# Patient Record
Sex: Female | Born: 1950
Health system: Southern US, Community
[De-identification: ages and names within clinical notes are randomized; demographics above are authoritative.]

## PROBLEM LIST (undated history)

## (undated) DIAGNOSIS — Z923 Personal history of irradiation: Secondary | ICD-10-CM

## (undated) DIAGNOSIS — F329 Major depressive disorder, single episode, unspecified: Secondary | ICD-10-CM

## (undated) DIAGNOSIS — E039 Hypothyroidism, unspecified: Secondary | ICD-10-CM

## (undated) DIAGNOSIS — R112 Nausea with vomiting, unspecified: Secondary | ICD-10-CM

## (undated) DIAGNOSIS — C801 Malignant (primary) neoplasm, unspecified: Secondary | ICD-10-CM

## (undated) DIAGNOSIS — F419 Anxiety disorder, unspecified: Secondary | ICD-10-CM

## (undated) DIAGNOSIS — Z8049 Family history of malignant neoplasm of other genital organs: Secondary | ICD-10-CM

## (undated) DIAGNOSIS — T8859XA Other complications of anesthesia, initial encounter: Secondary | ICD-10-CM

## (undated) DIAGNOSIS — K219 Gastro-esophageal reflux disease without esophagitis: Secondary | ICD-10-CM

## (undated) DIAGNOSIS — M1990 Unspecified osteoarthritis, unspecified site: Secondary | ICD-10-CM

## (undated) DIAGNOSIS — Z9889 Other specified postprocedural states: Secondary | ICD-10-CM

## (undated) DIAGNOSIS — F32A Depression, unspecified: Secondary | ICD-10-CM

## (undated) DIAGNOSIS — G5603 Carpal tunnel syndrome, bilateral upper limbs: Secondary | ICD-10-CM

## (undated) DIAGNOSIS — T4145XA Adverse effect of unspecified anesthetic, initial encounter: Secondary | ICD-10-CM

## (undated) HISTORY — DX: Family history of malignant neoplasm of other genital organs: Z80.49

## (undated) HISTORY — PX: DIAGNOSTIC LAPAROSCOPY: SUR761

## (undated) HISTORY — PX: WISDOM TOOTH EXTRACTION: SHX21

## (undated) HISTORY — PX: COLONOSCOPY: SHX174

## (undated) HISTORY — DX: Personal history of irradiation: Z92.3

## (undated) HISTORY — PX: TUBAL LIGATION: SHX77

## (undated) HISTORY — PX: DILATION AND CURETTAGE OF UTERUS: SHX78

## (undated) HISTORY — PX: KNEE SURGERY: SHX244

---

## 1997-10-06 ENCOUNTER — Encounter: Admission: RE | Admit: 1997-10-06 | Discharge: 1998-01-04 | Payer: Self-pay | Admitting: Family Medicine

## 1998-12-20 ENCOUNTER — Other Ambulatory Visit: Admission: RE | Admit: 1998-12-20 | Discharge: 1998-12-20 | Payer: Self-pay | Admitting: Family Medicine

## 2000-01-24 ENCOUNTER — Encounter: Payer: Self-pay | Admitting: Internal Medicine

## 2000-01-24 ENCOUNTER — Encounter: Admission: RE | Admit: 2000-01-24 | Discharge: 2000-01-24 | Payer: Self-pay | Admitting: Internal Medicine

## 2000-03-18 ENCOUNTER — Emergency Department (HOSPITAL_COMMUNITY): Admission: EM | Admit: 2000-03-18 | Discharge: 2000-03-18 | Payer: Self-pay | Admitting: Emergency Medicine

## 2000-04-04 ENCOUNTER — Ambulatory Visit (HOSPITAL_COMMUNITY): Admission: RE | Admit: 2000-04-04 | Discharge: 2000-04-04 | Payer: Self-pay | Admitting: Internal Medicine

## 2000-04-04 ENCOUNTER — Encounter: Payer: Self-pay | Admitting: Internal Medicine

## 2000-06-14 ENCOUNTER — Encounter (INDEPENDENT_AMBULATORY_CARE_PROVIDER_SITE_OTHER): Payer: Self-pay | Admitting: *Deleted

## 2000-06-14 ENCOUNTER — Other Ambulatory Visit: Admission: RE | Admit: 2000-06-14 | Discharge: 2000-06-14 | Payer: Self-pay | Admitting: Obstetrics and Gynecology

## 2000-06-19 ENCOUNTER — Ambulatory Visit (HOSPITAL_COMMUNITY): Admission: RE | Admit: 2000-06-19 | Discharge: 2000-06-19 | Payer: Self-pay | Admitting: Obstetrics and Gynecology

## 2000-06-19 ENCOUNTER — Encounter (INDEPENDENT_AMBULATORY_CARE_PROVIDER_SITE_OTHER): Payer: Self-pay

## 2000-07-11 ENCOUNTER — Encounter: Admission: RE | Admit: 2000-07-11 | Discharge: 2000-10-09 | Payer: Self-pay | Admitting: Obstetrics and Gynecology

## 2000-08-22 ENCOUNTER — Ambulatory Visit (HOSPITAL_COMMUNITY): Admission: RE | Admit: 2000-08-22 | Discharge: 2000-08-22 | Payer: Self-pay | Admitting: Neurosurgery

## 2000-08-22 ENCOUNTER — Encounter: Payer: Self-pay | Admitting: Neurosurgery

## 2001-10-30 ENCOUNTER — Other Ambulatory Visit: Admission: RE | Admit: 2001-10-30 | Discharge: 2001-10-30 | Payer: Self-pay | Admitting: Obstetrics and Gynecology

## 2001-11-04 ENCOUNTER — Encounter: Admission: RE | Admit: 2001-11-04 | Discharge: 2001-11-04 | Payer: Self-pay | Admitting: Obstetrics and Gynecology

## 2001-11-04 ENCOUNTER — Encounter: Payer: Self-pay | Admitting: Obstetrics and Gynecology

## 2002-06-04 ENCOUNTER — Encounter: Admission: RE | Admit: 2002-06-04 | Discharge: 2002-06-04 | Payer: Self-pay | Admitting: Internal Medicine

## 2002-06-04 ENCOUNTER — Encounter: Payer: Self-pay | Admitting: Internal Medicine

## 2003-06-09 ENCOUNTER — Emergency Department (HOSPITAL_COMMUNITY): Admission: EM | Admit: 2003-06-09 | Discharge: 2003-06-10 | Payer: Self-pay | Admitting: Emergency Medicine

## 2004-08-30 ENCOUNTER — Inpatient Hospital Stay (HOSPITAL_COMMUNITY): Admission: EM | Admit: 2004-08-30 | Discharge: 2004-08-31 | Payer: Self-pay | Admitting: Emergency Medicine

## 2005-04-15 ENCOUNTER — Inpatient Hospital Stay (HOSPITAL_COMMUNITY): Admission: AD | Admit: 2005-04-15 | Discharge: 2005-04-15 | Payer: Self-pay | Admitting: Obstetrics & Gynecology

## 2005-05-11 ENCOUNTER — Ambulatory Visit (HOSPITAL_COMMUNITY): Admission: RE | Admit: 2005-05-11 | Discharge: 2005-05-11 | Payer: Self-pay | Admitting: Obstetrics and Gynecology

## 2005-05-11 ENCOUNTER — Encounter (INDEPENDENT_AMBULATORY_CARE_PROVIDER_SITE_OTHER): Payer: Self-pay | Admitting: Specialist

## 2005-05-16 ENCOUNTER — Encounter: Admission: RE | Admit: 2005-05-16 | Discharge: 2005-05-16 | Payer: Self-pay | Admitting: Obstetrics and Gynecology

## 2005-06-27 ENCOUNTER — Other Ambulatory Visit: Admission: RE | Admit: 2005-06-27 | Discharge: 2005-06-27 | Payer: Self-pay | Admitting: Obstetrics and Gynecology

## 2005-09-15 ENCOUNTER — Inpatient Hospital Stay (HOSPITAL_COMMUNITY): Admission: EM | Admit: 2005-09-15 | Discharge: 2005-09-17 | Payer: Self-pay | Admitting: Emergency Medicine

## 2006-07-06 ENCOUNTER — Emergency Department (HOSPITAL_COMMUNITY): Admission: EM | Admit: 2006-07-06 | Discharge: 2006-07-06 | Payer: Self-pay | Admitting: Emergency Medicine

## 2006-08-12 ENCOUNTER — Encounter: Admission: RE | Admit: 2006-08-12 | Discharge: 2006-08-12 | Payer: Self-pay | Admitting: Internal Medicine

## 2007-11-12 ENCOUNTER — Encounter: Admission: RE | Admit: 2007-11-12 | Discharge: 2008-01-06 | Payer: Self-pay | Admitting: Sports Medicine

## 2007-12-09 ENCOUNTER — Ambulatory Visit (HOSPITAL_COMMUNITY): Admission: RE | Admit: 2007-12-09 | Discharge: 2007-12-09 | Payer: Self-pay | Admitting: Orthopaedic Surgery

## 2008-01-10 ENCOUNTER — Encounter: Admission: RE | Admit: 2008-01-10 | Discharge: 2008-01-10 | Payer: Self-pay | Admitting: Orthopaedic Surgery

## 2009-02-28 ENCOUNTER — Emergency Department (HOSPITAL_COMMUNITY): Admission: EM | Admit: 2009-02-28 | Discharge: 2009-02-28 | Payer: Self-pay | Admitting: Emergency Medicine

## 2009-04-20 ENCOUNTER — Other Ambulatory Visit: Admission: RE | Admit: 2009-04-20 | Discharge: 2009-04-20 | Payer: Self-pay | Admitting: Obstetrics and Gynecology

## 2010-04-16 ENCOUNTER — Encounter: Payer: Self-pay | Admitting: Orthopaedic Surgery

## 2010-08-11 NOTE — Consult Note (Signed)
NAME:  Ashley Villegas, Ashley Villegas NO.:  [id number]   MEDICAL RECORD NO.:  [id number]          PATIENT TYPE:  INP   LOCATION:  3701                         FACILITY:  [facility]   PHYSICIAN:  Meade Maw, M.D.    DATE OF BIRTH:  1950/06/07   DATE OF CONSULTATION:  08/30/2004  DATE OF DISCHARGE:                                   CONSULTATION   REFERRING PHYSICIAN:  Sharlet Salina, M.D.   REASON FOR CONSULTATION:  Chest pain.   HISTORY:  Ashley Villegas is a very pleasant 60 year old female who works at the  [employer].  While talking on the telephone she experienced a  left parasternal chest pain, described as a severe pain radiating to her  left shoulder and left neck.  The pain became persistent and severe, she had  no associated nausea or vomiting.  She called Dr. Jacqualine Code office who  recommended that she proceed to the emergency room.  Instead, she drove to  the Urgent [facility] at [facility] __________where she was given four aspirin  and one sublingual nitroglycerin.  The sublingual nitroglycerin resulted in  gradual improvement, and the total pain persisted for approximately 45  minutes.  She subsequently was transferred to the emergency room for further  evaluation.  Cardiac markers were negative, chest x-ray was negative.  It  was elected to admit the patient for further observation.  Since her  admission, she has had two further episodes of chest pain similar in nature,  but much milder in severity.  Of note, Ashley Villegas is noted to be active.  She  exercised on a treadmill for 30 minutes the day prior to her admission.  She  had no chest pain with this exertion.  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   PAST MEDICAL HISTORY:  1.  Hypothyroidism.  2.  Peripheral edema.  3.  Depression.   MEDICATIONS PRIOR TO ADMISSION:  1.  Synthroid 150 mcg daily.  2.  Effexor 150 mg daily.  3.  Hydrochlorothiazide 25 mg daily.   INPATIENT MEDICATIONS:  1.   Lopressor 25 mg daily.  2.  Lovenox 1 mg/kg.  3.  Effexor 150 mg daily.  4.  Sublingual nitroglycerin.  5.  Aspirin 325 mg daily.  6.  Hydrochlorothiazide 25 mg daily.  7.  Synthroid 200 mcg daily.   REVIEW OF SYSTEMS:  As noted above.  She does note that she does have shin  splints with excessive walking on her treadmill.   SOCIAL HISTORY:  She continues to smoke, social alcohol.  She is single.  Works at [employer] as noted above.   PHYSICAL EXAMINATION:  GENERAL:  A middle-aged female appearing older than  her stated age, currently in no pain.  VITAL SIGNS:  She is afebrile.  Blood pressure is 100/64, heart rate is 67,  weight is 160 pounds.  HEENT:  Unremarkable.  NECK:  No carotid bruits.  Good carotid upstrokes.  PULMONARY:  Breath sounds which are slightly diminished.  There is no  wheezing noted.  CARDIOVASCULAR:  Regular rate and rhythm, normal S1 and S2, no murmurs,  rubs, or gallops  noted.  PMI is not displaced.  ABDOMEN:  Soft, benign, nontender.  EXTREMITIES:  No peripheral edema.   LABORATORY DATA:  D-dimer's are less than 0.22.  Sodium is 136, potassium is  3.4, BUN is 9, creatinine is 0.8.  Normal liver enzymes.  Serial cardiac  enzymes were negative.  Her LDL is 188, triglycerides 696.  Her HDL is 42.  Her TSH is noted to be markedly elevated at 34.1.  Her serial ECG's were  reviewed.  She is noted to have a normal sinus rhythm.  There are  nonspecific ST changes noted.  There are no acute ST changes noted when the  patient is having active chest pain.  CT scan of the chest was performed.  There was no evidence of aortic dissection, there was no pulmonary embolus  noted.   IMPRESSION:  32.  A 60 year old female with significant risk factors for coronary artery      disease, including dyslipidemia, age, and chronic tobacco use.  Chest      pain is somewhat concerning for angina, in that it is possibly relieved      with nitroglycerin.  Further of note,  the patient has been involved in a      stressful exercise routine without exacerbation of her chest pain.  We      will proceed with a stress Cardiolite for further evaluation.  2.  Dyslipidemia.  In view of the patient's risk factors and age, would      shoot for a LDL of less than 130.  If she is found to have coronary      artery disease, of course, this goal will change.  Contributing to her      elevated LDL is probably her hypothyroidism.  3.  Hypothyroidism.  Agree with gradual increase in her Synthroid, but not      aggressively replacing until we have determined whether her chest pain      is related to angina.  4.  Tobacco abuse.  Smoking cessation was discussed at length with the      patient.       HP/MEDQ  D:  08/30/2004  T:  08/30/2004  Job:  [id number]

## 2010-08-11 NOTE — H&P (Signed)
NAME:  Ashley Villegas, Ashley Villegas      ACCOUNT NO.:  [id number]   MEDICAL RECORD NO.:  [id number]          PATIENT TYPE:  AMB   LOCATION:  SDC                           FACILITY:  [facility]   PHYSICIAN:  James A. Ashley Royalty, M.D.DATE OF BIRTH:  06-Apr-1950   DATE OF ADMISSION:  05/11/2005  DATE OF DISCHARGE:                                HISTORY & PHYSICAL   This is a 60 year old gravida 3, para 2, abortus 1, who was seen at my  office on April 16, 2005, for new patient examination.  She stated 5 days  ago she had a Pap done at work at [employer].  She began bleeding 1  day later and 2 days later she had bleeding considerably more than a normal  period.  She stated that she has had approximately 7 periods over the last  year.  She has not been sexually active in a number of months.  Examination  in my office on that day revealed the uterus to be approximately 10 x 7 x 6  cm, nodular, and no adnexal masses were palpable.  The patient subsequently  returned for ultrasound which reveals small uterine fibroids, the largest of  which was 1.9 cm in greatest diameter.  The adnexa were not well seen.  Sonohysterogram was performed as well and revealed a 1.2 cm echogenic focus.  It could not be determined whether the focus represented a polyp or possibly  a clot.  The examination was done with a regular pipette as well as with a  balloon pipette.  The patient was sent for diagnostic/operative hysteroscopy  and dilatation and curettage.   MEDICATIONS:  1.  Fluoxetine 20 mg daily.  2.  Synthroid 112 mcg daily.  3.  Lipitor 10 mg daily.  4.  Triamterene/hydrochlorothiazide 37.5 mg twice daily.  5.  Amitriptyline 50 mg daily.   PAST MEDICAL HISTORY:  Depression, hypothyroidism.   PAST SURGICAL HISTORY:  Cesarean section, laparoscopy x3, exploratory  laparotomy for ovarian cyst in the 1970's.   ALLERGIES:  No known drug allergies.   FAMILY HISTORY:  Noncontributory.   SOCIAL HISTORY:  The  patient denies the use of tobacco or significant  alcohol.   REVIEW OF SYSTEMS:  Noncontributory.   PHYSICAL EXAMINATION:  GENERAL:  A well-developed, well-nourished, pleasant,  white female in no acute distress.  VITAL SIGNS:  Afebrile, vital signs stable.  SKIN:  Warm and dry without lesions.  LYMPH:  There is no supraclavicular, cervical, or inguinal adenopathy.  HEENT:  Normocephalic.  NECK:  Supple without thyromegaly.  CHEST:  Lungs are clear.  HEART:  Regular rate and rhythm without murmurs, rubs, or gallops.  BREASTS:  Soft and nontender without masses.  ABDOMEN:  Soft and nontender without masses or organomegaly.  Bowel sounds  are active.  MUSCULOSKELETAL:  Full range of motion.  PELVIC:  External genitalia within normal limits.  Vagina and cervix without  gross lesions.  Bimanual examination revealed the uterus to be as mentioned  above.  Rectovaginal examination confirms.   IMPRESSION:  1.  __________ .  2.  History of depression.  3.  Hypothyroidism.  4.  Status post  cesarean section.  5.  Status post laparoscopy x3.  6.  Status post exploratory laparotomy for right ovarian cyst.  7.  Echogenic focus within the uterine cavity - differential includes polyp,      fibroid, or possibly clot.  8.  Abnormal uterine bleeding - possibly secondary to #6.  9.  Status post bilateral tubal ligation procedure.   PLAN:  Diagnostic/operative hysteroscopy, dilatation and curettage.  Risks,  benefits, complications, and alternatives were fully discussed with the  patient.  She states that she understands and accepts.  Questions invited  and answered.      James A. Ashley Royalty, M.D.  Electronically Signed     JAM/MEDQ  D:  05/11/2005  T:  05/11/2005  Job:  [id number]

## 2010-08-11 NOTE — Consult Note (Signed)
NAME:  Ashley Villegas, Ashley Villegas      ACCOUNT NO.:  [id number]   MEDICAL RECORD NO.:  [id number]            PATIENT TYPE:   LOCATION:                                 FACILITY:   PHYSICIAN:  Francisca December, M.D.       DATE OF BIRTH:   DATE OF CONSULTATION:  09/16/2005  DATE OF DISCHARGE:                                   CONSULTATION   CARDIAC CONSULTATION   REASON FOR CONSULTATION:  Chest pain.   HISTORY OF PRESENT ILLNESS:  Ms. Ashley Villegas is a 59 year old woman with  no known history of CAD who developed acute substernal chest pain followed  by diaphoresis and nausea which lasted approximately 30 minutes; this  occurred at work after a disturbing following call.  The patient was rushed  to [facility] and was pain free in the emergency room with aspirin; and  has had no recurrence.   She was hospitalized June of 2006 with similar chest discomfort. Cardiolite,  then, was negative, CT scan was negative for PE.  The pain was described as  a pressure and tightness that did not radiate.  It had the associated  symptoms as mentioned above.  Of note, she exercises regularly without chest  discomfort.   REVIEW OF SYSTEMS:  She is hypothyroid with recent adequate regulation.  She  is concerned about diffuse swelling and burning in her hands.  She does take  Lasix regularly for this.   SOCIAL HISTORY:  No tobacco or ethanol.  No illicit drug use.  She did  previously use tobacco, none currently.  She exercises regularly with her  daughter at the gym and works at [employer].   FAMILY HISTORY:  Mother died in her 12s of CHF.  Father is alive and well.   DRUG ALLERGIES:  None known.   CURRENT MEDICATIONS:  1.  Paxil 20 mg p.o. daily.  2.  Synthroid 0.112 mg.  3.  Furosemide 20 mg p.o. b.i.d.  4.  Lipitor 10 mg.  5.  Elavil 50 mg p.o. daily.  6.  Centrum and vitamin B daily.   PAST MEDICAL HISTORY:  1.  Hypothyroidism.  2.  Hyperlipidemia.  3.  Intermittent anxiety.  4.  Hypertension.  5.  Remote history of C-section.  6.  Former tobacco use.  7.  Depression.   PHYSICAL EXAMINATION:  VITAL SIGNS:  Blood pressure 124/70, pulse 71,  respiratory rate 18, temperature 98.6, 95% saturation on room air.  GENERAL:  This is a pleasant 60 year old woman, mildly obese.  No distress.  HEENT:  Unremarkable.  NECK:  Supple without thyromegaly or masses.  The carotid upstrokes are  normal.  There is no bruit.  There is no JVD. CHEST:  Her chest is clear  with adequate excursion bilaterally.  HEART:  The heart has regular rhythm.  Normal S1-S2.  The chest wall is  nontender.  ABDOMEN:  Soft and nontender without hepatosplenomegaly or midline pulsatile  mass.  Bowel sounds are present throughout.  EXTERNAL GENITALIA:  Without lesions.  RECTAL:  Not performed.  EXTREMITIES:  Show full range of motion.  No edema.  Intact distal  pulses.  NEUROLOGICAL:  Cranial nerves II-XII intact.  Motor and sensory grossly  intact.  Gait not tested.  SKIN:  Warm, dry and clear.   ACCESSORY CLINICAL DATA:  LDL cholesterol of 97.  Initial potassium was 2.8  now repleted to 3.1.  Admission hemogram is normal.  Renal function is  normal.  D-dimer is less than 0.22.  CK/MB and troponin have been negative  x3.   ELECTROCARDIOGRAM:  Normal sinus rhythm and normal.   CHEST X-RAY:  No active cardiopulmonary disease.   ASSESSMENT:  1.  A single episode of chest pain associated with emotional stress, none on      exertion.  2.  Hypothyroidism.  3.  Hyperlipidemia well-controlled.  4.  Hypertension.  5.  Anxiety, chronic.  6.  Long-term medication use.  7.  Arthralgias and diffuse edema, etiology uncertain.  8.  Hypokalemia, diuretic induced.   PLAN:  1.  Repeat potassium as you are doing.  2.  Aspirin daily.  3.  Schedule for diagnostic study, cardiac catheterization versus exercise      Myoview.  Since she had a normal study 1 year ago, consideration for      catheterization  may be reasonable.  Will leave this up to Dr. Fraser Din.      She will return tomorrow.  Will keep her n.p.o. and tentatively schedule      a Myoview at this point.      Francisca December, M.D.  Electronically Signed     JHE/MEDQ  D:  09/16/2005  T:  09/17/2005  Job:  [id number]   cc:   Sharlet Salina, M.D.  Fax: [phone]

## 2010-08-11 NOTE — Discharge Summary (Signed)
NAME:  Ashley Villegas, Ashley Villegas      ACCOUNT NO.:  [id number]   MEDICAL RECORD NO.:  [id number]          PATIENT TYPE:  INP   LOCATION:  3701                         FACILITY:  [facility]   PHYSICIAN:  Deirdre Peer. Polite, M.D. DATE OF BIRTH:  [birth year]   DATE OF ADMISSION:  08/29/2004  DATE OF DISCHARGE:  08/31/2004                                 DISCHARGE SUMMARY   DISCHARGE DIAGNOSIS:  1.  Chest pain, cardiac enzymes negative for ischemia, CT negative for      pulmonary embolus, did show mild peribronchial thickening, Cardiolite      stress test ejection fraction within normal limits, no ischemia.  2.  Hypothyroidism, please note TSH is elevated, patient states compliance      to medications, therefore, suggesting may need increase in her      medications, Synthroid increased to 200 mcg.  3.  History of depression.  4.  Tobacco use, recommend stop.  5.  Hypercholesterolemia.   DISCHARGE MEDICATIONS:  The patient is asked to continue her home  medications which includes Effexor, hydrochlorothiazide 25 mg, Synthroid  increased to 200 mcg.   DISPOSITION:  The patient is being discharged to home in stable condition,  asked to follow up with primary MD in approximately one week.   CONSULTATIONS:  [facility].   STUDIES:  CT of the chest negative for PE.  EKG revealed normal sinus  rhythm, low voltage, no evidence of ischemia.  CBC within normal limits.  Lipid panel with cholesterol 271, LDL 191, triglycerides 176.  Cardiolite  stress test was reported without ischemia.  Chest x-ray showed no active  disease.  TSH 34.   DISCHARGE MEDICATIONS:  Lipitor 10 mg p.o. daily.   HISTORY OF PRESENT ILLNESS:  60 year old female with above medical problem  experienced chest pain at work.  The patient was seen at [facility],  given nitroglycerin, reportedly did have some relief of her chest pain, the  patient was sent to the ED for further evaluation.  In the ED, the patient  was  evaluated, admission was deemed necessary for further evaluation and  treatment.  Please note the patient had EKG and point of care enzymes within  normal limits.  Please see dictated H&P for further details.   PAST MEDICAL HISTORY:  As stated above.   MEDICATIONS:  Stated on admission H&P.   SOCIAL HISTORY:  Per admission H&P.   PAST SURGICAL HISTORY:  None.   HOSPITAL COURSE:  The patient was admitted to a telemetry bed for evaluation  and treatment of chest pain.  The patient did admit to having some relief  after receiving sublingual nitroglycerin.  The patient was placed on  aspirin, beta blocker, as well as Lovenox.  A cardiology consultation was  obtained.  In the interim, the patient had CT scan of the chest which was  negative for PE.  Also, the patient's EKG was without acute abnormalities.  Serial enzymes were ordered and were negative for ischemia.  The patient  ultimately underwent Cardiolite stress test reported as normal without signs  of ischemia.  At this time, the patient is pain free without symptoms and is  stable for discharge.  As stated, further testing has been ordered.  The  patient has a TSH that is elevated, Synthroid has been increased to 200 mcg.  She is asked to follow up with her primary MD for further monitoring and  adjustment of medicines.  The patient also had lipids ordered that showed  elevated cholesterol, the patient will be started on Lipitor.  The patient  is asked to follow up with primary MD for further management of newly  diagnosed hypercholesterolemia.  The patient has been counseled on her need  to stop smoking and, again, recommends further outpatient follow up.  At  this time, the patient is medically stable for discharge.       RDP/MEDQ  D:  08/31/2004  T:  08/31/2004  Job:  [id number]

## 2010-08-11 NOTE — H&P (Signed)
NAME:  Ashley Villegas, Ashley Villegas      ACCOUNT NO.:  [id number]   MEDICAL RECORD NO.:  [id number]           PATIENT TYPE:   LOCATION:                               FACILITY:  [facility]   PHYSICIAN:  Michelene Gardener, MD         DATE OF BIRTH:   DATE OF ADMISSION:  09/15/2005  DATE OF DISCHARGE:                                HISTORY & PHYSICAL   PRIMARY CARE PHYSICIAN:  The patient is normally followed at the [facility]  group.   CHIEF COMPLAINT:  Chest pain.   HISTORY OF PRESENT ILLNESS:  This is a 60 year old female with past medical  history of multiple problems who presented with the above mentioned  complaint.  The patient stated that she was at work today when she started  developing left-sided chest pain described as pressure-like, was 10/10,  radiating to her left shoulder and associated with nausea and sweating, mild  shortness of breath.  This lasted around 25-30 minutes and then resolved by  itself when she was already in the ER.  In the ER she is chest pain free.  The patient stated that she had too many episodes of chest pain which last  between 10-15 minutes and they resolve by themselves every time.  She had  stress test done around one year ago and came to be normal.  Currently she  is chest pain free.   PAST MEDICAL HISTORY:  1. Significant for hypertension.  2. Hypothyroidism.  3. Hypercholesterolemia.  4. Anxiety.  5. Anemia.   PAST SURGICAL HISTORY:  History of cesarean section.   MEDICATIONS:  1. Fluoxetine 20 mg p.o. once daily.  2. Synthroid 112 mcg p.o. once daily.  3. Triamterene/hydrochlorothiazide 7.5/25 mg once daily.  4. Lipitor 10 mg p.o. once daily.  5. Amitriptyline 50 mg p.o. once daily.  6. Vitamin B12.  7. Lasix 20 mg p.o. twice daily.   ALLERGIES:  No known drug allergies.   SOCIAL HISTORY:  The patient denies smoking, denies alcohol drinking, and  denies history of recreational drugs.   FAMILY HISTORY:  Her mother died at the age of 46 with  congestive heart  failure and her father is still alive and he had only history of phlebitis.   REVIEW OF SYSTEMS:  Positive for shortness of breath, chest pain, nausea,  and sweating.  The rest of the systems were reviewed and they were negative.   PHYSICAL EXAMINATION:  VITAL SIGNS:  Temperature is 97.8, blood pressure is  115/76, pulse 74, respiratory rate 20.  GENERAL APPEARANCE:  This is a middle-aged, Caucasian female, laying down in  bed, no signs of acute distress at the present time.  HEENT:  Conjunctivae are normal.  There is no bilateral erythema.  Pupils  are equal, round, and reactive to light and accommodation.  There is no  ptosis.  Hearing is intact.  There is no ear discharge or infection.  There  is no nose discharge, infection, or  bleeding.  Oral mucosa is moist and  there is no pharyngeal erythema.  NECK:  Supple.  No JVD, no carotid bruit.  No lymphadenopathy seen.  No  thyroid enlargement or thyroid tenderness.  CARDIOVASCULAR:  S1/S2 are heard.  No additional heart sounds.  No murmurs,  no gallops, and no thrills.  RESPIRATORY:  The patient is breathing between 16-18.  No use of accessory  muscles found.  No intercostal retractions.  No numbness.  No rales or  rhonchi.  No wheezes.  ABDOMEN:  Soft, nondistended.  No tenderness.  No hepatosplenomegaly.  Bowel  sounds are normal.  Umbilicus is central.  EXTREMITIES:  Lower extremities:  No rash, and no varicose veins.  There is positive edema which is +1.  SKIN:  No rash, no erythema.  NEUROLOGIC:  Cranial nerves are intact for II-XII.  No motor or sensory  deficits.   LABORATORY DATA:  WBC 7.3, hemoglobin 15.3, hematocrit 38.4, MCV is 91.8,  platelet count is 325.  Sodium 139, potassium 2.8, chloride 101, BUN 11,  glucose 103.   EKG showed no signs of acute ischemia.   ASSESSMENT:  1. Stable angina.  This patient has recurrent chest pain that increases      with exertion and normally resolves by rest.   Will admit her to      telemetry.  To get three sets of troponin and cardiac enzymes.  Will      also follow serial EKGs.  Will start on aspirin, beta blockers, ACE      inhibitors, nitroglycerin, and Lovenox.  Will also get echocardiogram      because of because of chronic lower extremity edema for which she is      taking Lasix but never diagnosed with congestive heart failure.  We      will get cardiology consultation for further evaluation.  2. Hypokalemia.  Will give [facility] and order baseline basic metabolic      panel.  3. Hypertension.  Will continue her current medications.  4. Hypercholesterolemia.  Will continue her current medications and will      get lipid profile.  5. Anxiety.  I will continue her current medications.  6. Hypothyroidism.  Will continue her Synthroid and will get TSH level.      Michelene Gardener, MD  Electronically Signed     NAE/MEDQ  D:  09/15/2005  T:  09/16/2005  Job:  [id number]

## 2010-08-11 NOTE — H&P (Signed)
NAME:  Ashley Villegas      ACCOUNT NO.:  [id number]   MEDICAL RECORD NO.:  [id number]          PATIENT TYPE:  INP   LOCATION:                               FACILITY:  [facility]   PHYSICIAN:  Sherin Quarry, MD      DATE OF BIRTH:  1950-07-31   DATE OF ADMISSION:  08/30/2004  DATE OF DISCHARGE:                                HISTORY & PHYSICAL   HISTORY OF THE PRESENT ILLNESS:  Ashley Villegas is a 60 year old  lady who states that she was at work at the [employer] at  about 3 P.M. this afternoon when while talking on the phone she began to  experience a left parasternal chest pain described as a severe ache, which  seemed to radiate to her shoulder.  She stood up, but the pain did not seem  to improve.  She waited a while and did not noticed any breathing  difficulty, sweating, nausea or vomiting. Eventually, when the pain did not  improve she got into her car and drove to the new [facility] where she was given four aspirins and one sublingual nitroglycerin.  After she got the sublingual nitroglycerin she noticed a gradual improvement  in the chest pain.  She estimates that the pain lasted about 45 minutes.  She was urged to come to the emergency room for further evaluation.   On arrival to the emergency room an electrocardiogram was obtained, which  was within normal limits.  Chest x-ray was normal and point of care cardiac  enzymes were negative.  After evaluating her the decision was made to admit  her at this time for further investigation of the prolonged episode of chest  pain.   MEDICATIONS:  1.  Synthroid 150 mcg daily.  2.  Effexor 150 mg daily.  3.  Hydrochlorothiazide 25 mg daily.  4.  The patient also states she takes a medicine at night for her      circulation; she is not sure what this is.   ALLERGIES:  The patient states there are no known drug allergies.   PAST MEDICAL HISTORY:  Medical illnesses include:  1.  In  the 1980s she was diagnosed with hypothyroidism and has taken      Synthroid since that time.  Dr. Marny Lowenstein periodically monitors her TSH.      She has had no recent problems with heat or cold intolerance, skin or      hair changes.  2.  Mild peripheral edema.  The patient states she takes hydrochlorothiazide      25 mg daily, she thinks this is not because of hypertension.  3.  Depression.  The patient is on Effexor chronically for these symptoms.      She apparently has some difficulty with insomnia.   PAST SURGICAL HISTORY:  Operations are none, except for routine childbirth.   FAMILY HISTORY:  The patient's mother died of congestive heart failure at  age 106.  Her father has a history of hypertension and phlebitis.  One of her  brothers apparently has celiac SPRUE.   SOCIAL HISTORY:  The patient smoked about  1/2 pack of cigarettes per day  until a few months ago.  She had not drank alcohol or used drugs.  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   REVIEW OF SYSTEMS:  HEENT:  Head; she has had a throbbing head today.  Ears,  nose and throat; denies earaches, sinus pain or sore throat.  RESPIRATORY:  Denies coughing, wheezing or chest congestion.  CARDIOVASCULAR: Denies  orthopnea, PND and ankle edema.  GASTROINTESTINAL:  Denies nausea, vomiting,  indigestion or dysphagia.  GENITOURINARY:  Denies dysuria and urinary  frequency.  NEUROLOGIC:  No history of seizure or stroke. ENDOCRINE:  Denies  excessive thirst, urinary frequency and nocturia.   PHYSICAL EXAMINATION:  GENERAL APPEARANCE:  On physical exam she is an alert  and cooperative lady who is currently in no acute distress.  VITAL SIGNS:  The patient's blood pressure is 112/65, pulse 75 and  respirations 20.  O2 saturation 98%.  HEENT:  The head, eyes, ears, nose and throat exam is within normal limits.  CHEST:  The chest is clear.  BACK:  Examination of the back reveals no CVA or  point tenderness.  HEART:  Cardiovascular exam reveals normal S1 and S2 without rubs, murmurs  or gallops.  ABDOMEN:  The abdomen is benign.  On deep palpation in the epigastric and  right upper quadrant areas produces no tenderness.  NEUROLOGIC EXAMINATION:  On neurologic testing examination of the  extremities is normal.   IMPRESSION:  1.  Prolonged episode of chest pain.  Risk factors include smoking history. I am not sure about the status of her  cholesterol level.  There is some family history of heart disease, although  at an advanced age.  It seems prudent to me to admit this patient for  further evaluation.   We will obtain serial enzymes and cardiology evaluation.  I am also going to  obtain a computerized tomographic scan of the chest.   1.  Twenty pack/year smoking history.  2.  Hypothyroidism.  3.  Anxiety.       ___________________________________________  Sherin Quarry, MD    SY/MEDQ  D:  08/29/2004  T:  08/30/2004  Job:  [id number]   cc:   Sharlet Salina, M.D.  [address]  [place]  [place] [address]  Fax: [phone]

## 2010-08-11 NOTE — Op Note (Signed)
NAME:  Ashley Villegas, Ashley Villegas      ACCOUNT NO.:  [id number]   MEDICAL RECORD NO.:  [id number]          PATIENT TYPE:  AMB   LOCATION:  SDC                           FACILITY:  [facility]   PHYSICIAN:  James A. Ashley Royalty, M.D.DATE OF BIRTH:  [birth year]   DATE OF PROCEDURE:  05/11/2005  DATE OF DISCHARGE:                                 OPERATIVE REPORT   PREOPERATIVE DIAGNOSES:  1.  Possible endometrial polyp versus fibroid.  2.  Abnormal uterine bleeding.   POSTOPERATIVE DIAGNOSES:  1.  Possible endometrial polyp versus fibroid.  2.  Abnormal uterine bleeding.  3.  Pathology pending.   PROCEDURES:  1.  Diagnosis/operative hysteroscopy.  2.  Dilatation and curettage.   SURGEON:  Rudy Jew. Ashley Royalty, M.D..   ANESTHESIA:  General.   ESTIMATED BLOOD LOSS:  Less than 25 mL.   COMPLICATIONS:  None.   PACKS AND DRAINS:  None.   PROCEDURE:  The patient was taken to the operating room and placed in the  dorsal supine position.  After general anesthesia was administered, she was  placed in the lithotomy position and  prepped and draped in the usual manner  for vaginal surgery.  A posterior weighted retractor was placed per vagina.  The anterior lip of the cervix was grasped with single-tooth tenaculum.  The  uterus was sounded to 9 cm and noted to be anteverted.  The cervix was then  serially dilated to a size 29-French using Shawnie Pons dilators.  The resectoscope  was then placed into the uterine cavity using sorbitol as a distension  medium.  The endometrial cavity was thoroughly inspected.  The left and  right tubal ostia were identified and appropriately photographed.   The anterior and posterior surfaces of the uterine cavity were thoroughly  inspected.  There was a small polyp arising from the sidewall, which was  removed with the resectoscope using 50 watts, cutting waveform.  It was  submitted separately to pathology for histologic studies.  It could not be  ascertained with certainty  whether it was a strip of endometrium or whether  was a bona fide polyp.  I could appreciate no other submucosal fibroids or  separate polyps.   Attention was then to turned the uterine curettage.  The resectoscope was  removed and a medium-size curette introduced.  First a four-quadrant  curettage was performed.  Then a therapeutic curettage was performed.  All  curettings were submitted pathology for histologic studies.  The vaginal  instruments were removed and hemostasis was noted.  The procedure was  terminated.   The patient was then taken to the recovery room in excellent condition.      James A. Ashley Royalty, M.D.  Electronically Signed     JAM/MEDQ  D:  05/11/2005  T:  05/12/2005  Job:  [id number]

## 2010-08-11 NOTE — Discharge Summary (Signed)
NAME:  Ashley, Villegas NO.:  [id number]   MEDICAL RECORD NO.:  [id number]          PATIENT TYPE:  INP   LOCATION:  4731                         FACILITY:  [facility]   PHYSICIAN:  Sherin Quarry, MD      DATE OF BIRTH:  06-03-1950   DATE OF ADMISSION:  09/15/2005  DATE OF DISCHARGE:  09/17/2005                                 DISCHARGE SUMMARY   Ashley Villegas is a 60 year old lady who was initially seen in [facility] emergency room on June 23 by Dr. Arthor Captain.  At that time the  patient reported that while at work she began to experience left-sided chest  pain described as a pressure sensation, which seemed to radiate to the left  shoulder and was associated with nausea, diaphoresis and mild dyspnea.  The  pain lasted 25-30 minutes and then gradually resolved.  While being  evaluated in the emergency room, she experienced resolution of pain.  The  patient has a past history of recurrent episodes of chest pain and has had a  stress test the past that was done 1 year ago.   PAST MEDICAL HISTORY:  1.  History of hypertension.  2.  Hypothyroidism.  3.  Hyperlipidemia.   PHYSICAL EXAMINATION:  Physical exam at time of admission as described by  Dr. Arthor Captain:   VITAL SIGNS:  The blood pressure was 115/76, pulse 74, respirations 20.  HEENT:  Within normal limits.  CHEST:  Clear.  There was no wheezing or rales.  No rhonchi.  CARDIOVASCULAR:  Normal S1 and S2 without rubs, murmurs or gallops.  ABDOMEN:  Benign.  There are normal bowel sounds.  There were no masses or  tenderness.  No guarding or rebound.  NEUROLOGIC:  Neurologic testing was within normal limits.  EXTREMITIES:  1+ pretibial edema.   Relevant laboratory studies obtained included a D-dimer which was negative.  CBC showed a white count of 7300, hemoglobin 13.2.  The sodium was 139,  potassium was 2.8 consistent with her history of diuretic use.  Glucose was  103, BUN was 11.  Serial cardiac  markers were negative.  Lipid profile  showed a cholesterol of 171 with an LDL of 97, HDL of 37.  TSH was normal.   On admission a consultation was obtained from Dr. Amil Amen of [facility].  Dr. Amil Amen recommended proceeding with an exercise Cardiolite  study.  This was done on June 25 and the Cardiolite study was completely  normal, showing an ejection fraction estimated to be about 72% with no  ischemic abnormalities.  On admission the patient was started on potassium  replacement and she was given K-Dur at a dose of 20 mEq t.i.d.  The  potassium level was 3.1 as of June 25.  On June 25 the patient was  discharged.   DISCHARGE DIAGNOSES:  1.  Noncardiac chest pain.  2.  Hypokalemia.  3.  Hypertension.  4.  Hyperlipidemia.  5.  Hypothyroidism.   DISCHARGE MEDICATIONS:  The patient will continue her usual home medicines,  which consist of:   1.  Synthroid 112 mcg daily.  2.  Lasix 20 mg  daily.  3.  Dyazide 37.5 mg daily.  4.  Lipitor 10 mg daily.  5.  Amitriptyline 50 mg daily.  6.  Centrum and vitamin B one tablet daily.  7.  In addition, the patient will take a potassium supplement in the form of      K-Dur 20 mEq b.i.d.   The patient was advised to contact her primary doctor for follow-up  electrolyte determinations in 7-10 days.  She indicated that she would not  be seeing Dr. Marny Lowenstein in the future because Dr. Marny Lowenstein is leaving the  practice, and therefore she plans to see Dr. Olena Leatherwood at [facility] at [facility].  I told her thought that this would be a good plan  and encouraged her to see Dr. Olena Leatherwood in that time interval for follow-up  potassium determination.           ______________________________  Sherin Quarry, MD     SY/MEDQ  D:  09/17/2005  T:  09/18/2005  Job:  [id number]   cc:   Meade Maw, M.D.  Fax: [phone]   Sharlet Salina, M.D.  Fax: [phone]   Ladell Pier, M.D.  Fax: [phone]

## 2010-08-11 NOTE — H&P (Signed)
[facility]  Patient:    Ashley Villegas, Ashley Villegas)      MRN: [id number] Attending:  Rudy Jew. Ashley Royalty, M.D.                         History and Physical                                Patient is a 60 year old gravida 3, para 2, AB1 referred through the courtesy of Dr. Raechel Chute at [facility] for evaluation of abnormal uterine bleeding.  She has been having abnormal uterine bleeding and intramenstrual bleeding since the fall of 2001. Sonohysterogram and endometrial biopsy were performed at my office on June 14, 2000.  The sonohysterogram revealed a fibroid uterus and a 3.8 cm intrauterine mass, presumably a polyp versus fibroid.  Endometrial biopsy revealed benign secretory endometrium.  Patient is hence for diagnostic/operative hysteroscopy and dilatation and curettage.  MEDICATIONS:                  1. Synthroid 0.125 mg q.d.                               2. Efferon (antidepressant).  PAST MEDICAL HISTORY:         1. Depression treated by Elna Breslow, M.D.                               2. Hypothyroidism.  PAST SURGICAL HISTORY:        Cesarean section, exploratory laparotomy for right ovarian cyst (1970s), laparoscopy x 3 without documented evidence of endometriosis.  ALLERGIES:                    None.  FAMILY HISTORY:               Noncontributory.  SOCIAL HISTORY:               Patient denies use of tobacco or significant alcohol.  REVIEW OF SYSTEMS:            Noncontributory.  PHYSICAL EXAMINATION  GENERAL:                      Well-developed, well-nourished, pleasant white female in no acute distress.  VITAL SIGNS:                  Afebrile.  Vital signs stable.  SKIN:                         Warm and dry without lesions.  LYMPH:                        There is no supraclavicular, cervical, or inguinal adenopathy.  HEENT:                        Normocephalic.  NECK:                          Supple without thyromegaly.  CHEST:                        Lungs are  clear.  CARDIAC:                      Regular rate and rhythm without murmurs, gallops, or rubs.  BREASTS:                      Deferred.  ABDOMEN:                      Soft, nontender without mass or organomegaly. Bowel sounds are active.  MUSCULOSKELETAL:              No CVA tenderness.  PELVIC:                       Deferred until examination under anesthesia.  IMPRESSION:                   1. Intrauterine polyp versus fibroid on                                  sonohysterogram.                               2. Abnormal uterine bleeding probably secondary                                  to #1.                               3. Status post BTSP.                               4. Hypothyroidism.                               5. History of depression.  PLAN:                         1. Diagnostic/operative hysteroscopy.                               2. Dilatation and curettage.  Risks, benefits,                                  complications, and alternatives fully                                  discussed with the patient.  She states she                                  understands and accepts.  Questions invited                                  and answered.  Will schedule. DD:  06/19/00 TD:  06/19/00 Job: [id number]

## 2010-08-11 NOTE — Op Note (Signed)
[facility]  Patient:    Ashley Villegas, Ashley Villegas             MRN: [id number] Proc. Date: 06/19/00 Adm. Date:  [id number] Attending:  Wandalee Ferdinand                           Operative Report  PREOPERATIVE DIAGNOSIS:       1. Intrauterine polyp versus fibroid at                                  sonohistogram.                               2. Abnormal uterine bleeding.  POSTOPERATIVE DIAGNOSIS:      1. No gross polyp or fibroid noticed at                                  hysteroscopy.                               2. Pathology pending.  OPERATION:                    1. Diagnostic/operative hysteroscopy.                               2. Dilatation and curettage.  SURGEON:                      Rudy Jew. Ashley Royalty, M.D.  ANESTHESIA:                   General.  ESTIMATED BLOOD LOSS:         50 cc.  COMPLICATIONS:                None.  PACKS AND DRAINS:             None.  DESCRIPTION OF PROCEDURE:     The patient was taken to the operating room and placed in the dorsal supine position.  After adequate general anesthesia was administered, she was placed in the lithotomy position and prepped and draped in the usual manner for vaginal surgery.  Posterior weighted retractor was placed per vagina, and the lip of the cervix grasped with a single-tooth tenaculum.  The uterus was gently sounded to approximately 9 cm and noted to be anteverted.  The cervix was then serially dilated to a size 29 [place] using [employer] dilators.  The resectoscope was placed into the uterine cavity using sorbitol as a distention medium.  The endometrial cavity was carefully surveyed.  The endometrial lining appeared to be rather thick, and both tubal ostia were seen and photographed appropriately.  Other than the endometrial lining being rather lush, I could appreciate no significant size mass in the uterine cavity which was in contrast to the findings at sonohistogram.   There was a small apparent polyp versus strip of pedunculated endometrium that was removed separately and submitted to pathology for histologic studies. Appropriate photos were obtained.  At this point, attention was turned to the uterine curettage.  A four-quadrant technique was used.  At  the end, a therapeutic technique was used.  A large amount of tissue was submitted to pathology for histologic studies.  The hysteroscope was once again placed into the uterine cavity.  There were no other abnormalities noted.  Hemostasis was noted to be good.  At this point, the vaginal instruments were removed.  Hemostasis was noted and the procedure terminated.  It should be mentioned that, while the patient was under anesthesia and approximately two-thirds of the way through the case, I was informed that the type and screen reported on the patient was not valid and, if we wanted another, it would need to be redrawn.  Since the case was essentially two-thirds completed, I decided against ordering another type and screen, and at the conclusion of the procedure, hemostasis was excellent.  The patient was taken to the recovery room in excellent condition. DD:  06/19/00 TD:  06/20/00 Job: [id number]

## 2010-12-18 ENCOUNTER — Other Ambulatory Visit: Payer: Self-pay | Admitting: Internal Medicine

## 2010-12-18 DIAGNOSIS — Z1231 Encounter for screening mammogram for malignant neoplasm of breast: Secondary | ICD-10-CM

## 2010-12-22 ENCOUNTER — Ambulatory Visit
Admission: RE | Admit: 2010-12-22 | Discharge: 2010-12-22 | Disposition: A | Discharge status: Home / Self Care | Payer: Managed Care, Other (non HMO) | Source: Ambulatory Visit | Attending: Internal Medicine | Admitting: Internal Medicine

## 2010-12-22 ENCOUNTER — Other Ambulatory Visit: Payer: Self-pay | Admitting: Internal Medicine

## 2010-12-22 DIAGNOSIS — M79629 Pain in unspecified upper arm: Secondary | ICD-10-CM

## 2010-12-22 DIAGNOSIS — Z1231 Encounter for screening mammogram for malignant neoplasm of breast: Secondary | ICD-10-CM

## 2011-01-08 ENCOUNTER — Other Ambulatory Visit: Payer: Self-pay | Admitting: Internal Medicine

## 2011-01-08 ENCOUNTER — Ambulatory Visit
Admission: RE | Admit: 2011-01-08 | Discharge: 2011-01-08 | Disposition: A | Discharge status: Home / Self Care | Payer: Managed Care, Other (non HMO) | Source: Ambulatory Visit | Attending: Internal Medicine | Admitting: Internal Medicine

## 2011-01-08 DIAGNOSIS — M79629 Pain in unspecified upper arm: Secondary | ICD-10-CM

## 2013-01-12 ENCOUNTER — Other Ambulatory Visit (HOSPITAL_COMMUNITY)
Admission: RE | Admit: 2013-01-12 | Discharge: 2013-01-12 | Disposition: A | Discharge status: Home / Self Care | Payer: Managed Care, Other (non HMO) | Source: Ambulatory Visit | Attending: Internal Medicine | Admitting: Internal Medicine

## 2013-01-12 ENCOUNTER — Other Ambulatory Visit: Payer: Self-pay | Admitting: Internal Medicine

## 2013-01-12 DIAGNOSIS — Z1151 Encounter for screening for human papillomavirus (HPV): Secondary | ICD-10-CM | POA: Insufficient documentation

## 2013-01-12 DIAGNOSIS — Z01419 Encounter for gynecological examination (general) (routine) without abnormal findings: Secondary | ICD-10-CM | POA: Insufficient documentation

## 2013-10-19 ENCOUNTER — Other Ambulatory Visit: Payer: Self-pay

## 2013-10-19 DIAGNOSIS — Z1231 Encounter for screening mammogram for malignant neoplasm of breast: Secondary | ICD-10-CM

## 2013-10-23 ENCOUNTER — Ambulatory Visit
Admission: RE | Admit: 2013-10-23 | Discharge: 2013-10-23 | Disposition: A | Discharge status: Home / Self Care | Payer: Managed Care, Other (non HMO) | Source: Ambulatory Visit

## 2013-10-23 ENCOUNTER — Encounter (INDEPENDENT_AMBULATORY_CARE_PROVIDER_SITE_OTHER): Payer: Self-pay

## 2013-10-23 DIAGNOSIS — Z1231 Encounter for screening mammogram for malignant neoplasm of breast: Secondary | ICD-10-CM

## 2014-09-09 ENCOUNTER — Other Ambulatory Visit: Payer: Self-pay

## 2014-09-09 ENCOUNTER — Encounter (HOSPITAL_COMMUNITY): Payer: Self-pay

## 2014-09-09 ENCOUNTER — Encounter (HOSPITAL_COMMUNITY)
Admission: RE | Admit: 2014-09-09 | Discharge: 2014-09-09 | Disposition: A | Discharge status: Home / Self Care | Payer: Managed Care, Other (non HMO) | Source: Ambulatory Visit | Attending: Obstetrics & Gynecology | Admitting: Obstetrics & Gynecology

## 2014-09-09 DIAGNOSIS — N950 Postmenopausal bleeding: Secondary | ICD-10-CM | POA: Insufficient documentation

## 2014-09-09 DIAGNOSIS — Z01818 Encounter for other preprocedural examination: Secondary | ICD-10-CM | POA: Diagnosis present

## 2014-09-09 HISTORY — DX: Hypothyroidism, unspecified: E03.9

## 2014-09-09 HISTORY — DX: Depression, unspecified: F32.A

## 2014-09-09 HISTORY — DX: Major depressive disorder, single episode, unspecified: F32.9

## 2014-09-09 HISTORY — DX: Unspecified osteoarthritis, unspecified site: M19.90

## 2014-09-09 HISTORY — DX: Gastro-esophageal reflux disease without esophagitis: K21.9

## 2014-09-09 HISTORY — DX: Anxiety disorder, unspecified: F41.9

## 2014-09-09 LAB — CBC
HCT: 40.1 % (ref 36.0–46.0)
Hemoglobin: 14 g/dL (ref 12.0–15.0)
MCH: 30.9 pg (ref 26.0–34.0)
MCHC: 34.9 g/dL (ref 30.0–36.0)
MCV: 88.5 fL (ref 78.0–100.0)
Platelets: 221 10*3/uL (ref 150–400)
RBC: 4.53 MIL/uL (ref 3.87–5.11)
RDW: 13.9 % (ref 11.5–15.5)
WBC: 4.6 10*3/uL (ref 4.0–10.5)

## 2014-09-09 LAB — BASIC METABOLIC PANEL
ANION GAP: 6 (ref 5–15)
BUN: 17 mg/dL (ref 6–20)
CHLORIDE: 108 mmol/L (ref 101–111)
CO2: 27 mmol/L (ref 22–32)
Calcium: 9 mg/dL (ref 8.9–10.3)
Creatinine, Ser: 0.87 mg/dL (ref 0.44–1.00)
GFR calc Af Amer: >60 mL/min (ref >60)
GFR calc non Af Amer: >60 mL/min (ref >60)
GLUCOSE: 101 mg/dL — AB (ref 65–99)
POTASSIUM: 3.7 mmol/L (ref 3.5–5.1)
SODIUM: 141 mmol/L (ref 135–145)

## 2014-09-09 LAB — TYPE AND SCREEN
ABO/RH(D): A POS
ANTIBODY SCREEN: NEGATIVE

## 2014-09-09 NOTE — Pre-Procedure Instructions (Signed)
Patient had an abnormal EKG at PAT appt today. Dr. Glennon Mac reveiwed EKG and no new orders were received.

## 2014-09-09 NOTE — Patient Instructions (Addendum)
Your procedure is scheduled on: September 15, 2014  Enter through the Main Entrance of [facility] at:  1240 pm   Pick up the phone at the desk and dial [phone].  Call this number if you have problems the morning of surgery: [phone].  Remember: Do NOT eat food: after midnight on Tuesday  Do NOT drink clear liquids after:  1010 am  Take these medicines the morning of surgery with a SIP OF WATER:  Prozac, synthroid   Do NOT wear jewelry (body piercing), metal hair clips/bobby pins, make-up, or nail polish. Do NOT wear lotions, powders, or perfumes.  You may wear deoderant. Do NOT shave for 48 hours prior to surgery. Do NOT bring valuables to the hospital. Contacts, dentures, or bridgework may not be worn into surgery. Have a responsible adult drive you home and stay with you for 24 hours after your procedure

## 2014-09-12 NOTE — H&P (Signed)
Ashley Villegas is an 64 y.o. female.postmenopausal female who presents for hysteroscopy, D&C due to postmenopausal bleeding. In review, she reports that a fews months ago, she noted bright red vaginal bleeding that she thought may have been due to rectal bleeding. However, this past month the bleeding returned. It is bright red bleeding requiring about 1 pad per day- happened for two days per week for the past 2 weeks. She reports no acute changes since that time.  An TVUS was performed that revealed the following: Reviewed US findings: Anteverted uterus measuring 8.5x6.4x5cm, endometrium thickened- 2.02cm, no blood flow noted. Uterus has several (5) small fibroids all ~ 2cm in size. Bilateral ovaries within normal limits.  An in-office EMB was attempted, but due to cervical stenosis, it was not able to be completed.  Gyn History:  Sexual activity currently sexually active.  Periods : postmenopausal.  LMP NA.  Birth control BTL.  Last pap smear date 01/12/2013 Negative.  Last mammogram date 09/2013.  Denies H/O Abnormal pap smear.  Denies H/O STD.       OB History:  Pregnancy # 1 live birth, vaginal delivery.  Pregnancy # 2 miscarriage.  Pregnancy # 3 live birth, C-section.        Family History: Father: deceased 39 yrs, DementiaMother: deceased 63 yrs, Heart failure, diagnosed with CHFBrother 1: alive, celiac diseaseBrother2: alive, diabetes, diagnosed with DMBrother 3: aliveSister 1: alive, depressionSister 2: deceased, depression, suicide, diagnosed with CVASister 3: alive3 brother(s) , 3 sister(s) . 1 son(s) , 1 daughter(s) .  son in prison, denies any GYN family cancer hx.       Social History:  General:  Tobacco use  cigarettes: Former smoker Quit in year 2009 Pack-year Hx: 7 Tobacco history last updated 08/13/2014 Additional Findings: Tobacco Non-User Ex-moderate cigarette smoker (10-19/day) no Smoking.  Alcohol: yes, occasionally.  no Recreational drug use.   Exercise: walk 1 hour a day.  Occupation: Tax inspector.  Marital Status: Single.  Children: 2.      Past Medical History  Diagnosis Date  . Hypothyroidism     on synthroid   . Depression   . Anxiety   . GERD (gastroesophageal reflux disease)   . Arthritis     Past Surgical History  Procedure Laterality Date  . Diagnostic laparoscopy      fibroid tumors on her ovaries   . Tubal ligation    . Cesarean section    . Dilation and curettage of uterus    . Wisdom tooth extraction    . Knee surgery     Allergies:  Allergies  Allergen Reactions  . Hydrocortisone Rash    Medications:  Lasix(Furosemide) 40 mg Tablet 1 tablet as needed Once a day,  Cetirizine HCl 10 MG Tablet 1 tablet Once a day,  Fluoxetine HCl 40.0 Milligram Capsule 1 capsule Once a day,  Synthroid(L-Thyroxine Sodium) 88 MCG Tablet 1 tablet Once a day, Synthroid 100 MCG(L-Thyroxine Sodium) 100 MCG Tablet 1 tablet Once a day 5x a week   ROS CONSTITUTIONAL:  no Chills. no Fever. no Night sweats.  SKIN:  no Rash. no Hives.  HEENT:  Blurrred vision no. no Double vision.  CARDIOLOGY:  no Chest pain.  RESPIRATORY:  no Shortness of breath. no Cough.  GASTROENTEROLOGY:  no Abdominal pain. no Appetite change. no Change in bowel movements.  UROLOGY:  no Urinary frequency. no Urinary incontinence. no Urinary urgency.  FEMALE REPRODUCTIVE:  no Breast lumps or discharge. no Breast pain.  NEUROLOGY:  no Confusion. no Dizziness. no Fainting. no Headache. no Loss of consciousness.  PSYCHOLOGY:  no Depression. no Confusion.  HEMATOLOGY/LYMPH:  no Anemia. no Fatigue. Using Blood Thinners no.    Physical Exam (Examination performed in office) GENERAL APPEARANCE alert, oriented, NAD, pleasant.  SKIN: normal, no rash.  NECK: supple, normal appearance.  LUNGS: clear to auscultation bilaterally, no wheezes, rhonchi, rales.  HEART: no murmurs, regular rate and rhythm.  ABDOMEN: no masses  palpated, soft and not tender, no rebound, no guarding.  FEMALE GENITOURINARY: No external lesions, Vagina - pink pale mucosa with slight loss of rugae, no lesions or abnormal discharge, cervix - closed, no discharge or lesions, no evidence of visible bleeding, cervical stenosis seen, on bimanual exam: slight right-sided tenderness on examination. ~0.5cm palpable raised nodule noted on bimanual- with repeat visual inspection- no abnormality apprecaited. No CMT. No adnexal masses bilaterally. Uterus: nontender and normal size on palpation, normal urethra.  EXTREMITIES: no edema present today, no calf tenderness bilaterally, normal range of motion.   Labs:  CBC Latest Ref Rng 09/09/2014  WBC 4.0 - 10.5 K/uL 4.6  Hemoglobin 12.0 - 15.0 g/dL 14.0  Hematocrit 36.0 - 46.0 % 40.1  Platelets 150 - 400 K/uL 221    Assessment/Plan: 64yo postmenopausal female who presents for hysteroscopy, D&C due to postmenopausal bleeding, thickened endometrium and cervical stenosis. -NPO -LR @ 125cc/hr -SCDs to OR -Reviewed risk, benefit and indications- including but not limited to risk of bleeding, infection and injury to organs (ie uterine perforation). All questions and concerns were addressed and she wishes to proceed.    Janyth Pupa, M 09/12/2014, 7:55 AM

## 2014-09-15 ENCOUNTER — Encounter (HOSPITAL_COMMUNITY): Payer: Self-pay | Admitting: Certified Registered Nurse Anesthetist

## 2014-09-15 ENCOUNTER — Ambulatory Visit (HOSPITAL_COMMUNITY)
Admission: RE | Admit: 2014-09-15 | Discharge: 2014-09-15 | Disposition: A | Discharge status: Home / Self Care | Payer: Managed Care, Other (non HMO) | Source: Ambulatory Visit | Attending: Obstetrics & Gynecology | Admitting: Obstetrics & Gynecology

## 2014-09-15 ENCOUNTER — Ambulatory Visit (HOSPITAL_COMMUNITY): Payer: Managed Care, Other (non HMO) | Admitting: Anesthesiology

## 2014-09-15 ENCOUNTER — Encounter (HOSPITAL_COMMUNITY)
Admission: RE | Disposition: A | Discharge status: Home / Self Care | Payer: Self-pay | Source: Ambulatory Visit | Attending: Obstetrics & Gynecology

## 2014-09-15 DIAGNOSIS — E039 Hypothyroidism, unspecified: Secondary | ICD-10-CM | POA: Insufficient documentation

## 2014-09-15 DIAGNOSIS — K219 Gastro-esophageal reflux disease without esophagitis: Secondary | ICD-10-CM | POA: Insufficient documentation

## 2014-09-15 DIAGNOSIS — Z9851 Tubal ligation status: Secondary | ICD-10-CM | POA: Insufficient documentation

## 2014-09-15 DIAGNOSIS — Z79899 Other long term (current) drug therapy: Secondary | ICD-10-CM | POA: Insufficient documentation

## 2014-09-15 DIAGNOSIS — Z87891 Personal history of nicotine dependence: Secondary | ICD-10-CM | POA: Diagnosis not present

## 2014-09-15 DIAGNOSIS — N882 Stricture and stenosis of cervix uteri: Secondary | ICD-10-CM | POA: Insufficient documentation

## 2014-09-15 DIAGNOSIS — F419 Anxiety disorder, unspecified: Secondary | ICD-10-CM | POA: Diagnosis not present

## 2014-09-15 DIAGNOSIS — M1990 Unspecified osteoarthritis, unspecified site: Secondary | ICD-10-CM | POA: Insufficient documentation

## 2014-09-15 DIAGNOSIS — F329 Major depressive disorder, single episode, unspecified: Secondary | ICD-10-CM | POA: Insufficient documentation

## 2014-09-15 DIAGNOSIS — N950 Postmenopausal bleeding: Secondary | ICD-10-CM | POA: Diagnosis present

## 2014-09-15 DIAGNOSIS — C541 Malignant neoplasm of endometrium: Secondary | ICD-10-CM | POA: Diagnosis not present

## 2014-09-15 HISTORY — PX: HYSTEROSCOPY WITH D & C: SHX1775

## 2014-09-15 LAB — TYPE AND SCREEN
ABO/RH(D): A POS
Antibody Screen: NEGATIVE

## 2014-09-15 SURGERY — DILATATION AND CURETTAGE /HYSTEROSCOPY
Anesthesia: General | Site: Vagina

## 2014-09-15 MED ORDER — FENTANYL CITRATE (PF) 100 MCG/2ML IJ SOLN
INTRAMUSCULAR | Status: AC
  Filled 2014-09-15: qty 2

## 2014-09-15 MED ORDER — ONDANSETRON HCL 4 MG/2ML IJ SOLN
INTRAMUSCULAR | Status: DC | PRN
  Administered 2014-09-15: 4 mg via INTRAVENOUS

## 2014-09-15 MED ORDER — EPHEDRINE SULFATE 50 MG/ML IJ SOLN
INTRAMUSCULAR | Status: DC | PRN
  Administered 2014-09-15: 10 mg via INTRAVENOUS

## 2014-09-15 MED ORDER — MIDAZOLAM HCL 2 MG/2ML IJ SOLN
INTRAMUSCULAR | Status: DC | PRN
  Administered 2014-09-15: 2 mg via INTRAVENOUS

## 2014-09-15 MED ORDER — SCOPOLAMINE 1 MG/3DAYS TD PT72
MEDICATED_PATCH | TRANSDERMAL | Status: AC
  Administered 2014-09-15: 1.5 mg via TRANSDERMAL
  Filled 2014-09-15: qty 1

## 2014-09-15 MED ORDER — SCOPOLAMINE 1 MG/3DAYS TD PT72
1.0000 | MEDICATED_PATCH | TRANSDERMAL | Status: DC
  Administered 2014-09-15: 1.5 mg via TRANSDERMAL

## 2014-09-15 MED ORDER — ONDANSETRON HCL 4 MG/2ML IJ SOLN: 4.0000 mg | Freq: Once | INTRAMUSCULAR | Status: DC | PRN

## 2014-09-15 MED ORDER — ATROPINE SULFATE 0.4 MG/ML IJ SOLN
INTRAMUSCULAR | Status: AC
  Filled 2014-09-15: qty 1

## 2014-09-15 MED ORDER — LACTATED RINGERS IV SOLN: INTRAVENOUS | Status: DC

## 2014-09-15 MED ORDER — FENTANYL CITRATE (PF) 100 MCG/2ML IJ SOLN: 25.0000 ug | INTRAMUSCULAR | Status: DC | PRN

## 2014-09-15 MED ORDER — DEXAMETHASONE SODIUM PHOSPHATE 10 MG/ML IJ SOLN
INTRAMUSCULAR | Status: DC | PRN
  Administered 2014-09-15: 4 mg via INTRAVENOUS

## 2014-09-15 MED ORDER — EPHEDRINE 5 MG/ML INJ
INTRAVENOUS | Status: AC
  Filled 2014-09-15: qty 10

## 2014-09-15 MED ORDER — ATROPINE SULFATE 0.4 MG/ML IJ SOLN
INTRAMUSCULAR | Status: DC | PRN
  Administered 2014-09-15: 0.4 mg via INTRAVENOUS

## 2014-09-15 MED ORDER — DEXAMETHASONE SODIUM PHOSPHATE 4 MG/ML IJ SOLN
INTRAMUSCULAR | Status: AC
  Filled 2014-09-15: qty 1

## 2014-09-15 MED ORDER — KETOROLAC TROMETHAMINE 30 MG/ML IJ SOLN
INTRAMUSCULAR | Status: AC
Start: 2014-09-15 — End: 2014-09-15
  Filled 2014-09-15: qty 1

## 2014-09-15 MED ORDER — MIDAZOLAM HCL 2 MG/2ML IJ SOLN
INTRAMUSCULAR | Status: AC
  Filled 2014-09-15: qty 2

## 2014-09-15 MED ORDER — FENTANYL CITRATE (PF) 100 MCG/2ML IJ SOLN
INTRAMUSCULAR | Status: DC | PRN
  Administered 2014-09-15 (×2): 50 ug via INTRAVENOUS

## 2014-09-15 MED ORDER — ONDANSETRON HCL 4 MG/2ML IJ SOLN
INTRAMUSCULAR | Status: AC
  Filled 2014-09-15: qty 2

## 2014-09-15 MED ORDER — PROPOFOL 10 MG/ML IV BOLUS
INTRAVENOUS | Status: AC
  Filled 2014-09-15: qty 20

## 2014-09-15 MED ORDER — SODIUM CHLORIDE 0.9 % IR SOLN
Status: DC | PRN
  Administered 2014-09-15: 3000 mL

## 2014-09-15 MED ORDER — PROPOFOL 10 MG/ML IV BOLUS
INTRAVENOUS | Status: DC | PRN
  Administered 2014-09-15: 50 mg via INTRAVENOUS
  Administered 2014-09-15: 150 mg via INTRAVENOUS

## 2014-09-15 MED ORDER — LACTATED RINGERS IV SOLN
INTRAVENOUS | Status: DC
  Administered 2014-09-15 (×2): via INTRAVENOUS

## 2014-09-15 MED ORDER — LIDOCAINE HCL (CARDIAC) 20 MG/ML IV SOLN
INTRAVENOUS | Status: DC | PRN
  Administered 2014-09-15: 30 mg via INTRAVENOUS

## 2014-09-15 MED ORDER — KETOROLAC TROMETHAMINE 30 MG/ML IJ SOLN
INTRAMUSCULAR | Status: DC | PRN
  Administered 2014-09-15: 30 mg via INTRAVENOUS

## 2014-09-15 SURGICAL SUPPLY — 17 items
CANISTER SUCT 3000ML (MISCELLANEOUS) ×3 IMPLANT
CATH ROBINSON RED A/P 16FR (CATHETERS) ×3 IMPLANT
CLOTH BEACON ORANGE TIMEOUT ST (SAFETY) ×3 IMPLANT
CONTAINER PREFILL 10% NBF 60ML (FORM) ×6 IMPLANT
DEVICE MYOSURE CLASSIC (MISCELLANEOUS) ×2 IMPLANT
DILATOR CANAL MILEX (MISCELLANEOUS) ×2 IMPLANT
GLOVE BIOGEL PI IND STRL 6.5 (GLOVE) ×2 IMPLANT
GLOVE BIOGEL PI INDICATOR 6.5 (GLOVE) ×4
GLOVE ECLIPSE 6.5 STRL STRAW (GLOVE) ×3 IMPLANT
GOWN STRL REUS W/TWL LRG LVL3 (GOWN DISPOSABLE) ×6 IMPLANT
PACK VAGINAL MINOR WOMEN LF (CUSTOM PROCEDURE TRAY) ×3 IMPLANT
PAD OB MATERNITY 4.3X12.25 (PERSONAL CARE ITEMS) ×3 IMPLANT
SEAL ROD LENS SCOPE MYOSURE (ABLATOR) ×2 IMPLANT
TOWEL OR 17X24 6PK STRL BLUE (TOWEL DISPOSABLE) ×6 IMPLANT
TUBING AQUILEX INFLOW (TUBING) ×3 IMPLANT
TUBING AQUILEX OUTFLOW (TUBING) ×3 IMPLANT
WATER STERILE IRR 1000ML POUR (IV SOLUTION) ×3 IMPLANT

## 2014-09-15 NOTE — Transfer of Care (Signed)
Immediate Anesthesia Transfer of Care Note  Patient: Ashley Villegas  Procedure(s) Performed: Procedure(s): DILATATION AND CURETTAGE /HYSTEROSCOPY/MYOSURE RESECTION OF POLYP (N/A)  Patient Location: PACU  Anesthesia Type:General  Level of Consciousness: awake, alert  and oriented  Airway & Oxygen Therapy: Patient Spontanous Breathing and Patient connected to nasal cannula oxygen  Post-op Assessment: Report given to RN and Post -op Vital signs reviewed and stable  Post vital signs: Reviewed and stable  Last Vitals:  Filed Vitals:   09/15/14 1238  BP: 114/73  Pulse: 67  Temp: 36.9 C  Resp: 18    Complications: No apparent anesthesia complications

## 2014-09-15 NOTE — Anesthesia Preprocedure Evaluation (Addendum)
Anesthesia Evaluation  Patient identified by MRN, date of birth, ID band Patient awake    Reviewed: Allergy & Precautions, NPO status , Patient's Chart, lab work & pertinent test results  History of Anesthesia Complications Negative for: history of anesthetic complications  Airway Mallampati: II  TM Distance: >3 FB Neck ROM: Full    Dental  (+) Upper Dentures   Pulmonary former smoker,  breath sounds clear to auscultation  Pulmonary exam normal       Cardiovascular negative cardio ROS Normal cardiovascular examRhythm:Regular Rate:Normal     Neuro/Psych PSYCHIATRIC DISORDERS Anxiety Depression negative neurological ROS     GI/Hepatic Neg liver ROS, GERD-  Medicated and Controlled,  Endo/Other  Hypothyroidism   Renal/GU negative Renal ROS  negative genitourinary   Musculoskeletal  (+) Arthritis -, Osteoarthritis,    Abdominal   Peds negative pediatric ROS (+)  Hematology negative hematology ROS (+)   Anesthesia Other Findings   Reproductive/Obstetrics negative OB ROS PMB Thickened endometrium Cervical stenosis                           Anesthesia Physical Anesthesia Plan  ASA: II  Anesthesia Plan: General   Post-op Pain Management:    Induction: Intravenous  Airway Management Planned: LMA  Additional Equipment:   Intra-op Plan:   Post-operative Plan: Extubation in OR  Informed Consent: I have reviewed the patients History and Physical, chart, labs and discussed the procedure including the risks, benefits and alternatives for the proposed anesthesia with the patient or authorized representative who has indicated his/her understanding and acceptance.   Dental advisory given  Plan Discussed with: CRNA, Anesthesiologist and Surgeon  Anesthesia Plan Comments:       Anesthesia Quick Evaluation

## 2014-09-15 NOTE — Anesthesia Postprocedure Evaluation (Signed)
  Anesthesia Post-op Note  Patient: Ashley Villegas  Procedure(s) Performed: Procedure(s): DILATATION AND CURETTAGE /HYSTEROSCOPY/MYOSURE RESECTION OF POLYP (N/A)  Patient Location: PACU  Anesthesia Type:General  Level of Consciousness: awake, alert  and oriented  Airway and Oxygen Therapy: Patient Spontanous Breathing  Post-op Pain: none  Post-op Assessment: Post-op Vital signs reviewed, Patient's Cardiovascular Status Stable, Respiratory Function Stable, Patent Airway, No signs of Nausea or vomiting and Pain level controlled              Post-op Vital Signs: Reviewed and stable  Last Vitals:  Filed Vitals:   09/15/14 1600  BP: 140/82  Pulse: 77  Temp: 36.4 C  Resp: 14    Complications: No apparent anesthesia complications

## 2014-09-15 NOTE — Discharge Instructions (Signed)
HOME INSTRUCTIONS  Please note any unusual or excessive bleeding, pain, swelling. Mild dizziness or drowsiness are normal for about 24 hours after surgery.   Shower when comfortable  Restrictions: No driving for 24 hours or while taking pain medications.  Activity:  No heavy lifting (> 25 lbs), nothing in vagina (no tampons, douching, or intercourse) x 2 weeks; no tub baths for 2 weeks Vaginal spotting is expected but if your bleeding is heavy, period like,  please call the office   Diet:  You may eat return to your regular diet.  Do not eat large meals.  Eat small frequent meals throughout the day.  Continue to drink a good amount of water at least 6-8 glasses of water per day, hydration is very important for the healing process.  Pain Management: Take Motrin and/or Tylenol as prescribed/needed for pain.  Always take prescription pain medication with food, it may cause constipation, increase fluids and fiber and you may want to take an over-the-counter stool softener like Colace as needed up to 2x a day.    Alcohol -- Avoid for 24 hours and while taking pain medications.  Nausea: Take sips of ginger ale or soda  Fever -- Call physician if temperature over 101 degrees  Follow up:  If you do not already have a follow up appointment scheduled, please call the office at [phone].  If you experience fever (a temperature greater than 100.4), pain unrelieved by pain medication, shortness of breath, swelling of a single leg, or any other symptoms which are concerning to you please the office immediately.

## 2014-09-15 NOTE — Anesthesia Procedure Notes (Signed)
Procedure Name: LMA Insertion Date/Time: 09/15/2014 1:43 PM Performed by: Bufford Spikes Pre-anesthesia Checklist: Patient identified, Timeout performed, Emergency Drugs available, Suction available and Patient being monitored Patient Re-evaluated:Patient Re-evaluated prior to inductionOxygen Delivery Method: Circle system utilized, Simple face mask, Nasal cannula, Ambu bag and Non-rebreather mask Preoxygenation: Pre-oxygenation with 100% oxygen Intubation Type: IV induction Ventilation: Mask ventilation without difficulty LMA: LMA inserted LMA Size: 4.0 Number of attempts: 1 Placement Confirmation: positive ETCO2 and breath sounds checked- equal and bilateral Tube secured with: Tape Dental Injury: Teeth and Oropharynx as per pre-operative assessment

## 2014-09-15 NOTE — Op Note (Signed)
Operative Report  PreOp: postmenopausal bleeding, cervical stenosis PostOp: same and uterine polyp Procedure:  Hysteroscopy, Dilation and Curettage, Myosure polypectomy Surgeon: Dr. Janyth Pupa Anesthesia: General Complications:none EBL: 13mL UOP: 187mL IVF:1090mL  Findings:8cm uterus with thickened polypoid-like endometrium, large 2cm irregular appearing polyp  Specimens: 1) endometrial curettings  Procedure: The patient was taken to the operating room where she underwent general anesthesia without difficulty. The patient was placed in a low lithotomy position using Allen stirrups. She was then prepped and draped in the normal sterile fashion. The bladder was drained using a red rubber urethral catheter. A sterile speculum was inserted into the vagina. A single tooth tenaculum was placed on the anterior lip of the cervix. Due to cervical stenosis, serial dilation was performed to allow passage of the 83mm hysteroscope. The diagnostic hysteroscope was then inserted without difficulty and noted to have the findings as listed above. The hysteroscope was removed and replaced with the Myosure.  Polypectomy was performed with removal of the polyp as well as sampling of the endometrial tissue.  The hystersocope was removed and sharp curettage was performed. The tissue was sent to pathology. Upon completion, the hysteroscope was reinserted, no uterine perforation was visualized. All instrument were then removed. Hemostasis was observed at the cervical site. The patient was repositioned to the supine position. The patient tolerated the procedure without any complications and taken to recovery in stable condition.   Janyth Pupa, DO [phone] (pager) [phone] (office)

## 2014-09-15 NOTE — Interval H&P Note (Signed)
History and Physical Interval Note:  09/15/2014 1:12 PM  Ashley Villegas  has presented today for surgery, with the diagnosis of N95.0 Postmenopausal Bleeding  The various methods of treatment have been discussed with the patient and family. After consideration of risks, benefits and other options for treatment, the patient has consented to  Procedure(s): DILATATION AND CURETTAGE /HYSTEROSCOPY (N/A) as a surgical intervention .  The patient's history has been reviewed, patient examined, no change in status, stable for surgery.  I have reviewed the patient's chart and labs.  Questions were answered to the patient's satisfaction.     Janyth Pupa, M

## 2014-09-18 ENCOUNTER — Encounter (HOSPITAL_COMMUNITY): Payer: Self-pay | Admitting: Obstetrics & Gynecology

## 2014-09-30 ENCOUNTER — Encounter: Payer: Self-pay | Admitting: Gynecologic Oncology

## 2014-09-30 ENCOUNTER — Ambulatory Visit: Payer: Managed Care, Other (non HMO) | Attending: Gynecologic Oncology | Admitting: Gynecologic Oncology

## 2014-09-30 VITALS — BP 135/63 | HR 66 | Temp 98.0°F | Resp 20 | Ht 60.5 in | Wt 168.6 lb

## 2014-09-30 DIAGNOSIS — C541 Malignant neoplasm of endometrium: Secondary | ICD-10-CM | POA: Diagnosis present

## 2014-09-30 NOTE — Progress Notes (Signed)
Consult Note: Gyn-Onc  Consult was requested by Dr. Nelda Marseille for the evaluation of jeraline marcinek 64 y.o. female  CC:  Chief Complaint  Patient presents with  . endometrial adenocarcinoma    Assessment/Plan:  Ms. rye dorado  is a 64 y.o.  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   A detailed discussion was held with the patient and her family with regard to to her endometrial cancer diagnosis. We discussed the standard management options for uterine cancer which includes surgery followed possibly by adjuvant therapy depending on the results of surgery. The options for surgical management include a hysterectomy and removal of the tubes and ovaries possibly with removal of pelvic and para-aortic lymph nodes. A minimally invasive approach including a robotic hysterectomy or laparoscopic hysterectomy have benefits including shorter hospital stay, recovery time and better wound healing. The alternative approach is an open hysterectomy. The patient has been counseled about these surgical options and the risks of surgery in general including infection, bleeding, damage to surrounding structures (including bowel, bladder, ureters, nerves or vessels), and the postoperative risks of PE/ DVT, and lymphedema. I extensively reviewed the additional risks of robotic hysterectomy including possible need for conversion to open laparotomy.  I discussed positioning during surgery of trendelenberg and risks of minor facial swelling and care we take in preoperative positioning.  After counseling and consideration of her options, she desires to proceed with robotic hysterectomy, BSO, possible lymphadenectomy.   She will be seen by anesthesia for preoperative clearance and discussion of postoperative pain management.  She was given the opportunity to ask questions, which were answered to her satisfaction, and she is agreement with the above mentioned plan of care.  HPI: Tiera Mensinger is a 64 year old gravida 2 para 2 who is seen in consultation at the request of Dr. Nelda Marseille for grade 1 endometrioid endometrial adenocarcinoma. The patient reports having postmenopausal bleeding for a proximally 7 months. It is irregular, and mostly spotting. She was evaluated by Dr. Ted Mcalpine for this who performed a D&C procedure on 09/15/2014 after a prior ultrasound scan on 08/17/2014 revealed a normal size uterus measuring 5 x 8.5 x 6.4 cm but with a 2 cm thickened endometrial stripe. This prompted the sampling which revealed a well-differentiated adenocarcinoma FIGO grade 1  She is otherwise healthy woman who only takes Synthroid for hypothyroidism. She has had recent rectal bleeding and is scheduling a colonoscopy for a diagnostic workup of this. She has no family history for colon, Uterine, ovary, or breast cancer. She is no other major risk factors for endometrial cancer at the sides a BMI of 32 kg/m.  Her only prior surgeries on the abdomen is a cesarean section, tubal ligation, and several laparoscopic surgery for ovarian cysts.  Current Meds:  Outpatient Encounter Prescriptions as of 09/30/2014  Medication Sig  . cetirizine (ZYRTEC) 10 MG tablet Take 10 mg by mouth daily.  Marland Kitchen FLUoxetine (PROZAC) 40 MG capsule Take 40 mg by mouth daily.  Marland Kitchen levothyroxine (SYNTHROID, LEVOTHROID) 100 MCG tablet Take 100 mcg by mouth daily before breakfast. Patient takes mon - fri  . levothyroxine (SYNTHROID, LEVOTHROID) 88 MCG tablet Take 88 mcg by mouth daily before breakfast. Patient takes sat and sun  . furosemide (LASIX) 40 MG tablet Take 40 mg by mouth daily.   No facility-administered encounter medications on file as of 09/30/2014.    Allergy:  Allergies  Allergen Reactions  . Hydrocortisone Rash    Social Hx:  History   Social History  . Marital Status: Single    Spouse Name: N/A  . Number of Children: N/A  . Years of Education: N/A   Occupational History  . Not on file.   Social  History Main Topics  . Smoking status: Former Smoker -- 0.50 packs/day for 13 years    Types: Cigarettes  . Smokeless tobacco: Never Used  . Alcohol Use: No  . Drug Use: No  . Sexual Activity: Not on file   Other Topics Concern  . Not on file   Social History Narrative    Past Surgical Hx:  Past Surgical History  Procedure Laterality Date  . Diagnostic laparoscopy      fibroid tumors on her ovaries   . Tubal ligation    . Cesarean section    . Dilation and curettage of uterus    . Wisdom tooth extraction    . Knee surgery    . Hysteroscopy w/d&c N/A 09/15/2014    Procedure: DILATATION AND CURETTAGE /HYSTEROSCOPY/MYOSURE RESECTION OF POLYP;  Surgeon: Janyth Pupa, DO;  Location: [facility];  Service: Gynecology;  Laterality: N/A;    Past Medical Hx:  Past Medical History  Diagnosis Date  . Hypothyroidism     on synthroid   . Depression   . Anxiety   . GERD (gastroesophageal reflux disease)   . Arthritis     Past Gynecological History:  C/s and SVD.  No LMP recorded.  Family Hx: History reviewed. No pertinent family history.  Review of Systems:  Constitutional  Feels well,   ENT Normal appearing ears and nares bilaterally Skin/Breast  No rash, sores, jaundice, itching, dryness Cardiovascular  No chest pain, shortness of breath, or edema  Pulmonary  No cough or wheeze.  Gastro Intestinal  No nausea, vomitting, or diarrhoea. No bright red blood per rectum, no abdominal pain, change in bowel movement, or constipation.  Genito Urinary  No frequency, urgency, dysuria, see HPI Musculo Skeletal  No myalgia, arthralgia, joint swelling or pain  Neurologic  No weakness, numbness, change in gait,  Psychology  No depression, anxiety, insomnia.   Vitals:  Blood pressure 135/63, pulse 66, temperature 98 F (36.7 C), temperature source Oral, resp. rate 20, height 5' 0.5" (1.537 m), weight 168 lb 9.6 oz (76.476 kg), SpO2 97 %.  Physical Exam: WD in NAD Neck  Supple  NROM, without any enlargements.  Lymph Node Survey No cervical supraclavicular or inguinal adenopathy Cardiovascular  Pulse normal rate, regularity and rhythm. S1 and S2 normal.  Lungs  Clear to auscultation bilateraly, without wheezes/crackles/rhonchi. Good air movement.  Skin  No rash/lesions/breakdown  Psychiatry  Alert and oriented to person, place, and time  Abdomen  Normoactive bowel sounds, abdomen soft, non-tender and obese without evidence of hernia.  Back No CVA tenderness Genito Urinary  Vulva/vagina: Normal external female genitalia.   No lesions. No discharge or bleeding.  Bladder/urethra:  No lesions or masses, well supported bladder  Vagina:   Cervix: Normal appearing, no lesions.  Uterus: slightly enlarged, 10 week size mobile, no parametrial involvement or nodularity.  Adnexa: no palpable masses. Rectal  Good tone, no masses no cul de sac nodularity.  Extremities  No bilateral cyanosis, clubbing or edema.  Donaciano Eva, MD   09/30/2014, 5:38 PM

## 2014-09-30 NOTE — Patient Instructions (Signed)
Preparing for your Surgery  Plan for surgery on August 2 with Dr. Alycia Rossetti.  Pre-operative Testing -You will receive a phone call from presurgical testing at [facility] to arrange for a pre-operative testing appointment before your surgery.  This appointment normally occurs one to two weeks before your scheduled surgery.   -Bring your insurance card, copy of an advanced directive if applicable, medication list  -At that visit, you will be asked to sign a consent for a possible blood transfusion in case a transfusion becomes necessary during surgery.  The need for a blood transfusion is rare but having consent is a necessary part of your care.     -You should not be taking blood thinners or aspirin at least ten days prior to surgery unless instructed by your surgeon.  Day Before Surgery at [place] will be asked to take in only clear liquids the day before surgery.  Examples of clear liquids include broths, jello, and clear juices. You will be advised to have nothing to eat or drink after midnight the evening before.    Your role in recovery Your role is to become active as soon as directed by your doctor, while still giving yourself time to heal.  Rest when you feel tired. You will be asked to do the following in order to speed your recovery:  - Cough and breathe deeply. This helps toclear and expand your lungs and can prevent pneumonia. You may be given a spirometer to practice deep breathing. A staff member will show you how to use the spirometer. - Do mild physical activity. Walking or moving your legs help your circulation and body functions return to normal. A staff member will help you when you try to walk and will provide you with simple exercises. Do not try to get up or walk alone the first time. - Actively manage your pain. Managing your pain lets you move in comfort. We will ask you to rate your pain on a scale of zero to 10. It is your responsibility to tell  your doctor or nurse where and how much you hurt so your pain can be treated.  Special Considerations -If you are diabetic, you may be placed on insulin after surgery to have closer control over your blood sugars to promote healing and recovery.  This does not mean that you will be discharged on insulin.  If applicable, your oral antidiabetics will be resumed when you are tolerating a solid diet.  -Your final pathology results from surgery should be available by the Friday after surgery and the results will be relayed to you when available.  Blood Transfusion Information WHAT IS A BLOOD TRANSFUSION? A transfusion is the replacement of blood or some of its parts. Blood is made up of multiple cells which provide different functions.  Red blood cells carry oxygen and are used for blood loss replacement.  White blood cells fight against infection.  Platelets control bleeding.  Plasma helps clot blood.  Other blood products are available for specialized needs, such as hemophilia or other clotting disorders. BEFORE THE TRANSFUSION  Who gives blood for transfusions?   You may be able to donate blood to be used at a later date on yourself (autologous donation).  Relatives can be asked to donate blood. This is generally not any safer than if you have received blood from a stranger. The same precautions are taken to ensure safety when a relative's blood is donated.  Healthy volunteers who are fully evaluated  to make sure their blood is safe. This is blood bank blood. Transfusion therapy is the safest it has ever been in the practice of medicine. Before blood is taken from a donor, a complete history is taken to make sure that person has no history of diseases nor engages in risky social behavior (examples are intravenous drug use or sexual activity with multiple partners). The donor's travel history is screened to minimize risk of transmitting infections, such as malaria. The donated blood is  tested for signs of infectious diseases, such as HIV and hepatitis. The blood is then tested to be sure it is compatible with you in order to minimize the chance of a transfusion reaction. If you or a relative donates blood, this is often done in anticipation of surgery and is not appropriate for emergency situations. It takes many days to process the donated blood. RISKS AND COMPLICATIONS Although transfusion therapy is very safe and saves many lives, the main dangers of transfusion include:   Getting an infectious disease.  Developing a transfusion reaction. This is an allergic reaction to something in the blood you were given. Every precaution is taken to prevent this. The decision to have a blood transfusion has been considered carefully by your caregiver before blood is given. Blood is not given unless the benefits outweigh the risks.

## 2014-10-19 NOTE — Patient Instructions (Addendum)
YOUR PROCEDURE IS SCHEDULED ON :  10/26/14  REPORT TO [facility] MAIN ENTRANCE FOLLOW SIGNS TO EAST ELEVATOR - GO TO 3rd FLOOR CHECK IN AT 3 EAST NURSES STATION (SHORT STAY) AT:  11:30 AM  CALL THIS NUMBER IF YOU HAVE PROBLEMS THE MORNING OF SURGERY [phone]  REMEMBER:ONLY 1 PER PERSON MAY GO TO SHORT STAY WITH YOU TO GET READY THE MORNING OF YOUR SURGERY  DO NOT EAT FOOD  AFTER MIDNIGHT  MAY CONTINUE CLEAR LIQUIDS UNTIL 7:40 AM  TAKE THESE MEDICINES THE MORNING OF SURGERY:  SYNTHROID / [name]   Foods Allowed                                                                     Foods Excluded  Coffee and tea, regular and decaf                             liquids that you cannot  Plain Jell-O in any flavor                                             see through such as: Fruit ices (not with fruit pulp)                                     milk, soups, orange juice  Iced Popsicles                                                 All solid food Carbonated beverages, regular and diet                                    Cranberry, grape and apple juices Sports drinks like Gatorade Lightly seasoned clear broth or consume(fat free) Sugar, honey syrup _____________________________________________________________________  YOU MAY NOT HAVE ANY METAL ON YOUR BODY INCLUDING HAIR PINS AND PIERCING'S. DO NOT WEAR JEWELRY, MAKEUP, LOTIONS, POWDERS OR PERFUMES. DO NOT WEAR NAIL POLISH. DO NOT SHAVE 48 HRS PRIOR TO SURGERY. MEN MAY SHAVE FACE AND NECK.  DO NOT [place]. [facility] IS NOT RESPONSIBLE FOR VALUABLES.  CONTACTS, DENTURES OR PARTIALS MAY NOT BE WORN TO SURGERY. LEAVE SUITCASE IN CAR. CAN BE BROUGHT TO ROOM AFTER SURGERY.  PATIENTS DISCHARGED THE DAY OF SURGERY WILL NOT BE ALLOWED TO DRIVE HOME.  PLEASE READ OVER THE FOLLOWING INSTRUCTION  SHEETS _________________________________________________________________________________                                          [place] - PREPARING FOR SURGERY  Before surgery, you can  play an important role.  Because skin is not sterile, your skin needs to be as free of germs as possible.  You can reduce the number of germs on your skin by washing with CHG (chlorahexidine gluconate) soap before surgery.  CHG is an antiseptic cleaner which kills germs and bonds with the skin to continue killing germs even after washing. Please DO NOT use if you have an allergy to CHG or antibacterial soaps.  If your skin becomes reddened/irritated stop using the CHG and inform your nurse when you arrive at Short Stay. Do not shave (including legs and underarms) for at least 48 hours prior to the first CHG shower.  You may shave your face. Please follow these instructions carefully:   1.  Shower with CHG Soap the night before surgery and the  morning of Surgery.   2.  If you choose to wash your hair, wash your hair first as usual with your  normal  Shampoo.   3.  After you shampoo, rinse your hair and body thoroughly to remove the  shampoo.                                         4.  Use CHG as you would any other liquid soap.  You can apply chg directly  to the skin and wash . Gently wash with scrungie or clean wascloth    5.  Apply the CHG Soap to your body ONLY FROM THE NECK DOWN.   Do not use on open                           Wound or open sores. Avoid contact with eyes, ears mouth and genitals (private parts).                        Genitals (private parts) with your normal soap.              6.  Wash thoroughly, paying special attention to the area where your surgery  will be performed.   7.  Thoroughly rinse your body with warm water from the neck down.   8.  DO NOT shower/wash with your normal soap after using and rinsing off  the CHG Soap .                9.  Pat yourself dry with a clean  towel.             10.  Wear clean night clothes to bed after shower             11.  Place clean sheets on your bed the night of your first shower and do not  sleep with pets.  Day of Surgery : Do not apply any lotions/deodorants the morning of surgery.  Please wear clean clothes to the hospital/surgery center.  FAILURE TO FOLLOW THESE INSTRUCTIONS MAY RESULT IN THE CANCELLATION OF YOUR SURGERY    PATIENT SIGNATURE_________________________________  ______________________________________________________________________     Ashley Villegas  An incentive spirometer is a tool that can help keep your lungs clear and active. This tool measures how well you are filling your lungs with each breath. Taking long deep breaths may help reverse or decrease the chance of developing breathing (pulmonary) problems (especially infection) following:  A long period of time when  you are unable to move or be active. BEFORE THE PROCEDURE   If the spirometer includes an indicator to show your best effort, your nurse or respiratory therapist will set it to a desired goal.  If possible, sit up straight or lean slightly forward. Try not to slouch.  Hold the incentive spirometer in an upright position. INSTRUCTIONS FOR USE   Sit on the edge of your bed if possible, or sit up as far as you can in bed or on a chair.  Hold the incentive spirometer in an upright position.  Breathe out normally.  Place the mouthpiece in your mouth and seal your lips tightly around it.  Breathe in slowly and as deeply as possible, raising the piston or the ball toward the top of the column.  Hold your breath for 3-5 seconds or for as long as possible. Allow the piston or ball to fall to the bottom of the column.  Remove the mouthpiece from your mouth and breathe out normally.  Rest for a few seconds and repeat Steps 1 through 7 at least 10 times every 1-2 hours when you are awake. Take your time and take a few  normal breaths between deep breaths.  The spirometer may include an indicator to show your best effort. Use the indicator as a goal to work toward during each repetition.  After each set of 10 deep breaths, practice coughing to be sure your lungs are clear. If you have an incision (the cut made at the time of surgery), support your incision when coughing by placing a pillow or rolled up towels firmly against it. Once you are able to get out of bed, walk around indoors and cough well. You may stop using the incentive spirometer when instructed by your caregiver.  RISKS AND COMPLICATIONS  Take your time so you do not get dizzy or light-headed.  If you are in pain, you may need to take or ask for pain medication before doing incentive spirometry. It is harder to take a deep breath if you are having pain. AFTER USE  Rest and breathe slowly and easily.  It can be helpful to keep track of a log of your progress. Your caregiver can provide you with a simple table to help with this. If you are using the spirometer at home, follow these instructions: [place] IF:   You are having difficultly using the spirometer.  You have trouble using the spirometer as often as instructed.  Your pain medication is not giving enough relief while using the spirometer.  You develop fever of 100.5 F (38.1 C) or higher. SEEK IMMEDIATE MEDICAL CARE IF:   You cough up bloody sputum that had not been present before.  You develop fever of 102 F (38.9 C) or greater.  You develop worsening pain at or near the incision site. MAKE SURE YOU:   Understand these instructions.  Will watch your condition.  Will get help right away if you are not doing well or get worse. Document Released: 07/23/2006 Document Revised: 06/04/2011 Document Reviewed: 09/23/2006 ExitCare Patient Information 2014 [employer], [place].   ________________________________________________________________________  WHAT IS A BLOOD  TRANSFUSION? Blood Transfusion Information  A transfusion is the replacement of blood or some of its parts. Blood is made up of multiple cells which provide different functions.  Red blood cells carry oxygen and are used for blood loss replacement.  White blood cells fight against infection.  Platelets control bleeding.  Plasma helps clot blood.  Other  blood products are available for specialized needs, such as hemophilia or other clotting disorders. BEFORE THE TRANSFUSION  Who gives blood for transfusions?   Healthy volunteers who are fully evaluated to make sure their blood is safe. This is blood bank blood. Transfusion therapy is the safest it has ever been in the practice of medicine. Before blood is taken from a donor, a complete history is taken to make sure that person has no history of diseases nor engages in risky social behavior (examples are intravenous drug use or sexual activity with multiple partners). The donor's travel history is screened to minimize risk of transmitting infections, such as malaria. The donated blood is tested for signs of infectious diseases, such as HIV and hepatitis. The blood is then tested to be sure it is compatible with you in order to minimize the chance of a transfusion reaction. If you or a relative donates blood, this is often done in anticipation of surgery and is not appropriate for emergency situations. It takes many days to process the donated blood. RISKS AND COMPLICATIONS Although transfusion therapy is very safe and saves many lives, the main dangers of transfusion include:   Getting an infectious disease.  Developing a transfusion reaction. This is an allergic reaction to something in the blood you were given. Every precaution is taken to prevent this. The decision to have a blood transfusion has been considered carefully by your caregiver before blood is given. Blood is not given unless the benefits outweigh the risks. AFTER THE  TRANSFUSION  Right after receiving a blood transfusion, you will usually feel much better and more energetic. This is especially true if your red blood cells have gotten low (anemic). The transfusion raises the level of the red blood cells which carry oxygen, and this usually causes an energy increase.  The nurse administering the transfusion will monitor you carefully for complications. HOME CARE INSTRUCTIONS  No special instructions are needed after a transfusion. You may find your energy is better. Speak with your caregiver about any limitations on activity for underlying diseases you may have. SEEK MEDICAL CARE IF:   Your condition is not improving after your transfusion.  You develop redness or irritation at the intravenous (IV) site. SEEK IMMEDIATE MEDICAL CARE IF:  Any of the following symptoms occur over the next 12 hours:  Shaking chills.  You have a temperature by mouth above 102 F (38.9 C), not controlled by medicine.  Chest, back, or muscle pain.  People around you feel you are not acting correctly or are confused.  Shortness of breath or difficulty breathing.  Dizziness and fainting.  You get a rash or develop hives.  You have a decrease in urine output.  Your urine turns a dark color or changes to pink, red, or brown. Any of the following symptoms occur over the next 10 days:  You have a temperature by mouth above 102 F (38.9 C), not controlled by medicine.  Shortness of breath.  Weakness after normal activity.  The white part of the eye turns yellow (jaundice).  You have a decrease in the amount of urine or are urinating less often.  Your urine turns a dark color or changes to pink, red, or brown. Document Released: 03/09/2000 Document Revised: 06/04/2011 Document Reviewed: 10/27/2007 [facility] Patient Information 2014 [place], [place].  _______________________________________________________________________

## 2014-10-20 ENCOUNTER — Encounter (HOSPITAL_COMMUNITY): Payer: Self-pay

## 2014-10-20 ENCOUNTER — Encounter (HOSPITAL_COMMUNITY)
Admission: RE | Admit: 2014-10-20 | Discharge: 2014-10-20 | Disposition: A | Discharge status: Home / Self Care | Payer: Managed Care, Other (non HMO) | Source: Ambulatory Visit | Attending: Gynecologic Oncology | Admitting: Gynecologic Oncology

## 2014-10-20 ENCOUNTER — Other Ambulatory Visit: Payer: Self-pay | Admitting: Gastroenterology

## 2014-10-20 ENCOUNTER — Ambulatory Visit (HOSPITAL_COMMUNITY)
Admission: RE | Admit: 2014-10-20 | Discharge: 2014-10-20 | Disposition: A | Discharge status: Home / Self Care | Payer: Managed Care, Other (non HMO) | Source: Ambulatory Visit | Attending: Gynecologic Oncology | Admitting: Gynecologic Oncology

## 2014-10-20 DIAGNOSIS — Z87891 Personal history of nicotine dependence: Secondary | ICD-10-CM | POA: Diagnosis not present

## 2014-10-20 DIAGNOSIS — Z01818 Encounter for other preprocedural examination: Secondary | ICD-10-CM | POA: Diagnosis not present

## 2014-10-20 DIAGNOSIS — C541 Malignant neoplasm of endometrium: Secondary | ICD-10-CM

## 2014-10-20 DIAGNOSIS — Z8542 Personal history of malignant neoplasm of other parts of uterus: Secondary | ICD-10-CM | POA: Diagnosis not present

## 2014-10-20 HISTORY — DX: Adverse effect of unspecified anesthetic, initial encounter: T41.45XA

## 2014-10-20 HISTORY — DX: Other specified postprocedural states: R11.2

## 2014-10-20 HISTORY — DX: Nausea with vomiting, unspecified: Z98.890

## 2014-10-20 HISTORY — DX: Malignant (primary) neoplasm, unspecified: C80.1

## 2014-10-20 HISTORY — DX: Other complications of anesthesia, initial encounter: T88.59XA

## 2014-10-20 LAB — URINALYSIS, ROUTINE W REFLEX MICROSCOPIC
Bilirubin Urine: NEGATIVE
Glucose, UA: NEGATIVE mg/dL
Ketones, ur: NEGATIVE mg/dL
NITRITE: NEGATIVE
Protein, ur: NEGATIVE mg/dL
Specific Gravity, Urine: 1.022 (ref 1.005–1.030)
Urobilinogen, UA: 0.2 mg/dL (ref 0.0–1.0)
pH: 6 (ref 5.0–8.0)

## 2014-10-20 LAB — CBC WITH DIFFERENTIAL/PLATELET
BASOS ABS: 0.1 10*3/uL (ref 0.0–0.1)
BASOS PCT: 1 % (ref 0–1)
EOS ABS: 0.3 10*3/uL (ref 0.0–0.7)
Eosinophils Relative: 5 % (ref 0–5)
HEMATOCRIT: 43.9 % (ref 36.0–46.0)
HEMOGLOBIN: 14.8 g/dL (ref 12.0–15.0)
Lymphocytes Relative: 25 % (ref 12–46)
Lymphs Abs: 1.6 10*3/uL (ref 0.7–4.0)
MCH: 30.2 pg (ref 26.0–34.0)
MCHC: 33.7 g/dL (ref 30.0–36.0)
MCV: 89.6 fL (ref 78.0–100.0)
MONO ABS: 0.6 10*3/uL (ref 0.1–1.0)
MONOS PCT: 9 % (ref 3–12)
NEUTROS PCT: 60 % (ref 43–77)
Neutro Abs: 3.8 10*3/uL (ref 1.7–7.7)
Platelets: 237 10*3/uL (ref 150–400)
RBC: 4.9 MIL/uL (ref 3.87–5.11)
RDW: 14 % (ref 11.5–15.5)
WBC: 6.4 10*3/uL (ref 4.0–10.5)

## 2014-10-20 LAB — COMPREHENSIVE METABOLIC PANEL
ALT: 33 U/L (ref 14–54)
AST: 26 U/L (ref 15–41)
Albumin: 4.4 g/dL (ref 3.5–5.0)
Alkaline Phosphatase: 66 U/L (ref 38–126)
Anion gap: 9 (ref 5–15)
BUN: 12 mg/dL (ref 6–20)
CHLORIDE: 104 mmol/L (ref 101–111)
CO2: 27 mmol/L (ref 22–32)
CREATININE: 0.91 mg/dL (ref 0.44–1.00)
Calcium: 9.6 mg/dL (ref 8.9–10.3)
GFR calc non Af Amer: >60 mL/min (ref >60)
Glucose, Bld: 91 mg/dL (ref 65–99)
POTASSIUM: 4 mmol/L (ref 3.5–5.1)
Sodium: 140 mmol/L (ref 135–145)
TOTAL PROTEIN: 7.5 g/dL (ref 6.5–8.1)
Total Bilirubin: 1.2 mg/dL (ref 0.3–1.2)

## 2014-10-20 LAB — URINE MICROSCOPIC-ADD ON

## 2014-10-20 LAB — ABO/RH: ABO/RH(D): A POS

## 2014-10-20 NOTE — Progress Notes (Signed)
Abnormal UA faxed to Joylene John NP

## 2014-10-26 ENCOUNTER — Ambulatory Visit (HOSPITAL_COMMUNITY)
Admission: RE | Admit: 2014-10-26 | Discharge: 2014-10-27 | Disposition: A | Discharge status: Home / Self Care | Payer: Managed Care, Other (non HMO) | Source: Ambulatory Visit | Attending: Obstetrics & Gynecology | Admitting: Obstetrics & Gynecology

## 2014-10-26 ENCOUNTER — Ambulatory Visit (HOSPITAL_COMMUNITY): Payer: Managed Care, Other (non HMO) | Admitting: Anesthesiology

## 2014-10-26 ENCOUNTER — Encounter (HOSPITAL_COMMUNITY): Payer: Self-pay | Admitting: Anesthesiology

## 2014-10-26 ENCOUNTER — Encounter (HOSPITAL_COMMUNITY)
Admission: RE | Disposition: A | Discharge status: Home / Self Care | Payer: Self-pay | Source: Ambulatory Visit | Attending: Obstetrics & Gynecology

## 2014-10-26 DIAGNOSIS — N950 Postmenopausal bleeding: Secondary | ICD-10-CM | POA: Insufficient documentation

## 2014-10-26 DIAGNOSIS — N800 Endometriosis of uterus: Secondary | ICD-10-CM | POA: Diagnosis not present

## 2014-10-26 DIAGNOSIS — F419 Anxiety disorder, unspecified: Secondary | ICD-10-CM | POA: Insufficient documentation

## 2014-10-26 DIAGNOSIS — Z79899 Other long term (current) drug therapy: Secondary | ICD-10-CM | POA: Diagnosis not present

## 2014-10-26 DIAGNOSIS — Z87891 Personal history of nicotine dependence: Secondary | ICD-10-CM | POA: Insufficient documentation

## 2014-10-26 DIAGNOSIS — K219 Gastro-esophageal reflux disease without esophagitis: Secondary | ICD-10-CM | POA: Diagnosis not present

## 2014-10-26 DIAGNOSIS — F329 Major depressive disorder, single episode, unspecified: Secondary | ICD-10-CM | POA: Diagnosis not present

## 2014-10-26 DIAGNOSIS — N838 Other noninflammatory disorders of ovary, fallopian tube and broad ligament: Secondary | ICD-10-CM | POA: Insufficient documentation

## 2014-10-26 DIAGNOSIS — E039 Hypothyroidism, unspecified: Secondary | ICD-10-CM | POA: Diagnosis not present

## 2014-10-26 DIAGNOSIS — C541 Malignant neoplasm of endometrium: Secondary | ICD-10-CM | POA: Diagnosis present

## 2014-10-26 DIAGNOSIS — Z888 Allergy status to other drugs, medicaments and biological substances status: Secondary | ICD-10-CM | POA: Insufficient documentation

## 2014-10-26 DIAGNOSIS — M1990 Unspecified osteoarthritis, unspecified site: Secondary | ICD-10-CM | POA: Diagnosis not present

## 2014-10-26 HISTORY — PX: LYMPH NODE DISSECTION: SHX5087

## 2014-10-26 HISTORY — PX: ROBOTIC ASSISTED TOTAL HYSTERECTOMY WITH BILATERAL SALPINGO OOPHERECTOMY: SHX6086

## 2014-10-26 LAB — TYPE AND SCREEN
ABO/RH(D): A POS
Antibody Screen: NEGATIVE

## 2014-10-26 SURGERY — ROBOTIC ASSISTED TOTAL HYSTERECTOMY WITH BILATERAL SALPINGO OOPHORECTOMY
Anesthesia: General

## 2014-10-26 MED ORDER — EPHEDRINE SULFATE 50 MG/ML IJ SOLN
INTRAMUSCULAR | Status: AC
  Filled 2014-10-26: qty 1

## 2014-10-26 MED ORDER — LACTATED RINGERS IV SOLN
INTRAVENOUS | Status: DC | PRN
  Administered 2014-10-26: 1000 mL via INTRAVENOUS

## 2014-10-26 MED ORDER — HYDROMORPHONE HCL 1 MG/ML IJ SOLN
INTRAMUSCULAR | Status: DC | PRN
  Administered 2014-10-26 (×2): 1 mg via INTRAVENOUS

## 2014-10-26 MED ORDER — GLYCOPYRROLATE 0.2 MG/ML IJ SOLN
INTRAMUSCULAR | Status: AC
  Filled 2014-10-26: qty 3

## 2014-10-26 MED ORDER — LIDOCAINE HCL (PF) 2 % IJ SOLN
INTRAMUSCULAR | Status: DC | PRN
  Administered 2014-10-26: 30 mg via INTRADERMAL

## 2014-10-26 MED ORDER — EPHEDRINE SULFATE 50 MG/ML IJ SOLN
INTRAMUSCULAR | Status: DC | PRN
  Administered 2014-10-26: 10 mg via INTRAVENOUS

## 2014-10-26 MED ORDER — HYDROMORPHONE HCL 1 MG/ML IJ SOLN: 0.2000 mg | INTRAMUSCULAR | Status: DC | PRN

## 2014-10-26 MED ORDER — DEXAMETHASONE SODIUM PHOSPHATE 10 MG/ML IJ SOLN
INTRAMUSCULAR | Status: AC
  Filled 2014-10-26: qty 1

## 2014-10-26 MED ORDER — FENTANYL CITRATE (PF) 250 MCG/5ML IJ SOLN
INTRAMUSCULAR | Status: AC
  Filled 2014-10-26: qty 25

## 2014-10-26 MED ORDER — SCOPOLAMINE 1 MG/3DAYS TD PT72
1.0000 | MEDICATED_PATCH | Freq: Once | TRANSDERMAL | Status: DC
  Administered 2014-10-26: 1.5 mg via TRANSDERMAL

## 2014-10-26 MED ORDER — KCL IN DEXTROSE-NACL 20-5-0.45 MEQ/L-%-% IV SOLN
INTRAVENOUS | Status: DC
  Administered 2014-10-26: 20:00:00 via INTRAVENOUS
  Filled 2014-10-26 (×2): qty 1000

## 2014-10-26 MED ORDER — OXYCODONE-ACETAMINOPHEN 5-325 MG PO TABS: 1.0000 | ORAL_TABLET | ORAL | Status: DC | PRN

## 2014-10-26 MED ORDER — ONDANSETRON HCL 4 MG/2ML IJ SOLN
INTRAMUSCULAR | Status: AC
  Filled 2014-10-26: qty 2

## 2014-10-26 MED ORDER — SCOPOLAMINE 1 MG/3DAYS TD PT72
MEDICATED_PATCH | TRANSDERMAL | Status: AC
  Filled 2014-10-26: qty 1

## 2014-10-26 MED ORDER — ONDANSETRON HCL 4 MG/2ML IJ SOLN: 4.0000 mg | Freq: Four times a day (QID) | INTRAMUSCULAR | Status: DC | PRN

## 2014-10-26 MED ORDER — MIDAZOLAM HCL 2 MG/2ML IJ SOLN
INTRAMUSCULAR | Status: AC
Start: 2014-10-26 — End: 2014-10-26
  Filled 2014-10-26: qty 4

## 2014-10-26 MED ORDER — PROPOFOL 10 MG/ML IV BOLUS
INTRAVENOUS | Status: AC
  Filled 2014-10-26: qty 20

## 2014-10-26 MED ORDER — ROCURONIUM BROMIDE 100 MG/10ML IV SOLN
INTRAVENOUS | Status: DC | PRN
  Administered 2014-10-26: 10 mg via INTRAVENOUS
  Administered 2014-10-26: 50 mg via INTRAVENOUS

## 2014-10-26 MED ORDER — HYDROMORPHONE HCL 2 MG/ML IJ SOLN
INTRAMUSCULAR | Status: AC
  Filled 2014-10-26: qty 1

## 2014-10-26 MED ORDER — LEVOTHYROXINE SODIUM 100 MCG PO TABS
100.0000 ug | ORAL_TABLET | ORAL | Status: DC
  Administered 2014-10-27: 100 ug via ORAL
  Filled 2014-10-26 (×2): qty 1

## 2014-10-26 MED ORDER — ENOXAPARIN SODIUM 40 MG/0.4ML ~~LOC~~ SOLN
40.0000 mg | SUBCUTANEOUS | Status: AC
  Administered 2014-10-26: 40 mg via SUBCUTANEOUS
  Filled 2014-10-26: qty 0.4

## 2014-10-26 MED ORDER — HYDROMORPHONE HCL 1 MG/ML IJ SOLN
INTRAMUSCULAR | Status: AC
Start: 2014-10-26 — End: 2014-10-27
  Filled 2014-10-26: qty 1

## 2014-10-26 MED ORDER — LACTATED RINGERS IV SOLN
INTRAVENOUS | Status: DC
  Administered 2014-10-26: 1000 mL via INTRAVENOUS
  Administered 2014-10-26: 17:00:00 via INTRAVENOUS

## 2014-10-26 MED ORDER — PROPOFOL 10 MG/ML IV BOLUS
INTRAVENOUS | Status: DC | PRN
  Administered 2014-10-26: 150 mg via INTRAVENOUS

## 2014-10-26 MED ORDER — ONDANSETRON HCL 4 MG PO TABS: 4.0000 mg | ORAL_TABLET | Freq: Four times a day (QID) | ORAL | Status: DC | PRN

## 2014-10-26 MED ORDER — ENOXAPARIN SODIUM 40 MG/0.4ML ~~LOC~~ SOLN
40.0000 mg | SUBCUTANEOUS | Status: DC
  Administered 2014-10-27: 40 mg via SUBCUTANEOUS
  Filled 2014-10-26 (×2): qty 0.4

## 2014-10-26 MED ORDER — CEFAZOLIN SODIUM-DEXTROSE 2-3 GM-% IV SOLR
INTRAVENOUS | Status: AC
  Filled 2014-10-26: qty 50

## 2014-10-26 MED ORDER — LEVOTHYROXINE SODIUM 88 MCG PO TABS: 88.0000 ug | ORAL_TABLET | ORAL | Status: DC

## 2014-10-26 MED ORDER — MIDAZOLAM HCL 5 MG/5ML IJ SOLN
INTRAMUSCULAR | Status: DC | PRN
  Administered 2014-10-26: 2 mg via INTRAVENOUS

## 2014-10-26 MED ORDER — IBUPROFEN 800 MG PO TABS
800.0000 mg | ORAL_TABLET | Freq: Three times a day (TID) | ORAL | Status: DC | PRN
  Administered 2014-10-27: 800 mg via ORAL
  Filled 2014-10-26 (×2): qty 1

## 2014-10-26 MED ORDER — GLYCOPYRROLATE 0.2 MG/ML IJ SOLN
INTRAMUSCULAR | Status: DC | PRN
  Administered 2014-10-26: 0.6 mg via INTRAVENOUS
  Administered 2014-10-26: 0.2 mg via INTRAVENOUS

## 2014-10-26 MED ORDER — NEOSTIGMINE METHYLSULFATE 10 MG/10ML IV SOLN
INTRAVENOUS | Status: DC | PRN
  Administered 2014-10-26: 3.5 mg via INTRAVENOUS

## 2014-10-26 MED ORDER — PROMETHAZINE HCL 25 MG/ML IJ SOLN
6.2500 mg | INTRAMUSCULAR | Status: DC | PRN
Start: 2014-10-26 — End: 2014-10-26

## 2014-10-26 MED ORDER — DEXAMETHASONE SODIUM PHOSPHATE 10 MG/ML IJ SOLN
INTRAMUSCULAR | Status: DC | PRN
  Administered 2014-10-26: 10 mg via INTRAVENOUS

## 2014-10-26 MED ORDER — HYDROMORPHONE HCL 1 MG/ML IJ SOLN
0.2500 mg | INTRAMUSCULAR | Status: DC | PRN
  Administered 2014-10-26 (×2): 0.5 mg via INTRAVENOUS

## 2014-10-26 MED ORDER — FENTANYL CITRATE (PF) 100 MCG/2ML IJ SOLN
INTRAMUSCULAR | Status: DC | PRN
  Administered 2014-10-26: 100 ug via INTRAVENOUS

## 2014-10-26 MED ORDER — ONDANSETRON HCL 4 MG/2ML IJ SOLN
INTRAMUSCULAR | Status: DC | PRN
  Administered 2014-10-26: 4 mg via INTRAVENOUS

## 2014-10-26 MED ORDER — FLUOXETINE HCL 20 MG PO CAPS
40.0000 mg | ORAL_CAPSULE | Freq: Every evening | ORAL | Status: DC
  Administered 2014-10-26: 40 mg via ORAL
  Filled 2014-10-26 (×2): qty 2

## 2014-10-26 MED ORDER — LIDOCAINE HCL (CARDIAC) 20 MG/ML IV SOLN
INTRAVENOUS | Status: AC
  Filled 2014-10-26: qty 5

## 2014-10-26 MED ORDER — ROCURONIUM BROMIDE 100 MG/10ML IV SOLN
INTRAVENOUS | Status: AC
  Filled 2014-10-26: qty 1

## 2014-10-26 MED ORDER — 0.9 % SODIUM CHLORIDE (POUR BTL) OPTIME
TOPICAL | Status: DC | PRN
  Administered 2014-10-26: 1000 mL

## 2014-10-26 MED ORDER — SODIUM CHLORIDE 0.9 % IJ SOLN
INTRAMUSCULAR | Status: AC
  Filled 2014-10-26: qty 10

## 2014-10-26 MED ORDER — CEFAZOLIN SODIUM-DEXTROSE 2-3 GM-% IV SOLR
2.0000 g | INTRAVENOUS | Status: AC
  Administered 2014-10-26: 2 g via INTRAVENOUS

## 2014-10-26 MED ORDER — LORATADINE 10 MG PO TABS
10.0000 mg | ORAL_TABLET | Freq: Every evening | ORAL | Status: DC
  Filled 2014-10-26 (×2): qty 1

## 2014-10-26 MED ORDER — STERILE WATER FOR IRRIGATION IR SOLN
Status: DC | PRN
  Administered 2014-10-26: 1000 mL

## 2014-10-26 SURGICAL SUPPLY — 57 items
APL SKNCLS STERI-STRIP NONHPOA (GAUZE/BANDAGES/DRESSINGS) ×2
BAG SPEC RTRVL LRG 6X4 10 (ENDOMECHANICALS) ×2
BENZOIN TINCTURE PRP APPL 2/3 (GAUZE/BANDAGES/DRESSINGS) ×4 IMPLANT
CABLE HIGH FREQUENCY MONO STRZ (ELECTRODE) ×4 IMPLANT
CHLORAPREP W/TINT 26ML (MISCELLANEOUS) ×4 IMPLANT
CLOSURE WOUND 1/2 X4 (GAUZE/BANDAGES/DRESSINGS) ×1
CORDS BIPOLAR (ELECTRODE) ×4 IMPLANT
COVER SURGICAL LIGHT HANDLE (MISCELLANEOUS) ×4 IMPLANT
COVER TIP SHEARS 8 DVNC (MISCELLANEOUS) ×2 IMPLANT
COVER TIP SHEARS 8MM DA VINCI (MISCELLANEOUS) ×2
DRAPE SHEET LG 3/4 BI-LAMINATE (DRAPES) ×8 IMPLANT
DRAPE SURG IRRIG POUCH 19X23 (DRAPES) ×4 IMPLANT
DRAPE TABLE BACK 44X90 PK DISP (DRAPES) ×6 IMPLANT
DRAPE UTILITY XL STRL (DRAPES) ×4 IMPLANT
DRAPE WARM FLUID 44X44 (DRAPE) ×4 IMPLANT
DRSG TEGADERM 2-3/8X2-3/4 SM (GAUZE/BANDAGES/DRESSINGS) ×8 IMPLANT
DRSG TEGADERM 4X4.75 (GAUZE/BANDAGES/DRESSINGS) ×4 IMPLANT
DRSG TEGADERM 6X8 (GAUZE/BANDAGES/DRESSINGS) ×4 IMPLANT
ELECT REM PT RETURN 9FT ADLT (ELECTROSURGICAL) ×4
ELECTRODE REM PT RTRN 9FT ADLT (ELECTROSURGICAL) ×2 IMPLANT
GAUZE SPONGE 2X2 8PLY STRL LF (GAUZE/BANDAGES/DRESSINGS) ×2 IMPLANT
GLOVE BIO SURGEON STRL SZ 6.5 (GLOVE) ×6 IMPLANT
GLOVE BIO SURGEON STRL SZ7.5 (GLOVE) ×8 IMPLANT
GLOVE BIO SURGEONS STRL SZ 6.5 (GLOVE) ×2
GLOVE BIOGEL PI IND STRL 7.0 (GLOVE) ×4 IMPLANT
GLOVE BIOGEL PI INDICATOR 7.0 (GLOVE) ×4
GOWN STRL REUS W/ TWL XL LVL3 (GOWN DISPOSABLE) ×4 IMPLANT
GOWN STRL REUS W/TWL XL LVL3 (GOWN DISPOSABLE) ×12
HOLDER FOLEY CATH W/STRAP (MISCELLANEOUS) ×4 IMPLANT
KIT ACCESSORY DA VINCI DISP (KITS) ×2
KIT ACCESSORY DVNC DISP (KITS) ×2 IMPLANT
KIT BASIN OR (CUSTOM PROCEDURE TRAY) ×4 IMPLANT
MANIPULATOR UTERINE 4.5 ZUMI (MISCELLANEOUS) ×4 IMPLANT
OCCLUDER COLPOPNEUMO (BALLOONS) ×4 IMPLANT
POUCH SPECIMEN RETRIEVAL 10MM (ENDOMECHANICALS) ×6 IMPLANT
SET TUBE IRRIG SUCTION NO TIP (IRRIGATION / IRRIGATOR) ×4 IMPLANT
SHEET LAVH (DRAPES) ×4 IMPLANT
SOLUTION ANTI FOG 6CC (MISCELLANEOUS) ×4 IMPLANT
SOLUTION ELECTROLUBE (MISCELLANEOUS) ×4 IMPLANT
SPONGE GAUZE 2X2 STER 10/PKG (GAUZE/BANDAGES/DRESSINGS) ×2
SPONGE LAP 18X18 X RAY DECT (DISPOSABLE) IMPLANT
STRIP CLOSURE SKIN 1/2X4 (GAUZE/BANDAGES/DRESSINGS) ×3 IMPLANT
SUT VIC AB 0 CT1 27 (SUTURE) ×8
SUT VIC AB 0 CT1 27XBRD ANTBC (SUTURE) ×6 IMPLANT
SUT VIC AB 4-0 PS2 27 (SUTURE) ×8 IMPLANT
SUT VICRYL 0 UR6 27IN ABS (SUTURE) ×2 IMPLANT
SYR 50ML LL SCALE MARK (SYRINGE) ×4 IMPLANT
SYR BULB IRRIGATION 50ML (SYRINGE) IMPLANT
TOWEL OR 17X26 10 PK STRL BLUE (TOWEL DISPOSABLE) ×8 IMPLANT
TRAP SPECIMEN MUCOUS 40CC (MISCELLANEOUS) ×2 IMPLANT
TRAY FOLEY W/METER SILVER 14FR (SET/KITS/TRAYS/PACK) ×4 IMPLANT
TRAY LAPAROSCOPIC (CUSTOM PROCEDURE TRAY) ×4 IMPLANT
TROCAR 12M 150ML BLUNT (TROCAR) ×4 IMPLANT
TROCAR BLADELESS OPT 5 100 (ENDOMECHANICALS) ×4 IMPLANT
TROCAR XCEL 12X100 BLDLESS (ENDOMECHANICALS) ×4 IMPLANT
TUBING INSUFFLATION 10FT LAP (TUBING) ×4 IMPLANT
WATER STERILE IRR 1500ML POUR (IV SOLUTION) ×6 IMPLANT

## 2014-10-26 NOTE — H&P (View-Only) (Signed)
Consult Note: Gyn-Onc  Consult was requested by Dr. Nelda Marseille for the evaluation of roneshia drew 64 y.o. female  CC:  Chief Complaint  Patient presents with  . endometrial adenocarcinoma    Assessment/Plan:  Ms. vergene marland  is a 65 y.o.  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   A detailed discussion was held with the patient and her family with regard to to her endometrial cancer diagnosis. We discussed the standard management options for uterine cancer which includes surgery followed possibly by adjuvant therapy depending on the results of surgery. The options for surgical management include a hysterectomy and removal of the tubes and ovaries possibly with removal of pelvic and para-aortic lymph nodes. A minimally invasive approach including a robotic hysterectomy or laparoscopic hysterectomy have benefits including shorter hospital stay, recovery time and better wound healing. The alternative approach is an open hysterectomy. The patient has been counseled about these surgical options and the risks of surgery in general including infection, bleeding, damage to surrounding structures (including bowel, bladder, ureters, nerves or vessels), and the postoperative risks of PE/ DVT, and lymphedema. I extensively reviewed the additional risks of robotic hysterectomy including possible need for conversion to open laparotomy.  I discussed positioning during surgery of trendelenberg and risks of minor facial swelling and care we take in preoperative positioning.  After counseling and consideration of her options, she desires to proceed with robotic hysterectomy, BSO, possible lymphadenectomy.   She will be seen by anesthesia for preoperative clearance and discussion of postoperative pain management.  She was given the opportunity to ask questions, which were answered to her satisfaction, and she is agreement with the above mentioned plan of care.  HPI: [place] Searles is a 64 year old gravida 2 para 2 who is seen in consultation at the request of Dr. Nelda Marseille for grade 1 endometrioid endometrial adenocarcinoma. The patient reports having postmenopausal bleeding for a proximally 7 months. It is irregular, and mostly spotting. She was evaluated by Dr. Ted Mcalpine for this who performed a D&C procedure on 09/15/2014 after a prior ultrasound scan on 08/17/2014 revealed a normal size uterus measuring 5 x 8.5 x 6.4 cm but with a 2 cm thickened endometrial stripe. This prompted the sampling which revealed a well-differentiated adenocarcinoma FIGO grade 1  She is otherwise healthy woman who only takes Synthroid for hypothyroidism. She has had recent rectal bleeding and is scheduling a colonoscopy for a diagnostic workup of this. She has no family history for colon, Uterine, ovary, or breast cancer. She is no other major risk factors for endometrial cancer at the sides a BMI of 32 kg/m.  Her only prior surgeries on the abdomen is a cesarean section, tubal ligation, and several laparoscopic surgery for ovarian cysts.  Current Meds:  Outpatient Encounter Prescriptions as of 09/30/2014  Medication Sig  . cetirizine (ZYRTEC) 10 MG tablet Take 10 mg by mouth daily.  Marland Kitchen FLUoxetine (PROZAC) 40 MG capsule Take 40 mg by mouth daily.  Marland Kitchen levothyroxine (SYNTHROID, LEVOTHROID) 100 MCG tablet Take 100 mcg by mouth daily before breakfast. Patient takes mon - fri  . levothyroxine (SYNTHROID, LEVOTHROID) 88 MCG tablet Take 88 mcg by mouth daily before breakfast. Patient takes sat and sun  . furosemide (LASIX) 40 MG tablet Take 40 mg by mouth daily.   No facility-administered encounter medications on file as of 09/30/2014.    Allergy:  Allergies  Allergen Reactions  . Hydrocortisone Rash    Social Hx:  History   Social History  . Marital Status: Single    Spouse Name: N/A  . Number of Children: N/A  . Years of Education: N/A   Occupational History  . Not on file.   Social  History Main Topics  . Smoking status: Former Smoker -- 0.50 packs/day for 13 years    Types: Cigarettes  . Smokeless tobacco: Never Used  . Alcohol Use: No  . Drug Use: No  . Sexual Activity: Not on file   Other Topics Concern  . Not on file   Social History Narrative    Past Surgical Hx:  Past Surgical History  Procedure Laterality Date  . Diagnostic laparoscopy      fibroid tumors on her ovaries   . Tubal ligation    . Cesarean section    . Dilation and curettage of uterus    . Wisdom tooth extraction    . Knee surgery    . Hysteroscopy w/d&c N/A 09/15/2014    Procedure: DILATATION AND CURETTAGE /HYSTEROSCOPY/MYOSURE RESECTION OF POLYP;  Surgeon: Janyth Pupa, DO;  Location: [facility];  Service: Gynecology;  Laterality: N/A;    Past Medical Hx:  Past Medical History  Diagnosis Date  . Hypothyroidism     on synthroid   . Depression   . Anxiety   . GERD (gastroesophageal reflux disease)   . Arthritis     Past Gynecological History:  C/s and SVD.  No LMP recorded.  Family Hx: History reviewed. No pertinent family history.  Review of Systems:  Constitutional  Feels well,   ENT Normal appearing ears and nares bilaterally Skin/Breast  No rash, sores, jaundice, itching, dryness Cardiovascular  No chest pain, shortness of breath, or edema  Pulmonary  No cough or wheeze.  Gastro Intestinal  No nausea, vomitting, or diarrhoea. No bright red blood per rectum, no abdominal pain, change in bowel movement, or constipation.  Genito Urinary  No frequency, urgency, dysuria, see HPI Musculo Skeletal  No myalgia, arthralgia, joint swelling or pain  Neurologic  No weakness, numbness, change in gait,  Psychology  No depression, anxiety, insomnia.   Vitals:  Blood pressure 135/63, pulse 66, temperature 98 F (36.7 C), temperature source Oral, resp. rate 20, height 5' 0.5" (1.537 m), weight 168 lb 9.6 oz (76.476 kg), SpO2 97 %.  Physical Exam: WD in NAD Neck  Supple  NROM, without any enlargements.  Lymph Node Survey No cervical supraclavicular or inguinal adenopathy Cardiovascular  Pulse normal rate, regularity and rhythm. S1 and S2 normal.  Lungs  Clear to auscultation bilateraly, without wheezes/crackles/rhonchi. Good air movement.  Skin  No rash/lesions/breakdown  Psychiatry  Alert and oriented to person, place, and time  Abdomen  Normoactive bowel sounds, abdomen soft, non-tender and obese without evidence of hernia.  Back No CVA tenderness Genito Urinary  Vulva/vagina: Normal external female genitalia.   No lesions. No discharge or bleeding.  Bladder/urethra:  No lesions or masses, well supported bladder  Vagina:   Cervix: Normal appearing, no lesions.  Uterus: slightly enlarged, 10 week size mobile, no parametrial involvement or nodularity.  Adnexa: no palpable masses. Rectal  Good tone, no masses no cul de sac nodularity.  Extremities  No bilateral cyanosis, clubbing or edema.  Donaciano Eva, MD   09/30/2014, 5:38 PM

## 2014-10-26 NOTE — Anesthesia Postprocedure Evaluation (Signed)
  Anesthesia Post-op Note  Patient: Ashley Villegas  Procedure(s) Performed: Procedure(s): ROBOTIC ASSISTED TOTAL HYSTERECTOMY WITH BILATERAL SALPINGO OOPHORECTOMY (Bilateral) POSS LYMPHADENECTOMY  (N/A)  Patient Location: PACU  Anesthesia Type:General  Level of Consciousness: awake  Airway and Oxygen Therapy: Patient Spontanous Breathing  Post-op Pain: mild  Post-op Assessment: Post-op Vital signs reviewed              Post-op Vital Signs: Reviewed  Last Vitals:  Filed Vitals:   10/26/14 1815  BP: 114/61  Pulse: 80  Temp:   Resp: 14    Complications: No apparent anesthesia complications

## 2014-10-26 NOTE — Anesthesia Preprocedure Evaluation (Addendum)
Anesthesia Evaluation  Patient identified by MRN, date of birth, ID band Patient awake    Reviewed: Allergy & Precautions, NPO status , Patient's Chart, lab work & pertinent test results  History of Anesthesia Complications (+) PONV and history of anesthetic complications  Airway Mallampati: II  TM Distance: >3 FB Neck ROM: Full    Dental no notable dental hx.    Pulmonary former smoker,  CXR: Chronic bronchitic changes. breath sounds clear to auscultation  Pulmonary exam normal       Cardiovascular Exercise Tolerance: Good negative cardio ROS Normal cardiovascular examRhythm:Regular Rate:Normal  ECG: NSR, low voltage.   Neuro/Psych PSYCHIATRIC DISORDERS Anxiety Depression negative neurological ROS     GI/Hepatic Neg liver ROS, GERD-  ,  Endo/Other  Hypothyroidism   Renal/GU negative Renal ROS  negative genitourinary   Musculoskeletal  (+) Arthritis -,   Abdominal (+) + obese,   Peds negative pediatric ROS (+)  Hematology negative hematology ROS (+)   Anesthesia Other Findings   Reproductive/Obstetrics negative OB ROS                           Anesthesia Physical Anesthesia Plan  ASA: II  Anesthesia Plan: General   Post-op Pain Management:    Induction: Intravenous  Airway Management Planned: Oral ETT  Additional Equipment:   Intra-op Plan:   Post-operative Plan: Extubation in OR  Informed Consent: I have reviewed the patients History and Physical, chart, labs and discussed the procedure including the risks, benefits and alternatives for the proposed anesthesia with the patient or authorized representative who has indicated his/her understanding and acceptance.   Dental advisory given  Plan Discussed with: CRNA  Anesthesia Plan Comments:         Anesthesia Quick Evaluation

## 2014-10-26 NOTE — Op Note (Signed)
PATIENT: Ashley Villegas DATE OF BIRTH: [birth year] ENCOUNTER DATE: 10/26/14   Preop Diagnosis: Grade 1 endometrioid adenocarcinoma.   Postoperative Diagnosis: same.   Surgery: Total robotic hysterectomy, bilateral salpingo-oophorectomy, bilateral pelvic lymph node dissection.   Surgeons:  Lucita Lora. Alycia Rossetti, MD; Lahoma Crocker, MD   Anesthesia: General   Estimated blood loss: 25  ml   IVF: 1200  ml   Urine output: 947  ml   Complications: None   Pathology: Uterus, cervix, bilateral tubes and ovaries, bilateral pelvic lymph nodes to pathology.   Operative findings: Omentum adherent to midline vertical incision. Fibroid uterus. Normal adnexa. Adhesions of anterior bladder to uterus. Frozen section with grade 1 cancer focally involving adenomyosis, minimal myometrial invasion.  Procedure: The patient was identified in the preoperative holding area. Informed consent was signed on the chart. Patient was seen history was reviewed and exam was performed.   The patient was then taken to the operating room and placed in the supine position with SCD hose on. She was then placed in the dorsolithotomy position. Her arms were tucked at her side with appropriate precautions on the gel pad. General anesthesia was then induced without difficulty. Shoulder blocks were then placed in the usual fashion with appropriate precautions. A OG-tube was placed to suction. First timeout was performed to confirm the patient, procedure, antibiotic, allergy status, estimated blood loss and OR time. The perineum was then prepped in the usual fashion with Betadine. A 14 French Foley was inserted into the bladder under sterile conditions. A sterile speculum was placed in the vagina. The cervix was without lesions. The cervix was grasped with a single-tooth tenaculum. The dilator without difficulty. A ZUMI with a medium KOH ring was placed without difficulty. The abdomen was then prepped with 1 Chlor prep sponge per  protocol.   Patient was then draped after the prep was dried. Second timeout was performed to confirm the above. After again confirming OG tube placement and it was to suction. A stab-wound was made in left upper quadrant 2 cm below the costal margin on the left in the midclavicular line. A 5 mm operative report was used to assure intra-abdominal placement. The abdomen was insufflated. At this point all points during the procedure the patient's intra-abdominal pressure was not increased over 15 mm of mercury. After insufflation was complete, the patient was placed in deep Trendelenburg position. 25 cm above the pubic symphysis that area was marked the camera port. Bilateral robotic ports were marked 10 cm from the midline incision at approximately 5 angle. Under direct visualization each of the trochars was placed into the abdomen. The omental adhesions were taken down with sharp dissection with laparoscopic scissors. The small bowel was folded on its mesentery to allow visualization to the pelvis. The 5 mm LUQ port was then converted to a 10/12 port under direct visualization.  After assuring adequate visualization, the robot was then docked in the usual fashion. Under direct visualization the robotic instruments replaced.   The round ligament on the patient's right side was transected with monopolar cautery. The anterior and posterior leaves of the broad ligament were then taken down in the usual fashion. The ureter was identified on the medial leaf of the broad ligament. A window was made between the IP and the ureter. The IP was coagulated with bipolar cautery and transected. The posterior leaf of the broad ligament was taken down to the level of the KOH ring. The bladder flap was created using meticulous dissection and pinpoint  cautery. The uterine vessels were coagulated with bipolar cautery. The uterine vessels were then transected and the C loop was created. The same procedure was performed on the  patient's left side.   The pneumo-occulder in the vagina was then insufflated. The colpotomy was then created in the usual fashion. The specimen was then delivered to the vagina and sent for frozen section. Our attention was then drawn to opening the paravesical space on her right side the perirectal space was also opened. The obturator nerve was identified. The nodal bundle extending over the external iliac artery down to the external iliac vein was taken down using sharp dissection and monopolar cautery. The genitofemoral nerve was identified and spared. We continued our dissection down to the level of the obturator nerve. The nodal bundle superior to the obturator nerve was taken. All pedicles were noted to be hemostatic the ureter was noted to be well medial of the area of dissection. The nodal bundle was then placed and an Endo catch bag. The same procedure was performed on the left.   At this point frozen section returned as minimal myometrial invasion of grade 1 lesion. The decision was made to stop the procedure is no further lymphadenectomy was indicated.  All specimens were delivered to the vagina.   The vaginal cuff was closed with a running 0 Vicryl on CT 1 suture. The abdomen and pelvis were copiously irrigated and noted to be hemostatic. The robotic instruments were removed under direct visualization as were the robotic trochars. The pneumoperitoneum was removed. The patient was then taken out of the Trendelenburg position. Using of 0 Vicryl on a UR 6 needle the midline port fascia was closed after being grasped with allis clamps. The subcutaneous tissues of the port in the left upper quadrant was reapproximated. The skin was closed using 4-0 Vicryl. Steri-Strips and benzoin were applied. The pneumo occluded balloon was removed from the vagina. The vagina was swabbed and noted to be hemostatic.   All instrument needle and Ray-Tec counts were correct x2. The patient tolerated the procedure well  and was taken to the recovery room in stable condition. This is Ashley Villegas dictating an operative note on patient Ashley Villegas.

## 2014-10-26 NOTE — Transfer of Care (Signed)
Immediate Anesthesia Transfer of Care Note  Patient: Ashley Villegas  Procedure(s) Performed: Procedure(s): ROBOTIC ASSISTED TOTAL HYSTERECTOMY WITH BILATERAL SALPINGO OOPHORECTOMY (Bilateral) POSS LYMPHADENECTOMY  (N/A)  Patient Location: PACU  Anesthesia Type:General  Level of Consciousness:  sedated, patient cooperative and responds to stimulation  Airway & Oxygen Therapy:Patient Spontanous Breathing and Patient connected to face mask oxgen  Post-op Assessment:  Report given to PACU RN and Post -op Vital signs reviewed and stable  Post vital signs:  Reviewed and stable  Last Vitals:  Filed Vitals:   10/26/14 1142  BP: 121/77  Pulse: 82  Temp: 36.6 C  Resp: 18    Complications: No apparent anesthesia complications

## 2014-10-26 NOTE — Progress Notes (Signed)
Pt has Dentures in place upon leaving PACU

## 2014-10-26 NOTE — Interval H&P Note (Signed)
History and Physical Interval Note:  10/26/2014 2:08 PM  Ashley Villegas  has presented today for surgery, with the diagnosis of ENDOMETRIAL CANCER   The various methods of treatment have been discussed with the patient and family. After consideration of risks, benefits and other options for treatment, the patient has consented to  Procedure(s): ROBOTIC ASSISTED TOTAL HYSTERECTOMY WITH BILATERAL SALPINGO OOPHORECTOMY (Bilateral) POSS LYMPHADENECTOMY  (N/A) as a surgical intervention .  The patient's history has been reviewed, patient examined, no change in status, stable for surgery.  I have reviewed the patient's chart and labs.  Questions were answered to the patient's satisfaction.     [place] A.

## 2014-10-26 NOTE — Anesthesia Procedure Notes (Signed)
Procedure Name: Intubation Date/Time: 10/26/2014 3:27 PM Performed by: Lajuana Carry E Pre-anesthesia Checklist: Patient identified, Emergency Drugs available, Suction available and Patient being monitored Patient Re-evaluated:Patient Re-evaluated prior to inductionOxygen Delivery Method: Circle System Utilized Preoxygenation: Pre-oxygenation with 100% oxygen Intubation Type: IV induction Ventilation: Mask ventilation without difficulty Laryngoscope Size: Miller and 2 Grade View: Grade I Tube type: Oral Tube size: 7.0 mm Number of attempts: 1 Airway Equipment and Method: Stylet Placement Confirmation: ETT inserted through vocal cords under direct vision,  positive ETCO2 and breath sounds checked- equal and bilateral Secured at: 21 cm Tube secured with: Tape Dental Injury: Teeth and Oropharynx as per pre-operative assessment

## 2014-10-27 ENCOUNTER — Encounter (HOSPITAL_COMMUNITY): Payer: Self-pay | Admitting: Gynecologic Oncology

## 2014-10-27 DIAGNOSIS — C541 Malignant neoplasm of endometrium: Secondary | ICD-10-CM | POA: Diagnosis not present

## 2014-10-27 LAB — CBC
HCT: 39.2 % (ref 36.0–46.0)
Hemoglobin: 12.9 g/dL (ref 12.0–15.0)
MCH: 30.2 pg (ref 26.0–34.0)
MCHC: 32.9 g/dL (ref 30.0–36.0)
MCV: 91.8 fL (ref 78.0–100.0)
Platelets: 228 10*3/uL (ref 150–400)
RBC: 4.27 MIL/uL (ref 3.87–5.11)
RDW: 14.3 % (ref 11.5–15.5)
WBC: 8.8 10*3/uL (ref 4.0–10.5)

## 2014-10-27 LAB — BASIC METABOLIC PANEL
ANION GAP: 7 (ref 5–15)
BUN: 10 mg/dL (ref 6–20)
CALCIUM: 8.8 mg/dL — AB (ref 8.9–10.3)
CO2: 25 mmol/L (ref 22–32)
Chloride: 107 mmol/L (ref 101–111)
Creatinine, Ser: 0.93 mg/dL (ref 0.44–1.00)
GFR calc Af Amer: >60 mL/min (ref >60)
Glucose, Bld: 174 mg/dL — ABNORMAL HIGH (ref 65–99)
Potassium: 4.5 mmol/L (ref 3.5–5.1)
Sodium: 139 mmol/L (ref 135–145)

## 2014-10-27 MED ORDER — OXYCODONE-ACETAMINOPHEN 5-325 MG PO TABS: 1.0000 | ORAL_TABLET | ORAL | Status: DC | PRN

## 2014-10-27 NOTE — Discharge Instructions (Signed)
10/27/2014  Return to work: 4-6 weeks if applicable  Activity: 1. Be up and out of the bed during the day.  Take a nap if needed.  You may walk up steps but be careful and use the hand rail.  Stair climbing will tire you more than you think, you may need to stop part way and rest.   2. No lifting or straining for 6 weeks.  3. No driving for 1 week(s).  Do not drive if you are taking narcotic pain medicine.  4. Shower daily.  Use soap and water on your incision and pat dry; don't rub.  No tub baths until cleared by your surgeon.   5. No sexual activity and nothing in the vagina for 8 weeks.  Diet: 1. Low sodium Heart Healthy Diet is recommended.  2. It is safe to use a laxative, such as Miralax or Colace, if you have difficulty moving your bowels.   Wound Care: 1. Keep clean and dry.  Shower daily.  Reasons to call the Doctor:  Fever - Oral temperature greater than 100.4 degrees Fahrenheit  Foul-smelling vaginal discharge  Difficulty urinating  Nausea and vomiting  Increased pain at the site of the incision that is unrelieved with pain medicine.  Difficulty breathing with or without chest pain  New calf pain especially if only on one side  Sudden, continuing increased vaginal bleeding with or without clots.   Contacts: For questions or concerns you should contact:  Dr. Alycia Rossetti at [place]  Dr. Everitt Amber at [phone]  Joylene John, NP at [phone]  After Hours: call [phone] and ask for the GYN Oncologist on call  Abdominal Hysterectomy, Care After These instructions give you information on caring for yourself after your procedure. Your doctor may also give you more specific instructions. Call your doctor if you have any problems or questions after your procedure.  HOME CARE It takes 4-6 weeks to recover from this surgery. Follow all of your doctor's instructions.   Only take medicines as told by your doctor.  Change your bandage as told  by your doctor.  Return to your doctor to have your stitches taken out.  Take showers for 2-3 weeks. Ask your doctor when it is okay to shower.  Do not douche, use tampons, or have sex (intercourse) for at least 6 weeks or as told.  Follow your doctor's advice about exercise, lifting objects, driving, and general activities.  Get plenty of rest and sleep.  Do not lift anything heavier than a gallon of milk (about 10 pounds [4.5 kilograms]) for the first month after surgery.  Get back to your normal diet as told by your doctor.  Do not drink alcohol until your doctor says it is okay.  Take a medicine to help you poop (laxative) as told by your doctor.  Eating foods high in fiber may help you poop. Eat a lot of raw fruits and vegetables, whole grains, and beans.  Drink enough fluids to keep your pee (urine) clear or pale yellow.  Have someone help you at home for 1-2 weeks after your surgery.  Keep follow-up doctor visits as told. GET HELP IF:  You have chills or fever.  You have puffiness, redness, or pain in area of the cut (incision).  You have yellowish-white fluid (pus) coming from the cut.  You have a bad smell coming from the cut or bandage.  Your cut pulls apart.  You feel dizzy or light-headed.  You have pain or bleeding  when you pee.  You keep having watery poop (diarrhea).  You keep feeling sick to your stomach (nauseous) or keep throwing up (vomiting).  You have fluid (discharge) coming from your vagina.  You have a rash.  You have a reaction to your medicine.  You need stronger pain medicine. GET HELP RIGHT AWAY IF:   You have a fever and your symptoms suddenly get worse.  You have bad belly (abdominal) pain.  You have chest pain.  You are short of breath.  You pass out (faint).  You have pain, puffiness, or redness of your leg.  You bleed a lot from your vagina and notice clumps of tissue (clots). MAKE SURE YOU:   Understand these  instructions.  Will watch your condition.  Will get help right away if you are not doing well or get worse. Document Released: 12/20/2007 Document Revised: 03/17/2013 Document Reviewed: 01/02/2013 [facility] Patient Information 2015 [place], [place]. This information is not intended to replace advice given to you by your health care provider. Make sure you discuss any questions you have with your health care provider.  Acetaminophen; Oxycodone tablets What is this medicine? ACETAMINOPHEN; OXYCODONE (a set a MEE noe fen; ox i KOE done) is a pain reliever. It is used to treat mild to moderate pain. This medicine may be used for other purposes; ask your health care provider or pharmacist if you have questions. COMMON BRAND NAME(S): Endocet, Magnacet, Narvox, Percocet, Perloxx, Primalev, Primlev, Roxicet, Xolox What should I tell my health care provider before I take this medicine? They need to know if you have any of these conditions: -brain tumor -Crohn's disease, inflammatory bowel disease, or ulcerative colitis -drug abuse or addiction -head injury -heart or circulation problems -if you often drink alcohol -kidney disease or problems going to the bathroom -liver disease -lung disease, asthma, or breathing problems -an unusual or allergic reaction to acetaminophen, oxycodone, other opioid analgesics, other medicines, foods, dyes, or preservatives -pregnant or trying to get pregnant -breast-feeding How should I use this medicine? Take this medicine by mouth with a full glass of water. Follow the directions on the prescription label. Take your medicine at regular intervals. Do not take your medicine more often than directed. Talk to your pediatrician regarding the use of this medicine in children. Special care may be needed. Patients over 71 years old may have a stronger reaction and need a smaller dose. Overdosage: If you think you have taken too much of this medicine contact a poison  control center or emergency room at once. NOTE: This medicine is only for you. Do not share this medicine with others. What if I miss a dose? If you miss a dose, take it as soon as you can. If it is almost time for your next dose, take only that dose. Do not take double or extra doses. What may interact with this medicine? -alcohol -antihistamines -barbiturates like amobarbital, butalbital, butabarbital, methohexital, pentobarbital, phenobarbital, thiopental, and secobarbital -benztropine -drugs for bladder problems like solifenacin, trospium, oxybutynin, tolterodine, hyoscyamine, and methscopolamine -drugs for breathing problems like ipratropium and tiotropium -drugs for certain stomach or intestine problems like propantheline, homatropine methylbromide, glycopyrrolate, atropine, belladonna, and dicyclomine -general anesthetics like etomidate, ketamine, nitrous oxide, propofol, desflurane, enflurane, halothane, isoflurane, and sevoflurane -medicines for depression, anxiety, or psychotic disturbances -medicines for sleep -muscle relaxants -naltrexone -narcotic medicines (opiates) for pain -phenothiazines like perphenazine, thioridazine, chlorpromazine, mesoridazine, fluphenazine, prochlorperazine, promazine, and trifluoperazine -scopolamine -tramadol -trihexyphenidyl This list may not describe all possible interactions. Give your  health care provider a list of all the medicines, herbs, non-prescription drugs, or dietary supplements you use. Also tell them if you smoke, drink alcohol, or use illegal drugs. Some items may interact with your medicine. What should I watch for while using this medicine? Tell your doctor or health care professional if your pain does not go away, if it gets worse, or if you have new or a different type of pain. You may develop tolerance to the medicine. Tolerance means that you will need a higher dose of the medication for pain relief. Tolerance is normal and is  expected if you take this medicine for a long time. Do not suddenly stop taking your medicine because you may develop a severe reaction. Your body becomes used to the medicine. This does NOT mean you are addicted. Addiction is a behavior related to getting and using a drug for a non-medical reason. If you have pain, you have a medical reason to take pain medicine. Your doctor will tell you how much medicine to take. If your doctor wants you to stop the medicine, the dose will be slowly lowered over time to avoid any side effects. You may get drowsy or dizzy. Do not drive, use machinery, or do anything that needs mental alertness until you know how this medicine affects you. Do not stand or sit up quickly, especially if you are an older patient. This reduces the risk of dizzy or fainting spells. Alcohol may interfere with the effect of this medicine. Avoid alcoholic drinks. There are different types of narcotic medicines (opiates) for pain. If you take more than one type at the same time, you may have more side effects. Give your health care provider a list of all medicines you use. Your doctor will tell you how much medicine to take. Do not take more medicine than directed. Call emergency for help if you have problems breathing. The medicine will cause constipation. Try to have a bowel movement at least every 2 to 3 days. If you do not have a bowel movement for 3 days, call your doctor or health care professional. Do not take Tylenol (acetaminophen) or medicines that have acetaminophen with this medicine. Too much acetaminophen can be very dangerous. Many nonprescription medicines contain acetaminophen. Always read the labels carefully to avoid taking more acetaminophen. What side effects may I notice from receiving this medicine? Side effects that you should report to your doctor or health care professional as soon as possible: -allergic reactions like skin rash, itching or hives, swelling of the face,  lips, or tongue -breathing difficulties, wheezing -confusion -light headedness or fainting spells -severe stomach pain -unusually weak or tired -yellowing of the skin or the whites of the eyes Side effects that usually do not require medical attention (report to your doctor or health care professional if they continue or are bothersome): -dizziness -drowsiness -nausea -vomiting This list may not describe all possible side effects. Call your doctor for medical advice about side effects. You may report side effects to FDA at 1-800-FDA-1088. Where should I keep my medicine? Keep out of the reach of children. This medicine can be abused. Keep your medicine in a safe place to protect it from theft. Do not share this medicine with anyone. Selling or giving away this medicine is dangerous and against the law. Store at room temperature between 20 and 25 degrees C (68 and 77 degrees F). Keep container tightly closed. Protect from light. This medicine may cause accidental overdose and death if  it is taken by other adults, children, or pets. Flush any unused medicine down the toilet to reduce the chance of harm. Do not use the medicine after the expiration date. NOTE: This sheet is a summary. It may not cover all possible information. If you have questions about this medicine, talk to your doctor, pharmacist, or health care provider.  2015, [employer]. (2012-11-03 13:17:35)

## 2014-10-27 NOTE — Discharge Summary (Signed)
Physician Discharge Summary  Patient ID: Ashley Villegas MRN: [id number] DOB/AGE: 1951-03-26 64 y.o.  Admit date: 10/26/2014 Discharge date: 10/27/2014  Admission Diagnoses: Endometrial cancer  Discharge Diagnoses:  Principal Problem:   Endometrial cancer   Discharged Condition:  The patient is in good condition and stable for discharge.    Hospital Course: On 10/26/2014, the patient underwent the following: Procedure(s): ROBOTIC ASSISTED TOTAL HYSTERECTOMY WITH BILATERAL SALPINGO OOPHORECTOMY POSS LYMPHADENECTOMY.  The postoperative course was uneventful.  She was discharged to home on postoperative day 1 tolerating a regular diet, voiding without difficulty, with minimal pain.  Consults: None  Significant Diagnostic Studies: None  Treatments: surgery: see above  Discharge Exam: Blood pressure 106/48, pulse 66, temperature 98.5 F (36.9 C), temperature source Oral, resp. rate 16, height 5' 0.25" (1.53 m), weight 166 lb (75.297 kg), SpO2 99 %. General appearance: alert, cooperative and no distress Resp: clear to auscultation bilaterally Cardio: regular rate and rhythm, S1, S2 normal, no murmur, click, rub or gallop GI: soft, non-tender; bowel sounds normal; no masses,  no organomegaly Extremities: extremities normal, atraumatic, no cyanosis or edema Incision/Wound: Lap sites with steri strips, no drainage or erythema noted  Disposition: 01-Home or Self Care  Discharge Instructions    Call MD for:  difficulty breathing, headache or visual disturbances    Complete by:  As directed      Call MD for:  extreme fatigue    Complete by:  As directed      Call MD for:  hives    Complete by:  As directed      Call MD for:  persistant dizziness or light-headedness    Complete by:  As directed      Call MD for:  persistant nausea and vomiting    Complete by:  As directed      Call MD for:  redness, tenderness, or signs of infection (pain, swelling, redness, odor or green/yellow  discharge around incision site)    Complete by:  As directed      Call MD for:  severe uncontrolled pain    Complete by:  As directed      Call MD for:  temperature >100.4    Complete by:  As directed      Diet - low sodium heart healthy    Complete by:  As directed      Driving Restrictions    Complete by:  As directed   No driving for 1 week.  Do not take narcotics and drive.     Increase activity slowly    Complete by:  As directed      Lifting restrictions    Complete by:  As directed   No lifting greater than 10 lbs.     Sexual Activity Restrictions    Complete by:  As directed   No sexual activity, nothing in the vagina, for 8 weeks.            Medication List    TAKE these medications        cetirizine 10 MG tablet  Commonly known as:  ZYRTEC  Take 10 mg by mouth daily.     FLUoxetine 40 MG capsule  Commonly known as:  PROZAC  Take 40 mg by mouth daily.     furosemide 40 MG tablet  Commonly known as:  LASIX  Take 40 mg by mouth daily as needed for fluid.     levothyroxine 100 MCG tablet  Commonly known as:  SYNTHROID,  LEVOTHROID  Take 100 mcg by mouth daily before breakfast. Patient takes mon - fri     levothyroxine 88 MCG tablet  Commonly known as:  SYNTHROID, LEVOTHROID  Take 88 mcg by mouth daily before breakfast. Patient takes sat and sun     oxyCODONE-acetaminophen 5-325 MG per tablet  Commonly known as:  PERCOCET/ROXICET  Take 1-2 tablets by mouth every 4 (four) hours as needed (moderate to severe pain (when tolerating fluids)).           Follow-up Information    Follow up with [facility] A., MD On 11/24/2014.   Specialty:  Gynecologic Oncology   Why:  at 10:45am at the [facility] information:   [address] [place] [id number] [phone]       Greater than thirty minutes were spend for face to face discharge instructions and discharge orders/summary in EPIC.   Signed: CROSS, MELISSA DEAL 10/27/2014, 9:26  AM

## 2014-10-29 ENCOUNTER — Telehealth: Payer: Self-pay | Admitting: Gynecologic Oncology

## 2014-10-29 NOTE — Telephone Encounter (Signed)
Spoke with patient.  Doing well post-operatively.  Informed of final path results and Dr. Elenora Gamma recommendations for no further treatment needed.  No concerns voiced.  Advised to call for any questions or concerns.

## 2014-11-03 ENCOUNTER — Telehealth: Payer: Self-pay | Admitting: Nurse Practitioner

## 2014-11-03 NOTE — Telephone Encounter (Signed)
Patient calling to inquire about a blister that has changed over the last few days. Previously noted to be clear fluid-filled blister that is now red and filled with white fluid. She denies fever or pain. Bowels moving. Per Joylene John, NP, this is likely a skin reaction to cleanser or surgical tape and should resolve without intervention. Patient informed of this and encouraged to monitor without disturbing the blister. She is told not to poke or drain the blister. She is also informed that if it concerns her, she may come in to have it examined. Patient states she wants to wait and monitor it over the weekend and come in on Monday if it gets worse. Rn informed her this would be fine, but to call Friday if blister worsens. Patient also wants to return to work 11/10/14, informed Melissa, Np; we will send written correspondence to the number she provided [phone]; claim # [id number]). Patient verbalizes understanding of the above.

## 2014-11-05 ENCOUNTER — Other Ambulatory Visit: Payer: Self-pay

## 2014-11-05 ENCOUNTER — Encounter: Payer: Self-pay | Admitting: Gynecologic Oncology

## 2014-11-05 DIAGNOSIS — Z1231 Encounter for screening mammogram for malignant neoplasm of breast: Secondary | ICD-10-CM

## 2014-11-09 ENCOUNTER — Ambulatory Visit
Admission: RE | Admit: 2014-11-09 | Discharge: 2014-11-09 | Disposition: A | Discharge status: Home / Self Care | Payer: Managed Care, Other (non HMO) | Source: Ambulatory Visit

## 2014-11-09 DIAGNOSIS — Z1231 Encounter for screening mammogram for malignant neoplasm of breast: Secondary | ICD-10-CM

## 2014-11-16 ENCOUNTER — Other Ambulatory Visit: Payer: Self-pay | Admitting: Gynecologic Oncology

## 2014-11-16 ENCOUNTER — Telehealth: Payer: Self-pay | Admitting: *Deleted

## 2014-11-16 DIAGNOSIS — R110 Nausea: Secondary | ICD-10-CM

## 2014-11-16 DIAGNOSIS — R112 Nausea with vomiting, unspecified: Secondary | ICD-10-CM

## 2014-11-16 DIAGNOSIS — R6883 Chills (without fever): Secondary | ICD-10-CM

## 2014-11-16 MED ORDER — PROMETHAZINE HCL 12.5 MG PO TABS: 12.5000 mg | ORAL_TABLET | Freq: Four times a day (QID) | ORAL | Status: DC | PRN

## 2014-11-16 NOTE — Telephone Encounter (Signed)
Called patient to notify her of appt tomorrow for CT scan with 12:30pm arrival time so she can drink water based contrast at radiology. Prescription for nausea medication sent to patient's pharmacy in case she has continued nausea tonight. Patient states she will pick-up nausea medication if needed. Inquired if patient is feeling any better - she states "I just woke up from a nap so it's hard to tell yet."  Patient agreeable to scan tomorrow. No other questions or concerns noted at this time.

## 2014-11-16 NOTE — Progress Notes (Signed)
Order for stat CT abdomen pelvis ordered to rule out post-op abscess.  Patient called with nausea, chills (feels like fever but has not checked temperature), abdominal discomfort, emesis x4 today, last BM Saturday.  Reporting feeling fine with symptoms arising today.  Information discussed with Dr. Denman George, who would like a stat CT AP to rule out abscess.  Orders placed.  Patient will be contacted with date and time.

## 2014-11-17 ENCOUNTER — Telehealth: Payer: Self-pay | Admitting: *Deleted

## 2014-11-17 ENCOUNTER — Telehealth: Payer: Self-pay | Admitting: Gynecologic Oncology

## 2014-11-17 ENCOUNTER — Encounter (HOSPITAL_COMMUNITY): Payer: Self-pay

## 2014-11-17 ENCOUNTER — Ambulatory Visit (HOSPITAL_COMMUNITY)
Admission: RE | Admit: 2014-11-17 | Discharge: 2014-11-17 | Disposition: A | Discharge status: Home / Self Care | Payer: Managed Care, Other (non HMO) | Source: Ambulatory Visit | Attending: Gynecologic Oncology | Admitting: Gynecologic Oncology

## 2014-11-17 DIAGNOSIS — K449 Diaphragmatic hernia without obstruction or gangrene: Secondary | ICD-10-CM | POA: Insufficient documentation

## 2014-11-17 DIAGNOSIS — R112 Nausea with vomiting, unspecified: Secondary | ICD-10-CM | POA: Insufficient documentation

## 2014-11-17 DIAGNOSIS — R6883 Chills (without fever): Secondary | ICD-10-CM | POA: Diagnosis present

## 2014-11-17 DIAGNOSIS — Z90710 Acquired absence of both cervix and uterus: Secondary | ICD-10-CM | POA: Diagnosis not present

## 2014-11-17 MED ORDER — IOHEXOL 300 MG/ML  SOLN
25.0000 mL | INTRAMUSCULAR | Status: AC
  Administered 2014-11-17 (×2): 25 mL via ORAL

## 2014-11-17 MED ORDER — IOHEXOL 300 MG/ML  SOLN
100.0000 mL | Freq: Once | INTRAMUSCULAR | Status: AC | PRN
  Administered 2014-11-17: 100 mL via INTRAVENOUS

## 2014-11-17 NOTE — Telephone Encounter (Signed)
Pt called states " I had a BM last night it was very painful, and I'm still having pain in my pelvic area." Pt denied fever, n/v. Pt confirmed appt for CT scan asked if she should still get scan today. Discussed with pt she should keep CT scan as she is still having pain after BM. Will discuss with NP and will call pt back with any changes or if CT should be cancelled. Reviewed with NP, pt to keep CT scan.

## 2014-11-17 NOTE — Telephone Encounter (Signed)
Called to check on patient's status this am.  Patient stating she "can't even sit up because the pain in the pelvis is so much."  She reports pain in the tops of her legs as well.  No more fever symptoms or chills.  No nausea or emesis reported.  Bowel movement this am.  Voiding without dysuria but reports pressure on her pelvic bone that stimulates her to urinate.  That has been present since surgery.  The pain in her lower pelvis is going straight across.  Advised we should still proceed with the CT per Dr. Denman George to rule out post-op abscess.  Verbalizing understanding.

## 2014-11-22 ENCOUNTER — Ambulatory Visit: Payer: Managed Care, Other (non HMO) | Attending: Gynecologic Oncology | Admitting: Gynecologic Oncology

## 2014-11-22 ENCOUNTER — Telehealth: Payer: Self-pay | Admitting: Gynecologic Oncology

## 2014-11-22 ENCOUNTER — Encounter: Payer: Self-pay | Admitting: Gynecologic Oncology

## 2014-11-22 VITALS — BP 109/55 | HR 76 | Temp 98.1°F | Resp 16 | Ht 60.5 in | Wt 165.1 lb

## 2014-11-22 DIAGNOSIS — R1030 Lower abdominal pain, unspecified: Secondary | ICD-10-CM | POA: Diagnosis not present

## 2014-11-22 DIAGNOSIS — N949 Unspecified condition associated with female genital organs and menstrual cycle: Secondary | ICD-10-CM | POA: Insufficient documentation

## 2014-11-22 DIAGNOSIS — C541 Malignant neoplasm of endometrium: Secondary | ICD-10-CM | POA: Diagnosis not present

## 2014-11-22 DIAGNOSIS — R1032 Left lower quadrant pain: Secondary | ICD-10-CM

## 2014-11-22 DIAGNOSIS — IMO0002 Reserved for concepts with insufficient information to code with codable children: Secondary | ICD-10-CM | POA: Insufficient documentation

## 2014-11-22 MED ORDER — OXYCODONE-ACETAMINOPHEN 5-325 MG PO TABS: 1.0000 | ORAL_TABLET | ORAL | Status: DC | PRN

## 2014-11-22 NOTE — Telephone Encounter (Signed)
Returned call to patient.  Message had been left on the voicemail stating "the medication is not working and I am in severe pain pretty much all the time."  Patient stating "I can't sit up, cant really walk due to the severe pain.  The pain is more on the left and if I stand too long it shoots down to my foot."  No documented fever but "wakes up soaking wet."  Advised to come in to the office today for further evaluation.  Verbalizing understanding.  Advised to call the office before her appt time or to seek emergency care if needed.

## 2014-11-22 NOTE — Progress Notes (Signed)
POSTOPERATIVE FOLLOWUP CC: left pelvic pain   ASSESSMENT AND PLAN:  64 year old woman with stage IA grade 1 endometrial cancer, 3.5 weeks postop with symptomatic left pelvic lymphocyst.  No signs of sepsis, possible occult superinfection of lymphocyst. Recommend IR drainage of left lymphocyst. Continue cipro and flagyl. Represcribed percocet.  No adjuvant therapy recommended for stage IA grade 1 endoemetrial cancer.  Return to clinic in approximately 2 weeks or sooner as dictated by symptom improvement or deterioration.  HPI:  Ashley Villegas is a 64 y.o. year old No obstetric history on file. initially seen in consultation on 09/30/14, referred by Dr Nelda Marseille for grade 1 endometrial cancer.  She then underwent a robotic hysterectomy, BSO and bilateral pelvic node dissection on 04/02/59 without complications.  Her postoperative course was uncomplicated .  Her final pathologic diagnosis is a Stage IA Grade 1 endometrioid endometrial cancer with no  lymphovascular space invasion, 5/15 mm (33%) of myometrial invasion and negative lymph nodes.  She is seen today for a postoperative check because in the past week she has begun feeling increased left lower quadrant pain. Constant, but gets worse in waves. Some improvement with change in position. Sharp, feels like it shoots down her left leg. Not associated with bowel movements. Has run out of percocet in past two days, and not relieved by motrin.   1 week ago she developed symptoms of fevers and chills and nausea and emesis, she called and a CT of the abdo/pelvis was ordered and performed on 11/17/14. It showed a small 3cm right pelvic sidewall fluid colletion and a larger 5cm one on the left. Given her symptoms and the concern for occult superinfection of these lymphocysts, she was empirically prescribed cipro and flagyl which she has been taking orally for 5 days since 8/24. Her pain symptoms are unchanged, however her nausea and emesis resolved.  Since taking abx she has had approximately 4 loose stools per day.  Current Outpatient Prescriptions on File Prior to Visit  Medication Sig Dispense Refill  . FLUoxetine (PROZAC) 40 MG capsule Take 40 mg by mouth daily.    . furosemide (LASIX) 40 MG tablet Take 40 mg by mouth daily as needed for fluid.     Marland Kitchen levothyroxine (SYNTHROID, LEVOTHROID) 100 MCG tablet Take 100 mcg by mouth daily before breakfast. Patient takes mon - fri    . levothyroxine (SYNTHROID, LEVOTHROID) 88 MCG tablet Take 88 mcg by mouth daily before breakfast. Patient takes sat and sun    . promethazine (PHENERGAN) 12.5 MG tablet Take 1 tablet (12.5 mg total) by mouth every 6 (six) hours as needed for nausea or vomiting. 10 tablet 0  . cetirizine (ZYRTEC) 10 MG tablet Take 10 mg by mouth daily.     No current facility-administered medications on file prior to visit.   Allergies  Allergen Reactions  . Hydrocortisone Rash   Past Medical History  Diagnosis Date  . Hypothyroidism     on synthroid   . Anxiety   . GERD (gastroesophageal reflux disease)   . Arthritis   . Complication of anesthesia   . PONV (postoperative nausea and vomiting)   . Depression   . Cancer     endometrial cancer   Past Surgical History  Procedure Laterality Date  . Diagnostic laparoscopy      fibroid tumors on her ovaries   . Tubal ligation    . Cesarean section    . Dilation and curettage of uterus    . Wisdom  tooth extraction    . Knee surgery    . Hysteroscopy w/d&c N/A 09/15/2014    Procedure: DILATATION AND CURETTAGE /HYSTEROSCOPY/MYOSURE RESECTION OF POLYP;  Surgeon: Janyth Pupa, DO;  Location: [facility];  Service: Gynecology;  Laterality: N/A;  . Robotic assisted total hysterectomy with bilateral salpingo oopherectomy Bilateral 10/26/2014    Procedure: ROBOTIC ASSISTED TOTAL HYSTERECTOMY WITH BILATERAL SALPINGO OOPHORECTOMY;  Surgeon: [name], MD;  Location: [facility];  Service: Gynecology;  Laterality: Bilateral;  . Lymph node  dissection N/A 10/26/2014    Procedure: POSS LYMPHADENECTOMY ;  Surgeon: [name], MD;  Location: [facility];  Service: Gynecology;  Laterality: N/A;   History reviewed. No pertinent family history. Social History   Social History  . Marital Status: Single    Spouse Name: N/A  . Number of Children: N/A  . Years of Education: N/A   Occupational History  . Not on file.   Social History Main Topics  . Smoking status: Former Smoker -- 0.50 packs/day for 13 years    Types: Cigarettes    Quit date: 10/20/2007  . Smokeless tobacco: Never Used  . Alcohol Use: No  . Drug Use: No  . Sexual Activity: Not on file   Other Topics Concern  . Not on file   Social History Narrative     Review of systems: Constitutional:  She has no weight gain or weight loss. + fever or chills. Feels weak and tired. Eyes: No blurred vision Ears, Nose, Mouth, Throat: No dizziness, headaches or changes in hearing. No mouth sores. Cardiovascular: No chest pain, palpitations or edema. Respiratory:  No shortness of breath, wheezing or cough Gastrointestinal: + loose stools. She denies any nausea or vomiting since 11/17/14. She denies blood in her stool or heart burn. Genitourinary:  + left pelvic pain, + pelvic pressure. She has no hematuria, dysuria, or incontinence. She has no irregular vaginal bleeding or vaginal discharge Musculoskeleta;: +muscle weakness, no joint pains.  Skin:  She has no skin changes, rashes or itching Neurological:  Denies dizziness or headaches. No neuropathy, no numbness or tingling. Psychiatric:  She denies depression or anxiety. Hematologic/Lymphatic:   No easy bruising or bleeding   Physical Exam: Blood pressure 109/55, pulse 76, temperature 98.1 F (36.7 C), temperature source Oral, resp. rate 16, height 5' 0.5" (1.537 m), weight 165 lb 1.6 oz (74.889 kg), SpO2 98 %. General: Well dressed, well nourished in no apparent distress.   HEENT:  Normocephalic and atraumatic, no  lesions.  Extraocular muscles intact. Sclerae anicteric. Pupils equal, round, reactive. No mouth sores or ulcers. Thyroid is normal size, not nodular, midline. Skin:  No lesions or rashes. Breasts:  deferred Lungs:  deferred Cardiovascular:  deferred Abdomen:  Soft, tender on left to deep palpation, nondistended.  No palpable masses.  No hepatosplenomegaly.  No ascites. Normal bowel sounds.  No hernias.  Incisions are clean dry and intact. No rebound or guarding or masses. Genitourinary: deferred Extremities: No cyanosis, clubbing or edema.  No calf tenderness or erythema. No palpable cords. Psychiatric: Mood and affect are appropriate. Neurological: Awake, alert and oriented x 3. Sensation is intact, no neuropathy.  Musculoskeletal: No pain, normal strength and range of motion.  Donaciano Eva, MD

## 2014-11-22 NOTE — Patient Instructions (Signed)
You will receive a phone call from [facility] to have you come in for aspiration of the fluid collection in the left pelvis.  Continue taking antibiotics as prescribed.  Please take the oxycodone as needed.  Please call for any questions or concerns.

## 2014-11-23 ENCOUNTER — Telehealth: Payer: Self-pay | Admitting: *Deleted

## 2014-11-23 ENCOUNTER — Other Ambulatory Visit: Payer: Self-pay | Admitting: *Deleted

## 2014-11-23 MED ORDER — METRONIDAZOLE 500 MG PO TABS: 500.0000 mg | ORAL_TABLET | Freq: Three times a day (TID) | ORAL | Status: DC

## 2014-11-23 MED ORDER — CIPROFLOXACIN HCL 250 MG PO TABS: 250.0000 mg | ORAL_TABLET | Freq: Two times a day (BID) | ORAL | Status: DC

## 2014-11-23 NOTE — Telephone Encounter (Signed)
Per Dr. Denman George, patient will continue on 7 more days of Cipro and Flagyl (total of 14 days). Prescriptions sent to patient's pharmacy and patient notified of this. Patient agreeable to pickup prescription and continue after current prescription runs out tomorrow. No other questions or concerns noted at this time.

## 2014-11-23 NOTE — Telephone Encounter (Signed)
Received call from Dr. Pascal Lux in IR regarding CT aspiration order placed by Dr. Denman George. Per Dr. Pascal Lux - patient will need diagnostic imaging first followed by possible procedure based on CT findings. Called patient and let her know she will be receiving a phone call from IR with this appointment and that it will take place at either [facility] or [facility].

## 2014-11-24 ENCOUNTER — Ambulatory Visit: Payer: Managed Care, Other (non HMO) | Admitting: Gynecologic Oncology

## 2014-11-24 ENCOUNTER — Other Ambulatory Visit: Payer: Self-pay | Admitting: Physician Assistant

## 2014-11-25 ENCOUNTER — Ambulatory Visit (HOSPITAL_COMMUNITY)
Admission: RE | Admit: 2014-11-25 | Discharge: 2014-11-25 | Disposition: A | Discharge status: Home / Self Care | Payer: Managed Care, Other (non HMO) | Source: Ambulatory Visit | Attending: Interventional Radiology | Admitting: Interventional Radiology

## 2014-11-25 ENCOUNTER — Other Ambulatory Visit (HOSPITAL_COMMUNITY): Payer: Managed Care, Other (non HMO)

## 2014-11-25 ENCOUNTER — Other Ambulatory Visit: Payer: Self-pay | Admitting: Gynecologic Oncology

## 2014-11-25 ENCOUNTER — Ambulatory Visit (HOSPITAL_COMMUNITY)
Admission: RE | Admit: 2014-11-25 | Discharge: 2014-11-25 | Disposition: A | Discharge status: Home / Self Care | Payer: Managed Care, Other (non HMO) | Source: Ambulatory Visit | Attending: Gynecologic Oncology | Admitting: Gynecologic Oncology

## 2014-11-25 ENCOUNTER — Other Ambulatory Visit (HOSPITAL_COMMUNITY): Payer: Self-pay | Admitting: Interventional Radiology

## 2014-11-25 ENCOUNTER — Encounter (HOSPITAL_COMMUNITY): Payer: Self-pay

## 2014-11-25 DIAGNOSIS — R102 Pelvic and perineal pain: Secondary | ICD-10-CM | POA: Diagnosis present

## 2014-11-25 DIAGNOSIS — R1032 Left lower quadrant pain: Secondary | ICD-10-CM | POA: Insufficient documentation

## 2014-11-25 DIAGNOSIS — R110 Nausea: Secondary | ICD-10-CM | POA: Diagnosis not present

## 2014-11-25 DIAGNOSIS — C541 Malignant neoplasm of endometrium: Secondary | ICD-10-CM

## 2014-11-25 DIAGNOSIS — Z9889 Other specified postprocedural states: Secondary | ICD-10-CM

## 2014-11-25 DIAGNOSIS — R1030 Lower abdominal pain, unspecified: Secondary | ICD-10-CM | POA: Diagnosis not present

## 2014-11-25 DIAGNOSIS — Z87891 Personal history of nicotine dependence: Secondary | ICD-10-CM | POA: Diagnosis not present

## 2014-11-25 DIAGNOSIS — R509 Fever, unspecified: Secondary | ICD-10-CM | POA: Insufficient documentation

## 2014-11-25 DIAGNOSIS — K219 Gastro-esophageal reflux disease without esophagitis: Secondary | ICD-10-CM | POA: Insufficient documentation

## 2014-11-25 DIAGNOSIS — J9811 Atelectasis: Secondary | ICD-10-CM | POA: Diagnosis not present

## 2014-11-25 DIAGNOSIS — B951 Streptococcus, group B, as the cause of diseases classified elsewhere: Secondary | ICD-10-CM | POA: Insufficient documentation

## 2014-11-25 DIAGNOSIS — K449 Diaphragmatic hernia without obstruction or gangrene: Secondary | ICD-10-CM | POA: Insufficient documentation

## 2014-11-25 DIAGNOSIS — L0291 Cutaneous abscess, unspecified: Secondary | ICD-10-CM

## 2014-11-25 DIAGNOSIS — M1990 Unspecified osteoarthritis, unspecified site: Secondary | ICD-10-CM | POA: Insufficient documentation

## 2014-11-25 DIAGNOSIS — I898 Other specified noninfective disorders of lymphatic vessels and lymph nodes: Secondary | ICD-10-CM | POA: Insufficient documentation

## 2014-11-25 DIAGNOSIS — E039 Hypothyroidism, unspecified: Secondary | ICD-10-CM | POA: Insufficient documentation

## 2014-11-25 DIAGNOSIS — R188 Other ascites: Secondary | ICD-10-CM

## 2014-11-25 DIAGNOSIS — L02211 Cutaneous abscess of abdominal wall: Secondary | ICD-10-CM | POA: Diagnosis not present

## 2014-11-25 DIAGNOSIS — F329 Major depressive disorder, single episode, unspecified: Secondary | ICD-10-CM | POA: Insufficient documentation

## 2014-11-25 DIAGNOSIS — F419 Anxiety disorder, unspecified: Secondary | ICD-10-CM | POA: Diagnosis not present

## 2014-11-25 DIAGNOSIS — K5730 Diverticulosis of large intestine without perforation or abscess without bleeding: Secondary | ICD-10-CM | POA: Diagnosis not present

## 2014-11-25 DIAGNOSIS — Z90710 Acquired absence of both cervix and uterus: Secondary | ICD-10-CM | POA: Diagnosis not present

## 2014-11-25 DIAGNOSIS — IMO0002 Reserved for concepts with insufficient information to code with codable children: Secondary | ICD-10-CM

## 2014-11-25 LAB — CBC
HEMATOCRIT: 38.4 % (ref 36.0–46.0)
Hemoglobin: 13 g/dL (ref 12.0–15.0)
MCH: 31.1 pg (ref 26.0–34.0)
MCHC: 33.9 g/dL (ref 30.0–36.0)
MCV: 91.9 fL (ref 78.0–100.0)
PLATELETS: 445 10*3/uL — AB (ref 150–400)
RBC: 4.18 MIL/uL (ref 3.87–5.11)
RDW: 14 % (ref 11.5–15.5)
WBC: 11.7 10*3/uL — ABNORMAL HIGH (ref 4.0–10.5)

## 2014-11-25 LAB — APTT: aPTT: 33 seconds (ref 24–37)

## 2014-11-25 LAB — PROTIME-INR
INR: 1.22 (ref 0.00–1.49)
Prothrombin Time: 15.6 seconds — ABNORMAL HIGH (ref 11.6–15.2)

## 2014-11-25 MED ORDER — OXYCODONE-ACETAMINOPHEN 5-325 MG PO TABS
1.0000 | ORAL_TABLET | ORAL | Status: DC | PRN
Start: 2014-11-25 — End: 2014-11-26
  Filled 2014-11-25: qty 2

## 2014-11-25 MED ORDER — FENTANYL CITRATE (PF) 100 MCG/2ML IJ SOLN
INTRAMUSCULAR | Status: AC
  Filled 2014-11-25: qty 6

## 2014-11-25 MED ORDER — SODIUM CHLORIDE 0.9 % IV SOLN
INTRAVENOUS | Status: DC
  Administered 2014-11-25: 13:00:00 via INTRAVENOUS

## 2014-11-25 MED ORDER — MIDAZOLAM HCL 2 MG/2ML IJ SOLN
INTRAMUSCULAR | Status: AC
  Filled 2014-11-25: qty 6

## 2014-11-25 MED ORDER — FENTANYL CITRATE (PF) 100 MCG/2ML IJ SOLN
INTRAMUSCULAR | Status: AC | PRN
  Administered 2014-11-25 (×2): 50 ug via INTRAVENOUS
  Administered 2014-11-25: 25 ug via INTRAVENOUS

## 2014-11-25 MED ORDER — MIDAZOLAM HCL 2 MG/2ML IJ SOLN
INTRAMUSCULAR | Status: AC | PRN
  Administered 2014-11-25: 0.5 mg via INTRAVENOUS
  Administered 2014-11-25: 1 mg via INTRAVENOUS

## 2014-11-25 MED ORDER — HYDROCODONE-ACETAMINOPHEN 5-325 MG PO TABS: 1.0000 | ORAL_TABLET | ORAL | Status: DC | PRN

## 2014-11-25 NOTE — Discharge Instructions (Signed)
Conscious Sedation, Adult, Care After Refer to this sheet in the next few weeks. These instructions provide you with information on caring for yourself after your procedure. Your health care provider may also give you more specific instructions. Your treatment has been planned according to current medical practices, but problems sometimes occur. Call your health care provider if you have any problems or questions after your procedure. WHAT TO EXPECT AFTER THE PROCEDURE  After your procedure:  You may feel sleepy, clumsy, and have poor balance for several hours.  Vomiting may occur if you eat too soon after the procedure. HOME CARE INSTRUCTIONS  Do not participate in any activities where you could become injured for at least 24 hours. Do not:  Drive.  Swim.  Ride a bicycle.  Operate heavy machinery.  Cook.  Use power tools.  Climb ladders.  Work from a high place.  Do not make important decisions or sign legal documents until you are improved.  If you vomit, drink water, juice, or soup when you can drink without vomiting. Make sure you have little or no nausea before eating solid foods.  Only take over-the-counter or prescription medicines for pain, discomfort, or fever as directed by your health care provider.  Make sure you and your family fully understand everything about the medicines given to you, including what side effects may occur.  You should not drink alcohol, take sleeping pills, or take medicines that cause drowsiness for at least 24 hours.  If you smoke, do not smoke without supervision.  If you are feeling better, you may resume normal activities 24 hours after you were sedated.  Keep all appointments with your health care provider. SEEK MEDICAL CARE IF:  Your skin is pale or bluish in color.  You continue to feel nauseous or vomit.  Your pain is getting worse and is not helped by medicine.  You have bleeding or swelling.  You are still sleepy or  feeling clumsy after 24 hours. SEEK IMMEDIATE MEDICAL CARE IF:  You develop a rash.  You have difficulty breathing.  You develop any type of allergic problem.  You have a fever. MAKE SURE YOU:  Understand these instructions.  Will watch your condition.  Will get help right away if you are not doing well or get worse. Document Released: 12/31/2012 Document Reviewed: 12/31/2012 [facility] Patient Information 2015 [place], [place]. This information is not intended to replace advice given to you by your health care provider. Make sure you discuss any questions you have with your health care provider.  Ascites Drainage Catheter Placement Care After Refer to this sheet in the next few weeks. These instructions provide you with information on caring for yourself after your procedure. Your caregiver may also give you more specific instructions. Your treatment has been planned according to current medical practices, but problems sometimes occur. Call your caregiver if you have any problems or questions after your procedure. HOME CARE INSTRUCTIONS  Rest for a few days while your skin is healing.  Only take over-the-counter or prescription medicines for pain, fever, or discomfort as directed by your caregiver.  Apply an antiseptic ointment and bandages (dressings) to the wound as directed by your caregiver.  Follow your caregiver's dietary instructions. Your caregiver will help you to understand the foods, liquids, and amount of sodium that can increase or decrease fluid retention. You may be asked to eat a high protein diet following the procedure. You may be asked to avoid alcohol and restrict sodium intake.  Return  to your caregiver for blood tests and other tests as directed.  See your caregiver for removal of stitches in 4 to 5 days, or as directed.  You may be asked to drain fluid at home. Follow your caregiver's instructions. Draining the fluid will help you to avoid pain and other  symptoms.  Check your weight every day. SEEK MEDICAL CARE IF:  You have trouble caring for your catheter.  You develop chills.  You notice bleeding, skin irritation, drainage, redness, or pain around the insertion site.  You have abdominal pain, bloating, pressure, or cramping.  You notice that fluid in the catheter is cloudy.  You have other new symptoms.  You have questions or concerns. SEEK IMMEDIATE MEDICAL CARE IF:  Your abdominal pain does not go away or becomes severe.  You have a fever. MAKE SURE YOU:  Understand these instructions.  Will watch your condition.  Will get help right away if you are not doing well or get worse. Document Released: 03/01/2011 Document Revised: 06/04/2011 Document Reviewed: 03/01/2011 [facility] Patient Information 2015 [place], [place]. This information is not intended to replace advice given to you by your health care provider. Make sure you discuss any questions you have with your health care provider.  Follow up with Home health care provider concerning: abdominal drain care and potential rule out c-diff stool infection.  DIARRHEA: Practice excellent hand hygiene washing with soap and water and use bleach wipes to clean surfaces. Use bedpan provided to collect stool specimen and transfer to blue lid cup, take specimen to Dr Serita Grit office for evaluation.  DRAIN: Flush abdominal drain twice per day as demonstrated with sterile saline. Empty drain twice per day and as needed, document drainage amount as well as flush amount. Take report to follow up appointments.  Schedule follow-up appointments with: 1.drain clinic (radiology, per Dr Vernard Gambles, [phone]) and 2.with Dr Denman George [phone]).  May cover drain and shower 24 to 48 hours post procedure. Do not submerge in tub bath. Keep drain clean and dry. Do not keep wet dressing on drain. Replace dressing as needed. Call Dr. Serita Grit office / Joylene John, NP contact, with any concerns/  questions.

## 2014-11-25 NOTE — Progress Notes (Signed)
Provided post procedure drain care instruction (after consulting Dr Vernard Gambles). Pt drowsy from sedation and unable to see drain well (due to ascites), also son unable to tolerate site and care of drain.  Followed up with Joylene John, NP in Dr Serita Grit office to make her aware of need for home health followup, instructed Pt accordingly. Pt reports diarrhea x 2 weeks, but no stools during this visit to test. Gave Pt supplies to collect stool at home and take to Dr Serita Grit office.  Pt's son verbalizes understanding of written discharge instructions.

## 2014-11-25 NOTE — Procedures (Signed)
CT guided 54f drain placement into L pelvic lymphocele 28ml floculent fluid sent for GS, C&S No complication No blood loss. See complete dictation in [facility].

## 2014-11-25 NOTE — H&P (Signed)
Chief Complaint: Patient was seen in consultation today for CT guided pelvic fluid collection aspiration/drainage   Referring Physician(s): Rossi,Emma  History of Present Illness: Ashley Villegas is a 64 y.o. female with history of recently diagnosed stage IA grade 1 endometrial cancer, s/p robotic hysterectomy, BSO and bilateral pelvic lymphadenectomy on 10/26/14. She  recently began to have some lower pelvic pain with intermittent nausea , loose bowel movements, intermittent nausea with occasional fevers/chills. CT of the abdomen and pelvis on 8/24 revealed bilateral fluid densities along the pelvic sidewalls (L>R) favoring seromas versus lymphangiomas. The patient has empirically been on Cipro and Flagyl in the interim. She presents today for follow-up CT scan and possible aspiration/drainage of pelvic fluid collection.  Past Medical History  Diagnosis Date  . Hypothyroidism     on synthroid   . Anxiety   . GERD (gastroesophageal reflux disease)   . Arthritis   . Complication of anesthesia   . PONV (postoperative nausea and vomiting)   . Depression   . Cancer     endometrial cancer    Past Surgical History  Procedure Laterality Date  . Diagnostic laparoscopy      fibroid tumors on her ovaries   . Tubal ligation    . Cesarean section    . Dilation and curettage of uterus    . Wisdom tooth extraction    . Knee surgery    . Hysteroscopy w/d&c N/A 09/15/2014    Procedure: DILATATION AND CURETTAGE /HYSTEROSCOPY/MYOSURE RESECTION OF POLYP;  Surgeon: Janyth Pupa, DO;  Location: [facility];  Service: Gynecology;  Laterality: N/A;  . Robotic assisted total hysterectomy with bilateral salpingo oopherectomy Bilateral 10/26/2014    Procedure: ROBOTIC ASSISTED TOTAL HYSTERECTOMY WITH BILATERAL SALPINGO OOPHORECTOMY;  Surgeon: [name], MD;  Location: [facility];  Service: Gynecology;  Laterality: Bilateral;  . Lymph node dissection N/A 10/26/2014    Procedure: POSS LYMPHADENECTOMY ;   Surgeon: [name], MD;  Location: [facility];  Service: Gynecology;  Laterality: N/A;    Allergies: Hydrocodone  Medications: Prior to Admission medications   Medication Sig Start Date End Date Taking? Authorizing Provider  BISACODYL 5 MG EC tablet TK AS DIRECTED 10/16/14   Historical Provider, MD  cetirizine (ZYRTEC) 10 MG tablet Take 10 mg by mouth daily.    Historical Provider, MD  ciprofloxacin (CIPRO) 250 MG tablet Take 1 tablet (250 mg total) by mouth 2 (two) times daily. 11/23/14   Everitt Amber, MD  FLUoxetine (PROZAC) 40 MG capsule Take 40 mg by mouth daily.    Historical Provider, MD  furosemide (LASIX) 40 MG tablet Take 40 mg by mouth daily as needed for fluid.     Historical Provider, MD  levothyroxine (SYNTHROID, LEVOTHROID) 100 MCG tablet Take 100 mcg by mouth daily before breakfast. Patient takes mon - fri    Historical Provider, MD  levothyroxine (SYNTHROID, LEVOTHROID) 88 MCG tablet Take 88 mcg by mouth daily before breakfast. Patient takes sat and sun    Historical Provider, MD  metroNIDAZOLE (FLAGYL) 500 MG tablet Take 1 tablet (500 mg total) by mouth 3 (three) times daily. 11/23/14   Everitt Amber, MD  oxyCODONE-acetaminophen (PERCOCET/ROXICET) 5-325 MG per tablet Take 1-2 tablets by mouth every 4 (four) hours as needed (moderate to severe pain (when tolerating fluids)). 11/22/14   Everitt Amber, MD  promethazine (PHENERGAN) 12.5 MG tablet Take 1 tablet (12.5 mg total) by mouth every 6 (six) hours as needed for nausea or vomiting. 11/16/14   Terrence Dupont  Denman George, MD     History reviewed. No pertinent family history.  Social History   Social History  . Marital Status: Single    Spouse Name: N/A  . Number of Children: N/A  . Years of Education: N/A   Social History Main Topics  . Smoking status: Former Smoker -- 0.50 packs/day for 13 years    Types: Cigarettes    Quit date: 10/20/2007  . Smokeless tobacco: Never Used  . Alcohol Use: No  . Drug Use: No  . Sexual Activity: Not Asked    Other Topics Concern  . None   Social History Narrative      Review of Systems   Constitutional:       Recent fevers/chills  Respiratory: Positive for cough and shortness of breath.   Cardiovascular: Negative for chest pain.  Gastrointestinal: Positive for nausea, abdominal pain and diarrhea. Negative for vomiting and blood in stool.  Genitourinary: Negative for dysuria and hematuria.  Musculoskeletal: Positive for back pain.  Neurological: Negative for headaches.    Vital Signs: BP 121/59 mmHg  Pulse 77  Temp(Src) 98.9 F (37.2 C) (Oral)  Resp 18  SpO2 96%  Physical Exam  Constitutional: She is oriented to person, place, and time. She appears well-developed and well-nourished.  Cardiovascular: Normal rate and regular rhythm.   Pulmonary/Chest: Effort normal.  Sl dim BS bases  Abdominal: Soft. Bowel sounds are normal. There is tenderness.  Musculoskeletal: Normal range of motion.  Neurological: She is alert and oriented to person, place, and time.    Mallampati Score:     Imaging: Ct Abdomen Pelvis W Contrast  11/17/2014   CLINICAL DATA:  Hysterectomy 10/26/2014 for endometrial carcinoma. Started having nausea and fever with chills yesterday. Lower abdominal pain. Rule out pelvic abscess.  EXAM: CT ABDOMEN AND PELVIS WITH CONTRAST  TECHNIQUE: Multidetector CT imaging of the abdomen and pelvis was performed using the standard protocol following bolus administration of intravenous contrast.  CONTRAST:  146mL OMNIPAQUE IOHEXOL 300 MG/ML  SOLN  COMPARISON:  None.  FINDINGS: Lower chest: Subsegmental atelectasis at both lung bases. Mild cardiomegaly, without pericardial or pleural effusion. Small hiatal hernia.  Hepatobiliary: Suspect mild hepatic steatosis. A right hepatic lobe (segment 6) 11 mm low-density lesion image 23 of series 2.  Focal steatosis adjacent the falciform ligament. Normal gallbladder, without biliary ductal dilatation.  Pancreas: Normal, without mass or  ductal dilatation.  Spleen: Normal  Adrenals/Urinary Tract: Normal adrenal glands. Normal kidneys, without hydronephrosis. The bladder is decompressed on portal venous phase images. Mild thickening and wall irregularity could be at least partially secondary. Example image 73 of series 2.  Stomach/Bowel: Normal remainder of the stomach. Scattered colonic diverticula. Normal small bowel.  Vascular/Lymphatic: Mild aortic atherosclerosis. No retroperitoneal or retrocrural adenopathy. Right pelvic sidewall fluid density foci. Example at 2.9 x 1.7 cm on image 67 of series 2.  Fluid density within the left external iliac station of the pelvic sidewall measures 5.1 x 3.3 cm on image 68 of series 2. No soft tissue density components or gas within. Extension or adjacent fluid is identified more superiorly on image 62.  Reproductive: Hysterectomy.  No adnexal mass.  Other: Edema is identified throughout the pelvis, greater on the left.  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 Trace L4-5 retrolisthesis which is likely degenerative. Mild disc bulges, including at L4-5 and L5-S1.  IMPRESSION: 1. Status post hysterectomy. Pelvic edema which is greater than typically seen 3 weeks postop. Suspicious for postoperative infection. 2.  Bladder wall thickening and irregularity. Although this could be partially due to underdistention, Concurrent cystitis cannot be excluded. 3. Bilateral fluid density lesions along the pelvic sidewalls. Favored to represent seromas or lymphangiomas. No specific features to suggest abscess. If the patient's symptoms persist after appropriate antibiotic therapy, aspiration of the largest left pelvic sidewall "Lesion" should be considered. 4. Suspicion of mild hepatic steatosis. Indeterminate right hepatic lobe lesion. If definitive characterization is desired in this patient with history of primary malignancy, nonemergent pre and post contrast abdominal MRI could be performed. 5. Hiatal  hernia. These results will be called to the ordering clinician or representative by the Radiology Department at the imaging location.   Electronically Signed   By: Abigail Miyamoto M.D.   On: 11/17/2014 15:17   Mm Digital Screening Bilateral  11/10/2014   CLINICAL DATA:  Screening.  EXAM: DIGITAL SCREENING BILATERAL MAMMOGRAM WITH CAD  COMPARISON:  Previous exam(s).  ACR Breast Density Category b: There are scattered areas of fibroglandular density.  FINDINGS: There are no findings suspicious for malignancy. Images were processed with CAD.  IMPRESSION: No mammographic evidence of malignancy. A result letter of this screening mammogram will be mailed directly to the patient.  RECOMMENDATION: Screening mammogram in one year. (Code:[id number]-B-01Y)  BI-RADS CATEGORY  1: Negative.   Electronically Signed   By: Pamelia Hoit M.D.   On: 11/10/2014 08:10    Labs:  CBC:  Recent Labs  09/09/14 0847 10/20/14 0900 10/27/14 0433 11/25/14 1230  WBC 4.6 6.4 8.8 11.7*  HGB 14.0 14.8 12.9 13.0  HCT 40.1 43.9 39.2 38.4  PLT 221 237 228 445*    COAGS:  Recent Labs  11/25/14 1230  INR 1.22  APTT 33    BMP:  Recent Labs  09/09/14 0847 10/20/14 0900 10/27/14 0433  NA 141 140 139  K 3.7 4.0 4.5  CL 108 104 107  CO2 27 27 25   GLUCOSE 101* 91 174*  BUN 17 12 10   CALCIUM 9.0 9.6 8.8*  CREATININE 0.87 0.91 0.93  GFRNONAA >60 >60 >60  GFRAA >60 >60 >60    LIVER FUNCTION TESTS:  Recent Labs  10/20/14 0900  BILITOT 1.2  AST 26  ALT 33  ALKPHOS 66  PROT 7.5  ALBUMIN 4.4    TUMOR MARKERS: No results for input(s): AFPTM, CEA, CA199, CHROMGRNA in the last 8760 hours.  Assessment and Plan: Ashley Villegas is a 64 y.o. female with history of recently diagnosed stage IA grade 1 endometrial cancer, s/p robotic hysterectomy, BSO and bilateral pelvic lymphadenectomy on 10/26/14. She  recently began to have some lower pelvic pain with intermittent nausea , loose bowel movements, intermittent  nausea with occasional fevers/chills. CT of the abdomen and pelvis on 8/24 revealed bilateral fluid densities  along the pelvic sidewalls (L>R) favoring seromas versus lymphangiomas. The patient has empirically been on Cipro and Flagyl in the interim. She presents today for follow-up CT scan and possible aspiration/drainage of pelvic fluid collection. Details/risks of procedure, including but not limited to, internal bleeding, infection, injury to adjacent organs, inability to place drain discussed with patient and son with their understanding and consent.   Thank you for this interesting consult.  I greatly enjoyed meeting Mahlia de Graffenried and look forward to participating in their care.  A copy of this report was sent to the requesting provider on this date.  Signed: D. Lennette Bihari Allred 11/25/2014, 1:11 PM   I spent a total of 15 minutes in face to face  in clinical consultation, greater than 50% of which was counseling/coordinating care for CT-guided aspiration/possible drainage of pelvic fluid collection

## 2014-11-26 ENCOUNTER — Other Ambulatory Visit: Payer: Self-pay | Admitting: *Deleted

## 2014-11-26 ENCOUNTER — Telehealth: Payer: Self-pay | Admitting: *Deleted

## 2014-11-26 ENCOUNTER — Encounter: Payer: Self-pay | Admitting: *Deleted

## 2014-11-26 ENCOUNTER — Other Ambulatory Visit (HOSPITAL_COMMUNITY)
Admission: AD | Admit: 2014-11-26 | Discharge: 2014-11-26 | Disposition: A | Discharge status: Home / Self Care | Payer: Managed Care, Other (non HMO) | Source: Ambulatory Visit | Attending: Gynecologic Oncology | Admitting: Gynecologic Oncology

## 2014-11-26 ENCOUNTER — Other Ambulatory Visit: Payer: Managed Care, Other (non HMO)

## 2014-11-26 DIAGNOSIS — R197 Diarrhea, unspecified: Secondary | ICD-10-CM

## 2014-11-26 NOTE — Telephone Encounter (Signed)
Received phone call from patient inquiring if home health has been set up for her drain.  Spoke with Deirdre Pippins, hospital [facility] liaison, and she states that RN will come to patient's home for initial visit tomorrow, 11/27/14. Called and notified patient of this. She is agreeable to come to [facility] later this afternoon so RN can review drain flush instructions with patient.  Received faxed copy of specific flush orders for patient's gravity drain from Filomena Jungling, RN in short stay. Drain needs to be flushed with 5cc normal saline three times daily. Orders faxed to Erasmo Downer at [facility] at [phone]. Called and confirmed that orders were received. Erasmo Downer states she did get the orders and RN will see patient tomorrow.

## 2014-11-26 NOTE — Progress Notes (Signed)
Patient came to [facility] - RN reviewed drain flush instructions with patient again as patient reports "being kind of out of it yesterday after the procedure." [facility] patient through how to flush drain and while at [facility] patient performed flush on her own without any problem. Patient verbalized understanding of how often drain needs to be flushed. Instructed patient that [facility] nurse will be coming to her house tomorrow for drain care. No other concerns noted at this time. Patient very appreciative of the time taken to assist her today and is agreeable to call our office back with any additional concerns.   Patient brought stool sample today - brought to [facility] lab to be processed. Told patient our office will call her with the results.

## 2014-11-28 LAB — CULTURE, ROUTINE-ABSCESS: SPECIAL REQUESTS: NORMAL

## 2014-11-30 ENCOUNTER — Telehealth: Payer: Self-pay | Admitting: Nurse Practitioner

## 2014-11-30 NOTE — Telephone Encounter (Signed)
Per Joylene John, NP, Rn calling to check on patient to assess diarrhea and ABD drain status.  Patient reports stool is not watery and formed "like it was when I brought in the sample." patient informed that sample was in fact cancelled for c.dif testing because it was formed. She reports little to no output from drain since Sunday but one episode of bleeding at tube site which required dressing and undergarments to be changed. She states she might have pulled a stitch and bleeding is now resolved. No wound appears to be open, per pt, and home RN has been checking on her. RN informed her to notify our clinic with any new bleeding including vaginal bright red blood. Pt reports she is out of ABX; informed NP and received refill request from pharmacy; NP to refill. RN requested that patient call later this week for update on drain output so that we may move her drain study up prn (now scheduled for 9/14) if no output. She verbalizes understanding and knows to call clinic with new or worsening concerns.

## 2014-12-02 ENCOUNTER — Telehealth: Payer: Self-pay | Admitting: *Deleted

## 2014-12-02 NOTE — Telephone Encounter (Signed)
Called pt advised to call radiology to discuss removal of drain. Pt will come by to pick up saline in the am. No further concerns.

## 2014-12-08 ENCOUNTER — Ambulatory Visit
Admission: RE | Admit: 2014-12-08 | Discharge: 2014-12-08 | Disposition: A | Discharge status: Home / Self Care | Payer: Managed Care, Other (non HMO) | Source: Ambulatory Visit | Attending: Gynecologic Oncology | Admitting: Gynecologic Oncology

## 2014-12-08 ENCOUNTER — Ambulatory Visit
Admission: RE | Admit: 2014-12-08 | Discharge: 2014-12-08 | Disposition: A | Discharge status: Home / Self Care | Payer: Managed Care, Other (non HMO) | Source: Ambulatory Visit | Attending: Interventional Radiology | Admitting: Interventional Radiology

## 2014-12-08 DIAGNOSIS — L0291 Cutaneous abscess, unspecified: Secondary | ICD-10-CM | POA: Insufficient documentation

## 2014-12-08 DIAGNOSIS — Z9889 Other specified postprocedural states: Secondary | ICD-10-CM

## 2014-12-08 MED ORDER — IOPAMIDOL (ISOVUE-300) INJECTION 61%
100.0000 mL | Freq: Once | INTRAVENOUS | Status: AC | PRN
  Administered 2014-12-08: 100 mL via INTRAVENOUS

## 2014-12-08 NOTE — Progress Notes (Signed)
Afebrile.  Patient reports that she has been flushing pelvic drainage catheter tid w/ 5 mL of NSS.  Currently very minimal drainage (<0.5 mL), clear to slightly cloudy.    Patient will complete antibiotics today.

## 2014-12-08 NOTE — Progress Notes (Signed)
Patient ID: Ashley Villegas, female   DOB: 09/06/1950, 64 y.o.   MRN: [id number]       Chief Complaint: Patient was seen in consultation today for  Chief Complaint  Patient presents with  . Follow-up    follow up drainage catheter    at the request of Denman George  Referring Physician(s): Denman George  History of Present Illness: Ashley Villegas is a 64 y.o. female who underwent a hysterectomy for endometrial carcinoma on 10/26/2014 who was subsequently found to have indeterminate Fluid collections within the bilateral hemipelvis on CT scan obtained 11/17/2014 for the evaluation of fever and chills. The patient underwent a CT-guided drainage catheter placement into the dominant left hemipelvis fluid collection on 11/25/2014 and returns today to the interventional radiology clinic to discuss the results of her CT scan performed earlier same day as well as management of her percutaneous drainage catheter.  She is unaccompanied and serves as her own historian.  The patient continues to flush her percutaneous drainage catheter 3 times a day with 5 mL of saline. The drainage catheter is maintained to a gravity bag. She reports minimal to no output from the drainage catheter during the past several days. No fever or chills. No abdominal pain. Normal bladder and bowel function.  Past Medical History  Diagnosis Date  . Hypothyroidism     on synthroid   . Anxiety   . GERD (gastroesophageal reflux disease)   . Arthritis   . Complication of anesthesia   . PONV (postoperative nausea and vomiting)   . Depression   . Cancer     endometrial cancer    Past Surgical History  Procedure Laterality Date  . Diagnostic laparoscopy      fibroid tumors on her ovaries   . Tubal ligation    . Cesarean section    . Dilation and curettage of uterus    . Wisdom tooth extraction    . Knee surgery    . Hysteroscopy w/d&c N/A 09/15/2014    Procedure: DILATATION AND CURETTAGE /HYSTEROSCOPY/MYOSURE RESECTION OF  POLYP;  Surgeon: Janyth Pupa, DO;  Location: [facility];  Service: Gynecology;  Laterality: N/A;  . Robotic assisted total hysterectomy with bilateral salpingo oopherectomy Bilateral 10/26/2014    Procedure: ROBOTIC ASSISTED TOTAL HYSTERECTOMY WITH BILATERAL SALPINGO OOPHORECTOMY;  Surgeon: [name], MD;  Location: [facility];  Service: Gynecology;  Laterality: Bilateral;  . Lymph node dissection N/A 10/26/2014    Procedure: POSS LYMPHADENECTOMY ;  Surgeon: [name], MD;  Location: [facility];  Service: Gynecology;  Laterality: N/A;    Allergies: Hydrocodone  Medications: Prior to Admission medications   Medication Sig Start Date End Date Taking? Authorizing Provider  ciprofloxacin (CIPRO) 250 MG tablet Take 1 tablet (250 mg total) by mouth 2 (two) times daily. 11/23/14  Yes Everitt Amber, MD  FLUoxetine (PROZAC) 40 MG capsule Take 40 mg by mouth daily.   Yes Historical Provider, MD  levothyroxine (SYNTHROID, LEVOTHROID) 100 MCG tablet Take 100 mcg by mouth daily before breakfast. Patient takes mon - fri   Yes Historical Provider, MD  levothyroxine (SYNTHROID, LEVOTHROID) 88 MCG tablet Take 88 mcg by mouth daily before breakfast. Patient takes sat and sun   Yes Historical Provider, MD  metroNIDAZOLE (FLAGYL) 500 MG tablet Take 1 tablet (500 mg total) by mouth 3 (three) times daily. 11/23/14  Yes Everitt Amber, MD  naproxen sodium (ANAPROX) 220 MG tablet Take 220 mg by mouth every 12 (twelve) hours as needed (pain).   Yes Historical Provider, MD  furosemide (LASIX) 40 MG tablet Take 40 mg by mouth daily as needed for fluid.     Historical Provider, MD  oxyCODONE-acetaminophen (PERCOCET/ROXICET) 5-325 MG per tablet Take 1-2 tablets by mouth every 4 (four) hours as needed (moderate to severe pain (when tolerating fluids)). Patient not taking: Reported on 12/08/2014 11/22/14   Everitt Amber, MD  promethazine (PHENERGAN) 12.5 MG tablet Take 1 tablet (12.5 mg total) by mouth every 6 (six) hours as needed for nausea or  vomiting. Patient not taking: Reported on 11/25/2014 11/16/14   Everitt Amber, MD     No family history on file.  Social History   Social History  . Marital Status: Single    Spouse Name: N/A  . Number of Children: N/A  . Years of Education: N/A   Social History Main Topics  . Smoking status: Former Smoker -- 0.50 packs/day for 13 years    Types: Cigarettes    Quit date: 10/20/2007  . Smokeless tobacco: Never Used  . Alcohol Use: No  . Drug Use: No  . Sexual Activity: Not on file   Other Topics Concern  . Not on file   Social History Narrative   ECOG Status: 1 - Symptomatic but completely ambulatory  Review of Systems: A 12 point ROS discussed and pertinent positives are indicated in the HPI above.  All other systems are negative.  Review of Systems  Constitutional: Negative.  Negative for fever, activity change and appetite change.  Respiratory: Negative.   Cardiovascular: Negative.   Gastrointestinal: Negative for abdominal pain and abdominal distention.    Vital Signs: BP 124/69 mmHg  Pulse 65  Temp(Src) 98.6 F (37 C) (Oral)  Resp 14  SpO2 98%  Physical Exam  Constitutional: She appears well-developed and well-nourished.  Abdominal: There is no tenderness.    Location of percutaneous drain.  Nursing note and vitals reviewed.    Imaging: Ct Abdomen Pelvis W Contrast  12/08/2014   CLINICAL DATA:  History of hysterectomy (10/26/2014) for endometrial carcinoma with postoperative fever and chills with abdominal CT obtained 11/17/2014 demonstrating development of indeterminate fluid collections within the bilateral pelvic sidewalls, left greater than right. Patient underwent CT-guided left percutaneous drainage catheter placement on 11/25/2014.  The patient continues to flush were percutaneous drainage catheter with 5 cc 3 times per day with minimal to no output from the percutaneous drainage catheter. No fever or chills. No abdominal pain.  The patient presents to  the Interventional Radiology Clinic for follow-up postprocedural CT of the abdomen pelvis and drainage catheter management.  EXAM: CT ABDOMEN AND PELVIS WITH CONTRAST  TECHNIQUE: Multidetector CT imaging of the abdomen and pelvis was performed using the standard protocol following bolus administration of intravenous contrast.  CONTRAST:  14mL ISOVUE-300 IOPAMIDOL (ISOVUE-300) INJECTION 61%  COMPARISON:  CT abdomen pelvis - 11/17/2014; CT-guided percutaneous drainage catheter placement - 11/25/2014  FINDINGS: Unchanged size and appearance of percutaneous drainage catheter with end coiled and locked within the left hemipelvis with interval complete resolution of previously noted pelvic sidewall collection. Additionally, there has been continued interval decrease in size of lobulated fluid collection within in the right hemipelvis with superficial component now measuring approximately 1.3 x 1.6 cm (image 70, series 2), previously, 1.5 x 1.8 cm and posterior component now measuring approximately 2.6 x 1.0 cm (image 68, series 2), previously, 2.9 x 1.7 cm. No new definable/drainable abdominal or pelvic fluid collections.  Normal hepatic contour. Grossly unchanged approximately 1.2 cm hypo attenuating (28 Hounsfield unit) lesion within the  posterior segment of the right lobe of the liver (image 23, series 2). No new discrete hepatic lesions. Normal appearance of the gallbladder. No radiopaque gallstones. No ascites.  There is symmetric enhancement of the bilateral kidneys. No definite renal stones on this postcontrast examination. Punctate sub cm hypo attenuating lesion within the anterior caudal aspect of the left kidney is too small to adequately characterize of favored to represent renal cysts. No discrete right-sided renal lesions. No urinary obstruction or perinephric stranding. Normal appearance of the bilateral adrenal glands, pancreas and spleen.  Colonic diverticulosis without evidence of diverticulitis. The  bowel is otherwise normal in course and caliber without wall thickening or evidence of obstruction. Normal appearance of the retrocecal appendix. No pneumoperitoneum, pneumatosis or portal venous gas.  Scattered mixed calcified and noncalcified atherosclerotic plaque within a normal caliber abdominal aorta. The major branch vessels of the abdominal aorta appear widely patent on this non CTA examination.  Scattered shotty retroperitoneal lymph nodes are individually not enlarged by size criteria with index left-sided periaortic lymph node measuring 0.4 cm in greatest short axis diameter (image 40, series 2). No bulky retroperitoneal, mesenteric, pelvic or inguinal lymphadenopathy.  Post hysterectomy. Normal appearance of the urinary bladder given underdistention. No free fluid in the pelvic cul-de-sac.  Limited visualization of the lower thorax demonstrates minimal subsegmental atelectasis within the caudal segment of the lingula as well as in the subpleural dependent portion of the right lower lobe. No discrete focal airspace opacities. No pleural effusion.  Borderline cardiomegaly.  No pericardial effusion.  No acute or aggressive osseous abnormalities. Mild-to-moderate multilevel lumbar spine DDD, worse at L3-L4 and L4-L5 with disc space height loss, endplate irregularity and sclerosis.  Small mesenteric fat containing periumbilical hernia. Regional soft tissues appear otherwise normal.  IMPRESSION: 1. Complete resolution of dominant fluid collection within the left hemipelvis following percutaneous drainage catheter placement. This percutaneous drainage catheter was subsequent removed intact at the patient's bedside. 2. Continued decrease in size of bilobed fluid collections within the right hemipelvis with superficial component measuring 1.6 cm and posterior component measuring 2.6 cm, indeterminate though both favored to represent evolving seromas. 3. Unchanged indeterminate approximately 1.2 cm hepatic lesion.  Further evaluation with contrast-enhanced abdominal MRI could be performed as clinically indicated. 4. Colonic diverticulosis without evidence of diverticulitis.   Electronically Signed   By: Sandi Mariscal M.D.   On: 12/08/2014 11:55   Ct Abdomen Pelvis W Contrast  11/17/2014   CLINICAL DATA:  Hysterectomy 10/26/2014 for endometrial carcinoma. Started having nausea and fever with chills yesterday. Lower abdominal pain. Rule out pelvic abscess.  EXAM: CT ABDOMEN AND PELVIS WITH CONTRAST  TECHNIQUE: Multidetector CT imaging of the abdomen and pelvis was performed using the standard protocol following bolus administration of intravenous contrast.  CONTRAST:  185mL OMNIPAQUE IOHEXOL 300 MG/ML  SOLN  COMPARISON:  None.  FINDINGS: Lower chest: Subsegmental atelectasis at both lung bases. Mild cardiomegaly, without pericardial or pleural effusion. Small hiatal hernia.  Hepatobiliary: Suspect mild hepatic steatosis. A right hepatic lobe (segment 6) 11 mm low-density lesion image 23 of series 2.  Focal steatosis adjacent the falciform ligament. Normal gallbladder, without biliary ductal dilatation.  Pancreas: Normal, without mass or ductal dilatation.  Spleen: Normal  Adrenals/Urinary Tract: Normal adrenal glands. Normal kidneys, without hydronephrosis. The bladder is decompressed on portal venous phase images. Mild thickening and wall irregularity could be at least partially secondary. Example image 73 of series 2.  Stomach/Bowel: Normal remainder of the stomach. Scattered colonic diverticula. Normal small  bowel.  Vascular/Lymphatic: Mild aortic atherosclerosis. No retroperitoneal or retrocrural adenopathy. Right pelvic sidewall fluid density foci. Example at 2.9 x 1.7 cm on image 67 of series 2.  Fluid density within the left external iliac station of the pelvic sidewall measures 5.1 x 3.3 cm on image 68 of series 2. No soft tissue density components or gas within. Extension or adjacent fluid is identified more superiorly  on image 62.  Reproductive: Hysterectomy.  No adnexal mass.  Other: Edema is identified throughout the pelvis, greater on the left.  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 Trace L4-5 retrolisthesis which is likely degenerative. Mild disc bulges, including at L4-5 and L5-S1.  IMPRESSION: 1. Status post hysterectomy. Pelvic edema which is greater than typically seen 3 weeks postop. Suspicious for postoperative infection. 2. Bladder wall thickening and irregularity. Although this could be partially due to underdistention, Concurrent cystitis cannot be excluded. 3. Bilateral fluid density lesions along the pelvic sidewalls. Favored to represent seromas or lymphangiomas. No specific features to suggest abscess. If the patient's symptoms persist after appropriate antibiotic therapy, aspiration of the largest left pelvic sidewall "Lesion" should be considered. 4. Suspicion of mild hepatic steatosis. Indeterminate right hepatic lobe lesion. If definitive characterization is desired in this patient with history of primary malignancy, nonemergent pre and post contrast abdominal MRI could be performed. 5. Hiatal hernia. These results will be called to the ordering clinician or representative by the Radiology Department at the imaging location.   Electronically Signed   By: Abigail Miyamoto M.D.   On: 11/17/2014 15:17   Mm Digital Screening Bilateral  11/10/2014   CLINICAL DATA:  Screening.  EXAM: DIGITAL SCREENING BILATERAL MAMMOGRAM WITH CAD  COMPARISON:  Previous exam(s).  ACR Breast Density Category b: There are scattered areas of fibroglandular density.  FINDINGS: There are no findings suspicious for malignancy. Images were processed with CAD.  IMPRESSION: No mammographic evidence of malignancy. A result letter of this screening mammogram will be mailed directly to the patient.  RECOMMENDATION: Screening mammogram in one year. (Code:[id number]-B-01Y)  BI-RADS CATEGORY  1: Negative.   Electronically Signed    By: Pamelia Hoit M.D.   On: 11/10/2014 08:10   Ct Image Guided Drainage Percut Cath  Peritoneal Retroperit  11/25/2014   CLINICAL DATA:  Bilateral pelvic collections post hysterectomy. Pain.  EXAM: CT GUIDED DRAINAGE OF LEFT PELVIC ABSCESS  ANESTHESIA/SEDATION: Intravenous Fentanyl and Versed were administered as conscious sedation during continuous cardiorespiratory monitoring by the radiology RN, with a total moderate sedation time of 19 minutes.  PROCEDURE: The procedure, risks, benefits, and alternatives were explained to the patient. Questions regarding the procedure were encouraged and answered. The patient understands and consents to the procedure.  select axial scans through the pelvis were obtained. An appropriate skin entry site was determined and marked.  The operative field was prepped with chlorhexidinein a sterile fashion, and a sterile drape was applied covering the operative field. A sterile gown and sterile gloves were used for the procedure. Local anesthesia was provided with 1% Lidocaine.  Under CT fluoroscopic guidance, an 18 gauge trocar needle was advanced into the dominant left pelvic collection. A trace amount of purulent appearing material could be aspirated. An Amplatz guidewire advanced easily within the collection, position confirmed on CT fluoro. Tract was dilated to facilitate placement of a 12 French pigtail catheter. Approximately 10 mL of flocculent material were aspirated, sent for Gram stain and culture. The catheter was secured externally with 0 Prolene suture and StatLock  and placed to gravity drainage. The patient tolerated the procedure well.  COMPLICATIONS: None immediate  FINDINGS: Pelvic CT again confirms bilateral pelvic sidewall fluid collections, left greater than right, without significant improvement since previous scan of 824. Moderate regional inflammatory/ edematous changes on the left. A small amount of purulent material could be aspirated from the dominant left  collection. A 12 French pigtail drain was placed.  IMPRESSION: 1. Persistent bilateral pelvic sidewall fluid collections, left greater than right, with left-sided inflammatory changes. 2. Technically successful 12 French left pelvic abscess drain catheter placement. A sample of the aspirate was sent for Gram stain, culture and sensitivity.   Electronically Signed   By: Lucrezia Europe M.D.   On: 11/25/2014 16:20    Labs:  CBC:  Recent Labs  09/09/14 0847 10/20/14 0900 10/27/14 0433 11/25/14 1230  WBC 4.6 6.4 8.8 11.7*  HGB 14.0 14.8 12.9 13.0  HCT 40.1 43.9 39.2 38.4  PLT 221 237 228 445*    COAGS:  Recent Labs  11/25/14 1230  INR 1.22  APTT 33    BMP:  Recent Labs  09/09/14 0847 10/20/14 0900 10/27/14 0433  NA 141 140 139  K 3.7 4.0 4.5  CL 108 104 107  CO2 27 27 25   GLUCOSE 101* 91 174*  BUN 17 12 10   CALCIUM 9.0 9.6 8.8*  CREATININE 0.87 0.91 0.93  GFRNONAA >60 >60 >60  GFRAA >60 >60 >60    LIVER FUNCTION TESTS:  Recent Labs  10/20/14 0900  BILITOT 1.2  AST 26  ALT 33  ALKPHOS 66  PROT 7.5  ALBUMIN 4.4    TUMOR MARKERS: No results for input(s): AFPTM, CEA, CA199, CHROMGRNA in the last 8760 hours.  Assessment and Plan:  Ranay Ketter is a 64 y.o. female who underwent a hysterectomy for endometrial carcinoma on 10/26/2014 who was subsequently found to have indeterminate fluid collections within the bilateral hemipelvis on CT scan obtained 11/17/2014 for the evaluation of fever and chills. The patient underwent a CT-guided drainage catheter placement into the dominant left hemipelvis fluid collection on 11/25/2014 and returns today to the interventional radiology clinic to discuss the results of her CT scan performed earlier same day as well as management of her percutaneous drainage catheter.   Review of CT of the abdomen and pelvis performed earlier same day demonstrates complete resolution of the fluid collection within the left hemipelvis.  Additionally, there is been continued interval decrease in size of the bilobed fluid collections within the right hemipelvis.   Given complete resolution of the fluid collection on CT imaging as well as lack of any significant output from the percutaneous drainage catheter, the percutaneous drainage catheter was removed intact without incident.  While the residual fluid collections within the right hemipelvis are technically indeterminate, given interval decrease in size, I favor these to represent evolving seromas do not currently require dedicated intervention.  Follow-up imaging is only recommended if the patient were to develop recurrent symptoms.  I greatly enjoyed meeting Ashley Villegas and look forward to participating in her care.  A copy of this report was sent to the requesting provider on this date.  SignedSandi Mariscal 12/08/2014, 12:47 PM   I spent a total of 10 Minutes in face to face in clinical consultation, greater than 50% of which was counseling/coordinating care for postoperative abscess, post percutaneous drainage catheter placement.

## 2014-12-10 ENCOUNTER — Encounter: Payer: Self-pay | Admitting: Gynecologic Oncology

## 2014-12-13 ENCOUNTER — Telehealth: Payer: Self-pay | Admitting: Gynecologic Oncology

## 2014-12-13 NOTE — Telephone Encounter (Signed)
Patient called this am with complaints of moderate abdominal discomfort.  Stating yesterday was a bad day with significant menstrual-like abdominal cramping.  She is unable to sit or stand for long periods of time.  She sweats profusely when she does any activity.  The abdominal pain is not as bad today. No nausea, vomiting, diarrhea.  Having more frequent BMs but not loose.  She has noted old blood with wiping once.  No change in appetite.  Situation discussed with Dr. Denman George, who recommended another CT scan.  Situation discussed with patient, who would like to wait to see Dr. Alycia Rossetti on Wed.  She states since the pain has improved from yesterday and Aleve offers some relief.  She will call if the pain worsens so a CT scan be set up prior to her appt.  Advised to call for any questions or concerns.  Reportable signs and symptoms reviewed.

## 2014-12-15 ENCOUNTER — Ambulatory Visit: Payer: Managed Care, Other (non HMO) | Attending: Gynecologic Oncology | Admitting: Gynecologic Oncology

## 2014-12-15 ENCOUNTER — Encounter: Payer: Self-pay | Admitting: Gynecologic Oncology

## 2014-12-15 VITALS — BP 135/67 | HR 72 | Temp 98.4°F | Resp 18 | Ht 60.5 in | Wt 165.2 lb

## 2014-12-15 DIAGNOSIS — C541 Malignant neoplasm of endometrium: Secondary | ICD-10-CM

## 2014-12-15 NOTE — Progress Notes (Signed)
POSTOPERATIVE FOLLOWUP CC: left pelvic pain   ASSESSMENT AND PLAN:  64 year old woman with stage IA grade 1 endometrial cancer, 7 weeks postop who had a symptomatic left pelvic lymphocyst. These were drained and she took Flagyl for them. She's having some looser stools which could be related to the antibiotics or potentially her diet. We discussed this at length. She was encouraged not to overdo that as she continues to heal. She'll be released in allowed to go back to work.  She works at home and is fairly sedentary. She return to see [name] in 6 months but knows to call us if she has any issues before then. She doesn't like she's having an increase in her hot flashes. We discussed that the ovaries -2 been producing some estrogenic for her and if the hot flashes do not improve we could do a short-term course of low-dose Estring. At this time she does not wish to proceed that way I'll continue to follow this expectantly. She'll return to see [name] in 6 months when necessary.   HPI:  Ashley Villegas is a 64 y.o. year old No obstetric history on file. initially seen in consultation on 09/30/14, referred by Dr Nelda Marseille for grade 1 endometrial cancer.  She then underwent a robotic hysterectomy, BSO and bilateral pelvic node dissection on 05/02/76 without complications.  Her postoperative course was uncomplicated .  Her final pathologic diagnosis is a Stage IA Grade 1 endometrioid endometrial cancer with no  lymphovascular space invasion, 5/15 mm (33%) of myometrial invasion and negative lymph nodes.  IMPRESSION: 1. Status post hysterectomy. Pelvic edema which is greater than typically seen 3 weeks postop. Suspicious for postoperative infection. 2. Bladder wall thickening and irregularity. Although this could be partially due to underdistention, Concurrent cystitis cannot be excluded. 3. Bilateral fluid density lesions along the pelvic sidewalls. Favored to represent seromas or lymphangiomas. No specific features  to suggest abscess. If the patient's symptoms persist after appropriate antibiotic therapy, aspiration of the largest left pelvic sidewall "Lesion" should be considered. 4. Suspicion of mild hepatic steatosis. Indeterminate right hepatic lobe lesion. If definitive characterization is desired in this patient with history of primary malignancy, nonemergent pre and post contrast abdominal MRI could be performed. 5. Hiatal hernia.  IMPRESSION: 1. Complete resolution of dominant fluid collection within the left hemipelvis following percutaneous drainage catheter placement. This percutaneous drainage catheter was subsequent removed intact at the patient's bedside. 2. Continued decrease in size of bilobed fluid collections within the right hemipelvis with superficial component measuring 1.6 cm and posterior component measuring 2.6 cm, indeterminate though both favored to represent evolving seromas. 3. Unchanged indeterminate approximately 1.2 cm hepatic lesion. 4. Colonic diverticulosis without evidence of diverticulitis.  She comes in today for her postoperative check. She continues to complain of some pelvic discomfort at the drain site. She states is worse if it's sitting she's able to lie down and goes away. She called Sunday with pain and she also had a small amount of blood. That is no longer the case. She states she overdid on Saturday cleaning her house. She has normal bowel movements but about 4-5 per day. She is drinking a lot of fluid and eating a lot of fruit and we discussed that this could be part of the issue. Her stools are not watery they're soft and pasty.  Current Outpatient Prescriptions on File Prior to Visit  Medication Sig Dispense Refill  . FLUoxetine (PROZAC) 40 MG capsule Take 40 mg by mouth daily.    Marland Kitchen  furosemide (LASIX) 40 MG tablet Take 40 mg by mouth daily as needed for fluid.     Marland Kitchen levothyroxine (SYNTHROID, LEVOTHROID) 100 MCG tablet Take 100 mcg by mouth daily before  breakfast. Patient takes mon - fri    . levothyroxine (SYNTHROID, LEVOTHROID) 88 MCG tablet Take 88 mcg by mouth daily before breakfast. Patient takes sat and sun    . metroNIDAZOLE (FLAGYL) 500 MG tablet Take 1 tablet (500 mg total) by mouth 3 (three) times daily. 21 tablet 0  . oxyCODONE-acetaminophen (PERCOCET/ROXICET) 5-325 MG per tablet Take 1-2 tablets by mouth every 4 (four) hours as needed (moderate to severe pain (when tolerating fluids)). (Patient not taking: Reported on 12/08/2014) 30 tablet 0  . promethazine (PHENERGAN) 12.5 MG tablet Take 1 tablet (12.5 mg total) by mouth every 6 (six) hours as needed for nausea or vomiting. (Patient not taking: Reported on 11/25/2014) 10 tablet 0   No current facility-administered medications on file prior to visit.   Allergies  Allergen Reactions  . Hydrocodone Rash   Past Medical History  Diagnosis Date  . Hypothyroidism     on synthroid   . Anxiety   . GERD (gastroesophageal reflux disease)   . Arthritis   . Complication of anesthesia   . PONV (postoperative nausea and vomiting)   . Depression   . Cancer     endometrial cancer   Past Surgical History  Procedure Laterality Date  . Diagnostic laparoscopy      fibroid tumors on her ovaries   . Tubal ligation    . Cesarean section    . Dilation and curettage of uterus    . Wisdom tooth extraction    . Knee surgery    . Hysteroscopy w/d&c N/A 09/15/2014    Procedure: DILATATION AND CURETTAGE /HYSTEROSCOPY/MYOSURE RESECTION OF POLYP;  Surgeon: Janyth Pupa, DO;  Location: [facility];  Service: Gynecology;  Laterality: N/A;  . Robotic assisted total hysterectomy with bilateral salpingo oopherectomy Bilateral 10/26/2014    Procedure: ROBOTIC ASSISTED TOTAL HYSTERECTOMY WITH BILATERAL SALPINGO OOPHORECTOMY;  Surgeon: [name], MD;  Location: [facility];  Service: Gynecology;  Laterality: Bilateral;  . Lymph node dissection N/A 10/26/2014    Procedure: POSS LYMPHADENECTOMY ;  Surgeon: [name],  MD;  Location: [facility];  Service: Gynecology;  Laterality: N/A;   No family history on file. Social History   Social History  . Marital Status: Single    Spouse Name: N/A  . Number of Children: N/A  . Years of Education: N/A   Occupational History  . Not on file.   Social History Main Topics  . Smoking status: Former Smoker -- 0.50 packs/day for 13 years    Types: Cigarettes    Quit date: 10/20/2007  . Smokeless tobacco: Never Used  . Alcohol Use: No  . Drug Use: No  . Sexual Activity: Not on file   Other Topics Concern  . Not on file   Social History Narrative   Physical Exam: Blood pressure 135/67, pulse 72, temperature 98.4 F (36.9 C), temperature source Oral, resp. rate 18, height 5' 0.5" (1.537 m), weight 165 lb 3.2 oz (74.934 kg), SpO2 99 %. General: Well dressed, well nourished in no apparent distress.    Abdomen:  Soft, tender on left to deep palpation, nondistended.  No palpable masses.  No hepatosplenomegaly.  No ascites. Normal bowel sounds.  No hernias.  Incisions are clean dry and intact. No rebound or guarding or masses.  Genitourinary: Normal female genitalia. Vagina  is atrophic. The vaginal cuff is visualized. There's some irritation along the suture line the suture line is intact. There is no blood noted. Bimanual examination was no masses or fullness at the vaginal cuff. The vaginal cuff is nontender.  [name] A., MD

## 2014-12-15 NOTE — Patient Instructions (Signed)
Plan to follow up with Dr. Denman George or Dr. Alycia Rossetti in six months or sooner if needed.  Please call closer to the date (end of Dec or Jan) to schedule your appt.  Plan to return to work on Monday, Sept 26 full time with no restrictions.

## 2015-06-01 ENCOUNTER — Ambulatory Visit: Payer: Managed Care, Other (non HMO) | Attending: Gynecologic Oncology | Admitting: Gynecologic Oncology

## 2015-06-01 ENCOUNTER — Encounter: Payer: Self-pay | Admitting: Gynecologic Oncology

## 2015-06-01 ENCOUNTER — Ambulatory Visit: Payer: Managed Care, Other (non HMO) | Admitting: Gynecologic Oncology

## 2015-06-01 VITALS — BP 137/58 | HR 70 | Temp 98.2°F | Resp 18 | Ht 60.5 in | Wt 159.3 lb

## 2015-06-01 DIAGNOSIS — Z8542 Personal history of malignant neoplasm of other parts of uterus: Secondary | ICD-10-CM | POA: Diagnosis not present

## 2015-06-01 DIAGNOSIS — R102 Pelvic and perineal pain: Secondary | ICD-10-CM | POA: Diagnosis present

## 2015-06-01 DIAGNOSIS — Z90710 Acquired absence of both cervix and uterus: Secondary | ICD-10-CM | POA: Insufficient documentation

## 2015-06-01 DIAGNOSIS — C541 Malignant neoplasm of endometrium: Secondary | ICD-10-CM

## 2015-06-01 DIAGNOSIS — K5730 Diverticulosis of large intestine without perforation or abscess without bleeding: Secondary | ICD-10-CM | POA: Diagnosis not present

## 2015-06-01 DIAGNOSIS — K449 Diaphragmatic hernia without obstruction or gangrene: Secondary | ICD-10-CM | POA: Diagnosis not present

## 2015-06-01 DIAGNOSIS — R188 Other ascites: Secondary | ICD-10-CM | POA: Insufficient documentation

## 2015-06-01 NOTE — Patient Instructions (Addendum)
Return to clinic in 12 months and see Dr. Suanne Marker in 6 months.

## 2015-06-01 NOTE — Progress Notes (Signed)
GYN oncology follow-up visit. CC: left pelvic pain   ASSESSMENT AND PLAN:  65 year old woman with stage IA grade 1 endometrial cancer status post definitive surgery on 10/26/2014. She has no evidence of recurrent disease. She'll return to see Dr. Nelda Marseille in 6 months. At that time she'll need a Pap smear. She will follow-up with GYN oncology in one year.   HPI:  Ashley Villegas is a 65 y.o. year old No obstetric history on file. initially seen in consultation on 09/30/14, referred by Dr Nelda Marseille for grade 1 endometrial cancer.  She then underwent a robotic hysterectomy, BSO and bilateral pelvic node dissection on [id number] without complications.  Her postoperative course was uncomplicated .  Her final pathologic diagnosis is a Stage IA Grade 1 endometrioid endometrial cancer with no  lymphovascular space invasion, 5/15 mm (33%) of myometrial invasion and negative lymph nodes. Postoperatively she developed bilateral symptomatic lymphocyst. There were drained and she was treated with antibiotics.  IMPRESSION: 1. Status post hysterectomy. Pelvic edema which is greater than typically seen 3 weeks postop. Suspicious for postoperative infection. 2. Bladder wall thickening and irregularity. Although this could be partially due to underdistention, Concurrent cystitis cannot be excluded. 3. Bilateral fluid density lesions along the pelvic sidewalls. Favored to represent seromas or lymphangiomas. No specific features to suggest abscess. If the patient's symptoms persist after appropriate antibiotic therapy, aspiration of the largest left pelvic sidewall "Lesion" should be considered. 4. Suspicion of mild hepatic steatosis. Indeterminate right hepatic lobe lesion. If definitive characterization is desired in this patient with history of primary malignancy, nonemergent pre and post contrast abdominal MRI could be performed. 5. Hiatal hernia.  IMPRESSION: 1. Complete resolution of dominant fluid collection within the  left hemipelvis following percutaneous drainage catheter placement. This percutaneous drainage catheter was subsequent removed intact at the patient's bedside. 2. Continued decrease in size of bilobed fluid collections within the right hemipelvis with superficial component measuring 1.6 cm and posterior component measuring 2.6 cm, indeterminate though both favored to represent evolving seromas. 3. Unchanged indeterminate approximately 1.2 cm hepatic lesion. 4. Colonic diverticulosis without evidence of diverticulitis.  Interval History:  She's overall doing very well. She has intentionally lost about 6 pounds since we last saw her. She was seen by her primary care physician and her cholesterol was 308. With diet she's been repair cholesterol down to 245. She's not been able to exercise much as she's been quite busy. She is helping take care of her 64 year old next or neighborhood is not having any family. She is a little worried that he is becoming too dependent on her. This week and she'll be helping take care of her grandson as her daughter will be out of town. She is up-to-date on her mammograms. Her brother has been diagnosed with type 2 diabetes.  Review of Systems  Constitutional: Taking her prozac for stress, Denies fever.  Cardiovascular: No chest pain, shortness of breath, or edema  Pulmonary: No cough Gastro Intestinal: No nausea, vomiting, constipation, or diarrhea reported. No change in bowel movement.  Genitourinary: Denies vaginal bleeding and discharge.  Psychology: Stressed   Current Outpatient Prescriptions on File Prior to Visit  Medication Sig Dispense Refill  . FLUoxetine (PROZAC) 40 MG capsule Take 40 mg by mouth daily.    . furosemide (LASIX) 40 MG tablet Take 40 mg by mouth daily as needed for fluid.     Marland Kitchen levothyroxine (SYNTHROID, LEVOTHROID) 100 MCG tablet Take 100 mcg by mouth daily before breakfast. Patient takes mon -  fri    . levothyroxine (SYNTHROID, LEVOTHROID)  88 MCG tablet Take 88 mcg by mouth daily before breakfast. Patient takes sat and sun     No current facility-administered medications on file prior to visit.   Allergies  Allergen Reactions  . Hydrocodone Rash   Past Medical History  Diagnosis Date  . Hypothyroidism     on synthroid   . Anxiety   . GERD (gastroesophageal reflux disease)   . Arthritis   . Complication of anesthesia   . PONV (postoperative nausea and vomiting)   . Depression   . Cancer [facility])     endometrial cancer   Past Surgical History  Procedure Laterality Date  . Diagnostic laparoscopy      fibroid tumors on her ovaries   . Tubal ligation    . Cesarean section    . Dilation and curettage of uterus    . Wisdom tooth extraction    . Knee surgery    . Hysteroscopy w/d&c N/A 09/15/2014    Procedure: DILATATION AND CURETTAGE /HYSTEROSCOPY/MYOSURE RESECTION OF POLYP;  Surgeon: Janyth Pupa, DO;  Location: [facility];  Service: Gynecology;  Laterality: N/A;  . Robotic assisted total hysterectomy with bilateral salpingo oopherectomy Bilateral 10/26/2014    Procedure: ROBOTIC ASSISTED TOTAL HYSTERECTOMY WITH BILATERAL SALPINGO OOPHORECTOMY;  Surgeon: [name], MD;  Location: [facility];  Service: Gynecology;  Laterality: Bilateral;  . Lymph node dissection N/A 10/26/2014    Procedure: POSS LYMPHADENECTOMY ;  Surgeon: [name], MD;  Location: [facility];  Service: Gynecology;  Laterality: N/A;   History reviewed. No pertinent family history. Social History   Social History  . Marital Status: Single    Spouse Name: N/A  . Number of Children: N/A  . Years of Education: N/A   Occupational History  . Not on file.   Social History Main Topics  . Smoking status: Former Smoker -- 0.50 packs/day for 13 years    Types: Cigarettes    Quit date: 10/20/2007  . Smokeless tobacco: Never Used  . Alcohol Use: No  . Drug Use: No  . Sexual Activity: Not on file   Other Topics Concern  . Not on file   Social History  Narrative   Physical Exam: Blood pressure 137/58, pulse 70, temperature 98.2 F (36.8 C), temperature source Oral, resp. rate 18, height 5' 0.5" (1.537 m), weight 159 lb 4.8 oz (72.258 kg), SpO2 99 %. General: Well dressed, well nourished in no apparent distress.    Neck: Supple, no lymphadenopathy, no thyromegaly.  Lungs: Clear to auscultation bilaterally.  Cardiovascular: Regular rate and rhythm.  Abdomen:  Soft, tender on left to deep palpation, nondistended.  No palpable masses.  No hepatosplenomegaly.  No ascites. Normal bowel sounds.  No hernias.  Incisions are well healed. No rebound or guarding or masses.  Groins: No lymphadenopathy.  Extremities: No edema  Genitourinary: Normal female genitalia. Vagina is atrophic. The vaginal cuff is visualized. There are no visible lesions. There is no discharge or blood. Bimanual examination reveals no masses or nodularity. Rectal confirms.  [name] A., MD

## 2015-10-18 ENCOUNTER — Other Ambulatory Visit: Payer: Self-pay | Admitting: Internal Medicine

## 2015-10-18 DIAGNOSIS — Z1231 Encounter for screening mammogram for malignant neoplasm of breast: Secondary | ICD-10-CM

## 2015-12-01 ENCOUNTER — Ambulatory Visit
Admission: RE | Admit: 2015-12-01 | Discharge: 2015-12-01 | Disposition: A | Discharge status: Home / Self Care | Payer: Managed Care, Other (non HMO) | Source: Ambulatory Visit | Attending: Internal Medicine | Admitting: Internal Medicine

## 2015-12-01 DIAGNOSIS — Z1231 Encounter for screening mammogram for malignant neoplasm of breast: Secondary | ICD-10-CM

## 2016-08-04 IMAGING — CT CT ABD-PELV W/ CM
2 of 6 series · 16 of 46 positions shown, 18 images · IV contrast (OMNIPAQUE)
Comparison: None.

CLINICAL DATA: Hysterectomy 10/26/2014 for endometrial carcinoma.
Started having nausea and fever with chills yesterday. Lower
abdominal pain. Rule out pelvic abscess.

EXAM:
CT ABDOMEN AND PELVIS WITH CONTRAST
TECHNIQUE: Multidetector CT imaging of the abdomen and pelvis was performed
using the standard protocol following bolus administration of
intravenous contrast.
CONTRAST:  100mL OMNIPAQUE IOHEXOL 300 MG/ML  SOLN

[Series 2: rtn ap with st · axial · 0.79mm/px · z∈[-434,-59]mm · 13 of 87 slices shown, 15 images]
[im 6/87  soft-tissue]
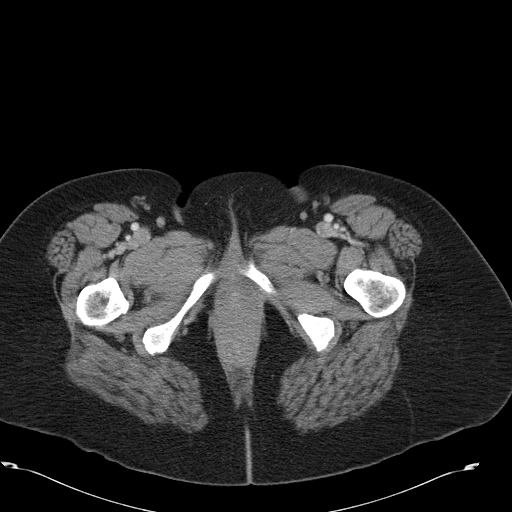
[im 6/87  bone]
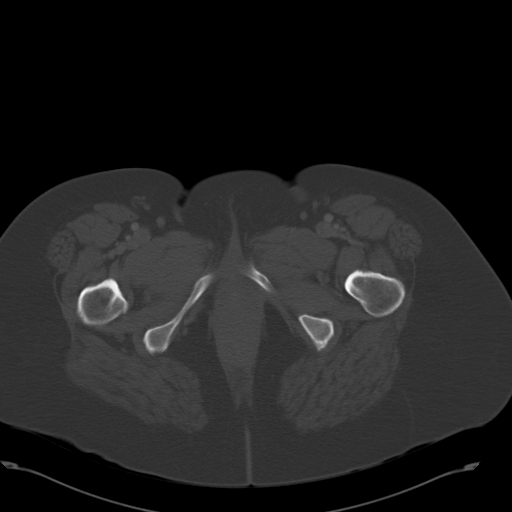
[im 11/87  soft-tissue]
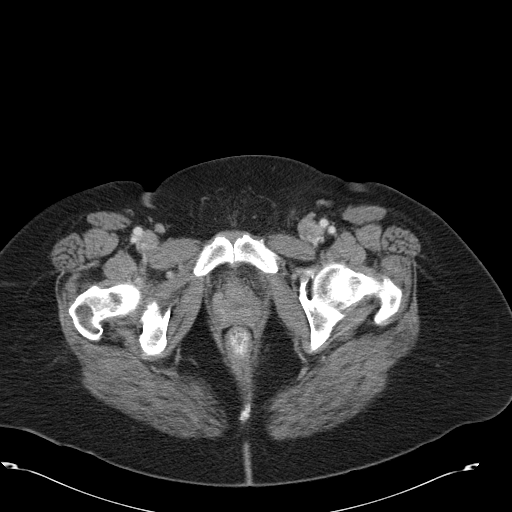
[im 21/87  soft-tissue]
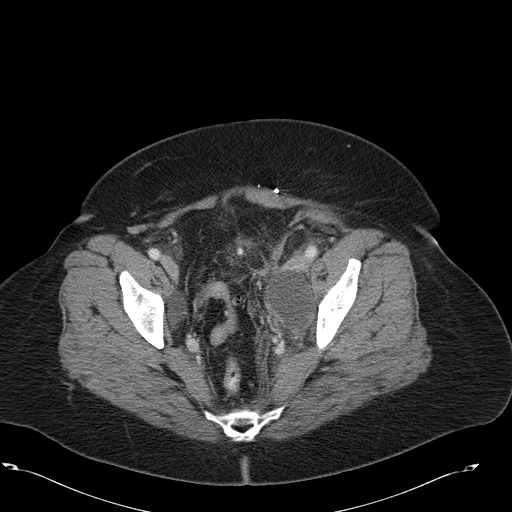
[im 26/87  soft-tissue]
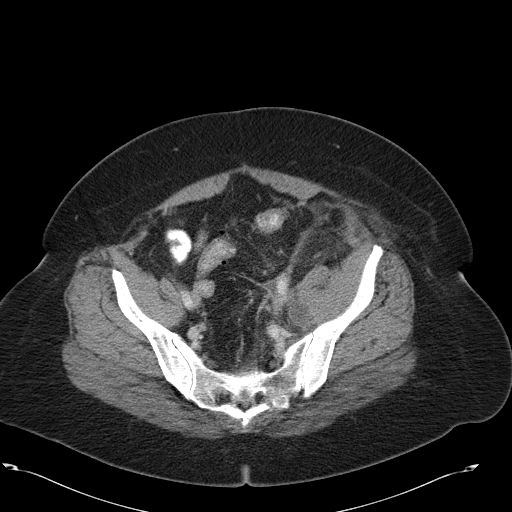
[im 31/87  soft-tissue]
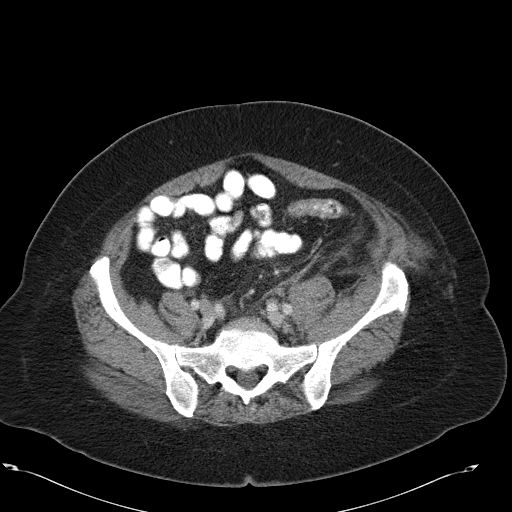
[im 36/87  soft-tissue]
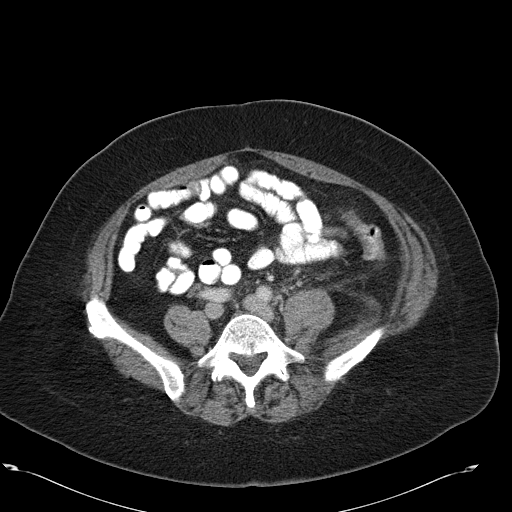
[im 46/87  soft-tissue]
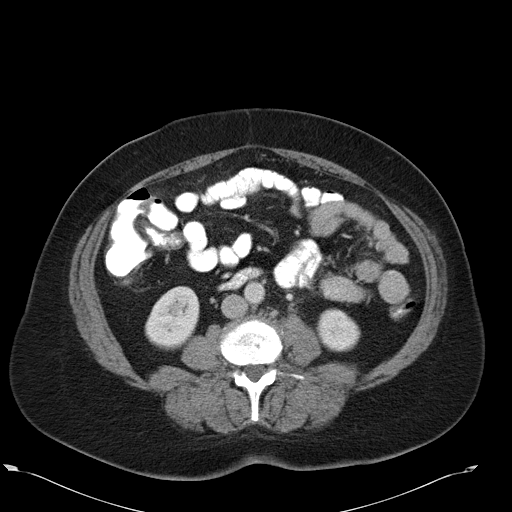
[im 51/87  soft-tissue]
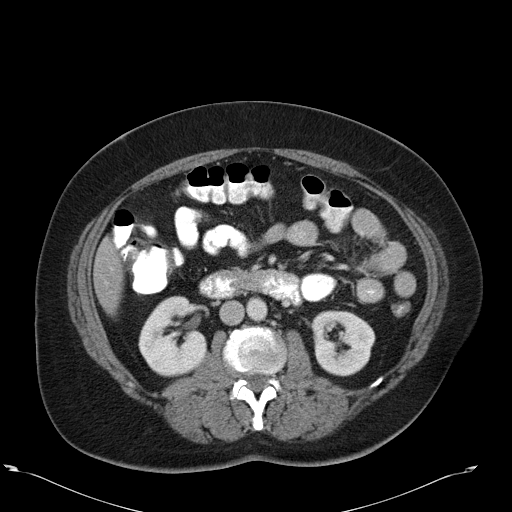
[im 56/87  soft-tissue]
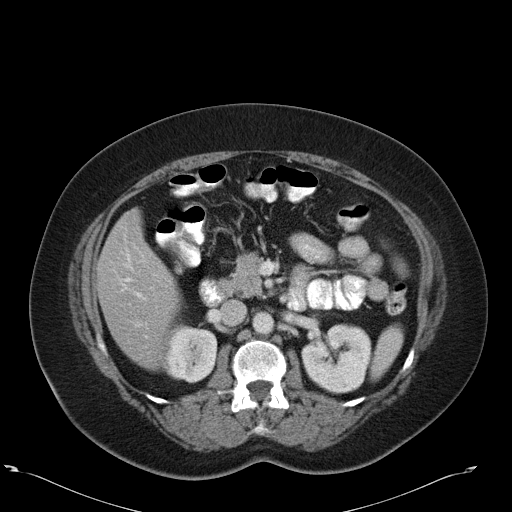
[im 56/87  bone]
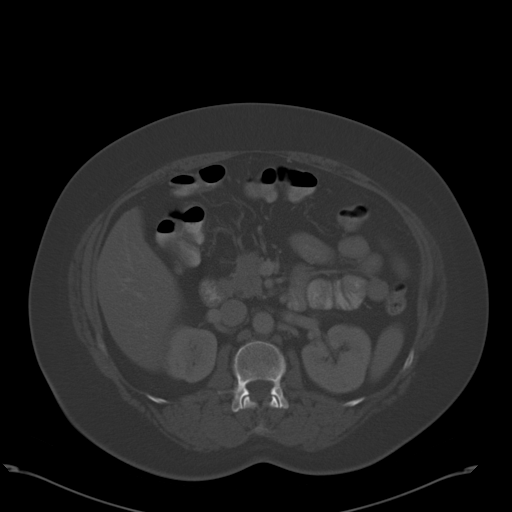
[im 61/87  soft-tissue]
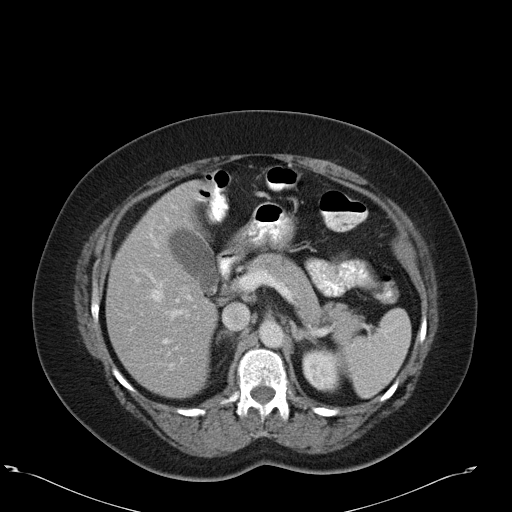
[im 66/87  soft-tissue]
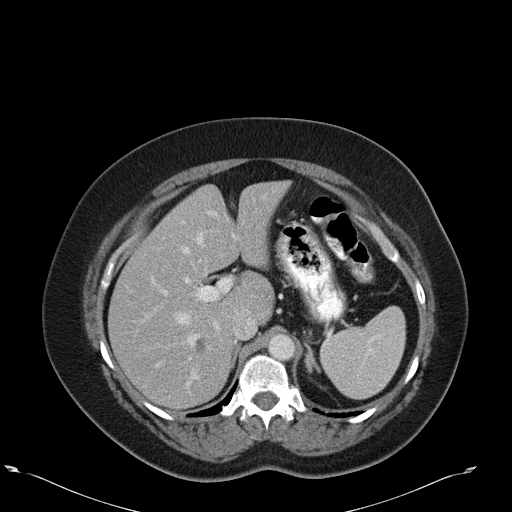
[im 76/87  soft-tissue]
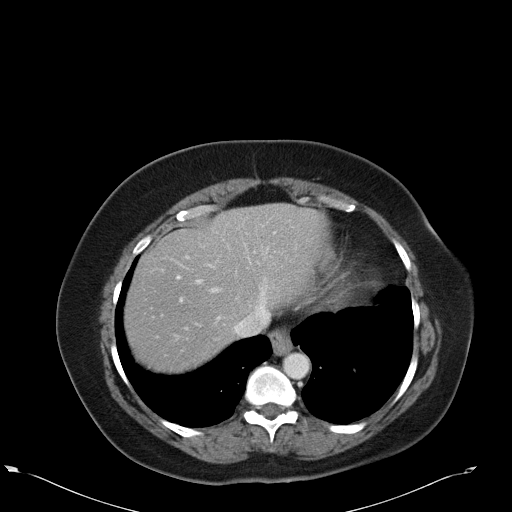
[im 81/87  soft-tissue]
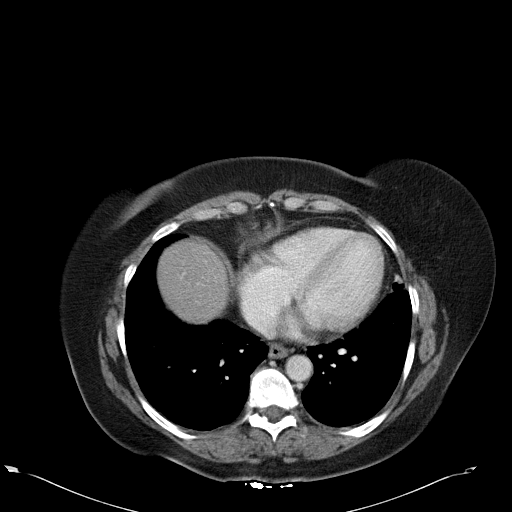

[Series 602: cor · coronal · 0.88mm/px · 3 of 91 slices shown]
[im 31/91  soft-tissue]
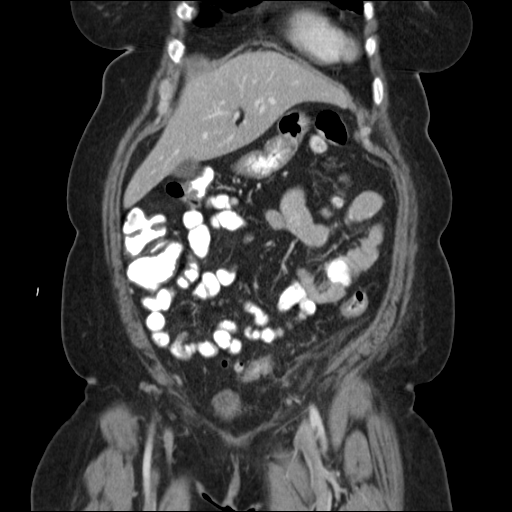
[im 41/91  soft-tissue]
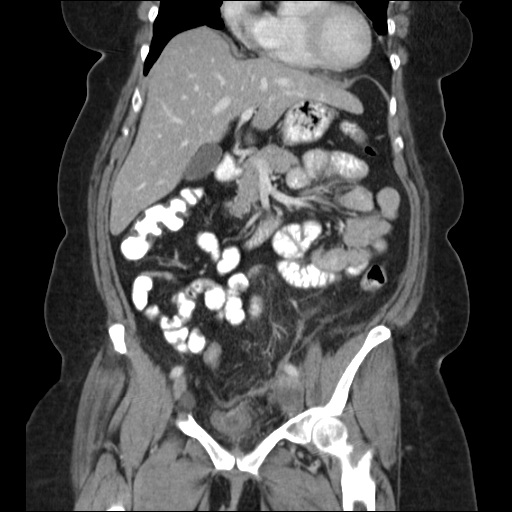
[im 51/91  soft-tissue]
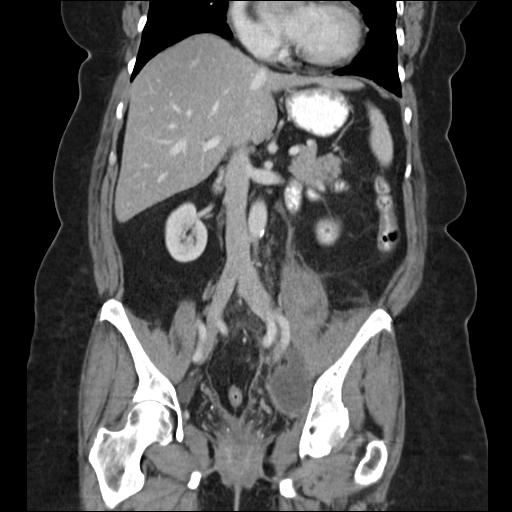

[16 of 46 positions shown; findings below may reference images not displayed]

FINDINGS: Lower chest: Subsegmental atelectasis at both lung bases. Mild
cardiomegaly, without pericardial or pleural effusion. Small hiatal
hernia.

Hepatobiliary: Suspect mild hepatic steatosis. A right hepatic lobe
(segment 6) 11 mm low-density lesion image 23 of series 2.

Focal steatosis adjacent the falciform ligament. Normal gallbladder,
without biliary ductal dilatation.

Pancreas: Normal, without mass or ductal dilatation.

Spleen: Normal

Adrenals/Urinary Tract: Normal adrenal glands. Normal kidneys,
without hydronephrosis. The bladder is decompressed on portal venous
phase images. Mild thickening and wall irregularity could be at
least partially secondary. Example image 73 of series 2.

Stomach/Bowel: Normal remainder of the stomach. Scattered colonic
diverticula. Normal small bowel.

Vascular/Lymphatic: Mild aortic atherosclerosis. No retroperitoneal
or retrocrural adenopathy. Right pelvic sidewall fluid density foci.
Example at 2.9 x 1.7 cm on image 67 of series 2.

Fluid density within the left external iliac station of the pelvic
sidewall measures 5.1 x 3.3 cm on image 68 of series 2. No soft
tissue density components or gas within. Extension or adjacent fluid
is identified more superiorly on image 62.

Reproductive: Hysterectomy.  No adnexal mass.

Other: Edema is identified throughout the pelvis, greater on the
left.

Musculoskeletal: Degenerative disc disease that L3-4 and L4-5. Trace
L4-5 retrolisthesis which is likely degenerative. Mild disc bulges,
including at L4-5 and L5-S1.
IMPRESSION: 1. Status post hysterectomy. Pelvic edema which is greater than
typically seen 3 weeks postop. Suspicious for postoperative
infection.
2. Bladder wall thickening and irregularity. Although this could be
partially due to underdistention, Concurrent cystitis cannot be
excluded.
3. Bilateral fluid density lesions along the pelvic sidewalls.
Favored to represent seromas or lymphangiomas. No specific features
to suggest abscess. If the patient's symptoms persist after
appropriate antibiotic therapy, aspiration of the largest left
pelvic sidewall "Lesion" should be considered.
4. Suspicion of mild hepatic steatosis. Indeterminate right hepatic
lobe lesion. If definitive characterization is desired in this
patient with history of primary malignancy, nonemergent pre and post
contrast abdominal MRI could be performed.
5. Hiatal hernia.
These results will be called to the ordering clinician or
representative by the [HOSPITAL] at the imaging location.

## 2016-10-12 ENCOUNTER — Telehealth: Payer: Self-pay | Admitting: *Deleted

## 2016-10-12 NOTE — Telephone Encounter (Signed)
Patient called and schedueld a follow up appt due to pelvic pain. Appt moved for July 25th at 12pm; arrive at 11:30am

## 2016-10-17 ENCOUNTER — Ambulatory Visit: Payer: 59 | Attending: Gynecologic Oncology | Admitting: Gynecologic Oncology

## 2016-10-17 ENCOUNTER — Other Ambulatory Visit (HOSPITAL_BASED_OUTPATIENT_CLINIC_OR_DEPARTMENT_OTHER): Payer: 59

## 2016-10-17 ENCOUNTER — Encounter: Payer: Self-pay | Admitting: Gynecologic Oncology

## 2016-10-17 ENCOUNTER — Other Ambulatory Visit: Payer: Self-pay | Admitting: Gynecologic Oncology

## 2016-10-17 VITALS — BP 131/50 | HR 67 | Temp 98.7°F | Resp 18 | Ht 60.0 in | Wt 167.5 lb

## 2016-10-17 DIAGNOSIS — Z885 Allergy status to narcotic agent status: Secondary | ICD-10-CM | POA: Diagnosis not present

## 2016-10-17 DIAGNOSIS — Z79899 Other long term (current) drug therapy: Secondary | ICD-10-CM | POA: Diagnosis not present

## 2016-10-17 DIAGNOSIS — Z87891 Personal history of nicotine dependence: Secondary | ICD-10-CM | POA: Diagnosis not present

## 2016-10-17 DIAGNOSIS — Z8542 Personal history of malignant neoplasm of other parts of uterus: Secondary | ICD-10-CM | POA: Insufficient documentation

## 2016-10-17 DIAGNOSIS — Z90722 Acquired absence of ovaries, bilateral: Secondary | ICD-10-CM | POA: Diagnosis not present

## 2016-10-17 DIAGNOSIS — C541 Malignant neoplasm of endometrium: Secondary | ICD-10-CM

## 2016-10-17 DIAGNOSIS — F419 Anxiety disorder, unspecified: Secondary | ICD-10-CM | POA: Insufficient documentation

## 2016-10-17 DIAGNOSIS — Z08 Encounter for follow-up examination after completed treatment for malignant neoplasm: Secondary | ICD-10-CM | POA: Diagnosis present

## 2016-10-17 DIAGNOSIS — K219 Gastro-esophageal reflux disease without esophagitis: Secondary | ICD-10-CM | POA: Insufficient documentation

## 2016-10-17 DIAGNOSIS — E039 Hypothyroidism, unspecified: Secondary | ICD-10-CM | POA: Insufficient documentation

## 2016-10-17 DIAGNOSIS — Z90710 Acquired absence of both cervix and uterus: Secondary | ICD-10-CM | POA: Diagnosis not present

## 2016-10-17 DIAGNOSIS — F329 Major depressive disorder, single episode, unspecified: Secondary | ICD-10-CM | POA: Insufficient documentation

## 2016-10-17 DIAGNOSIS — R102 Pelvic and perineal pain: Secondary | ICD-10-CM | POA: Diagnosis not present

## 2016-10-17 DIAGNOSIS — M1990 Unspecified osteoarthritis, unspecified site: Secondary | ICD-10-CM | POA: Diagnosis not present

## 2016-10-17 DIAGNOSIS — R1909 Other intra-abdominal and pelvic swelling, mass and lump: Secondary | ICD-10-CM | POA: Diagnosis not present

## 2016-10-17 LAB — BUN AND CREATININE (CC13)
BUN: 18.7 mg/dL (ref 7.0–26.0)
CREATININE: 0.8 mg/dL (ref 0.6–1.1)
EGFR: 73 mL/min/{1.73_m2} — ABNORMAL LOW (ref >90)

## 2016-10-17 NOTE — Patient Instructions (Signed)
Plan to have a CT scan of the abdomen and pelvis and we will contact you with the results.  Follow up based on results.

## 2016-10-17 NOTE — Progress Notes (Signed)
GYN oncology follow-up visit. CC: left pelvic pain   ASSESSMENT AND PLAN:  66 year old woman with stage IA grade 1 endometrial cancer status post definitive surgery on 10/26/2014. She has no evidence of recurrent disease. I last saw her in March of last year which time her exam was unremarkable. She called complaining of pelvic discomfort and is scheduled to see [name] today.  There is a fullness towards the right side it does appear to be a little bit different an asymmetrical than her left. I believe this will be best assessed by CT scan. We'll check her creatinine today and get her scheduled for CT scan. She knows that we will contact her with the results and schedule follow-up around this.  HPI:  Ashley Villegas is a 66 y.o. year old No obstetric history on file. initially seen in consultation on 09/30/14, referred by Dr Nelda Marseille for grade 1 endometrial cancer.  She then underwent a robotic hysterectomy, BSO and bilateral pelvic node dissection on 03/30/95 without complications.  Her postoperative course was uncomplicated .  Her final pathologic diagnosis is a Stage IA Grade 1 endometrioid endometrial cancer with no  lymphovascular space invasion, 5/15 mm (33%) of myometrial invasion and negative lymph nodes. Postoperatively she developed bilateral symptomatic lymphocyst. There were drained and she was treated with antibiotics.  IMPRESSION: 1. Status post hysterectomy. Pelvic edema which is greater than typically seen 3 weeks postop. Suspicious for postoperative infection. 2. Bladder wall thickening and irregularity. Although this could be partially due to underdistention, Concurrent cystitis cannot be excluded. 3. Bilateral fluid density lesions along the pelvic sidewalls. Favored to represent seromas or lymphangiomas. No specific features to suggest abscess. If the patient's symptoms persist after appropriate antibiotic therapy, aspiration of the largest left pelvic sidewall "Lesion" should be  considered. 4. Suspicion of mild hepatic steatosis. Indeterminate right hepatic lobe lesion. If definitive characterization is desired in this patient with history of primary malignancy, nonemergent pre and post contrast abdominal MRI could be performed. 5. Hiatal hernia.  IMPRESSION (9/16): 1. Complete resolution of dominant fluid collection within the left hemipelvis following percutaneous drainage catheter placement. This percutaneous drainage catheter was subsequent removed intact at the patient's bedside. 2. Continued decrease in size of bilobed fluid collections within the right hemipelvis with superficial component measuring 1.6 cm and posterior component measuring 2.6 cm, indeterminate though both favored to represent evolving seromas. 3. Unchanged indeterminate approximately 1.2 cm hepatic lesion. 4. Colonic diverticulosis without evidence of diverticulitis.  Interval History: I last saw her in March 2017. She did not keep her March 2018 appointment and called stating that she was having pelvic pain he needed to come in. She did not see her physician Dr. Suanne Marker in between our 2 visits either until last week when she saw her on Wednesday complaining of pain. She states that her a few months ago she developed food poisoning after eating out and that's when this discomfort started. On the pain is primarily on the right is worse with sitting some days are better than others. It occasionally and rarely does wake her up at night but is mostly during the day. She does take Aleve fairly regularly and it does help relieve the pain she's also having back pain that radiates towards the anterior portion of her right leg. There is no leg weakness. There is no hematuria. She denies any vaginal bleeding. She really denies any central pelvic pain or other diffuse abdominal pains. There's been no change in her bowel or bladder  habits. She is up-to-date on her mammograms and is due for one in October. She is  herself not had any new medical issues or new surgeries. There are no new medical problems and her family.  Review of Systems  Constitutional:  Denies fever.  Cardiovascular: No chest pain, shortness of breath, or edema  Pulmonary: No cough Gastro Intestinal: No nausea, vomiting, constipation, or diarrhea reported. No change in bowel movement.  Genitourinary: Denies vaginal bleeding and discharge.  Psychology: No concerns  Current Outpatient Prescriptions on File Prior to Visit  Medication Sig Dispense Refill  . FLUoxetine (PROZAC) 40 MG capsule Take 40 mg by mouth daily.    . furosemide (LASIX) 40 MG tablet Take 40 mg by mouth daily as needed for fluid.     Marland Kitchen levothyroxine (SYNTHROID, LEVOTHROID) 88 MCG tablet Take 88 mcg by mouth daily before breakfast. Patient takes sat and sun     No current facility-administered medications on file prior to visit.    Allergies  Allergen Reactions  . Hydrocodone Rash   Past Medical History:  Diagnosis Date  . Anxiety   . Arthritis   . Cancer [facility])    endometrial cancer  . Complication of anesthesia   . Depression   . GERD (gastroesophageal reflux disease)   . Hypothyroidism    on synthroid   . PONV (postoperative nausea and vomiting)    Past Surgical History:  Procedure Laterality Date  . CESAREAN SECTION    . DIAGNOSTIC LAPAROSCOPY     fibroid tumors on her ovaries   . DILATION AND CURETTAGE OF UTERUS    . HYSTEROSCOPY W/D&C N/A 09/15/2014   Procedure: DILATATION AND CURETTAGE /HYSTEROSCOPY/MYOSURE RESECTION OF POLYP;  Surgeon: Janyth Pupa, DO;  Location: [facility];  Service: Gynecology;  Laterality: N/A;  . KNEE SURGERY    . LYMPH NODE DISSECTION N/A 10/26/2014   Procedure: POSS LYMPHADENECTOMY ;  Surgeon: [name], MD;  Location: [facility];  Service: Gynecology;  Laterality: N/A;  . ROBOTIC ASSISTED TOTAL HYSTERECTOMY WITH BILATERAL SALPINGO OOPHERECTOMY Bilateral 10/26/2014   Procedure: ROBOTIC ASSISTED TOTAL HYSTERECTOMY WITH  BILATERAL SALPINGO OOPHORECTOMY;  Surgeon: [name], MD;  Location: [facility];  Service: Gynecology;  Laterality: Bilateral;  . TUBAL LIGATION    . WISDOM TOOTH EXTRACTION     History reviewed. No pertinent family history. Social History   Social History  . Marital status: Single    Spouse name: N/A  . Number of children: N/A  . Years of education: N/A   Occupational History  . Not on file.   Social History Main Topics  . Smoking status: Former Smoker    Packs/day: 0.50    Years: 13.00    Types: Cigarettes    Quit date: 10/20/2007  . Smokeless tobacco: Never Used  . Alcohol use No  . Drug use: No  . Sexual activity: Not on file   Other Topics Concern  . Not on file   Social History Narrative  . No narrative on file   Physical Exam: Blood pressure (!) 131/50, pulse 67, temperature 98.7 F (37.1 C), temperature source Oral, resp. rate 18, height 5' (1.524 m), weight 167 lb 8 oz (76 kg), SpO2 98 %. General: Well dressed, well nourished in no apparent distress.    Neck: Supple, no lymphadenopathy, no thyromegaly.  Lungs: Clear to auscultation bilaterally.  Cardiovascular: Regular rate and rhythm.  Abdomen:  Soft, tender on right to deep palpation, nondistended.  No palpable masses.  No hepatosplenomegaly.  No  ascites. Normal bowel sounds.  No hernias.  Incisions are well healed. No rebound or guarding or masses.  Groins: No lymphadenopathy.  Extremities: No edema  Genitourinary: Normal female genitalia. Vagina is atrophic. The vaginal cuff is visualized. There are no visible lesions. There is no discharge or blood. Bimanual examination reveals no masses or nodularity. Tender on her right side, on rectal exam there is a fullness posteriorly towards the right that is more prominent than on the left.  [name] A., MD

## 2016-10-19 ENCOUNTER — Telehealth: Payer: Self-pay | Admitting: Gynecologic Oncology

## 2016-10-19 ENCOUNTER — Ambulatory Visit (HOSPITAL_COMMUNITY): Payer: 59

## 2016-10-19 ENCOUNTER — Encounter (HOSPITAL_COMMUNITY): Payer: Self-pay

## 2016-10-19 ENCOUNTER — Ambulatory Visit (HOSPITAL_COMMUNITY)
Admission: RE | Admit: 2016-10-19 | Discharge: 2016-10-19 | Disposition: A | Discharge status: Home / Self Care | Payer: 59 | Source: Ambulatory Visit | Attending: Gynecologic Oncology | Admitting: Gynecologic Oncology

## 2016-10-19 DIAGNOSIS — C541 Malignant neoplasm of endometrium: Secondary | ICD-10-CM | POA: Diagnosis not present

## 2016-10-19 DIAGNOSIS — K760 Fatty (change of) liver, not elsewhere classified: Secondary | ICD-10-CM | POA: Diagnosis not present

## 2016-10-19 DIAGNOSIS — K5730 Diverticulosis of large intestine without perforation or abscess without bleeding: Secondary | ICD-10-CM | POA: Insufficient documentation

## 2016-10-19 MED ORDER — IOPAMIDOL (ISOVUE-300) INJECTION 61%
100.0000 mL | Freq: Once | INTRAVENOUS | Status: AC | PRN
  Administered 2016-10-19: 100 mL via INTRAVENOUS

## 2016-10-19 MED ORDER — IOPAMIDOL (ISOVUE-300) INJECTION 61%
INTRAVENOUS | Status: AC
  Filled 2016-10-19: qty 100

## 2016-10-19 NOTE — Telephone Encounter (Signed)
TC x4 to patient. The calls will not go through to the listed number. [name]

## 2016-10-22 ENCOUNTER — Telehealth: Payer: Self-pay | Admitting: Gynecologic Oncology

## 2016-10-22 NOTE — Telephone Encounter (Signed)
Called and spoke with patient about CT scan results.  Advised that Dr. Alycia Rossetti would like to see her in the office on Wed for a biopsy.  Patient stating they are not allowed to take off from work in August since it is their busy time.  Patient is going to call and speak with her boss and call back to arrange appt.

## 2016-10-23 NOTE — Progress Notes (Signed)
GYN oncology follow-up visit. CC: left pelvic pain   ASSESSMENT AND PLAN:  66 year old woman with stage IA grade 1 endometrial cancer status post definitive surgery on 10/26/2014. I had not seen her in approximately 15 months and she comes in last week with complaints of pain and on CT scan had findings worrisome for confirmation of recurrent disease. Her exam when I saw her did have a fullness in the right side. Biopsy was performed today. As soon as the vaginal mucosa was pierced with biopsy forcep was obvious tumor that was beneath the vaginal mucosa. Will be sent for pathology to confirm the diagnosis.  I discussed with the patient that we'll need to proceed with external beam radiation and that that will take about 5-6 weeks and that she may or may not need vaginal cuff brachii therapy as well.  Her questions were elicited in answer to her satisfaction. A referral has been placed for radiation oncology and I will contact her with the results of the biopsy.  I spoke with her and told that I did try to reach her last week. She states that her phone was out of order but now is working again and I should be able to call her with the results.   HPI:  Ashley Villegas is a 66 y.o. year old No obstetric history on file. initially seen in consultation on 09/30/14, referred by Dr Nelda Marseille for grade 1 endometrial cancer.  She then underwent a robotic hysterectomy, BSO and bilateral pelvic node dissection on 03/31/08 without complications.  Her postoperative course was uncomplicated .  Her final pathologic diagnosis is a Stage IA Grade 1 endometrioid endometrial cancer with no  lymphovascular space invasion, 5/15 mm (33%) of myometrial invasion and negative lymph nodes. Postoperatively she developed bilateral symptomatic lymphocyst. There were drained and she was treated with antibiotics.  IMPRESSION: 1. Status post hysterectomy. Pelvic edema which is greater than typically seen 3 weeks postop. Suspicious  for postoperative infection. 2. Bladder wall thickening and irregularity. Although this could be partially due to underdistention, Concurrent cystitis cannot be excluded. 3. Bilateral fluid density lesions along the pelvic sidewalls. Favored to represent seromas or lymphangiomas. No specific features to suggest abscess. If the patient's symptoms persist after appropriate antibiotic therapy, aspiration of the largest left pelvic sidewall "Lesion" should be considered. 4. Suspicion of mild hepatic steatosis. Indeterminate right hepatic lobe lesion. If definitive characterization is desired in this patient with history of primary malignancy, nonemergent pre and post contrast abdominal MRI could be performed. 5. Hiatal hernia.  IMPRESSION (9/16): 1. Complete resolution of dominant fluid collection within the left hemipelvis following percutaneous drainage catheter placement. This percutaneous drainage catheter was subsequent removed intact at the patient's bedside. 2. Continued decrease in size of bilobed fluid collections within the right hemipelvis with superficial component measuring 1.6 cm and posterior component measuring 2.6 cm, indeterminate though both favored to represent evolving seromas. 3. Unchanged indeterminate approximately 1.2 cm hepatic lesion. 4. Colonic diverticulosis without evidence of diverticulitis.  Interval History: I last saw her in March 2017. She did not keep her March 2018 appointment and called stating that she was having pelvic pain and she needed to come in. She did not see her physician Dr. Suanne Marker in between our 2 visits either until last week when she saw her on Wednesday complaining of pain. She states that her a few months ago she developed food poisoning after eating out and that's when this discomfort started. On the pain is primarily  on the right is worse with sitting some days are better than others. It occasionally and rarely does wake her up at night but is mostly  during the day.   I saw her last week at which time she was tender and I felt a fullness on RV exam towards her right side. A CT scan was obtained that revealed: IMPRESSION: New 2.6 cm soft tissue mass involving right vaginal cuff, consistent with locally recurrent carcinoma.  No other sites of metastatic disease identified.  Colonic diverticulosis, without radiographic evidence of diverticulitis.  Mild hepatic steatosis.    Current Outpatient Prescriptions on File Prior to Visit  Medication Sig Dispense Refill  . FLUoxetine (PROZAC) 40 MG capsule Take 40 mg by mouth daily.    . furosemide (LASIX) 40 MG tablet Take 40 mg by mouth daily as needed for fluid.     Marland Kitchen levothyroxine (SYNTHROID, LEVOTHROID) 88 MCG tablet Take 88 mcg by mouth daily before breakfast. Patient takes sat and sun     No current facility-administered medications on file prior to visit.    Allergies  Allergen Reactions  . Hydrocodone Rash   Past Medical History:  Diagnosis Date  . Anxiety   . Arthritis   . Cancer [facility])    endometrial cancer  . Complication of anesthesia   . Depression   . GERD (gastroesophageal reflux disease)   . Hypothyroidism    on synthroid   . PONV (postoperative nausea and vomiting)    Past Surgical History:  Procedure Laterality Date  . CESAREAN SECTION    . DIAGNOSTIC LAPAROSCOPY     fibroid tumors on her ovaries   . DILATION AND CURETTAGE OF UTERUS    . HYSTEROSCOPY W/D&C N/A 09/15/2014   Procedure: DILATATION AND CURETTAGE /HYSTEROSCOPY/MYOSURE RESECTION OF POLYP;  Surgeon: Janyth Pupa, DO;  Location: [facility];  Service: Gynecology;  Laterality: N/A;  . KNEE SURGERY    . LYMPH NODE DISSECTION N/A 10/26/2014   Procedure: POSS LYMPHADENECTOMY ;  Surgeon: [name], MD;  Location: [facility];  Service: Gynecology;  Laterality: N/A;  . ROBOTIC ASSISTED TOTAL HYSTERECTOMY WITH BILATERAL SALPINGO OOPHERECTOMY Bilateral 10/26/2014   Procedure: ROBOTIC ASSISTED TOTAL HYSTERECTOMY  WITH BILATERAL SALPINGO OOPHORECTOMY;  Surgeon: [name], MD;  Location: [facility];  Service: Gynecology;  Laterality: Bilateral;  . TUBAL LIGATION    . WISDOM TOOTH EXTRACTION     History reviewed. No pertinent family history. Social History   Social History  . Marital status: Single    Spouse name: N/A  . Number of children: N/A  . Years of education: N/A   Occupational History  . Not on file.   Social History Main Topics  . Smoking status: Former Smoker    Packs/day: 0.50    Years: 13.00    Types: Cigarettes    Quit date: 10/20/2007  . Smokeless tobacco: Never Used  . Alcohol use No  . Drug use: No  . Sexual activity: Not on file   Other Topics Concern  . Not on file   Social History Narrative  . No narrative on file   Physical Exam: Blood pressure (!) 114/49, pulse 70, temperature 98.2 F (36.8 C), temperature source Oral, resp. rate 18, height 5' (1.524 m), weight 168 lb 3.2 oz (76.3 kg), SpO2 98 %. General: Well dressed, well nourished in no apparent distress.    Genitourinary: Normal female genitalia. Vagina is atrophic. The vaginal cuff is visualized. There are no visible lesions. There is no discharge or  blood. Using the knowledge and my physical exam findings from last week as well as CT results, a vaginal biopsy in the right upper apex of the vagina was performed the Tischler biopsy forcep. As soon as the vaginal mucosa was pierced with the biopsy forceps it was obvious tumor. Hemostasis was obtained with silver nitrate. [name] A., MD

## 2016-10-24 ENCOUNTER — Encounter: Payer: Self-pay | Admitting: Gynecologic Oncology

## 2016-10-24 ENCOUNTER — Encounter: Payer: Self-pay | Admitting: Radiation Oncology

## 2016-10-24 ENCOUNTER — Other Ambulatory Visit: Payer: Self-pay | Admitting: Gynecologic Oncology

## 2016-10-24 ENCOUNTER — Ambulatory Visit: Payer: 59 | Attending: Gynecologic Oncology | Admitting: Gynecologic Oncology

## 2016-10-24 VITALS — BP 114/49 | HR 70 | Temp 98.2°F | Resp 18 | Ht 60.0 in | Wt 168.2 lb

## 2016-10-24 DIAGNOSIS — Z885 Allergy status to narcotic agent status: Secondary | ICD-10-CM | POA: Insufficient documentation

## 2016-10-24 DIAGNOSIS — K219 Gastro-esophageal reflux disease without esophagitis: Secondary | ICD-10-CM | POA: Diagnosis not present

## 2016-10-24 DIAGNOSIS — C541 Malignant neoplasm of endometrium: Secondary | ICD-10-CM

## 2016-10-24 DIAGNOSIS — Z87891 Personal history of nicotine dependence: Secondary | ICD-10-CM | POA: Diagnosis not present

## 2016-10-24 DIAGNOSIS — F329 Major depressive disorder, single episode, unspecified: Secondary | ICD-10-CM | POA: Diagnosis not present

## 2016-10-24 DIAGNOSIS — E039 Hypothyroidism, unspecified: Secondary | ICD-10-CM | POA: Insufficient documentation

## 2016-10-24 DIAGNOSIS — Z90710 Acquired absence of both cervix and uterus: Secondary | ICD-10-CM | POA: Insufficient documentation

## 2016-10-24 DIAGNOSIS — Z90722 Acquired absence of ovaries, bilateral: Secondary | ICD-10-CM | POA: Insufficient documentation

## 2016-10-24 DIAGNOSIS — F419 Anxiety disorder, unspecified: Secondary | ICD-10-CM | POA: Insufficient documentation

## 2016-10-24 DIAGNOSIS — R102 Pelvic and perineal pain: Secondary | ICD-10-CM | POA: Diagnosis not present

## 2016-10-24 DIAGNOSIS — M1990 Unspecified osteoarthritis, unspecified site: Secondary | ICD-10-CM | POA: Diagnosis not present

## 2016-10-24 DIAGNOSIS — Z79899 Other long term (current) drug therapy: Secondary | ICD-10-CM | POA: Diagnosis not present

## 2016-10-24 NOTE — Patient Instructions (Addendum)
We will call you with the results of your biopsy from today.  We will also make an appointment for you to meet with Dr. Sondra Come, Radiation Oncology.  You can take up to 600 mg of ibuprofen every six hours as needed for pain.  Appointment with Dr. Sondra Come on August 6 at 7:30am here at the [facility].

## 2016-10-25 NOTE — Progress Notes (Signed)
GYN Location of Tumor / Histology: endometrial cancer  Devoria Albe presented with symptoms of: pelvic pain and vaginal bleeding  Biopsies revealed:   10/24/16 Diagnosis Vagina, biopsy, right apex - ADENOCARCINOMA  Past/Anticipated interventions by Gyn/Onc surgery, if any: 10/26/14 - Procedure: ROBOTIC ASSISTED TOTAL HYSTERECTOMY WITH BILATERAL SALPINGO OOPHORECTOMY;  Surgeon: [name], MD  Past/Anticipated interventions by medical oncology, if any: no  Weight changes, if any: no but does have a poor appetite.  Bowel/Bladder complaints, if any: no  Nausea/Vomiting, if any: no  Pain issues, if any:  Yes has right pelvic pain - she takes Aleve twice a day.  SAFETY ISSUES:  Prior radiation? Left neck when she was a baby for an abscess from mumps.  Pacemaker/ICD? no  Possible current pregnancy? no  Is the patient on methotrexate? no  Current Complaints / other details:  Dr. Alycia Rossetti is recommending external beam radiation and possible vaginal brachytherapy.  Patient is here with her grandson.  BP 138/83 (BP Location: Left Arm, Patient Position: Sitting)   Pulse 64   Temp 98.3 F (36.8 C) (Oral)   Ht 5' 0.75" (1.543 m)   Wt 167 lb 12.8 oz (76.1 kg)   SpO2 98%   BMI 31.97 kg/m    Wt Readings from Last 3 Encounters:  10/29/16 167 lb 12.8 oz (76.1 kg)  10/24/16 168 lb 3.2 oz (76.3 kg)  10/17/16 167 lb 8 oz (76 kg)

## 2016-10-26 ENCOUNTER — Telehealth: Payer: Self-pay | Admitting: Gynecologic Oncology

## 2016-10-26 NOTE — Telephone Encounter (Signed)
Informed patient of biopsy results.  All questions answered.  Advised to call for any needs.  Appt with Dr. Sondra Come in Radiation on Monday.

## 2016-10-29 ENCOUNTER — Ambulatory Visit
Admission: RE | Admit: 2016-10-29 | Discharge: 2016-10-29 | Disposition: A | Discharge status: Home / Self Care | Payer: 59 | Source: Ambulatory Visit | Attending: Radiation Oncology | Admitting: Radiation Oncology

## 2016-10-29 ENCOUNTER — Encounter: Payer: Self-pay | Admitting: Radiation Oncology

## 2016-10-29 VITALS — BP 138/83 | HR 64 | Temp 98.3°F | Ht 60.75 in | Wt 167.8 lb

## 2016-10-29 DIAGNOSIS — Z90710 Acquired absence of both cervix and uterus: Secondary | ICD-10-CM | POA: Insufficient documentation

## 2016-10-29 DIAGNOSIS — C541 Malignant neoplasm of endometrium: Secondary | ICD-10-CM | POA: Insufficient documentation

## 2016-10-29 DIAGNOSIS — Z923 Personal history of irradiation: Secondary | ICD-10-CM | POA: Insufficient documentation

## 2016-10-29 DIAGNOSIS — R102 Pelvic and perineal pain: Secondary | ICD-10-CM | POA: Insufficient documentation

## 2016-10-29 DIAGNOSIS — F329 Major depressive disorder, single episode, unspecified: Secondary | ICD-10-CM | POA: Insufficient documentation

## 2016-10-29 DIAGNOSIS — Z885 Allergy status to narcotic agent status: Secondary | ICD-10-CM | POA: Insufficient documentation

## 2016-10-29 DIAGNOSIS — M1990 Unspecified osteoarthritis, unspecified site: Secondary | ICD-10-CM | POA: Insufficient documentation

## 2016-10-29 DIAGNOSIS — F419 Anxiety disorder, unspecified: Secondary | ICD-10-CM | POA: Insufficient documentation

## 2016-10-29 DIAGNOSIS — Z90722 Acquired absence of ovaries, bilateral: Secondary | ICD-10-CM | POA: Insufficient documentation

## 2016-10-29 DIAGNOSIS — Z510 Encounter for antineoplastic radiation therapy: Secondary | ICD-10-CM | POA: Insufficient documentation

## 2016-10-29 DIAGNOSIS — Z87891 Personal history of nicotine dependence: Secondary | ICD-10-CM | POA: Insufficient documentation

## 2016-10-29 DIAGNOSIS — E039 Hypothyroidism, unspecified: Secondary | ICD-10-CM | POA: Insufficient documentation

## 2016-10-29 DIAGNOSIS — Z79899 Other long term (current) drug therapy: Secondary | ICD-10-CM | POA: Insufficient documentation

## 2016-10-29 DIAGNOSIS — K219 Gastro-esophageal reflux disease without esophagitis: Secondary | ICD-10-CM | POA: Insufficient documentation

## 2016-10-29 NOTE — Progress Notes (Signed)
Please see the Nurse Progress Note in the MD Initial Consult Encounter for this patient. 

## 2016-10-29 NOTE — Progress Notes (Signed)
Has armband been applied?  Yes  Does patient have an allergy to IV contrast dye?: No.   Has patient ever received premedication for IV contrast dye?: No.   Does patient take metformin?: No.  If patient does take metformin when was the last dose: n/a  Date of lab work: October 17, 2016 BUN: 18.7 CR: 0.8  IV site: Right AC  Has IV site been added to flowsheet?  Yes

## 2016-10-29 NOTE — Progress Notes (Signed)
Radiation Oncology         [phone] ________________________________  Initial Outpatient Consultation  Name: Ashley Villegas MRN: [id number]  Date: 10/29/2016  DOB: [birth year]  VP:[id number], Jenny Reichmann, MD  [name], MD   REFERRING PHYSICIAN: [name], MD  DIAGNOSIS: 66 y.o. with recurrent endometrial cancer, stage IA grade 1, status post hysterectomy  HISTORY OF PRESENT ILLNESS::Ashley Villegas is a 66 y.o. female who presented to her gynecologist, Dr. Alycia Rossetti, on 10/24/2016 with symptoms of pelvic pain and postmenopausal vaginal bleeding.  She was initially seen by Dr. Alycia Rossetti on 09/30/2014 for grade 1 endometrial cancer.  She then underwent a robotic hysterectomy, BSO and bilateral pelvic node dissection on 48/54/6270 without complications.  Her postoperative course was uncomplicated.  Her final pathologic diagnosis is a Stage IA Grade 1 endometrioid endometrial cancer with no lymphovascular space invasion, 5/15 mm (33%) of myometrial invasion and negative lymph nodes.  Postoperatively she developed bilateral symptomatic lymphocyst.  These were drained and she was treated with antibiotics.  CT of the abdomen and pelvis on 10/19/2016 showed a new 2.6 cm soft tissue mass involving right vaginal cuff, consistent with locally recurrent carcinoma.  Dr. 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  The patient was referred today to discuss potential radiation therapy options. She is accompanied by her grandson. The patient has complaints of right pelvic pain that radiates down her leg.  She says this is relieved by Aleve twice a day.  Of note, the patient reports that her maternal aunt had breast cancer that occurred in her 89s.  ONCOLOGICAL HISTORY:   Endometrial cancer [facility])    Initial Diagnosis    Endometrial cancer      10/26/2014 Surgery    TRH/BSO bilateral pelvic LND. IA1 endometrial cancer, close follow up.        PREVIOUS RADIATION THERAPY: Yes - To her left neck for an abscess from mumps when she was a baby.  PAST MEDICAL HISTORY:  has a past medical history of Anxiety; Arthritis; Cancer ([place]); Complication of anesthesia; Depression; GERD (gastroesophageal reflux disease); Hypothyroidism; and PONV (postoperative nausea and vomiting).    PAST SURGICAL HISTORY: Past Surgical History:  Procedure Laterality Date  . CESAREAN SECTION    . DIAGNOSTIC LAPAROSCOPY     fibroid tumors on her ovaries   . DILATION AND CURETTAGE OF UTERUS    . HYSTEROSCOPY W/D&C N/A 09/15/2014   Procedure: DILATATION AND CURETTAGE /HYSTEROSCOPY/MYOSURE RESECTION OF POLYP;  Surgeon: Janyth Pupa, DO;  Location: [facility];  Service: Gynecology;  Laterality: N/A;  . KNEE SURGERY    . LYMPH NODE DISSECTION N/A 10/26/2014   Procedure: POSS LYMPHADENECTOMY ;  Surgeon: [name], MD;  Location: [facility];  Service: Gynecology;  Laterality: N/A;  . ROBOTIC ASSISTED TOTAL HYSTERECTOMY WITH BILATERAL SALPINGO OOPHERECTOMY Bilateral 10/26/2014   Procedure: ROBOTIC ASSISTED TOTAL HYSTERECTOMY WITH BILATERAL SALPINGO OOPHORECTOMY;  Surgeon: [name], MD;  Location: [facility];  Service: Gynecology;  Laterality: Bilateral;  . TUBAL LIGATION    . WISDOM TOOTH EXTRACTION      FAMILY HISTORY: family history includes Breast cancer in her maternal aunt.  SOCIAL HISTORY:  reports that she quit smoking about 9 years ago. Her smoking use included Cigarettes. She has a 6.50 pack-year smoking history. She has never used smokeless tobacco. She reports that she does not drink alcohol or use drugs.  ALLERGIES: Hydrocodone  MEDICATIONS:  Current Outpatient Prescriptions  Medication Sig Dispense Refill  . FLUoxetine (  PROZAC) 40 MG capsule Take 40 mg by mouth daily.    . furosemide (LASIX) 40 MG tablet Take 40 mg by mouth daily as needed for fluid.     Marland Kitchen levothyroxine (SYNTHROID, LEVOTHROID) 88 MCG tablet Take 88 mcg by mouth daily before breakfast.  Patient takes sat and sun     No current facility-administered medications for this encounter.     REVIEW OF SYSTEMS:  A 10+ POINT REVIEW OF SYSTEMS WAS OBTAINED including neurology, dermatology, psychiatry, cardiac, respiratory, lymph, extremities, GI, GU, musculoskeletal, constitutional, reproductive, HEENT. All pertinent positives are noted in the HPI. All others are negative.   PHYSICAL EXAM:  height is 5' 0.75" (1.543 m) and weight is 167 lb 12.8 oz (76.1 kg). Her oral temperature is 98.3 F (36.8 C). Her blood pressure is 138/83 and her pulse is 64. Her oxygen saturation is 98%.   GENERAL: Alert and oriented, in no acute distress HEENT: Head is normocephalic. Extraocular movements are intact. Oropharynx is clear with dentures along the maxillary region. Neck: Neck is supple, no palpable cervical or supraclavicular lymphadenopathy. Heart: Regular in rate and rhythm with no murmurs, rubs, or gallops. Chest: Clear to auscultation bilaterally, with no rhonchi, wheezes, or rales. Pelvis: On pelvic examination the external genitalia were unremarkable. A speculum exam was performed. There are no mucosal lesions noted in the vaginal vault. Biopsy site noted in right vaginal apex area. On bimanual and rectovaginal examination the patient has a palpable mass in the right vaginal apex estimated to be 2.5 x 3 cm in size. The lesion is barely reachable by bimanual rectal exam. Extremities: No cyanosis or edema.  Lymphatics: See Neck Exam Skin: No concerning lesions. Musculoskeletal: Symmetric strength and muscle tone throughout. Neurologic: Cranial nerves II through XII are grossly intact. No obvious focalities. Speech is fluent. Coordination is intact. Psychiatric: Judgment and insight are intact. Affect is appropriate.   ECOG = 1 1 - Symptomatic but completely ambulatory (Restricted in physically strenuous activity but ambulatory and able to carry out work of a light or sedentary nature. For  example, light housework, office work)  [employer] MM, Creech RH, Tormey DC, et al. [phone]). "Toxicity and response criteria of the [facility]". [place] Oncol. 5 (6): 649-55  LABORATORY DATA:  Lab Results  Component Value Date   WBC 11.7 (H) 11/25/2014   HGB 13.0 11/25/2014   HCT 38.4 11/25/2014   MCV 91.9 11/25/2014   PLT 445 (H) 11/25/2014   NEUTROABS 3.8 10/20/2014   Lab Results  Component Value Date   NA 139 10/27/2014   K 4.5 10/27/2014   CL 107 10/27/2014   CO2 25 10/27/2014   GLUCOSE 174 (H) 10/27/2014   CREATININE 0.8 10/17/2016   CALCIUM 8.8 (L) 10/27/2014      RADIOGRAPHY: Ct Abdomen Pelvis W Contrast  Result Date: 10/19/2016 CLINICAL DATA:  Right-sided abdominal and pelvic pain for 4 days. Personal history of endometrial carcinoma. EXAM: CT ABDOMEN AND PELVIS WITH CONTRAST TECHNIQUE: Multidetector CT imaging of the abdomen and pelvis was performed using the standard protocol following bolus administration of intravenous contrast. CONTRAST:  137mL ISOVUE-300 IOPAMIDOL (ISOVUE-300) INJECTION 61% COMPARISON:  12/08/2014 FINDINGS: Lower Chest: No acute findings. Hepatobiliary: Stable mild hepatic steatosis. Stable 11 mm low-attenuation lesion in posterior right hepatic lobe, consistent with benign etiology most likely representing a small cyst. No other liver lesions are identified. Gallbladder is unremarkable. Pancreas:  No mass or inflammatory changes. Spleen: Within normal limits in size and appearance.  Adrenals/Urinary Tract: No masses identified. No evidence of hydronephrosis. Stomach/Bowel: No evidence of obstruction, inflammatory process or abnormal fluid collections. Colonic diverticulosis, without radiographic evidence of diverticulitis. Vascular/Lymphatic: No pathologically enlarged lymph nodes. No abdominal aortic aneurysm. Aortic atherosclerosis. Reproductive: Prior hysterectomy. New soft tissue mass involving the right vaginal cuff which measures  2.6 x 2.5 cm common is consistent with vaginal cuff recurrence of endometrial carcinoma. No evidence of adnexal mass or free fluid. Other:  None. Musculoskeletal:  No suspicious bone lesions identified. IMPRESSION: New 2.6 cm soft tissue mass involving right vaginal cuff, consistent with locally recurrent carcinoma. No other sites of metastatic disease identified. Colonic diverticulosis, without radiographic evidence of diverticulitis. Mild hepatic steatosis. Electronically Signed   By: Earle Gell M.D.   On: 10/19/2016 10:16      IMPRESSION: Ashley Villegas is a 66 y.o. woman with recurrent endometrial cancer, stage IA grade 1, status post hysterectomy. She appears to have an isolated recurrence in the right vaginal cuff region, primarily located outside the vaginal vault. The patient would be a good candidate for definitive course of radiation therapy. I would recommend external beam inititally to 45 Gy in 25 fractions. The patient will then be re-examined to determine if she would be a candidate for external beam boost or vaginal brachytherapy boost with capri mutlichannel interstital applicator. We discussed the risks, benefits, short, and long term effects of radiotherapy, as well as the delivery and logistics. The patient agrees and is interested in proceeding. She signed the radiation oncology consent form, and a copy was retained for our records. I reviewed the patient's current biopsy results with pathology. This lesion appears to be low-grade. I also discussed treatment management with Dr. Fermin Schwab. He does not recommend radiosensitizing chemotherapy since her lesion was low-grade and not an extensive recurrence.   PLAN: We will see her back tomorrow 10/30/2016 at 1:00 PM to move forward with the simulation and planning process and anticipate starting radiotherapy in one week. She is inquiring about a genetics referral and she will be set up with outpatient counseling to see if she  would be a candidate for genetic testing.  The patient's mother did have a hysterectomy but the patient is unsure whether her mother had a malignancy.     ------------------------------------------------  Blair Promise, PhD, MD  This document serves as a record of services personally performed by Gery Pray, MD. It was created on his behalf by Rae Lips, a trained medical scribe. The creation of this record is based on the scribe's personal observations and the provider's statements to them. This document has been checked and approved by the attending provider.

## 2016-10-30 ENCOUNTER — Ambulatory Visit
Admission: RE | Admit: 2016-10-30 | Discharge: 2016-10-30 | Disposition: A | Discharge status: Home / Self Care | Payer: 59 | Source: Ambulatory Visit | Attending: Radiation Oncology | Admitting: Radiation Oncology

## 2016-10-30 DIAGNOSIS — E039 Hypothyroidism, unspecified: Secondary | ICD-10-CM | POA: Diagnosis not present

## 2016-10-30 DIAGNOSIS — Z923 Personal history of irradiation: Secondary | ICD-10-CM | POA: Diagnosis not present

## 2016-10-30 DIAGNOSIS — C541 Malignant neoplasm of endometrium: Secondary | ICD-10-CM

## 2016-10-30 DIAGNOSIS — Z90710 Acquired absence of both cervix and uterus: Secondary | ICD-10-CM | POA: Diagnosis not present

## 2016-10-30 DIAGNOSIS — Z510 Encounter for antineoplastic radiation therapy: Secondary | ICD-10-CM | POA: Diagnosis not present

## 2016-10-30 DIAGNOSIS — F329 Major depressive disorder, single episode, unspecified: Secondary | ICD-10-CM | POA: Diagnosis not present

## 2016-10-30 DIAGNOSIS — Z90722 Acquired absence of ovaries, bilateral: Secondary | ICD-10-CM | POA: Diagnosis not present

## 2016-10-30 DIAGNOSIS — K219 Gastro-esophageal reflux disease without esophagitis: Secondary | ICD-10-CM | POA: Diagnosis not present

## 2016-10-30 DIAGNOSIS — Z885 Allergy status to narcotic agent status: Secondary | ICD-10-CM | POA: Diagnosis not present

## 2016-10-30 DIAGNOSIS — M1990 Unspecified osteoarthritis, unspecified site: Secondary | ICD-10-CM | POA: Diagnosis not present

## 2016-10-30 DIAGNOSIS — Z87891 Personal history of nicotine dependence: Secondary | ICD-10-CM | POA: Diagnosis not present

## 2016-10-30 DIAGNOSIS — R102 Pelvic and perineal pain: Secondary | ICD-10-CM | POA: Diagnosis not present

## 2016-10-30 DIAGNOSIS — Z79899 Other long term (current) drug therapy: Secondary | ICD-10-CM | POA: Diagnosis not present

## 2016-10-30 DIAGNOSIS — F419 Anxiety disorder, unspecified: Secondary | ICD-10-CM | POA: Diagnosis not present

## 2016-10-30 MED ORDER — SODIUM CHLORIDE 0.9% FLUSH
10.0000 mL | Freq: Once | INTRAVENOUS | Status: AC
  Administered 2016-10-30: 10 mL via INTRAVENOUS

## 2016-10-31 NOTE — Progress Notes (Signed)
  Radiation Oncology         [phone] ________________________________  Name: Ashley Villegas MRN: [id number]  Date: 10/30/2016  DOB: 1950/12/03  SIMULATION AND TREATMENT PLANNING NOTE    ICD-10-CM   1. Endometrial cancer ([place]) C54.1     DIAGNOSIS:   66 y.o. with recurrent endometrial cancer, stage IA grade 1, status post hysterectomy  NARRATIVE:  The patient was brought to the [facility].  Identity was confirmed.  All relevant records and images related to the planned course of therapy were reviewed.  The patient freely provided informed written consent to proceed with treatment after reviewing the details related to the planned course of therapy. The consent form was witnessed and verified by the simulation staff.  Then, the patient was set-up in a stable reproducible  supine position for radiation therapy.  CT images were obtained.  Surface markings were placed.  The CT images were loaded into the planning software.  Then the target and avoidance structures were contoured.  Treatment planning then occurred.  The radiation prescription was entered and confirmed.  Then, I designed and supervised the construction of a total of 2 medically necessary complex treatment devices.  I have requested : Intensity Modulated Radiotherapy (IMRT) is medically necessary for this case for the following reason:  Small bowel sparing..  I have ordered:dose calc.  PLAN:  The patient will receive 45 Gy in 25 fractions. Patient will then be reexamined to determine if she will receive external beam boost  directed at the site of recurrence or proceed with a multi channel vaginal brachytherapy boost using the Capri-applicator with iridium 192 as the high-dose-rate source.  -----------------------------------  Blair Promise, PhD, MD

## 2016-10-31 NOTE — Addendum Note (Signed)
Encounter addended by: Gery Pray, MD on: 10/31/2016  8:46 PM<BR>    Actions taken: Problem List reviewed, Sign clinical note

## 2016-11-02 ENCOUNTER — Telehealth: Payer: Self-pay | Admitting: Gynecologic Oncology

## 2016-11-02 NOTE — Telephone Encounter (Signed)
Scheduled appt per sch message from 8/10 - patient aware - appt was added - asked for letter to be sent.

## 2016-11-06 ENCOUNTER — Telehealth: Payer: Self-pay | Admitting: Oncology

## 2016-11-06 DIAGNOSIS — Z510 Encounter for antineoplastic radiation therapy: Secondary | ICD-10-CM | POA: Diagnosis not present

## 2016-11-06 NOTE — Telephone Encounter (Signed)
Patient left a message saying that one of her marker stickers fell off.  She starts treatment on Thursday.  Called her back and advised her that the sticker will be fixed when she comes in on Thursday.  She verbalized agreement.

## 2016-11-07 ENCOUNTER — Ambulatory Visit: Payer: 59 | Admitting: Radiation Oncology

## 2016-11-08 ENCOUNTER — Ambulatory Visit
Admission: RE | Admit: 2016-11-08 | Discharge: 2016-11-08 | Disposition: A | Discharge status: Home / Self Care | Payer: 59 | Source: Ambulatory Visit | Attending: Radiation Oncology | Admitting: Radiation Oncology

## 2016-11-08 DIAGNOSIS — Z510 Encounter for antineoplastic radiation therapy: Secondary | ICD-10-CM | POA: Diagnosis not present

## 2016-11-09 ENCOUNTER — Ambulatory Visit
Admission: RE | Admit: 2016-11-09 | Discharge: 2016-11-09 | Disposition: A | Discharge status: Home / Self Care | Payer: 59 | Source: Ambulatory Visit | Attending: Radiation Oncology | Admitting: Radiation Oncology

## 2016-11-09 ENCOUNTER — Encounter: Payer: Self-pay | Admitting: *Deleted

## 2016-11-09 DIAGNOSIS — Z510 Encounter for antineoplastic radiation therapy: Secondary | ICD-10-CM | POA: Diagnosis not present

## 2016-11-09 NOTE — Progress Notes (Signed)
On 11-09-16 got paper work to be fill out from [employer], gave to nursing on 11-12-16

## 2016-11-12 ENCOUNTER — Ambulatory Visit (HOSPITAL_BASED_OUTPATIENT_CLINIC_OR_DEPARTMENT_OTHER): Payer: 59 | Admitting: Genetics

## 2016-11-12 ENCOUNTER — Encounter: Payer: Self-pay | Admitting: Genetics

## 2016-11-12 ENCOUNTER — Ambulatory Visit
Admission: RE | Admit: 2016-11-12 | Discharge: 2016-11-12 | Disposition: A | Discharge status: Home / Self Care | Payer: 59 | Source: Ambulatory Visit | Attending: Radiation Oncology | Admitting: Radiation Oncology

## 2016-11-12 ENCOUNTER — Telehealth: Payer: Self-pay | Admitting: Genetics

## 2016-11-12 ENCOUNTER — Other Ambulatory Visit: Payer: 59

## 2016-11-12 DIAGNOSIS — Z8049 Family history of malignant neoplasm of other genital organs: Secondary | ICD-10-CM | POA: Insufficient documentation

## 2016-11-12 DIAGNOSIS — Z7183 Encounter for nonprocreative genetic counseling: Secondary | ICD-10-CM | POA: Diagnosis not present

## 2016-11-12 DIAGNOSIS — C541 Malignant neoplasm of endometrium: Secondary | ICD-10-CM

## 2016-11-12 DIAGNOSIS — Z510 Encounter for antineoplastic radiation therapy: Secondary | ICD-10-CM | POA: Diagnosis not present

## 2016-11-12 NOTE — Progress Notes (Addendum)
REFERRING PROVIDER: Gery Pray, MD [place], [place] [address]  PRIMARY PROVIDER:  [name], MD  PRIMARY REASON FOR VISIT:  1. Endometrial cancer ([place])   2. Family history of uterine cancer     HISTORY OF PRESENT ILLNESS:   Ashley Villegas, a 66 y.o. female, was seen for a [place] cancer genetics consultation at the request of Dr. Sondra Come due to a personal and family history of cancer.  Ashley Villegas presents to clinic today to discuss the possibility of a hereditary predisposition to cancer, genetic testing, and to further clarify her future cancer risks, as well as potential cancer risks for family members.   In 2016, at the age of 13, Ashley Villegas was diagnosed with stage1A endometrial cancer. This was treated with hysterectomy + BSO in Aug 2016.  Recently her endometrial cancer has recurred, and she is receiving radiation. Marland Kitchen   CANCER HISTORY:    Endometrial cancer [facility])    Initial Diagnosis    Endometrial cancer      10/26/2014 Surgery    TRH/BSO bilateral pelvic LND. IA1 endometrial cancer, close follow up.       HORMONAL RISK FACTORS:  Menarche was at age 66.  First live birth at age 62.  OCP use for approximately 2 years. (pills)  Also used IUD for several years Ovaries intact: no.  Hysterectomy: yes.  Menopausal status: postmenopausal. Started age 52 HRT use: 0 years. Colonoscopy: yes; hyperplastic polyps. Mammogram within the last year: yes. Number of breast biopsies: 0.  Past Medical History:  Diagnosis Date  . Anxiety   . Arthritis   . Cancer [facility])    endometrial cancer  . Complication of anesthesia   . Depression   . Family history of uterine cancer   . GERD (gastroesophageal reflux disease)   . Hypothyroidism    on synthroid   . PONV (postoperative nausea and vomiting)     Past Surgical History:  Procedure Laterality Date  . CESAREAN SECTION    . DIAGNOSTIC LAPAROSCOPY     fibroid tumors on her ovaries     . DILATION AND CURETTAGE OF UTERUS    . HYSTEROSCOPY W/D&C N/A 09/15/2014   Procedure: DILATATION AND CURETTAGE /HYSTEROSCOPY/MYOSURE RESECTION OF POLYP;  Surgeon: Janyth Pupa, DO;  Location: [place] [facility];  Service: Gynecology;  Laterality: N/A;  . KNEE SURGERY    . LYMPH NODE DISSECTION N/A 10/26/2014   Procedure: POSS LYMPHADENECTOMY ;  Surgeon: [name], MD;  Location: [facility];  Service: Gynecology;  Laterality: N/A;  . ROBOTIC ASSISTED TOTAL HYSTERECTOMY WITH BILATERAL SALPINGO OOPHERECTOMY Bilateral 10/26/2014   Procedure: ROBOTIC ASSISTED TOTAL HYSTERECTOMY WITH BILATERAL SALPINGO OOPHORECTOMY;  Surgeon: [name], MD;  Location: [facility];  Service: Gynecology;  Laterality: Bilateral;  . TUBAL LIGATION    . WISDOM TOOTH EXTRACTION      Social History   Social History  . Marital status: Single    Spouse name: N/A  . Number of children: 2  . Years of education: N/A   Social History Main Topics  . Smoking status: Former Smoker    Packs/day: 0.50    Years: 13.00    Types: Cigarettes    Quit date: 10/20/2007  . Smokeless tobacco: Never Used  . Alcohol use No  . Drug use: No  . Sexual activity: Not Asked   Other Topics Concern  . None   Social History Narrative  . None     FAMILY HISTORY:  We obtained a detailed, 4-generation  family history.  Significant diagnoses are listed below: Family History  Problem Relation Age of Onset  . Breast cancer Maternal Aunt        dx late 50's/60's, died in 33's  . Uterine cancer Mother 42  . Heart failure Mother   . Dementia Father   . Heart attack Paternal Grandfather 73  . Other Sister 40       had abnromal finding on mammogram, unsure if cancer. Was told to 'remove part of of body'   Ashley Villegas has a 37 year-old son and a 58 year-old daughter.  Her daughter has a son (56) and daughter (41).  The 75 year-old granddaughter has a 39 year-old daughter.  None of the patient's children, grandchildren, or great grandchildren have any  history of cancer.   Ashley Villegas has 3 sisters and 3 brothers listed below: -1 sister died at 39 (not due to health issues) and had a son who was placed for adoption (no info known).  -1 sister is 63 with no children or any history of cancer.  She has ovaries and uterus intact.  -1 sister is 24 and had a hysterectomy due to endometriosis.  She had an abnormal finding on a mammogram and was told to 'remove a part of her body'.  The patient is unsure is this was cancer or not.  It happened 4-5 years ago.  This sister did not have any children.  -1 brother is 23 with no history of cancer.  He has a 24 year-old son with no history of cancer.  -1 brother is 70 with no history of cancer.  He had 3 sons.  1 died due to trauma.  The other 2 nephews are 52 and 54 with no history of cancer.  -1 brother is 11 with no history of cancer.  He has an 49 year-old son and a 58 year-old daughter with no history of cancer.   Ashley Villegas father died at 8 and had Dimentia.   -1 paternal aunt died in her 50's with no history of cancer.  She had children, but no information is known about them.  -1 paternal aunt died in her 33's with no history of cancer.  She had children, but no information is known about them.  Ashley Villegas paternal grandfather died from a heart attack at 43.  No other cancer diagnosis are known on his side of the family.  Ashley Villegas paternal grandmother died in her 49's from age related causes.  No known cancer diagnosis on her side of the family.    Ashley Villegas mother had endometrial carcinoma in her 49's.  She died of heart failure at 41.   -1 maternal aunt died in her 1's.  She was diagnosed with breast cancer in her late 50's/early 57's.  She had no children.  -1 maternal aunt died in her 58's, but no information is known about this relative. No known history of cancer.  Ashley Villegas maternal grandfather died at 37.  No information is known about his  side of the family.  Ashley Villegas maternal grandmother died at 64 and had no history of cancer.  No information is known about her side of the family.   Ashley Villegas is unaware of previous family history of genetic testing for hereditary cancer risks. Patient's maternal ancestors are of [place] descent, and paternal ancestors are of Caucasian descent. There is no reported Ashkenazi Jewish ancestry. There is no known consanguinity.  GENETIC COUNSELING ASSESSMENT: Ashley Villegas  is a 66 y.o. female with a personal and family history which is somewhat suggestive of a Hereditary Cancer Predisposition Syndrome. We, therefore, discussed and recommended the following at today's visit.   DISCUSSION: We reviewed the characteristics, features and inheritance patterns of hereditary cancer syndromes. We also discussed genetic testing, including the appropriate family members to test, the process of testing, insurance coverage and turn-around-time for results. We discussed the implications of a negative, positive and/or variant of uncertain significant result. We discussed the option for Ms. Ashley Villegas pursue genetic testing for the [facility] Common Hereditary Cancer Panel. The Hereditary Gene Panel offered by [facility] includes sequencing and/or deletion duplication testing of the following 46 genes: APC, ATM, AXIN2, BARD1, BMPR1A, BRCA1, BRCA2, BRIP1, CDH1, CDKN2A (p14ARF), CDKN2A (p16INK4a), CHEK2, CTNNA1, DICER1, EPCAM (Deletion/duplication testing only), GREM1 (promoter region deletion/duplication testing only), KIT, MEN1, MLH1, MSH2, MSH3, MSH6, MUTYH, NBN, NF1, NHTL1, PALB2, PDGFRA, PMS2, POLD1, POLE, PTEN, RAD50, RAD51C, RAD51D, SDHB, SDHC, SDHD, SMAD4, SMARCA4. STK11, TP53, TSC1, TSC2, and VHL.  The following genes were evaluated for sequence changes only: SDHA and HOXB13 c.251G>A variant only.   We discussed that only 5-10% of cancers are associated with a Hereditary Cancer Predisposition  Syndrome.  The most common hereditary cancer syndrome associated with colon cancer is Lynch Syndrome.  Lynch Syndrome is caused by mutations in the genes: MLH1, MSH2, MSH6, PMS2 and EPCAM.  This syndrome increases the risk for colon, uterine, ovarian and stomach cancers, as well as others.  Families with Lynch Syndrome tend to have multiple family members with these cancers, typically diagnosed under age 27, and diagnoses in multiple generations.    We discussed that there are several other genes that are associated with an increased risk for colon cancer and increased polyp burden (MUTYH, APC, POLE, CHEK2, etc.) We also dicussed that there are many genes that cause many different types of cancer risks.    There is no record that Ms. Christo had Immuno-histo chemistry (IHC) testing performed on her tumor   This is a screening test used to screen for cancers that are more likely to be related to Lynch Syndrome.  We discussed that her doctors could order this test if sufficient tumor sample is available.  It would provide more information about our level of suspicion for Lynch Syndrome.    We discussed that if she is found to have a mutation in one of these genes, it may impact future medical management recommendations such as increased cancer screenings and consideration of risk reducing surgeries.  A positive result could also have implications for the patient's family members.  A Negative result would mean we were unable to identify a hereditary component to her cancer, but does not rule out the possibility of a hereditary basis for her cancer.  There could be mutations that are undetectable by current technology, or in genes not yet tested or identified to increase cancer risk.    We discussed the potential to find a Variant of Uncertain Significance or VUS.  These are variants that have not yet been identified as pathogenic or benign, and it is unknown if this variant is associated with increased  cancer risk or if this is a normal finding.  Most VUS's are reclassified to benign or likely benign.   It should not be used to make medical management decisions. With time, we suspect the lab will determine the significance of any VUS's identified if any.   Using the risk model PREMM she is calculated to have  a 6.1% chance of having a mutation in a Lynch syndrome gene.  This is above the threshold to meet NCCN criteria for genetic testing.  Because each model is different, there can be a lot of variability in the risks they give.  Therefore, these numbers must be considered a rough estimate and not a precise risk of having a Lynch Syndrome mutation.  Based on this assessment of her family and personal history, genetic testing is recommended.    She may still have an out of pocket cost should she decide to have genetic testing. We discussed that if her out of pocket cost for testing is over $100, the laboratory will call and confirm whether she wants to proceed with testing.  If the out of pocket cost of testing is less than $100 she will be billed by the genetic testing laboratory.   PLAN: After considering the risks, benefits, and limitations, Ms. Ashley Villegas wanted to have an insurance/benefits investigation performed before ordering testing to determine what the cost of testing would be.  She also mentioned that she may IHC testing with her doctors to see this testing can be done before having germline testing.  I requested a cost estimate from the laboratory to provide Ms. Ashley Villegas this information to aid in her decision.  I called Ms. Ashley Villegas to inform her what the cost of the testing would be. The cost estimate as well as a summary letter of our appointment was sent to her, and she agreed to call and let me know if she wanted to pursue genetic testing after reviewing about this information.  I told her we could coordinate a blood draw on a day that she is already coming for a radiation  appointment if she wishes.   Despite our recommendation, Ms. Ashley Villegas did not wish to pursue genetic testing at today's visit. We understand this decision, and remain available to coordinate genetic testing at any time in the future. We; therefore, recommend Ms. Ashley Villegas continue to follow the cancer screening guidelines given by her primary healthcare provider.  Lastly, we encouraged Ms. Ashley Villegas to remain in contact with cancer genetics annually so that we can continuously update the family history and inform her of any changes in cancer genetics and testing that may be of benefit for this family.   Ms.  Ashley Villegas questions were answered to her satisfaction today. Our contact information was provided should additional questions or concerns arise. Thank you for the referral and allowing [name] to share in the care of your patient.   Tana Felts, MS Genetic Counselor [email or web address] phone: [phone]  The patient was seen for a total of 35 minutes in face-to-face genetic counseling.

## 2016-11-13 ENCOUNTER — Ambulatory Visit
Admission: RE | Admit: 2016-11-13 | Discharge: 2016-11-13 | Disposition: A | Discharge status: Home / Self Care | Payer: 59 | Source: Ambulatory Visit | Attending: Radiation Oncology | Admitting: Radiation Oncology

## 2016-11-13 DIAGNOSIS — Z510 Encounter for antineoplastic radiation therapy: Secondary | ICD-10-CM | POA: Diagnosis not present

## 2016-11-14 ENCOUNTER — Ambulatory Visit
Admission: RE | Admit: 2016-11-14 | Discharge: 2016-11-14 | Disposition: A | Discharge status: Home / Self Care | Payer: 59 | Source: Ambulatory Visit | Attending: Radiation Oncology | Admitting: Radiation Oncology

## 2016-11-14 DIAGNOSIS — Z510 Encounter for antineoplastic radiation therapy: Secondary | ICD-10-CM | POA: Diagnosis not present

## 2016-11-14 DIAGNOSIS — C541 Malignant neoplasm of endometrium: Secondary | ICD-10-CM

## 2016-11-14 NOTE — Progress Notes (Signed)
Pt here for patient teaching.  Pt given Radiation and You booklet. Reviewed areas of pertinence such as diarrhea, fatigue, nausea and vomiting, skin changes and urinary and bladder changes . Pt able to give teach back of to pat skin, use unscented/gentle soap, use baby wipes, have Imodium on hand, drink plenty of water and sitz bath,avoid applying anything to skin within 4 hours of treatment. Pt demonstrated understanding and verbalizes understanding of information given and will contact nursing with any questions or concerns.        

## 2016-11-15 ENCOUNTER — Ambulatory Visit
Admission: RE | Admit: 2016-11-15 | Discharge: 2016-11-15 | Disposition: A | Discharge status: Home / Self Care | Payer: 59 | Source: Ambulatory Visit | Attending: Radiation Oncology | Admitting: Radiation Oncology

## 2016-11-15 DIAGNOSIS — Z510 Encounter for antineoplastic radiation therapy: Secondary | ICD-10-CM | POA: Diagnosis not present

## 2016-11-16 ENCOUNTER — Ambulatory Visit
Admission: RE | Admit: 2016-11-16 | Discharge: 2016-11-16 | Disposition: A | Discharge status: Home / Self Care | Payer: 59 | Source: Ambulatory Visit | Attending: Radiation Oncology | Admitting: Radiation Oncology

## 2016-11-16 ENCOUNTER — Encounter: Payer: Self-pay | Admitting: Genetics

## 2016-11-16 DIAGNOSIS — Z510 Encounter for antineoplastic radiation therapy: Secondary | ICD-10-CM | POA: Diagnosis not present

## 2016-11-16 NOTE — Telephone Encounter (Signed)
Gave the patient the estimated cost for genetic testing.  Sent her this information and told her that if she decided to have genetic testing to let us know and we can coordinate the blood draw for a day she is already coming for an appointment.

## 2016-11-19 ENCOUNTER — Ambulatory Visit: Payer: 59

## 2016-11-20 ENCOUNTER — Ambulatory Visit
Admission: RE | Admit: 2016-11-20 | Discharge: 2016-11-20 | Disposition: A | Discharge status: Home / Self Care | Payer: 59 | Source: Ambulatory Visit | Attending: Radiation Oncology | Admitting: Radiation Oncology

## 2016-11-20 DIAGNOSIS — Z510 Encounter for antineoplastic radiation therapy: Secondary | ICD-10-CM | POA: Diagnosis not present

## 2016-11-21 ENCOUNTER — Ambulatory Visit
Admission: RE | Admit: 2016-11-21 | Discharge: 2016-11-21 | Disposition: A | Discharge status: Home / Self Care | Payer: 59 | Source: Ambulatory Visit | Attending: Radiation Oncology | Admitting: Radiation Oncology

## 2016-11-21 DIAGNOSIS — Z510 Encounter for antineoplastic radiation therapy: Secondary | ICD-10-CM | POA: Diagnosis not present

## 2016-11-22 ENCOUNTER — Ambulatory Visit
Admission: RE | Admit: 2016-11-22 | Discharge: 2016-11-22 | Disposition: A | Discharge status: Home / Self Care | Payer: 59 | Source: Ambulatory Visit | Attending: Radiation Oncology | Admitting: Radiation Oncology

## 2016-11-22 DIAGNOSIS — Z510 Encounter for antineoplastic radiation therapy: Secondary | ICD-10-CM | POA: Diagnosis not present

## 2016-11-23 ENCOUNTER — Ambulatory Visit
Admission: RE | Admit: 2016-11-23 | Discharge: 2016-11-23 | Disposition: A | Discharge status: Home / Self Care | Payer: 59 | Source: Ambulatory Visit | Attending: Radiation Oncology | Admitting: Radiation Oncology

## 2016-11-23 DIAGNOSIS — Z510 Encounter for antineoplastic radiation therapy: Secondary | ICD-10-CM | POA: Diagnosis not present

## 2016-11-27 ENCOUNTER — Ambulatory Visit
Admission: RE | Admit: 2016-11-27 | Discharge: 2016-11-27 | Disposition: A | Discharge status: Home / Self Care | Payer: 59 | Source: Ambulatory Visit | Attending: Radiation Oncology | Admitting: Radiation Oncology

## 2016-11-27 DIAGNOSIS — Z510 Encounter for antineoplastic radiation therapy: Secondary | ICD-10-CM | POA: Diagnosis not present

## 2016-11-28 ENCOUNTER — Ambulatory Visit
Admission: RE | Admit: 2016-11-28 | Discharge: 2016-11-28 | Disposition: A | Discharge status: Home / Self Care | Payer: 59 | Source: Ambulatory Visit | Attending: Radiation Oncology | Admitting: Radiation Oncology

## 2016-11-28 DIAGNOSIS — Z510 Encounter for antineoplastic radiation therapy: Secondary | ICD-10-CM | POA: Diagnosis not present

## 2016-11-29 ENCOUNTER — Ambulatory Visit
Admission: RE | Admit: 2016-11-29 | Discharge: 2016-11-29 | Disposition: A | Discharge status: Home / Self Care | Payer: 59 | Source: Ambulatory Visit | Attending: Radiation Oncology | Admitting: Radiation Oncology

## 2016-11-29 DIAGNOSIS — Z510 Encounter for antineoplastic radiation therapy: Secondary | ICD-10-CM | POA: Diagnosis not present

## 2016-11-30 ENCOUNTER — Ambulatory Visit
Admission: RE | Admit: 2016-11-30 | Discharge: 2016-11-30 | Disposition: A | Discharge status: Home / Self Care | Payer: 59 | Source: Ambulatory Visit | Attending: Radiation Oncology | Admitting: Radiation Oncology

## 2016-11-30 DIAGNOSIS — Z510 Encounter for antineoplastic radiation therapy: Secondary | ICD-10-CM | POA: Diagnosis not present

## 2016-12-03 ENCOUNTER — Ambulatory Visit
Admission: RE | Admit: 2016-12-03 | Discharge: 2016-12-03 | Disposition: A | Discharge status: Home / Self Care | Payer: 59 | Source: Ambulatory Visit | Attending: Radiation Oncology | Admitting: Radiation Oncology

## 2016-12-03 DIAGNOSIS — Z510 Encounter for antineoplastic radiation therapy: Secondary | ICD-10-CM | POA: Diagnosis not present

## 2016-12-04 ENCOUNTER — Ambulatory Visit
Admission: RE | Admit: 2016-12-04 | Discharge: 2016-12-04 | Disposition: A | Discharge status: Home / Self Care | Payer: 59 | Source: Ambulatory Visit | Attending: Radiation Oncology | Admitting: Radiation Oncology

## 2016-12-04 DIAGNOSIS — Z510 Encounter for antineoplastic radiation therapy: Secondary | ICD-10-CM | POA: Diagnosis not present

## 2016-12-05 ENCOUNTER — Ambulatory Visit
Admission: RE | Admit: 2016-12-05 | Discharge: 2016-12-05 | Disposition: A | Discharge status: Home / Self Care | Payer: 59 | Source: Ambulatory Visit | Attending: Radiation Oncology | Admitting: Radiation Oncology

## 2016-12-05 DIAGNOSIS — Z510 Encounter for antineoplastic radiation therapy: Secondary | ICD-10-CM | POA: Diagnosis not present

## 2016-12-06 ENCOUNTER — Ambulatory Visit
Admission: RE | Admit: 2016-12-06 | Discharge: 2016-12-06 | Disposition: A | Discharge status: Home / Self Care | Payer: 59 | Source: Ambulatory Visit | Attending: Radiation Oncology | Admitting: Radiation Oncology

## 2016-12-06 DIAGNOSIS — Z510 Encounter for antineoplastic radiation therapy: Secondary | ICD-10-CM | POA: Diagnosis not present

## 2016-12-07 ENCOUNTER — Ambulatory Visit
Admission: RE | Admit: 2016-12-07 | Discharge: 2016-12-07 | Disposition: A | Discharge status: Home / Self Care | Payer: 59 | Source: Ambulatory Visit | Attending: Radiation Oncology | Admitting: Radiation Oncology

## 2016-12-07 DIAGNOSIS — Z510 Encounter for antineoplastic radiation therapy: Secondary | ICD-10-CM | POA: Diagnosis not present

## 2016-12-10 ENCOUNTER — Ambulatory Visit
Admission: RE | Admit: 2016-12-10 | Discharge: 2016-12-10 | Disposition: A | Discharge status: Home / Self Care | Payer: 59 | Source: Ambulatory Visit | Attending: Radiation Oncology | Admitting: Radiation Oncology

## 2016-12-10 DIAGNOSIS — Z510 Encounter for antineoplastic radiation therapy: Secondary | ICD-10-CM | POA: Diagnosis not present

## 2016-12-11 ENCOUNTER — Ambulatory Visit
Admission: RE | Admit: 2016-12-11 | Discharge: 2016-12-11 | Disposition: A | Discharge status: Home / Self Care | Payer: 59 | Source: Ambulatory Visit | Attending: Radiation Oncology | Admitting: Radiation Oncology

## 2016-12-11 DIAGNOSIS — Z510 Encounter for antineoplastic radiation therapy: Secondary | ICD-10-CM | POA: Diagnosis not present

## 2016-12-12 ENCOUNTER — Ambulatory Visit
Admission: RE | Admit: 2016-12-12 | Discharge: 2016-12-12 | Disposition: A | Discharge status: Home / Self Care | Payer: 59 | Source: Ambulatory Visit | Attending: Radiation Oncology | Admitting: Radiation Oncology

## 2016-12-12 DIAGNOSIS — Z510 Encounter for antineoplastic radiation therapy: Secondary | ICD-10-CM | POA: Diagnosis not present

## 2016-12-13 ENCOUNTER — Ambulatory Visit
Admission: RE | Admit: 2016-12-13 | Discharge: 2016-12-13 | Disposition: A | Discharge status: Home / Self Care | Payer: 59 | Source: Ambulatory Visit | Attending: Radiation Oncology | Admitting: Radiation Oncology

## 2016-12-13 ENCOUNTER — Ambulatory Visit: Payer: 59

## 2016-12-13 DIAGNOSIS — C541 Malignant neoplasm of endometrium: Secondary | ICD-10-CM

## 2016-12-13 DIAGNOSIS — Z510 Encounter for antineoplastic radiation therapy: Secondary | ICD-10-CM | POA: Diagnosis not present

## 2016-12-13 NOTE — Progress Notes (Signed)
  Radiation Oncology         [phone] ________________________________  Name: Ashley Villegas MRN: [id number]  Date: 12/13/2016  DOB: [birth year]  SIMULATION AND TREATMENT PLANNING NOTE    ICD-10-CM   1. Endometrial cancer ([place]) C54.1     DIAGNOSIS:  66 y.o. woman with recurrent endometrial cancer, stage IA grade 1, status post hysterectomy.   NARRATIVE:  The patient was brought to the [facility].  Identity was confirmed.  All relevant records and images related to the planned course of therapy were reviewed.  The patient freely provided informed written consent to proceed with treatment after reviewing the details related to the planned course of therapy. The consent form was witnessed and verified by the simulation staff.  Then, the patient was set-up in a stable reproducible  supine position for radiation therapy.  CT images were obtained.  Surface markings were placed.  The CT images were loaded into the planning software.  Then the target and avoidance structures were contoured.  Treatment planning then occurred.  The radiation prescription was entered and confirmed.  Then, I designed and supervised the construction of a total of 5 medically necessary complex treatment devices.  I have requested : 3D Simulation  I have requested a DVH of the following structures: Bladder, rectum, small bowel, large bowel, gross tumor volume, planning target volume.  I have ordered:dose calc.  PLAN:  The patient will receive 14 Gy in 7 fractions directed at the area of recurrence in the right upper vaginal area. This area was noted be smaller on her CT scan today compared to her initial planning CT scan.  -----------------------------------  Blair Promise, PhD, MD  This document serves as a record of services personally performed by Gery Pray MD. It was created on his behalf by Delton Coombes, a trained medical scribe. The creation of this record is based on the scribe's  personal observations and the provider's statements to them. This document has been checked and approved by the attending provider.

## 2016-12-14 ENCOUNTER — Ambulatory Visit
Admission: RE | Admit: 2016-12-14 | Discharge: 2016-12-14 | Disposition: A | Discharge status: Home / Self Care | Payer: 59 | Source: Ambulatory Visit | Attending: Radiation Oncology | Admitting: Radiation Oncology

## 2016-12-14 DIAGNOSIS — Z510 Encounter for antineoplastic radiation therapy: Secondary | ICD-10-CM | POA: Diagnosis not present

## 2016-12-17 DIAGNOSIS — Z510 Encounter for antineoplastic radiation therapy: Secondary | ICD-10-CM | POA: Diagnosis not present

## 2016-12-19 ENCOUNTER — Ambulatory Visit
Admission: RE | Admit: 2016-12-19 | Discharge: 2016-12-19 | Disposition: A | Discharge status: Home / Self Care | Payer: 59 | Source: Ambulatory Visit | Attending: Radiation Oncology | Admitting: Radiation Oncology

## 2016-12-19 DIAGNOSIS — Z510 Encounter for antineoplastic radiation therapy: Secondary | ICD-10-CM | POA: Diagnosis not present

## 2016-12-19 DIAGNOSIS — C541 Malignant neoplasm of endometrium: Secondary | ICD-10-CM

## 2016-12-19 NOTE — Progress Notes (Signed)
  Radiation Oncology         [phone] ________________________________  Name: Ashley Villegas MRN: [id number]  Date: 12/19/2016  DOB: 30-May-1950  Simulation Verification Note    ICD-10-CM   1. Endometrial cancer ([place]) C54.1     Status: outpatient  NARRATIVE: The patient was brought to the treatment unit and placed in the planned treatment position. The clinical setup was verified. Then port films were obtained and uploaded to the radiation oncology medical record software.  The treatment beams were carefully compared against the planned radiation fields. The position location and shape of the radiation fields was reviewed. They targeted volume of tissue appears to be appropriately covered by the radiation beams. Organs at risk appear to be excluded as planned.  Based on my personal review, I approved the simulation verification. The patient's treatment will proceed as planned.  -----------------------------------  Blair Promise, PhD, MD

## 2016-12-20 ENCOUNTER — Ambulatory Visit
Admission: RE | Admit: 2016-12-20 | Discharge: 2016-12-20 | Disposition: A | Discharge status: Home / Self Care | Payer: 59 | Source: Ambulatory Visit | Attending: Radiation Oncology | Admitting: Radiation Oncology

## 2016-12-20 DIAGNOSIS — Z510 Encounter for antineoplastic radiation therapy: Secondary | ICD-10-CM | POA: Diagnosis not present

## 2016-12-21 ENCOUNTER — Ambulatory Visit
Admission: RE | Admit: 2016-12-21 | Discharge: 2016-12-21 | Disposition: A | Discharge status: Home / Self Care | Payer: 59 | Source: Ambulatory Visit | Attending: Radiation Oncology | Admitting: Radiation Oncology

## 2016-12-21 DIAGNOSIS — Z510 Encounter for antineoplastic radiation therapy: Secondary | ICD-10-CM | POA: Diagnosis not present

## 2016-12-24 ENCOUNTER — Ambulatory Visit
Admission: RE | Admit: 2016-12-24 | Discharge: 2016-12-24 | Disposition: A | Discharge status: Home / Self Care | Payer: 59 | Source: Ambulatory Visit | Attending: Radiation Oncology | Admitting: Radiation Oncology

## 2016-12-24 DIAGNOSIS — Z510 Encounter for antineoplastic radiation therapy: Secondary | ICD-10-CM | POA: Diagnosis not present

## 2016-12-25 ENCOUNTER — Ambulatory Visit
Admission: RE | Admit: 2016-12-25 | Discharge: 2016-12-25 | Disposition: A | Discharge status: Home / Self Care | Payer: 59 | Source: Ambulatory Visit | Attending: Radiation Oncology | Admitting: Radiation Oncology

## 2016-12-25 DIAGNOSIS — Z510 Encounter for antineoplastic radiation therapy: Secondary | ICD-10-CM | POA: Diagnosis not present

## 2016-12-26 ENCOUNTER — Ambulatory Visit
Admission: RE | Admit: 2016-12-26 | Discharge: 2016-12-26 | Disposition: A | Discharge status: Home / Self Care | Payer: 59 | Source: Ambulatory Visit | Attending: Radiation Oncology | Admitting: Radiation Oncology

## 2016-12-26 DIAGNOSIS — Z510 Encounter for antineoplastic radiation therapy: Secondary | ICD-10-CM | POA: Diagnosis not present

## 2016-12-27 ENCOUNTER — Ambulatory Visit
Admission: RE | Admit: 2016-12-27 | Discharge: 2016-12-27 | Disposition: A | Discharge status: Home / Self Care | Payer: 59 | Source: Ambulatory Visit | Attending: Radiation Oncology | Admitting: Radiation Oncology

## 2016-12-27 ENCOUNTER — Encounter: Payer: Self-pay | Admitting: Radiation Oncology

## 2016-12-27 DIAGNOSIS — Z510 Encounter for antineoplastic radiation therapy: Secondary | ICD-10-CM | POA: Diagnosis not present

## 2016-12-31 NOTE — Progress Notes (Signed)
  Radiation Oncology         [phone] ________________________________  Name: Ashley Villegas MRN: [id number]  Date: 12/27/2016  DOB: January 17, 1951  End of Treatment Note  Diagnosis:   66 y.o. woman with recurrent endometrial cancer, stage IA grade 1, status post hysterectomy.     Indication for treatment:  Definitive treatment       Radiation treatment dates:   11/08/2016 to 12/27/2016  Site/dose:    1. The pelvis was treated to 45 Gy in 25 fractions of 1.8 Gy. 2. The pelvis was boosted to 14 Gy in 7 fractions of 2 Gy.   Beams/energy:    1. IMRT // 6X 2. 3D // 6X  Narrative: The patient tolerated radiation treatment relatively well.   Diarrhea has improved. She has no concerns or complaints related to treatment during the last week of xrt. No visible or palpable lesion in the vaginal vault after 45 Gy so no brachytherapy boost given.  Plan: The patient has completed radiation treatment. The patient will return to radiation oncology clinic for routine followup in one month. I advised them to call or return sooner if they have any questions or concerns related to their recovery or treatment.  -----------------------------------  Blair Promise, PhD, MD  This document serves as a record of services personally performed by Gery Pray, MD. It was created on his behalf by Arlyce Harman, a trained medical scribe. The creation of this record is based on the scribe's personal observations and the provider's statements to them. This document has been checked and approved by the attending provider.

## 2017-01-04 ENCOUNTER — Other Ambulatory Visit: Payer: Self-pay | Admitting: Family Medicine

## 2017-01-04 DIAGNOSIS — Z1231 Encounter for screening mammogram for malignant neoplasm of breast: Secondary | ICD-10-CM

## 2017-01-22 ENCOUNTER — Telehealth: Payer: Self-pay | Admitting: Oncology

## 2017-01-22 NOTE — Telephone Encounter (Signed)
Patient called and said she is having pain in the right side of her groin and back again.  She said she had this pain when diagnosed with her cancer and it had gone away.  It came back this morning.  She denies having any vaginal bleeding or fever.  She denies having dysuria and hematuria but does report having urinary frequency.  Dr. Sondra Come advised and her follow up appointment has been changed to 01/24/17 at 1 pm.

## 2017-01-23 ENCOUNTER — Encounter: Payer: Self-pay | Admitting: Oncology

## 2017-01-24 ENCOUNTER — Encounter: Payer: Self-pay | Admitting: Radiation Oncology

## 2017-01-24 ENCOUNTER — Ambulatory Visit
Admission: RE | Admit: 2017-01-24 | Discharge: 2017-01-24 | Disposition: A | Discharge status: Home / Self Care | Payer: 59 | Source: Ambulatory Visit | Attending: Radiation Oncology | Admitting: Radiation Oncology

## 2017-01-24 VITALS — BP 127/55 | HR 81 | Temp 98.3°F | Ht 60.75 in | Wt 160.0 lb

## 2017-01-24 DIAGNOSIS — R197 Diarrhea, unspecified: Secondary | ICD-10-CM | POA: Insufficient documentation

## 2017-01-24 DIAGNOSIS — Z923 Personal history of irradiation: Secondary | ICD-10-CM | POA: Insufficient documentation

## 2017-01-24 DIAGNOSIS — R350 Frequency of micturition: Secondary | ICD-10-CM | POA: Insufficient documentation

## 2017-01-24 DIAGNOSIS — Z79899 Other long term (current) drug therapy: Secondary | ICD-10-CM | POA: Insufficient documentation

## 2017-01-24 DIAGNOSIS — Z885 Allergy status to narcotic agent status: Secondary | ICD-10-CM | POA: Insufficient documentation

## 2017-01-24 DIAGNOSIS — Y842 Radiological procedure and radiotherapy as the cause of abnormal reaction of the patient, or of later complication, without mention of misadventure at the time of the procedure: Secondary | ICD-10-CM | POA: Insufficient documentation

## 2017-01-24 DIAGNOSIS — C541 Malignant neoplasm of endometrium: Secondary | ICD-10-CM

## 2017-01-24 NOTE — Progress Notes (Signed)

## 2017-01-24 NOTE — Progress Notes (Addendum)
Ashley Villegas is here for follow up.  She had pain/cramping in her right groin radiating around to her lower back on Tuesday.  She said the pain went away in the late afternoon on Tuesday.  She is concerned because the pain felt the same as when she was diagnosed with cancer.  She reports having urinary urgency and frequency.  She denies having dysuria or hematuria.  She continues to have diarrhea at least 3 times a day.  She is not taking Imodium.  She denies having any vaginal bleeding or discharge.  She denies having any skin irritation.  Patient was given size S+ and M vaginal dialtors.  BP (!) 127/55 (BP Location: Left Arm, Patient Position: Sitting)   Pulse 81   Temp 98.3 F (36.8 C) (Oral)   Ht 5' 0.75" (1.543 m)   Wt 160 lb (72.6 kg)   SpO2 98%   BMI 30.48 kg/m    Wt Readings from Last 3 Encounters:  01/24/17 160 lb (72.6 kg)  10/29/16 167 lb 12.8 oz (76.1 kg)  10/24/16 168 lb 3.2 oz (76.3 kg)

## 2017-01-24 NOTE — Progress Notes (Signed)
Radiation Oncology         [phone] ________________________________  Name: Ashley Villegas MRN: [id number]  Date: 01/24/2017  DOB: 04/15/1950  Follow-Up Visit Note  CC: [name], MD  [name], MD    ICD-10-CM   1. Endometrial cancer ([place]) C54.1     Diagnosis:  66 y.o.woman with recurrent endometrial cancer, stage IA grade 1, status post hysterectomy.      Interval Since Last Radiation:  1 months 11/08/2016-12/27/2016  1. The pelvis was treated to 45 Gy in 25 fractions of 1.8 Gy. 2. The residual pelvic mass was boosted to 14 Gy in 7 fractions of 2 Gy. (No intracavitary lesion seen at completion of pelvic fields to boost with brachytherapy)  Narrative:  The patient returns today for routine follow-up. Pt states having pain in her right groin 1-2 weeks after beginning treatment and notes that the pain resolved on its own. She had a repeat occurrence of right groin pain on yesterday that resolved today. She voices concern regarding if she now has an increased susceptibility to other cancers. She reports urinary frequency. She reports diarrhea and has imodium, but she doesn't want to take it at this time. She denies nausea, dysuria, hematuria, skin irritation, right leg swelling, or vaginal bleeding, or vaginal discharge.                       ALLERGIES:  is allergic to hydrocodone.  Meds: Current Outpatient Prescriptions  Medication Sig Dispense Refill  . FLUoxetine (PROZAC) 40 MG capsule Take 40 mg by mouth daily.    . furosemide (LASIX) 40 MG tablet Take 40 mg by mouth daily as needed for fluid.     Marland Kitchen levothyroxine (SYNTHROID, LEVOTHROID) 88 MCG tablet Take 88 mcg by mouth daily before breakfast. Patient takes sat and sun     No current facility-administered medications for this encounter.     Physical Findings: The patient is in no acute distress. Patient is alert and oriented.  height is 5' 0.75" (1.543 m) and weight is 160 lb (72.6 kg). Her oral  temperature is 98.3 F (36.8 C). Her blood pressure is 127/55 (abnormal) and her pulse is 81. Her oxygen saturation is 98%. . Lungs are clear to auscultation bilaterally. Heart has regular rate and rhythm. No palpable cervical, supraclavicular, or axillary adenopathy. Abdomen soft, non-tender, normal bowel sounds. Pelvic exam deferred in light of recent completion of treatment.   Lab Findings: Lab Results  Component Value Date   WBC 11.7 (H) 11/25/2014   HGB 13.0 11/25/2014   HCT 38.4 11/25/2014   MCV 91.9 11/25/2014   PLT 445 (H) 11/25/2014    Radiographic Findings: No results found.  Impression:  The patient is recovering from the effects of radiation. Pt pain has subsided and we discussed that her planning  CT scan for her boost field revealed the tumor mass to be shrinking just a few weeks ago. We will hold off on repeating diagnostic CT scan at this time unless symptoms worsen.  Plan:  The patient will see Dr. Alycia Rossetti in 2 months and follow up with radiation oncology in 5 months. The pt was given a vaginal dilator and instructed on its use in light of her pelvic radiation treatment.  ____________________________________ -----------------------------------  Blair Promise, PhD, MD   This document serves as a record of services personally performed by Gery Pray, MD. It was created on his behalf by Valeta Harms, a trained medical scribe. The creation  of this record is based on the scribe's personal observations and the provider's statements to them. This document has been checked and approved by the attending provider.

## 2017-01-28 ENCOUNTER — Telehealth: Payer: Self-pay | Admitting: *Deleted

## 2017-01-28 NOTE — Telephone Encounter (Signed)
Returned patient's call and scheduled a follow up appt for November 28th at 11:15am.

## 2017-01-31 ENCOUNTER — Ambulatory Visit: Payer: 59 | Admitting: Radiation Oncology

## 2017-01-31 ENCOUNTER — Ambulatory Visit
Admission: RE | Admit: 2017-01-31 | Discharge: 2017-01-31 | Disposition: A | Discharge status: Home / Self Care | Payer: 59 | Source: Ambulatory Visit | Attending: Family Medicine | Admitting: Family Medicine

## 2017-01-31 DIAGNOSIS — Z1231 Encounter for screening mammogram for malignant neoplasm of breast: Secondary | ICD-10-CM

## 2017-02-19 NOTE — Progress Notes (Signed)
GYN oncology follow-up visit. CC: left pelvic pain   ASSESSMENT AND PLAN:  66 year old woman with stage IA grade 1 endometrial cancer status post definitive surgery on 10/26/2014. I had not seen her in approximately 15 months and she come in in August 2018 with c/o pain. Imaging and biopsy were c/w recurrent disease.  She completed external beam radiation therapy under the care of Dr. Sondra Come in October. She comes in today for follow-up. She has no evidence of recurrent disease and appears to have had a complete response. She's doing very well and is very happy.  She will follow-up with Dr. Sondra Come in 3 months return to see [name] in 6 months.   HPI:  Ashley Villegas is a 66 y.o. year old No obstetric history on file. initially seen in consultation on 09/30/14, referred by Dr Nelda Marseille for grade 1 endometrial cancer.  She then underwent a robotic hysterectomy, BSO and bilateral pelvic node dissection on 11/24/61 without complications.  Her postoperative course was uncomplicated .  Her final pathologic diagnosis is a Stage IA Grade 1 endometrioid endometrial cancer with no  lymphovascular space invasion, 5/15 mm (33%) of myometrial invasion and negative lymph nodes. Postoperatively she developed bilateral symptomatic lymphocyst. There were drained and she was treated with antibiotics.  IMPRESSION: 1. Status post hysterectomy. Pelvic edema which is greater than typically seen 3 weeks postop. Suspicious for postoperative infection. 2. Bladder wall thickening and irregularity. Although this could be partially due to underdistention, Concurrent cystitis cannot be excluded. 3. Bilateral fluid density lesions along the pelvic sidewalls. Favored to represent seromas or lymphangiomas. No specific features to suggest abscess. If the patient's symptoms persist after appropriate antibiotic therapy, aspiration of the largest left pelvic sidewall "Lesion" should be considered. 4. Suspicion of mild hepatic steatosis.  Indeterminate right hepatic lobe lesion. If definitive characterization is desired in this patient with history of primary malignancy, nonemergent pre and post contrast abdominal MRI could be performed. 5. Hiatal hernia.  IMPRESSION (9/16): 1. Complete resolution of dominant fluid collection within the left hemipelvis following percutaneous drainage catheter placement. This percutaneous drainage catheter was subsequent removed intact at the patient's bedside. 2. Continued decrease in size of bilobed fluid collections within the right hemipelvis with superficial component measuring 1.6 cm and posterior component measuring 2.6 cm, indeterminate though both favored to represent evolving seromas. 3. Unchanged indeterminate approximately 1.2 cm hepatic lesion. 4. Colonic diverticulosis without evidence of diverticulitis.  I last saw her in March 2017. She did not keep her March 2018 appointment and called stating that she was having pelvic pain and she needed to come in. She did not see her physician Dr. Suanne Marker in between our 2 visits either until last week when she saw her on Wednesday complaining of pain. She states that her a few months ago she developed food poisoning after eating out and that's when this discomfort started. On the pain is primarily on the right is worse with sitting some days are better than others. It occasionally and rarely does wake her up at night but is mostly during the day.   I saw her in clinic at which time she was tender and I felt a fullness on RV exam towards her right side. A CT scan was obtained that revealed: IMPRESSION 10/19/16: New 2.6 cm soft tissue mass involving right vaginal cuff, consistent with locally recurrent carcinoma.No other sites of metastatic disease identified. Colonic diverticulosis, without radiographic evidence of diverticulitis. Mild hepatic steatosis.   Interval History: 10/24/16: Diagnosis  Vagina, biopsy, right apex - ADENOCARCINOMA - SEE  COMMENT Microscopic Comment  The morphologic features are consistent with the patient's previously diagnosed endometrioid adenocarcinoma.  11/08/2016-12/27/2016  1. The pelvis was treated to 45 Gy in 25 fractions of 1.8 Gy. 2. The residual pelvic mass was boosted to 14 Gy in 7 fractions of 2 Gy. (No intracavitary lesion seen at completion of pelvic fields to boost with brachytherapy).  At that time CT scan for her boost field revealed the tumor mass to be shrinking and they held off on repeating diagnostic CT scan at this time unless symptoms worsen.  She saw Dr. Sondra Come 01/24/17.  She comes in today for follow up.  She's overall doing very well. She does have some loose stools about 4-5 per day she's doing fine with those. There is no blood and she does not feel like she needs Imodium. She does have a little bit of discomfort with her vaginal dilators but otherwise is pain-free. There is no bleeding being noted on either normally her with the vaginal dilators. No change in her bladder habits. There are no new medical problems and her family.  Review of Systems  Constitutional:  Denies fever. Feeling well. Skin: No rash Cardiovascular: No chest pain, shortness of breath, or edema  Pulmonary: No cough  Gastro Intestinal: No nausea, vomiting, constipation, no diarrhea but + loose stools reported. No bright red blood per rectum. Genitourinary: Denies vaginal bleeding and discharge.  Neurologic: No complaints Psychology: No complaints  Current Outpatient Medications on File Prior to Visit  Medication Sig Dispense Refill  . FLUoxetine (PROZAC) 40 MG capsule Take 40 mg by mouth daily.    . furosemide (LASIX) 40 MG tablet Take 40 mg by mouth daily as needed for fluid.     Marland Kitchen levothyroxine (SYNTHROID, LEVOTHROID) 88 MCG tablet Take 88 mcg by mouth daily before breakfast. Patient takes sat and sun     No current facility-administered medications on file prior to visit.    Allergies  Allergen  Reactions  . Hydrocodone Rash   Past Medical History:  Diagnosis Date  . Anxiety   . Arthritis   . Cancer [facility])    endometrial cancer  . Complication of anesthesia   . Depression   . Family history of uterine cancer   . GERD (gastroesophageal reflux disease)   . History of radiation therapy 11/08/2016-12/27/2016   pelvis 45 Gy in 25 fractions, pelvis boost 14 gy in 7 fractions  . Hypothyroidism    on synthroid   . PONV (postoperative nausea and vomiting)    Past Surgical History:  Procedure Laterality Date  . CESAREAN SECTION    . DIAGNOSTIC LAPAROSCOPY     fibroid tumors on her ovaries   . DILATION AND CURETTAGE OF UTERUS    . HYSTEROSCOPY W/D&C N/A 09/15/2014   Procedure: DILATATION AND CURETTAGE /HYSTEROSCOPY/MYOSURE RESECTION OF POLYP;  Surgeon: Janyth Pupa, DO;  Location: [facility];  Service: Gynecology;  Laterality: N/A;  . KNEE SURGERY    . LYMPH NODE DISSECTION N/A 10/26/2014   Procedure: POSS LYMPHADENECTOMY ;  Surgeon: [name], MD;  Location: [facility];  Service: Gynecology;  Laterality: N/A;  . ROBOTIC ASSISTED TOTAL HYSTERECTOMY WITH BILATERAL SALPINGO OOPHERECTOMY Bilateral 10/26/2014   Procedure: ROBOTIC ASSISTED TOTAL HYSTERECTOMY WITH BILATERAL SALPINGO OOPHORECTOMY;  Surgeon: [name], MD;  Location: [facility];  Service: Gynecology;  Laterality: Bilateral;  . TUBAL LIGATION    . WISDOM TOOTH EXTRACTION     Family History  Problem  Relation Age of Onset  . Breast cancer Maternal Aunt        dx late 50's/60's, died in 72's  . Uterine cancer Mother 59  . Heart failure Mother   . Dementia Father   . Heart attack Paternal Grandfather 15  . Other Sister 90       had abnromal finding on mammogram, unsure if cancer. Was told to 'remove part of of body'   Social History   Socioeconomic History  . Marital status: Single    Spouse name: Not on file  . Number of children: 2  . Years of education: Not on file  . Highest education level: Not on file  Social Needs   . Financial resource strain: Not on file  . Food insecurity - worry: Not on file  . Food insecurity - inability: Not on file  . Transportation needs - medical: Not on file  . Transportation needs - non-medical: Not on file  Occupational History  . Not on file  Tobacco Use  . Smoking status: Former Smoker    Packs/day: 0.50    Years: 13.00    Pack years: 6.50    Types: Cigarettes    Last attempt to quit: 10/20/2007    Years since quitting: 9.3  . Smokeless tobacco: Never Used  Substance and Sexual Activity  . Alcohol use: No  . Drug use: No  . Sexual activity: Not on file  Other Topics Concern  . Not on file  Social History Narrative  . Not on file   Physical Exam: Blood pressure 119/61, pulse 72, temperature 98.6 F (37 C), temperature source Oral, resp. rate 16, height 5' 0.75" (1.543 m), weight 157 lb 8 oz (71.4 kg), SpO2 98 %. General: Well dressed, well nourished in no apparent distress.    Neck: Supple, no lymphadenopathy, no thyromegaly.  Lungs: Clear to auscultation bilaterally.  Cardiac: Regular rate and rhythm.  Abdomen: Well-healed surgical incisions. No evidence of any incisional hernias. Abdomen is soft and nontender.  Groins: No lymphadenopathy.  Extremities: No edema.  Pelvic: External genitalia within normal limits. Vagina is markedly atrophic. The vaginal cuff is visualized. There are no visible lesions. Bimanual examination reveals no masses or nodularity. Rectal confirms.  [name] A., MD

## 2017-02-20 ENCOUNTER — Ambulatory Visit: Payer: 59 | Attending: Gynecologic Oncology | Admitting: Gynecologic Oncology

## 2017-02-20 ENCOUNTER — Encounter: Payer: Self-pay | Admitting: Gynecologic Oncology

## 2017-02-20 VITALS — BP 119/61 | HR 72 | Temp 98.6°F | Resp 16 | Ht 60.75 in | Wt 157.5 lb

## 2017-02-20 DIAGNOSIS — Z8049 Family history of malignant neoplasm of other genital organs: Secondary | ICD-10-CM | POA: Diagnosis not present

## 2017-02-20 DIAGNOSIS — Z79890 Hormone replacement therapy (postmenopausal): Secondary | ICD-10-CM | POA: Diagnosis not present

## 2017-02-20 DIAGNOSIS — Z90722 Acquired absence of ovaries, bilateral: Secondary | ICD-10-CM | POA: Insufficient documentation

## 2017-02-20 DIAGNOSIS — Z8542 Personal history of malignant neoplasm of other parts of uterus: Secondary | ICD-10-CM | POA: Diagnosis not present

## 2017-02-20 DIAGNOSIS — Z87891 Personal history of nicotine dependence: Secondary | ICD-10-CM | POA: Insufficient documentation

## 2017-02-20 DIAGNOSIS — Z90710 Acquired absence of both cervix and uterus: Secondary | ICD-10-CM | POA: Diagnosis not present

## 2017-02-20 DIAGNOSIS — Z08 Encounter for follow-up examination after completed treatment for malignant neoplasm: Secondary | ICD-10-CM | POA: Diagnosis present

## 2017-02-20 DIAGNOSIS — F329 Major depressive disorder, single episode, unspecified: Secondary | ICD-10-CM | POA: Diagnosis not present

## 2017-02-20 DIAGNOSIS — Z8249 Family history of ischemic heart disease and other diseases of the circulatory system: Secondary | ICD-10-CM | POA: Insufficient documentation

## 2017-02-20 DIAGNOSIS — Z923 Personal history of irradiation: Secondary | ICD-10-CM | POA: Diagnosis not present

## 2017-02-20 DIAGNOSIS — E039 Hypothyroidism, unspecified: Secondary | ICD-10-CM | POA: Diagnosis not present

## 2017-02-20 DIAGNOSIS — K219 Gastro-esophageal reflux disease without esophagitis: Secondary | ICD-10-CM | POA: Diagnosis not present

## 2017-02-20 DIAGNOSIS — F419 Anxiety disorder, unspecified: Secondary | ICD-10-CM | POA: Diagnosis not present

## 2017-02-20 DIAGNOSIS — M1990 Unspecified osteoarthritis, unspecified site: Secondary | ICD-10-CM | POA: Diagnosis not present

## 2017-02-20 DIAGNOSIS — Z79899 Other long term (current) drug therapy: Secondary | ICD-10-CM | POA: Insufficient documentation

## 2017-02-20 DIAGNOSIS — C541 Malignant neoplasm of endometrium: Secondary | ICD-10-CM

## 2017-02-20 NOTE — Patient Instructions (Signed)
Plan to follow up with Dr. Sondra Come in March 2019 and Dr. Alycia Rossetti in June 2019.  Dr. Clabe Seal office will call you to adjust your appointment.  Please call [phone] after you see Dr. Sondra Come in March to schedule your appointment for June with Dr. Alycia Rossetti.

## 2017-03-26 HISTORY — PX: HAND SURGERY: SHX662

## 2017-05-21 ENCOUNTER — Telehealth: Payer: Self-pay | Admitting: *Deleted

## 2017-05-21 NOTE — Telephone Encounter (Signed)
Returned patient's phone call, lvm for a return call 

## 2017-05-27 ENCOUNTER — Ambulatory Visit
Admission: RE | Admit: 2017-05-27 | Discharge: 2017-05-27 | Disposition: A | Discharge status: Home / Self Care | Payer: 59 | Source: Ambulatory Visit | Attending: Radiation Oncology | Admitting: Radiation Oncology

## 2017-05-27 ENCOUNTER — Encounter: Payer: Self-pay | Admitting: Radiation Oncology

## 2017-05-27 ENCOUNTER — Other Ambulatory Visit: Payer: Self-pay

## 2017-05-27 VITALS — BP 140/74 | HR 80 | Temp 98.6°F | Resp 18 | Wt 160.0 lb

## 2017-05-27 DIAGNOSIS — C541 Malignant neoplasm of endometrium: Secondary | ICD-10-CM | POA: Diagnosis not present

## 2017-05-27 DIAGNOSIS — Z79899 Other long term (current) drug therapy: Secondary | ICD-10-CM | POA: Insufficient documentation

## 2017-05-27 DIAGNOSIS — Z90710 Acquired absence of both cervix and uterus: Secondary | ICD-10-CM | POA: Insufficient documentation

## 2017-05-27 DIAGNOSIS — Z885 Allergy status to narcotic agent status: Secondary | ICD-10-CM | POA: Diagnosis not present

## 2017-05-27 LAB — CBC (CANCER CENTER ONLY)
HEMATOCRIT: 38.9 % (ref 34.8–46.6)
Hemoglobin: 13.3 g/dL (ref 11.6–15.9)
MCH: 31.4 pg (ref 25.1–34.0)
MCHC: 34.2 g/dL (ref 31.5–36.0)
MCV: 91.7 fL (ref 79.5–101.0)
PLATELETS: 227 10*3/uL (ref 145–400)
RBC: 4.24 MIL/uL (ref 3.70–5.45)
RDW: 13.3 % (ref 11.2–14.5)
WBC: 5.7 10*3/uL (ref 3.9–10.3)

## 2017-05-27 LAB — BASIC METABOLIC PANEL - CANCER CENTER ONLY
ANION GAP: 9 (ref 3–11)
BUN: 13 mg/dL (ref 7–26)
CHLORIDE: 106 mmol/L (ref 98–109)
CO2: 26 mmol/L (ref 22–29)
Calcium: 10.5 mg/dL — ABNORMAL HIGH (ref 8.4–10.4)
Creatinine: 0.8 mg/dL (ref 0.60–1.10)
Glucose, Bld: 91 mg/dL (ref 70–140)
POTASSIUM: 3.8 mmol/L (ref 3.5–5.1)
SODIUM: 141 mmol/L (ref 136–145)

## 2017-05-27 NOTE — Progress Notes (Signed)
Radiation Oncology         [phone] ________________________________  Name: Ashley Villegas MRN: [id number]  Date: 05/27/2017  DOB: 06-Feb-1951  Follow-Up Visit Note  CC: [name], MD  [name], MD    ICD-10-CM   1. Endometrial cancer ([place]) C54.1 CBC (Cancer Center Only)    [place] Only    Diagnosis:  67 y.o.woman with recurrent endometrial cancer, stage IA grade 1, status post hysterectomy.     Interval Since Last Radiation:  5 months 11/08/2016-12/27/2016  1. The pelvis was treated to 45 Gy in 25 fractions of 1.8 Gy. 2. The residual pelvic mass was boosted to 14 Gy in 7 fractions of 2 Gy. (No intracavitary lesion seen at completion of pelvic fields to boost with brachytherapy)  Narrative:  The patient returns today for routine follow-up. She is accompanied by her daughter today. She last followed up with Dr. Alycia Rossetti, who felt that the patient had great response to treatment and was in remission  On review of systems, she reports right groin pain x 1.5 weeks worsened with bowel movements, rectal bleeding (related to hemorrhoids), loose stools. She denies swelling, numbness, tingling, abdominal pain, pelvic pain, abdominal bloating, nausea, vomiting, diarrhea, and any other symptoms.                        ALLERGIES:  is allergic to hydrocodone.  Meds: Current Outpatient Medications  Medication Sig Dispense Refill  . FLUoxetine (PROZAC) 40 MG capsule Take 40 mg by mouth daily.    Marland Kitchen levothyroxine (SYNTHROID, LEVOTHROID) 88 MCG tablet Take 88 mcg by mouth daily before breakfast. Patient takes sat and sun    . furosemide (LASIX) 40 MG tablet Take 40 mg by mouth daily as needed for fluid.      No current facility-administered medications for this encounter.     Physical Findings: The patient is in no acute distress. Patient is alert and oriented.  weight is 160 lb (72.6 kg). Her oral temperature is 98.6 F (37 C). Her blood  pressure is 140/74 and her pulse is 80. Her respiration is 18 and oxygen saturation is 100%. . Lungs are clear to auscultation bilaterally. Heart has regular rate and rhythm. No palpable cervical, supraclavicular, or axillary adenopathy. Abdomen soft, non-tender, normal bowel sounds. On pelvic examination the external genitalia were unremarkable. A speculum exam was performed. There are no mucosal lesions noted in the vaginal vault. On bimanual and rectovaginal examination there were no pelvic masses appreciated. Patient is extremely tender with pelvic exam when touching the region of the vaginal cuff and when palpating in the right lower quadrant on bimanual exam.   Lab Findings: Lab Results  Component Value Date   WBC 11.7 (H) 11/25/2014   HGB 13.0 11/25/2014   HCT 38.4 11/25/2014   MCV 91.9 11/25/2014   PLT 445 (H) 11/25/2014    Radiographic Findings: No results found.  Impression:  No evidence of recurrence on clinical exam, however patient pain complaints are concerning and will order CT scan of abdomen and pelvis to rule out additional recurrence.   Plan:  CT scan abdomen pelvis. Pt will present to the lab for blood work today. Routine follow up in 3 months with Gyn-Onc and 6 months with radiation oncology, assuming her workup is negative.   ____________________________________ -----------------------------------  Blair Promise, PhD, MD   This document serves as a record of services personally performed by Gery Pray, MD.  It was created on his behalf by [employer], a trained medical scribe. The creation of this record is based on the scribe's personal observations and the provider's statements to them. This document has been checked and approved by the attending provider.

## 2017-05-27 NOTE — Progress Notes (Signed)
Ashley Villegas is here today for he follow-up appointment. States that she has pain in her groin area. She rates her pain a 3-4.States that she has  not taken any pain medication. States that she has mild fatigue. Denies any issues with her bladder. States that she has four loose stool per day,but she is not taking any thing for relief. Denies any vaginal bleeding. States that she has rectal bleeding ,but she believes it due to her hemorrhoids.Denies any vaginal discharge. Denies any nausea or vomiting. States that she is using her dilator three times per week. Denies any skin irration. Wt Readings from Last 3 Encounters:  05/27/17 160 lb (72.6 kg)  02/20/17 157 lb 8 oz (71.4 kg)  01/24/17 160 lb (72.6 kg)   Vitals:   05/27/17 1525  BP: 140/74  Pulse: 80  Resp: 18  Temp: 98.6 F (37 C)  TempSrc: Oral  SpO2: 100%  Weight: 160 lb (72.6 kg)

## 2017-05-28 ENCOUNTER — Telehealth: Payer: Self-pay | Admitting: *Deleted

## 2017-05-28 NOTE — Telephone Encounter (Signed)
Called patient to inform of fu with Dr. Denman George on 08-28-17- arrival time - 1:15 pm, spoke with patient and she is aware of this appt.

## 2017-05-28 NOTE — Telephone Encounter (Signed)
Returned [name]'s call from radiation, scheduled a follow up appt with Dr. Denman George in three months. Patient scheduled for June 5th at 1:30pm

## 2017-06-04 ENCOUNTER — Telehealth: Payer: Self-pay | Admitting: *Deleted

## 2017-06-04 NOTE — Telephone Encounter (Signed)
Called patient to inform that Ct will be done on 06-12-17 - arrival time - 7:45 am @ [facility], pt. tbe NPO- 4 hrs. prior to test, patient to pick -up contrast day before scan in Radiology, spoke with patient and she is aware of these appts.

## 2017-06-07 ENCOUNTER — Ambulatory Visit (HOSPITAL_COMMUNITY): Payer: Medicare Other

## 2017-06-10 ENCOUNTER — Ambulatory Visit: Payer: Self-pay | Admitting: Radiation Oncology

## 2017-06-12 ENCOUNTER — Encounter (HOSPITAL_COMMUNITY): Payer: Self-pay

## 2017-06-12 ENCOUNTER — Ambulatory Visit (HOSPITAL_COMMUNITY)
Admission: RE | Admit: 2017-06-12 | Discharge: 2017-06-12 | Disposition: A | Discharge status: Home / Self Care | Payer: 59 | Source: Ambulatory Visit | Attending: Radiation Oncology | Admitting: Radiation Oncology

## 2017-06-12 DIAGNOSIS — I700 Atherosclerosis of aorta: Secondary | ICD-10-CM | POA: Insufficient documentation

## 2017-06-12 DIAGNOSIS — R911 Solitary pulmonary nodule: Secondary | ICD-10-CM | POA: Diagnosis not present

## 2017-06-12 DIAGNOSIS — I517 Cardiomegaly: Secondary | ICD-10-CM | POA: Insufficient documentation

## 2017-06-12 DIAGNOSIS — K449 Diaphragmatic hernia without obstruction or gangrene: Secondary | ICD-10-CM | POA: Diagnosis not present

## 2017-06-12 DIAGNOSIS — M5136 Other intervertebral disc degeneration, lumbar region: Secondary | ICD-10-CM | POA: Diagnosis not present

## 2017-06-12 DIAGNOSIS — C541 Malignant neoplasm of endometrium: Secondary | ICD-10-CM | POA: Insufficient documentation

## 2017-06-12 MED ORDER — IOPAMIDOL (ISOVUE-300) INJECTION 61%
100.0000 mL | Freq: Once | INTRAVENOUS | Status: AC | PRN
  Administered 2017-06-12: 100 mL via INTRAVENOUS

## 2017-06-12 MED ORDER — IOPAMIDOL (ISOVUE-300) INJECTION 61%
INTRAVENOUS | Status: AC
  Administered 2017-06-12: 100 mL via INTRAVENOUS
  Filled 2017-06-12: qty 100

## 2017-06-19 ENCOUNTER — Telehealth: Payer: Self-pay | Admitting: *Deleted

## 2017-06-19 NOTE — Telephone Encounter (Signed)
Called and moved the patient's appt from June 5th with Dr. Denman George to June 3rd with Dr. Gerarda Fraction

## 2017-06-25 ENCOUNTER — Other Ambulatory Visit: Payer: Self-pay | Admitting: Radiation Oncology

## 2017-06-25 DIAGNOSIS — R911 Solitary pulmonary nodule: Secondary | ICD-10-CM

## 2017-06-27 ENCOUNTER — Ambulatory Visit: Payer: Self-pay | Admitting: Radiation Oncology

## 2017-07-02 ENCOUNTER — Other Ambulatory Visit: Payer: Self-pay | Admitting: Oncology

## 2017-07-02 DIAGNOSIS — R918 Other nonspecific abnormal finding of lung field: Secondary | ICD-10-CM

## 2017-07-04 ENCOUNTER — Ambulatory Visit (HOSPITAL_COMMUNITY): Payer: Medicare Other

## 2017-07-05 ENCOUNTER — Ambulatory Visit (HOSPITAL_COMMUNITY)
Admission: RE | Admit: 2017-07-05 | Discharge: 2017-07-05 | Disposition: A | Discharge status: Home / Self Care | Payer: 59 | Source: Ambulatory Visit | Attending: Radiation Oncology | Admitting: Radiation Oncology

## 2017-07-05 DIAGNOSIS — R918 Other nonspecific abnormal finding of lung field: Secondary | ICD-10-CM | POA: Diagnosis present

## 2017-07-05 DIAGNOSIS — I700 Atherosclerosis of aorta: Secondary | ICD-10-CM | POA: Insufficient documentation

## 2017-07-05 MED ORDER — IOHEXOL 300 MG/ML  SOLN
75.0000 mL | Freq: Once | INTRAMUSCULAR | Status: AC | PRN
  Administered 2017-07-05: 75 mL via INTRAVENOUS

## 2017-07-15 ENCOUNTER — Encounter: Payer: Self-pay | Admitting: Gynecologic Oncology

## 2017-07-15 NOTE — Progress Notes (Signed)
Gynecologic Oncology Multi-Disciplinary Disposition Conference Note  Date of the Conference: July 15, 2017  Patient Name: Ashley Villegas  Referring Provider: Dr. Nelda Marseille Primary GYN Oncologist: Dr. Alycia Rossetti  Stage/Disposition:  Recurrent endometrial adenocarcinoma.  Disposition is to possible lung biopsy with recommendation for chemotherapy if positive for malignancy.  MMR ordered on [id number].    This Multidisciplinary conference took place involving physicians from [place], [place], Radiation Oncology, Pathology, Radiology along with the Gynecologic Oncology Nurse Practitioner and RN.  Comprehensive assessment of the patient's malignancy, staging, need for surgery, chemotherapy, radiation therapy, and need for further testing were reviewed. Supportive measures, both inpatient and following discharge were also discussed. The recommended plan of care is documented. Greater than 35 minutes were spent correlating and coordinating this patient's care.

## 2017-07-16 ENCOUNTER — Other Ambulatory Visit: Payer: Self-pay | Admitting: Radiation Oncology

## 2017-07-16 DIAGNOSIS — C541 Malignant neoplasm of endometrium: Secondary | ICD-10-CM

## 2017-07-24 ENCOUNTER — Other Ambulatory Visit: Payer: Self-pay | Admitting: Radiology

## 2017-07-25 ENCOUNTER — Ambulatory Visit (HOSPITAL_COMMUNITY)
Admission: RE | Admit: 2017-07-25 | Discharge: 2017-07-25 | Disposition: A | Discharge status: Home / Self Care | Payer: 59 | Source: Ambulatory Visit | Attending: Interventional Radiology | Admitting: Interventional Radiology

## 2017-07-25 ENCOUNTER — Encounter (HOSPITAL_COMMUNITY): Payer: Self-pay

## 2017-07-25 ENCOUNTER — Ambulatory Visit (HOSPITAL_COMMUNITY)
Admission: RE | Admit: 2017-07-25 | Discharge: 2017-07-25 | Disposition: A | Discharge status: Home / Self Care | Payer: 59 | Source: Ambulatory Visit | Attending: Radiation Oncology | Admitting: Radiation Oncology

## 2017-07-25 DIAGNOSIS — C7801 Secondary malignant neoplasm of right lung: Secondary | ICD-10-CM | POA: Insufficient documentation

## 2017-07-25 DIAGNOSIS — R1030 Lower abdominal pain, unspecified: Secondary | ICD-10-CM | POA: Diagnosis not present

## 2017-07-25 DIAGNOSIS — Z87891 Personal history of nicotine dependence: Secondary | ICD-10-CM | POA: Diagnosis not present

## 2017-07-25 DIAGNOSIS — F419 Anxiety disorder, unspecified: Secondary | ICD-10-CM | POA: Insufficient documentation

## 2017-07-25 DIAGNOSIS — Z79890 Hormone replacement therapy (postmenopausal): Secondary | ICD-10-CM | POA: Insufficient documentation

## 2017-07-25 DIAGNOSIS — Z885 Allergy status to narcotic agent status: Secondary | ICD-10-CM | POA: Diagnosis not present

## 2017-07-25 DIAGNOSIS — J95811 Postprocedural pneumothorax: Secondary | ICD-10-CM

## 2017-07-25 DIAGNOSIS — E039 Hypothyroidism, unspecified: Secondary | ICD-10-CM | POA: Insufficient documentation

## 2017-07-25 DIAGNOSIS — Z8542 Personal history of malignant neoplasm of other parts of uterus: Secondary | ICD-10-CM | POA: Diagnosis not present

## 2017-07-25 DIAGNOSIS — Z90722 Acquired absence of ovaries, bilateral: Secondary | ICD-10-CM | POA: Diagnosis not present

## 2017-07-25 DIAGNOSIS — Z8249 Family history of ischemic heart disease and other diseases of the circulatory system: Secondary | ICD-10-CM | POA: Insufficient documentation

## 2017-07-25 DIAGNOSIS — Z79899 Other long term (current) drug therapy: Secondary | ICD-10-CM | POA: Insufficient documentation

## 2017-07-25 DIAGNOSIS — Z8049 Family history of malignant neoplasm of other genital organs: Secondary | ICD-10-CM | POA: Diagnosis not present

## 2017-07-25 DIAGNOSIS — F329 Major depressive disorder, single episode, unspecified: Secondary | ICD-10-CM | POA: Insufficient documentation

## 2017-07-25 DIAGNOSIS — M1990 Unspecified osteoarthritis, unspecified site: Secondary | ICD-10-CM | POA: Insufficient documentation

## 2017-07-25 DIAGNOSIS — Z90710 Acquired absence of both cervix and uterus: Secondary | ICD-10-CM | POA: Insufficient documentation

## 2017-07-25 DIAGNOSIS — K219 Gastro-esophageal reflux disease without esophagitis: Secondary | ICD-10-CM | POA: Diagnosis not present

## 2017-07-25 DIAGNOSIS — Z923 Personal history of irradiation: Secondary | ICD-10-CM | POA: Diagnosis not present

## 2017-07-25 DIAGNOSIS — R911 Solitary pulmonary nodule: Secondary | ICD-10-CM | POA: Diagnosis present

## 2017-07-25 DIAGNOSIS — C541 Malignant neoplasm of endometrium: Secondary | ICD-10-CM

## 2017-07-25 LAB — CBC
HEMATOCRIT: 37.9 % (ref 36.0–46.0)
Hemoglobin: 12.6 g/dL (ref 12.0–15.0)
MCH: 30.6 pg (ref 26.0–34.0)
MCHC: 33.2 g/dL (ref 30.0–36.0)
MCV: 92 fL (ref 78.0–100.0)
Platelets: 245 10*3/uL (ref 150–400)
RBC: 4.12 MIL/uL (ref 3.87–5.11)
RDW: 13.7 % (ref 11.5–15.5)
WBC: 4.2 10*3/uL (ref 4.0–10.5)

## 2017-07-25 LAB — PROTIME-INR
INR: 0.99
Prothrombin Time: 13 seconds (ref 11.4–15.2)

## 2017-07-25 MED ORDER — LIDOCAINE HCL 1 % IJ SOLN
INTRAMUSCULAR | Status: AC
  Filled 2017-07-25: qty 20

## 2017-07-25 MED ORDER — MIDAZOLAM HCL 2 MG/2ML IJ SOLN
INTRAMUSCULAR | Status: AC | PRN
  Administered 2017-07-25 (×2): 1 mg via INTRAVENOUS

## 2017-07-25 MED ORDER — FENTANYL CITRATE (PF) 100 MCG/2ML IJ SOLN
INTRAMUSCULAR | Status: AC
  Filled 2017-07-25: qty 4

## 2017-07-25 MED ORDER — NALOXONE HCL 0.4 MG/ML IJ SOLN
INTRAMUSCULAR | Status: AC
  Filled 2017-07-25: qty 1

## 2017-07-25 MED ORDER — SODIUM CHLORIDE 0.9 % IV SOLN: Freq: Once | INTRAVENOUS | Status: DC

## 2017-07-25 MED ORDER — MIDAZOLAM HCL 2 MG/2ML IJ SOLN
INTRAMUSCULAR | Status: AC
  Filled 2017-07-25: qty 4

## 2017-07-25 MED ORDER — FLUMAZENIL 0.5 MG/5ML IV SOLN
INTRAVENOUS | Status: AC
  Filled 2017-07-25: qty 5

## 2017-07-25 MED ORDER — FENTANYL CITRATE (PF) 100 MCG/2ML IJ SOLN
INTRAMUSCULAR | Status: AC | PRN
  Administered 2017-07-25 (×2): 50 ug via INTRAVENOUS

## 2017-07-25 NOTE — Discharge Instructions (Addendum)
Needle Biopsy of the Lung, Care After °This sheet gives you information about how to care for yourself after your procedure. Your health care provider may also give you more specific instructions. If you have problems or questions, contact your health care provider. °What can I expect after the procedure? °After the procedure, it is common to have: °· Soreness, pain, and tenderness where a tissue sample was taken (biopsy site). °· A cough. °· A sore throat. ° °Follow these instructions at home: °Biopsy site care °· Follow instructions from your health care provider about when to remove the bandage that was placed on the biopsy site. °· Keep the bandage dry until it has been removed. °· Check your biopsy site every day for signs of infection. Check for: °? More redness, swelling, or pain. °? More fluid or blood. °? Warmth to the touch. °? Pus or a bad smell. °General instructions °· Rest as directed by your health care provider. Ask your health care provider what activities are safe for you. °· Do not take baths, swim, or use a hot tub until your health care provider approves. °· Take over-the-counter and prescription medicines only as told by your health care provider. °· If you have airplane travel scheduled, talk with your health care provider about when it is safe for you to travel by airplane. °· It is up to you to get the results of your procedure. Ask your health care provider, or the department that is doing the procedure, when your results will be ready. °· Keep all follow-up visits as told by your health care provider. This is important. °Contact a health care provider if: °· You have more redness, swelling, or pain around your biopsy site. °· You have more fluid or blood coming from your biopsy site. °· Your biopsy site feels warm to the touch. °· You have pus or a bad smell coming from your biopsy site. °· You have a fever. °· You have pain that does not get better with medicine. °Get help right away  if: °· You have problems breathing. °· You have chest pain. °· You cough up blood. °· You faint. °· You have a fast heart rate. °Summary °· After a needle biopsy of the lung, it is common to have a cough, a sore throat, or soreness, pain, and tenderness where a tissue sample was taken (biopsy site). °· You should check your biopsy area every day for signs of infection, including pus or a bad smell, warmth, more fluid or blood, or more redness, swelling, or pain. °· You should not take baths, swim, or use a hot tub until your health care provider approves. °· It is up to you to get the results of your procedure. Ask your health care provider, or the department that is doing the procedure, when your results will be ready. °This information is not intended to replace advice given to you by your health care provider. Make sure you discuss any questions you have with your health care provider. °Document Released: 01/07/2007 Document Revised: 02/01/2016 Document Reviewed: 02/01/2016 °Elsevier Interactive Patient Education © 2017 [employer] Inc[employer] ° °Moderate Conscious Sedation, Adult, Care After °These instructions provide you with information about caring for yourself after your procedure. Your health care provider may also give you more specific instructions. Your treatment has been planned according to current medical practices, but problems sometimes occur. Call your health care provider if you have any problems or questions after your procedure. °What can I expect after the   procedure? °After your procedure, it is common: °· To feel sleepy for several hours. °· To feel clumsy and have poor balance for several hours. °· To have poor judgment for several hours. °· To vomit if you eat too soon. ° °Follow these instructions at home: °For at least 24 hours after the procedure: ° °· Do not: °? Participate in activities where you could fall or become injured. °? Drive. °? Use heavy machinery. °? Drink alcohol. °? Take sleeping  pills or medicines that cause drowsiness. °? Make important decisions or sign legal documents. °? Take care of children on your own. °· Rest. °Eating and drinking °· Follow the diet recommended by your health care provider. °· If you vomit: °? Drink water, juice, or soup when you can drink without vomiting. °? Make sure you have little or no nausea before eating solid foods. °General instructions °· Have a responsible adult stay with you until you are awake and alert. °· Take over-the-counter and prescription medicines only as told by your health care provider. °· If you smoke, do not smoke without supervision. °· Keep all follow-up visits as told by your health care provider. This is important. °Contact a health care provider if: °· You keep feeling nauseous or you keep vomiting. °· You feel light-headed. °· You develop a rash. °· You have a fever. °Get help right away if: °· You have trouble breathing. °This information is not intended to replace advice given to you by your health care provider. Make sure you discuss any questions you have with your health care provider. °Document Released: 12/31/2012 Document Revised: 08/15/2015 Document Reviewed: 07/02/2015 °Elsevier Interactive Patient Education © 2018 Elsevier Inc. ° °

## 2017-07-25 NOTE — Procedures (Signed)
Interventional Radiology Procedure Note  Procedure: CT guided biopsy of RLL lung nodule Complications: None Recommendations: - Bedrest until CXR cleared.  Minimize talking, coughing or otherwise straining.  - 1 hour right decubitus position until CXR - Follow up 1 hr CXR pending  - NPO until CXR cleared  Signed,  Corrie Mckusick, DO

## 2017-07-25 NOTE — H&P (Signed)
Chief Complaint: Patient was seen in consultation today for right lung lesion biopsy at the request of [place]  Referring Physician(s): Kinard,James  Supervising Physician: Corrie Mckusick  Patient Status: [facility]  History of Present Illness: Ashley Villegas is a 67 y.o. female   Hx endometrial cancer Was having abd pain and groin area Work up included CT Abd/pelvis: incidental finding of right lung nodule  4/12 CT Chest: IMPRESSION: Several bilateral pulmonary nodules measuring up to 11 mm, highly suspicious for pulmonary metastases.  Now scheduled for biopsy of same    Past Medical History:  Diagnosis Date  . Anxiety   . Arthritis   . Cancer [facility])    endometrial cancer  . Complication of anesthesia   . Depression   . Family history of uterine cancer   . GERD (gastroesophageal reflux disease)   . History of radiation therapy 11/08/2016-12/27/2016   pelvis 45 Gy in 25 fractions, pelvis boost 14 gy in 7 fractions  . Hypothyroidism    on synthroid   . PONV (postoperative nausea and vomiting)     Past Surgical History:  Procedure Laterality Date  . CESAREAN SECTION    . DIAGNOSTIC LAPAROSCOPY     fibroid tumors on her ovaries   . DILATION AND CURETTAGE OF UTERUS    . HYSTEROSCOPY W/D&C N/A 09/15/2014   Procedure: DILATATION AND CURETTAGE /HYSTEROSCOPY/MYOSURE RESECTION OF POLYP;  Surgeon: Janyth Pupa, DO;  Location: [facility];  Service: Gynecology;  Laterality: N/A;  . KNEE SURGERY    . LYMPH NODE DISSECTION N/A 10/26/2014   Procedure: POSS LYMPHADENECTOMY ;  Surgeon: [name], MD;  Location: [facility];  Service: Gynecology;  Laterality: N/A;  . ROBOTIC ASSISTED TOTAL HYSTERECTOMY WITH BILATERAL SALPINGO OOPHERECTOMY Bilateral 10/26/2014   Procedure: ROBOTIC ASSISTED TOTAL HYSTERECTOMY WITH BILATERAL SALPINGO OOPHORECTOMY;  Surgeon: [name], MD;  Location: [facility];  Service: Gynecology;  Laterality: Bilateral;  . TUBAL LIGATION    . WISDOM TOOTH  EXTRACTION      Allergies: Hydrocodone  Medications: Prior to Admission medications   Medication Sig Start Date End Date Taking? Authorizing Provider  Calcium Carb-Cholecalciferol (CALCIUM 600 + D PO) Take 1 tablet by mouth daily.   Yes [provider]  FLUoxetine (PROZAC) 40 MG capsule Take 40 mg by mouth daily.   Yes [provider]  levothyroxine (SYNTHROID, LEVOTHROID) 88 MCG tablet Take 88 mcg by mouth daily before breakfast. Patient takes sat and sun   Yes [provider]  naproxen sodium (ALEVE) 220 MG tablet Take 220 mg by mouth daily as needed (pain).   Yes [provider]  furosemide (LASIX) 40 MG tablet Take 40 mg by mouth daily as needed for fluid.     [provider]     Family History  Problem Relation Age of Onset  . Breast cancer Maternal Aunt        dx late 50's/60's, died in 73's  . Uterine cancer Mother 24  . Heart failure Mother   . Dementia Father   . Heart attack Paternal Grandfather 62  . Other Sister 23       had abnromal finding on mammogram, unsure if cancer. Was told to 'remove part of of body'    Social History   Socioeconomic History  . Marital status: Single    Spouse name: Not on file  . Number of children: 2  . Years of education: Not on file  . Highest education level: Not on file  Occupational History  .  Not on file  Social Needs  . Financial resource strain: Not on file  . Food insecurity:    Worry: Not on file    Inability: Not on file  . Transportation needs:    Medical: Not on file    Non-medical: Not on file  Tobacco Use  . Smoking status: Former Smoker    Packs/day: 0.50    Years: 13.00    Pack years: 6.50    Types: Cigarettes    Last attempt to quit: 10/20/2007    Years since quitting: 9.7  . Smokeless tobacco: Never Used  Substance and Sexual Activity  . Alcohol use: No  . Drug use: No  . Sexual activity: Not on file  Lifestyle  . Physical activity:    Days per week: Not  on file    Minutes per session: Not on file  . Stress: Not on file  Relationships  . Social connections:    Talks on phone: Not on file    Gets together: Not on file    Attends religious service: Not on file    Active member of club or organization: Not on file    Attends meetings of clubs or organizations: Not on file    Relationship status: Not on file  Other Topics Concern  . Not on file  Social History Narrative  . Not on file     Review of Systems: A 12 point ROS discussed and pertinent positives are indicated in the HPI above.  All other systems are negative.  Review of Systems  Constitutional: Negative for activity change, fatigue and fever.  Respiratory: Negative for cough and shortness of breath.   Cardiovascular: Negative for chest pain.  Gastrointestinal: Positive for abdominal pain.  Musculoskeletal: Negative for back pain and gait problem.  Neurological: Negative for weakness.  Psychiatric/Behavioral: Negative for behavioral problems and confusion.    Vital Signs: BP (!) 140/49 (BP Location: Right Arm)   Pulse 64   Temp 98.5 F (36.9 C) (Oral)   Ht 5' (1.524 m)   Wt 158 lb (71.7 kg)   SpO2 99%   BMI 30.86 kg/m   Physical Exam  Constitutional: She is oriented to person, place, and time.  Cardiovascular: Normal rate and regular rhythm.  Pulmonary/Chest: Effort normal and breath sounds normal.  Abdominal: Soft. Bowel sounds are normal.  Musculoskeletal: Normal range of motion.  Neurological: She is alert and oriented to person, place, and time.  Skin: Skin is warm and dry.  Psychiatric: She has a normal mood and affect. Her behavior is normal. Judgment and thought content normal.  Nursing note and vitals reviewed.   Imaging: Ct Chest W Contrast  Result Date: 07/06/2017 CLINICAL DATA:  Right lung nodule on recent abdomen CT. Endometrial carcinoma. EXAM: CT CHEST WITH CONTRAST TECHNIQUE: Multidetector CT imaging of the chest was performed during  intravenous contrast administration. CONTRAST:  28mL OMNIPAQUE IOHEXOL 300 MG/ML  SOLN COMPARISON:  Abdomen CT on 06/12/2017 FINDINGS: Cardiovascular:  No acute findings. Aortic atherosclerosis. Mediastinum/Nodes: No masses or pathologically enlarged lymph nodes identified. Lungs/Pleura: 11 mm solid nodule is seen in the right lower lobe on image 89/7. 4 mm noncalcified nodule is seen in the right upper lobe on image 33/7. An 8 mm pulmonary nodule with central focus of cavitation is seen in the left lower lobe on image 70/7. These are suspicious for pulmonary metastases. No evidence of pulmonary consolidation or pleural effusion. Upper Abdomen: Unremarkable. Stable small cyst in posterior right hepatic lobe. Normal  adrenal glands. Musculoskeletal:  No suspicious bone lesions. IMPRESSION: Several bilateral pulmonary nodules measuring up to 11 mm, highly suspicious for pulmonary metastases. No evidence of lymphadenopathy or pleural effusion. Aortic Atherosclerosis (ICD10-I70.0). Electronically Signed   By: Earle Gell M.D.   On: 07/06/2017 09:22    Labs:  CBC: Recent Labs    05/27/17 1607  WBC 5.7  HGB 13.3  HCT 38.9  PLT 227    COAGS: No results for input(s): INR, APTT in the last 8760 hours.  BMP: Recent Labs    10/17/16 1353 05/27/17 1607  NA  --  141  K  --  3.8  CL  --  106  CO2  --  26  GLUCOSE  --  91  BUN 18.7 13  CALCIUM  --  10.5*  CREATININE 0.8 0.80  GFRNONAA  --  >60  GFRAA  --  >60    LIVER FUNCTION TESTS: No results for input(s): BILITOT, AST, ALT, ALKPHOS, PROT, ALBUMIN in the last 8760 hours.  TUMOR MARKERS: No results for input(s): AFPTM, CEA, CA199, CHROMGRNA in the last 8760 hours.  Assessment and Plan:  Hx Endometrial cancer Groin pain New Right lung lesion Scheduled for biopsy of same Risks and benefits discussed with the patient including, but not limited to bleeding, hemoptysis, respiratory failure requiring intubation, infection, pneumothorax  requiring chest tube placement, stroke from air embolism or even death.  All of the patient's questions were answered, patient is agreeable to proceed. Consent signed and in chart.   Thank you for this interesting consult.  I greatly enjoyed meeting Irasema Chalk and look forward to participating in their care.  A copy of this report was sent to the requesting provider on this date.  Electronically Signed: Lavonia Drafts, PA-C 07/25/2017, 10:24 AM   I spent a total of  30 Minutes   in face to face in clinical consultation, greater than 50% of which was counseling/coordinating care for right lung lesion biopsy

## 2017-07-26 ENCOUNTER — Telehealth: Payer: Self-pay | Admitting: Oncology

## 2017-07-26 DIAGNOSIS — C541 Malignant neoplasm of endometrium: Secondary | ICD-10-CM

## 2017-07-26 NOTE — Telephone Encounter (Addendum)
Patient called back. Discussed results of MSI testing and that a genetic consultation is recommended where this can be discussed in more details and further testing can be ordered.  Patient said she would be interested in having genetic counseling.  Referral placed for genetic counseling.

## 2017-07-26 NOTE — Telephone Encounter (Signed)
Left a message for patient regarding genetics referral/MSI testing.  Requested a return call.

## 2017-07-26 NOTE — Addendum Note (Signed)
Addended by: Jacqulyn Liner on: 07/26/2017 04:37 PM   Modules accepted: Orders

## 2017-07-30 ENCOUNTER — Encounter: Payer: Self-pay | Admitting: Hematology and Oncology

## 2017-07-30 ENCOUNTER — Inpatient Hospital Stay: Payer: 59 | Attending: Hematology and Oncology | Admitting: Hematology and Oncology

## 2017-07-30 ENCOUNTER — Telehealth: Payer: Self-pay | Admitting: Oncology

## 2017-07-30 DIAGNOSIS — C541 Malignant neoplasm of endometrium: Secondary | ICD-10-CM | POA: Insufficient documentation

## 2017-07-30 DIAGNOSIS — K219 Gastro-esophageal reflux disease without esophagitis: Secondary | ICD-10-CM | POA: Insufficient documentation

## 2017-07-30 DIAGNOSIS — F329 Major depressive disorder, single episode, unspecified: Secondary | ICD-10-CM | POA: Insufficient documentation

## 2017-07-30 DIAGNOSIS — Z79899 Other long term (current) drug therapy: Secondary | ICD-10-CM | POA: Insufficient documentation

## 2017-07-30 DIAGNOSIS — R1031 Right lower quadrant pain: Secondary | ICD-10-CM | POA: Diagnosis not present

## 2017-07-30 DIAGNOSIS — Z923 Personal history of irradiation: Secondary | ICD-10-CM | POA: Insufficient documentation

## 2017-07-30 DIAGNOSIS — Z7189 Other specified counseling: Secondary | ICD-10-CM

## 2017-07-30 DIAGNOSIS — C7800 Secondary malignant neoplasm of unspecified lung: Secondary | ICD-10-CM | POA: Diagnosis not present

## 2017-07-30 DIAGNOSIS — F32A Depression, unspecified: Secondary | ICD-10-CM | POA: Insufficient documentation

## 2017-07-30 DIAGNOSIS — Z5112 Encounter for antineoplastic immunotherapy: Secondary | ICD-10-CM | POA: Insufficient documentation

## 2017-07-30 DIAGNOSIS — E039 Hypothyroidism, unspecified: Secondary | ICD-10-CM | POA: Insufficient documentation

## 2017-07-30 DIAGNOSIS — Z87891 Personal history of nicotine dependence: Secondary | ICD-10-CM | POA: Insufficient documentation

## 2017-07-30 NOTE — Assessment & Plan Note (Signed)
Her MSI testing came back abnormal She was seen by genetic counselor last year I will reach out to genetic counselor to see what the appropriate test needed The patient is interested to pursue further genetic testing

## 2017-07-30 NOTE — Telephone Encounter (Signed)
Called patient to see if she would be able to see Dr. Alvy Bimler today or tomorrow morning.  Patient said she can be here at 1:15 today for a 1:45 appointment.

## 2017-07-30 NOTE — Assessment & Plan Note (Signed)
The patient is emotional and appears depressed She is already on Prozac I recommend referral to see  psychiatrist or psychologist Her daughter will try to arrange that for her

## 2017-07-30 NOTE — Progress Notes (Signed)
[place] NOTE  Patient Care Team: [name], MD as PCP - General (Internal Medicine)  ASSESSMENT & PLAN:  Endometrial cancer [facility]) I have reviewed multiple imaging studies with the patient and family The patient has stage IV disease The goes of treatment is strictly palliative in nature Disease burden is low The patient would like to think about treatment options We reviewed the current guidelines Treatment options included combination chemotherapy of carboplatin and Taxol, combination chemotherapy of everolimus with letrozole or immunotherapy with pembrolizumab due to abnormal MSI testing We discussed briefly expected risk, side effects of treatment and she will call me once she is ready to make decision about treatment options We also discussed potential referral to tertiary center for clinical trial but she declines She is interested to get enroll in local research study for specimen only research  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[facility]) Her MSI testing came back abnormal She was seen by genetic counselor last year I will reach out to genetic counselor to see what the appropriate test needed The patient is interested to pursue further genetic testing  Depression The patient is emotional and appears depressed She is already on Prozac I recommend referral to see  psychiatrist or psychologist Her daughter will try to arrange that for her  Goals of care, counseling/discussion We have extensive discussion about goals of care She understood treatment goals is strictly palliative in nature   Metastasis to lung [facility]) She tolerated recent biopsy well She is not symptomatic with chest pain or shortness of breath or cough  Observe only  Groin pain, right She has chronic intermittent right groin pain since last year I recommend over-the-counter analgesics as tolerated Recent CT imaging from March 2019 showed no evidence of local cancer recurrence in her  pelvic region   No orders of the defined types were placed in this encounter.    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 She currently lives with her son.  She works from home.  HISTORY OF PRESENTING ILLNESS:  Ashley Villegas 67 y.o. female is here because of recent biopsy-proven metastatic disease to the lung. Her cancer diagnosis started in 2016 when she presented with postmenopausal bleeding. I have reviewed her chart and materials related to her cancer extensively and collaborated history with the patient. Summary of oncologic history is as follows: Oncology History   Abnormal MSI  Endometrioid     Endometrial cancer ([place])   09/15/2014 Pathology Results    Endometrium, curettage - ENDOMETRIAL ADENOCARCINOMA. - SEE COMMENT. Microscopic Comment Although definitive characterization is best performed on resection specimen, as sampled, the endometrial adenocarcinoma appears to be endometrioid subtype, FIGO grade 1.       09/15/2014 Surgery    PreOp: postmenopausal bleeding, cervical stenosis PostOp: same and uterine polyp Procedure:  Hysteroscopy, Dilation and Curettage, Myosure polypectomy Surgeon: Dr. Janyth Pupa  Findings:8cm uterus with thickened polypoid-like endometrium, large 2cm irregular appearing polyp  Specimens: 1) endometrial curettings      10/26/2014 Pathology Results    1. Uterus +/- tubes/ovaries, neoplastic - INVASIVE ADENOCARCINOMA, ENDOMETRIOID TYPE, SEE COMMENT. - TUMOR INVOLVES LESS THAN ONE HALF MYOMETRIAL THICKNESS. - TUMOR INVOLVES UTERINE ADENOMYOSIS. - BENIGN LEIOMYOMATA (UP TO 2.5 CM). - BENIGN CERVICAL MUCOSA; NEGATIVE FOR INTRAEPITHELIAL LESION OR MALIGNANCY. - BENIGN RIGHT AND LEFT OVARIES; NEGATIVE FOR ATYPIA OR MALIGNANCY. - BENIGN RIGHT AND LEFT FALLOPIAN TUBE; NEGATIVE  FOR  ATYPIA OR MALIGNANCY. - BENIGN PARATUBAL CYSTS; NEGATIVE FOR ATYPIA OR MALIGNANCY. - SEE TUMOR SYNOPTIC TEMPLATE BELOW. 2. Lymph nodes, regional resection, right pelvic - FOUR LYMPH NODES, NEGATIVE FOR TUMOR (0/4). 3. Lymph nodes, regional resection, left pelvic - FOUR LYMPH NODES, NEGATIVE FOR TUMOR (0/4). Microscopic Comment 1. ONCOLOGY TABLE-UTERUS, CARCINOMA OR CARCINOSARCOMA Specimen: Uterus and bilateral fallopian tubes and ovaries. Procedure: Hysterectomy and bilateral salpingo-oophorectomy. Lymph node sampling performed: Yes. Specimen integrity: Intact. Maximum tumor size: 4.0 cm (tumor involved entire endometrium) Histologic type: Adenocarcinoma, endometrioid type. Grade: 1 Myometrial invasion: 0.5 cm where myometrium is 1.5 cm in thickness Cervical stromal involvement: Absent. Extent of involvement of other organs: None Lymph - vascular invasion: Absent. Peritoneal washings: N/A Lymph nodes: # examined - 8 ; # positive - 0 Pelvic lymph nodes: N/A involved of N/A lymph nodes. Para-aortic lymph nodes: N/A involved of N/A lymph nodes. Other (specify involvement and site): N/A TNM code: pT1a, pN0 FIGO Stage (based on pathologic findings, needs clinical correlation): N/A Comments: None MSI testing abnormal      10/26/2014 Surgery    Surgery: Total robotic hysterectomy, bilateral salpingo-oophorectomy, bilateral pelvic lymph node dissection.   Surgeons:  Lucita Lora. Alycia Rossetti, MD; Lahoma Crocker, MD   Pathology: Uterus, cervix, bilateral tubes and ovaries, bilateral pelvic lymph nodes to pathology.   Operative findings: Omentum adherent to midline vertical incision. Fibroid uterus. Normal adnexa. Adhesions of anterior bladder to uterus. Frozen section with grade 1 cancer focally involving adenomyosis, minimal myometrial invasion.      11/17/2014 Imaging    Ct abdomen 1. Status post hysterectomy. Pelvic edema which is greater than typically seen 3 weeks postop.  Suspicious for postoperative infection. 2. Bladder wall thickening and irregularity. Although this could be partially due to underdistention, Concurrent cystitis cannot be excluded. 3. Bilateral fluid density lesions along the pelvic sidewalls. Favored to represent seromas or lymphangiomas. No specific features to suggest abscess. If the patient's symptoms persist after appropriate antibiotic therapy, aspiration of the largest left pelvic sidewall "Lesion" should be considered. 4. Suspicion of mild hepatic steatosis. Indeterminate right hepatic lobe lesion. If definitive characterization is desired in this patient with history of primary malignancy, nonemergent pre and post contrast abdominal MRI could be performed. 5. Hiatal hernia.      11/25/2014 Imaging    1. Persistent bilateral pelvic sidewall fluid collections, left greater than right, with left-sided inflammatory changes. 2. Technically successful 12 French left pelvic abscess drain catheter placement. A sample of the aspirate was sent for Gram stain, culture and sensitivity.      12/08/2014 Imaging    CT abdomen and pelvis 1. Complete resolution of dominant fluid collection within the left hemipelvis following percutaneous drainage catheter placement. This percutaneous drainage catheter was subsequent removed intact at the patient's bedside. 2. Continued decrease in size of bilobed fluid collections within the right hemipelvis with superficial component measuring 1.6 cm and posterior component measuring 2.6 cm, indeterminate though both favored to represent evolving seromas. 3. Unchanged indeterminate approximately 1.2 cm hepatic lesion. Further evaluation with contrast-enhanced abdominal MRI could be performed as clinically indicated. 4. Colonic diverticulosis without evidence of diverticulitis.      10/19/2016 Imaging    New 2.6 cm soft tissue mass involving right vaginal cuff, consistent with locally recurrent carcinoma.  No other  sites of metastatic disease identified.  Colonic diverticulosis, without radiographic evidence of diverticulitis.  Mild hepatic steatosis.       10/24/2016 Relapse/Recurrence    Vaginal cuff recurrence. No evidence  of distant disease.      10/24/2016 Pathology Results    Vagina, biopsy, right apex - ADENOCARCINOMA - SEE COMMENT Microscopic Comment The morphologic features are consistent with the patient's previously diagnosed endometrioid adenocarcinoma      11/08/2016 - 12/27/2016 Radiation Therapy    45 Gy in 25 fractions of 1.8 Gy. The residual pelvic mass was boosted to 14 Gy in 7 fractions of 2 Gy. No intracavitary lesion seen at completion of pelvic fields to boost with brachytherapy.      06/12/2017 Imaging    CT abdomen and pelvis 1. New 1.1 by 0.8 cm right lower lobe pulmonary nodule, concerning for pulmonary metastatic disease. Possibilities for further assessment include biopsy or nuclear medicine PET-CT to assess this lesion and the rest of the neck/chest/abdomen/pelvis for other potential hypermetabolic lesions. 2. The previous soft tissue mass along the right vaginal cuff is no longer present. 3. Stable and likely benign right hepatic lobe hypodense lesion. 4. Other imaging findings of potential clinical significance: Sigmoid colon diverticulosis. Aortic Atherosclerosis (ICD10-I70.0). Multilevel lumbar degenerative disc disease. Mild cardiomegaly. Small type 1 hiatal hernia.      07/05/2017 Imaging    Ct chest Several bilateral pulmonary nodules measuring up to 11 mm, highly suspicious for pulmonary metastases.   No evidence of lymphadenopathy or pleural effusion.  Aortic Atherosclerosis (ICD10-I70.0).      07/25/2017 Imaging    Status post CT-guided lung nodule biopsy. Tissue specimen sent to pathology for complete histopathologic analysis.      07/25/2017 Pathology Results    Lung, needle/core biopsy(ies), RLL - METASTATIC ADENOCARCINOMA - SEE  COMMENT Microscopic Comment By immunohistochemistry, the neoplastic cells are positive for Pax-8 and ER but negative for TTF-1. Overall, this immunoprofile is consistent with metastasis from the patient's known endometrioid adenocarcinoma      07/30/2017 Cancer Staging    Staging form: Corpus Uteri - Carcinoma and Carcinosarcoma, AJCC 8th Edition - Clinical: Stage IVB (rcT1, cN0, pM1) - Signed by Heath Lark, MD on 07/30/2017      The patient continues to have intermittent right groin pain that comes and goes, sometimes associated with bowel movement.  The pain is localized in the groin region, without further radiation, sometimes could last from hours to 1 day She does not take analgesics Currently, she is not in pain Since she completed radiation treatment, she has minimum occasional loose stool She denies vaginal bleeding. She tolerated recent lung biopsy well.  She denies cough, chest pain or shortness of breath The patient has history of depression and has been on Prozac for at least 5 to 6 years. Her depression seems to be a little worse recently due to diagnosis of recurrent disease She denies suicidal ideation She had mildly reduced appetite but no reported weight loss   MEDICAL HISTORY:  Past Medical History:  Diagnosis Date  . Anxiety   . Arthritis   . Cancer [facility])    endometrial cancer  . Complication of anesthesia   . Depression   . Family history of uterine cancer   . GERD (gastroesophageal reflux disease)   . History of radiation therapy 11/08/2016-12/27/2016   pelvis 45 Gy in 25 fractions, pelvis boost 14 gy in 7 fractions  . Hypothyroidism    on synthroid   . PONV (postoperative nausea and vomiting)     SURGICAL HISTORY: Past Surgical History:  Procedure Laterality Date  . CESAREAN SECTION    . COLONOSCOPY    . DIAGNOSTIC LAPAROSCOPY  fibroid tumors on her ovaries   . DILATION AND CURETTAGE OF UTERUS    . HYSTEROSCOPY W/D&C N/A 09/15/2014   Procedure:  DILATATION AND CURETTAGE /HYSTEROSCOPY/MYOSURE RESECTION OF POLYP;  Surgeon: Janyth Pupa, DO;  Location: [facility];  Service: Gynecology;  Laterality: N/A;  . KNEE SURGERY    . LYMPH NODE DISSECTION N/A 10/26/2014   Procedure: POSS LYMPHADENECTOMY ;  Surgeon: [name], MD;  Location: [facility];  Service: Gynecology;  Laterality: N/A;  . ROBOTIC ASSISTED TOTAL HYSTERECTOMY WITH BILATERAL SALPINGO OOPHERECTOMY Bilateral 10/26/2014   Procedure: ROBOTIC ASSISTED TOTAL HYSTERECTOMY WITH BILATERAL SALPINGO OOPHORECTOMY;  Surgeon: [name], MD;  Location: [facility];  Service: Gynecology;  Laterality: Bilateral;  . TUBAL LIGATION    . WISDOM TOOTH EXTRACTION      SOCIAL HISTORY: Social History   Socioeconomic History  . Marital status: Single    Spouse name: Not on file  . Number of children: 2  . Years of education: Not on file  . Highest education level: Not on file  Occupational History  . Occupation: Therapist, art  Social Needs  . Financial resource strain: Not on file  . Food insecurity:    Worry: Not on file    Inability: Not on file  . Transportation needs:    Medical: Not on file    Non-medical: Not on file  Tobacco Use  . Smoking status: Former Smoker    Packs/day: 0.50    Years: 13.00    Pack years: 6.50    Types: Cigarettes    Last attempt to quit: 10/20/2007    Years since quitting: 9.7  . Smokeless tobacco: Never Used  Substance and Sexual Activity  . Alcohol use: No  . Drug use: No  . Sexual activity: Not on file  Lifestyle  . Physical activity:    Days per week: Not on file    Minutes per session: Not on file  . Stress: Not on file  Relationships  . Social connections:    Talks on phone: Not on file    Gets together: Not on file    Attends religious service: Not on file    Active member of club or organization: Not on file    Attends meetings of clubs or organizations: Not on file    Relationship status: Not on file  . Intimate partner violence:    Fear  of current or ex partner: Not on file    Emotionally abused: Not on file    Physically abused: Not on file    Forced sexual activity: Not on file  Other Topics Concern  . Not on file  Social History Narrative  . Not on file    FAMILY HISTORY: Family History  Problem Relation Age of Onset  . Breast cancer Maternal Aunt        dx late 50's/60's, died in 51's  . Uterine cancer Mother 56  . Heart failure Mother   . Dementia Father   . Heart attack Paternal Grandfather 14  . Other Sister 53       had abnromal finding on mammogram, unsure if cancer. Was told to 'remove part of of body'    ALLERGIES:  is allergic to hydrocodone.  MEDICATIONS:  Current Outpatient Medications  Medication Sig Dispense Refill  . Calcium Carb-Cholecalciferol (CALCIUM 600 + D PO) Take 1 tablet by mouth daily.    Marland Kitchen FLUoxetine (PROZAC) 40 MG capsule Take 40 mg by mouth daily.    . furosemide (LASIX)  40 MG tablet Take 40 mg by mouth daily as needed for fluid.     Marland Kitchen levothyroxine (SYNTHROID, LEVOTHROID) 88 MCG tablet Take 88 mcg by mouth daily before breakfast. Patient takes sat and sun    . naproxen sodium (ALEVE) 220 MG tablet Take 220 mg by mouth daily as needed (pain).     No current facility-administered medications for this visit.     REVIEW OF SYSTEMS:   Constitutional: Denies fevers, chills or abnormal night sweats Eyes: Denies blurriness of vision, double vision or watery eyes Ears, nose, mouth, throat, and face: Denies mucositis or sore throat Respiratory: Denies cough, dyspnea or wheezes Cardiovascular: Denies palpitation, chest discomfort or lower extremity swelling Skin: Denies abnormal skin rashes Lymphatics: Denies new lymphadenopathy or easy bruising Neurological:Denies numbness, tingling or new weaknesses Behavioral/Psych: Mood is stable, no new changes  All other systems were reviewed with the patient and are negative.  PHYSICAL EXAMINATION: ECOG PERFORMANCE STATUS: 1 - Symptomatic  but completely ambulatory  Vitals:   07/30/17 1352  BP: 128/64  Pulse: (!) 58  Resp: 18  Temp: 98.4 F (36.9 C)  SpO2: 100%   Filed Weights   07/30/17 1352  Weight: 162 lb 8 oz (73.7 kg)    GENERAL:alert, no distress and comfortable SKIN: skin color, texture, turgor are normal, no rashes or significant lesions EYES: normal, conjunctiva are pink and non-injected, sclera clear OROPHARYNX:no exudate, no erythema and lips, buccal mucosa, and tongue normal  NECK: supple, thyroid normal size, non-tender, without nodularity LYMPH:  no palpable lymphadenopathy in the cervical, axillary or inguinal LUNGS: clear to auscultation and percussion with normal breathing effort HEART: regular rate & rhythm and no murmurs and no lower extremity edema ABDOMEN:abdomen soft, non-tender and normal bowel sounds Musculoskeletal:no cyanosis of digits and no clubbing  PSYCH: alert & oriented x 3 with fluent speech NEURO: no focal motor/sensory deficits  LABORATORY DATA:  I have reviewed the data as listed Lab Results  Component Value Date   WBC 4.2 07/25/2017   HGB 12.6 07/25/2017   HCT 37.9 07/25/2017   MCV 92.0 07/25/2017   PLT 245 07/25/2017   Recent Labs    10/17/16 1353 05/27/17 1607  NA  --  141  K  --  3.8  CL  --  106  CO2  --  26  GLUCOSE  --  91  BUN 18.7 13  CREATININE 0.8 0.80  CALCIUM  --  10.5*  GFRNONAA  --  >60  GFRAA  --  >60    RADIOGRAPHIC STUDIES: I have personally reviewed the radiological images as listed and agreed with the findings in the report. Ct Chest W Contrast  Result Date: 07/06/2017 CLINICAL DATA:  Right lung nodule on recent abdomen CT. Endometrial carcinoma. EXAM: CT CHEST WITH CONTRAST TECHNIQUE: Multidetector CT imaging of the chest was performed during intravenous contrast administration. CONTRAST:  37m OMNIPAQUE IOHEXOL 300 MG/ML  SOLN COMPARISON:  Abdomen CT on 06/12/2017 FINDINGS: Cardiovascular:  No acute findings. Aortic atherosclerosis.  Mediastinum/Nodes: No masses or pathologically enlarged lymph nodes identified. Lungs/Pleura: 11 mm solid nodule is seen in the right lower lobe on image 89/7. 4 mm noncalcified nodule is seen in the right upper lobe on image 33/7. An 8 mm pulmonary nodule with central focus of cavitation is seen in the left lower lobe on image 70/7. These are suspicious for pulmonary metastases. No evidence of pulmonary consolidation or pleural effusion. Upper Abdomen: Unremarkable. Stable small cyst in posterior right hepatic lobe. Normal  adrenal glands. Musculoskeletal:  No suspicious bone lesions. IMPRESSION: Several bilateral pulmonary nodules measuring up to 11 mm, highly suspicious for pulmonary metastases. No evidence of lymphadenopathy or pleural effusion. Aortic Atherosclerosis (ICD10-I70.0). Electronically Signed   By: Earle Gell M.D.   On: 07/06/2017 09:22   Ct Biopsy  Result Date: 07/25/2017 INDICATION: 67 year old female with a history gynecologic malignancy and lung nodules EXAM: CT-GUIDED LUNG BIOPSY MEDICATIONS: None. ANESTHESIA/SEDATION: Moderate (conscious) sedation was employed during this procedure. A total of Versed 2.0 mg and Fentanyl 100 mcg was administered intravenously. Moderate Sedation Time: 14 minutes. The patient's level of consciousness and vital signs were monitored continuously by radiology nursing throughout the procedure under my direct supervision. FLUOROSCOPY TIME:  CT COMPLICATIONS: CT none PROCEDURE: The procedure, risks, benefits, and alternatives were explained to the patient and the patient's family. Specific risks that were addressed included bleeding, infection, pneumothorax, need for further procedure including chest tube placement, chance of delayed pneumothorax or hemorrhage, hemoptysis, nondiagnostic sample, cardiopulmonary collapse, death. Questions regarding the procedure were encouraged and answered. The patient understands and consents to the procedure. Patient was positioned  in the right decubitus position on the CT gantry table and a scout CT of the chest was performed for planning purposes. Once angle of approach was determined, the skin and subcutaneous tissues this scan was prepped and draped in the usual sterile fashion, and a sterile drape was applied covering the operative field. A sterile gown and sterile gloves were used for the procedure. Local anesthesia was provided with 1% Lidocaine. The skin and subcutaneous tissues were infiltrated 1% lidocaine for local anesthesia, and a small stab incision was made with an 11 blade scalpel. Using CT guidance, a 17 gauge trocar needle was advanced into the right lower lobetarget. After confirmation of the tip, separate 18 gauge core biopsies were performed. The initial device failed after 3 passes and a second device was required for adequate samples. These were placed into solution for transportation to the lab. Biosentry Device was deployed. A final CT image was performed. Patient tolerated the procedure well and remained hemodynamically stable throughout. No complications were encountered and no significant blood loss was encounter IMPRESSION: Status post CT-guided lung nodule biopsy. Tissue specimen sent to pathology for complete histopathologic analysis. Signed, Dulcy Fanny. Earleen Newport, DO [facility] Radiology Electronically Signed   By: Corrie Mckusick D.O.   On: 07/25/2017 12:32   Dg Chest Port 1 View  Result Date: 07/25/2017 CLINICAL DATA:  Status post right lung biopsy. EXAM: PORTABLE CHEST 1 VIEW COMPARISON:  Radiographs of October 20, 2014. FINDINGS: The heart size and mediastinal contours are within normal limits. Both lungs are clear. No pneumothorax or pleural effusion is noted. The visualized skeletal structures are unremarkable. IMPRESSION: No pneumothorax is noted. No acute cardiopulmonary abnormality seen. Electronically Signed   By: Marijo Conception, M.D.   On: 07/25/2017 13:38     I spent 55 minutes counseling the patient face to face. The total time spent in the appointment was 60 minutes and more than 50% was on counseling.  All questions were answered. The patient knows to call the clinic with any problems, questions or concerns.  Heath Lark, MD 07/30/2017 3:16 PM

## 2017-07-30 NOTE — Assessment & Plan Note (Signed)
She has chronic intermittent right groin pain since last year I recommend over-the-counter analgesics as tolerated Recent CT imaging from March 2019 showed no evidence of local cancer recurrence in her pelvic region

## 2017-07-30 NOTE — Assessment & Plan Note (Addendum)
I have reviewed multiple imaging studies with the patient and family The patient has stage IV disease The goes of treatment is strictly palliative in nature Disease burden is low The patient would like to think about treatment options We reviewed the current guidelines Treatment options included combination chemotherapy of carboplatin and Taxol, combination chemotherapy of everolimus with letrozole or immunotherapy with pembrolizumab due to abnormal MSI testing We discussed briefly expected risk, side effects of treatment and she will call me once she is ready to make decision about treatment options We also discussed potential referral to tertiary center for clinical trial but she declines She is interested to get enroll in local research study for specimen only research

## 2017-07-30 NOTE — Assessment & Plan Note (Signed)
She tolerated recent biopsy well She is not symptomatic with chest pain or shortness of breath or cough  Observe only

## 2017-07-30 NOTE — Assessment & Plan Note (Signed)
We have extensive discussion about goals of care She understood treatment goals is strictly palliative in nature

## 2017-08-01 ENCOUNTER — Other Ambulatory Visit: Payer: Self-pay | Admitting: Hematology and Oncology

## 2017-08-01 ENCOUNTER — Telehealth: Payer: Self-pay

## 2017-08-01 NOTE — Progress Notes (Signed)
START OFF PATHWAY REGIMEN - Uterine   [id number]:Pembrolizumab 200 mg q21 Days:   A cycle is 21 days:     Pembrolizumab   **Always confirm dose/schedule in your pharmacy ordering system**    Patient Characteristics: Endometrioid Histology, Newly Diagnosed, Resected, Stage IVA and IVB Histology: Endometrioid Histology Therapeutic Status: Newly Diagnosed AJCC T Category: T1 AJCC N Category: N0 AJCC M Category: M1 AJCC 8 Stage Grouping: IVB Surgical Status: Resected Intent of Therapy: Non-Curative / Palliative Intent, Discussed with Patient

## 2017-08-01 NOTE — Telephone Encounter (Signed)
Lenvatinib is not approved to be added We discussed port placement and for Pembrolizumab, she is unlikely going to need a port. Does she agree? I have to place orders for chemo, chemo class and see her back Does she has preference when she wants to start?

## 2017-08-01 NOTE — Telephone Encounter (Signed)
Called and given below message. Verbalized understanding.  She is glad to not get a port. She prefers morning time and can start as soon as appts are available.

## 2017-08-01 NOTE — Telephone Encounter (Signed)
She called and left a message to call her.  Called back. She has decided on Pembrolizumab as her treatment.  She has a family member who is is the medical profession. She is asking if  Lenvatinib can be added to the treatment plan?

## 2017-08-02 ENCOUNTER — Telehealth: Payer: Self-pay | Admitting: Genetics

## 2017-08-02 ENCOUNTER — Other Ambulatory Visit: Payer: Self-pay | Admitting: Hematology and Oncology

## 2017-08-02 ENCOUNTER — Telehealth: Payer: Self-pay | Admitting: Genetic Counselor

## 2017-08-02 DIAGNOSIS — C7800 Secondary malignant neoplasm of unspecified lung: Secondary | ICD-10-CM

## 2017-08-02 DIAGNOSIS — C541 Malignant neoplasm of endometrium: Secondary | ICD-10-CM

## 2017-08-02 NOTE — Telephone Encounter (Signed)
Scheduled appt per 5/07 los - left message with appt date and time.

## 2017-08-02 NOTE — Telephone Encounter (Signed)
Patient is now interested in having genetic testing.  She was previously seen in August 2019.   At that time we discussed germline genetic testing, and the patient declined testing at that time.  She was recently contacted by scheduling for a new genetic consult/lab.    She called to ask if she needed a new genetic consult.  I informed her that we already met in aug 2018, and therefore she does not need a new genetic consultation unless she wanted to speak to genetics again or had further questions.  She said she felt comfortable going forward with genetic testing not meeting again as she remembered meeting with [name] in genetics last year.   In this case, she just needs a lab draw.  A benefit investigation with the laboratory that does paired tumor testing/and germline testing estimated a high OOP cost, however, her previous insurance investigation for germline only testing with the lab [facility] was a lower cost predicted.   I informed her that the insurance estimate for germline genetic testing may be different now that her deductable may be different, etc.    The laboratory will send out an insurance investigation after receiving the sample and also offer a patient pay price of $250 and give the patient a time window to choose their preferred option.  I informed the patient to respond to any contact from the laboratory, [facility], as it may help lower her cost.  We scheduled her lab draw for 5/31 at 8:30 AM.

## 2017-08-02 NOTE — Telephone Encounter (Signed)
I have sent scheduling msg

## 2017-08-05 ENCOUNTER — Telehealth: Payer: Self-pay | Admitting: Oncology

## 2017-08-05 NOTE — Telephone Encounter (Signed)
Ferol Luz with genetics recommended adding on BRAF and MLH-1 testing to patient's original tumor.  Called Rhonda in pathology and discussed the cost of ordering BRAF and MLH-1 testing.  Suanne Marker will check with Claiborne Billings in billing to see what the cost would be for the patient before ordering.

## 2017-08-07 ENCOUNTER — Encounter: Payer: Self-pay | Admitting: *Deleted

## 2017-08-07 DIAGNOSIS — Z006 Encounter for examination for normal comparison and control in clinical research program: Secondary | ICD-10-CM

## 2017-08-08 ENCOUNTER — Inpatient Hospital Stay: Payer: 59

## 2017-08-08 ENCOUNTER — Inpatient Hospital Stay (HOSPITAL_BASED_OUTPATIENT_CLINIC_OR_DEPARTMENT_OTHER): Payer: 59 | Admitting: Hematology and Oncology

## 2017-08-08 ENCOUNTER — Encounter: Payer: Self-pay | Admitting: Hematology and Oncology

## 2017-08-08 VITALS — BP 118/50 | HR 89 | Temp 98.5°F | Resp 18 | Ht 60.0 in | Wt 162.3 lb

## 2017-08-08 DIAGNOSIS — C7800 Secondary malignant neoplasm of unspecified lung: Secondary | ICD-10-CM | POA: Diagnosis not present

## 2017-08-08 DIAGNOSIS — E038 Other specified hypothyroidism: Secondary | ICD-10-CM

## 2017-08-08 DIAGNOSIS — Z923 Personal history of irradiation: Secondary | ICD-10-CM | POA: Diagnosis not present

## 2017-08-08 DIAGNOSIS — C541 Malignant neoplasm of endometrium: Secondary | ICD-10-CM

## 2017-08-08 DIAGNOSIS — Z79899 Other long term (current) drug therapy: Secondary | ICD-10-CM

## 2017-08-08 DIAGNOSIS — Z006 Encounter for examination for normal comparison and control in clinical research program: Secondary | ICD-10-CM

## 2017-08-08 DIAGNOSIS — E039 Hypothyroidism, unspecified: Secondary | ICD-10-CM | POA: Insufficient documentation

## 2017-08-08 DIAGNOSIS — Z7189 Other specified counseling: Secondary | ICD-10-CM

## 2017-08-08 DIAGNOSIS — F329 Major depressive disorder, single episode, unspecified: Secondary | ICD-10-CM

## 2017-08-08 DIAGNOSIS — Z5112 Encounter for antineoplastic immunotherapy: Secondary | ICD-10-CM | POA: Diagnosis not present

## 2017-08-08 DIAGNOSIS — Z87891 Personal history of nicotine dependence: Secondary | ICD-10-CM | POA: Diagnosis not present

## 2017-08-08 DIAGNOSIS — R1031 Right lower quadrant pain: Secondary | ICD-10-CM | POA: Diagnosis not present

## 2017-08-08 DIAGNOSIS — K219 Gastro-esophageal reflux disease without esophagitis: Secondary | ICD-10-CM | POA: Diagnosis not present

## 2017-08-08 LAB — CBC WITH DIFFERENTIAL (CANCER CENTER ONLY)
Basophils Absolute: 0.1 10*3/uL (ref 0.0–0.1)
Basophils Relative: 2 %
EOS ABS: 0.2 10*3/uL (ref 0.0–0.5)
EOS PCT: 5 %
HCT: 37 % (ref 34.8–46.6)
Hemoglobin: 12.7 g/dL (ref 11.6–15.9)
LYMPHS ABS: 0.6 10*3/uL — AB (ref 0.9–3.3)
Lymphocytes Relative: 13 %
MCH: 31.2 pg (ref 25.1–34.0)
MCHC: 34.4 g/dL (ref 31.5–36.0)
MCV: 90.8 fL (ref 79.5–101.0)
Monocytes Absolute: 0.4 10*3/uL (ref 0.1–0.9)
Monocytes Relative: 9 %
Neutro Abs: 2.9 10*3/uL (ref 1.5–6.5)
Neutrophils Relative %: 71 %
Platelet Count: 249 10*3/uL (ref 145–400)
RBC: 4.07 MIL/uL (ref 3.70–5.45)
RDW: 13.8 % (ref 11.2–14.5)
WBC: 4.1 10*3/uL (ref 3.9–10.3)

## 2017-08-08 LAB — CMP (CANCER CENTER ONLY)
ALK PHOS: 69 U/L (ref 40–150)
ALT: 13 U/L (ref 0–55)
AST: 12 U/L (ref 5–34)
Albumin: 3.8 g/dL (ref 3.5–5.0)
Anion gap: 8 (ref 3–11)
BUN: 14 mg/dL (ref 7–26)
CHLORIDE: 106 mmol/L (ref 98–109)
CO2: 26 mmol/L (ref 22–29)
CREATININE: 0.8 mg/dL (ref 0.60–1.10)
Calcium: 9.4 mg/dL (ref 8.4–10.4)
GFR, Est AFR Am: >60 mL/min (ref >60)
Glucose, Bld: 89 mg/dL (ref 70–140)
Potassium: 3.8 mmol/L (ref 3.5–5.1)
Sodium: 140 mmol/L (ref 136–145)
Total Bilirubin: 0.6 mg/dL (ref 0.2–1.2)
Total Protein: 6.8 g/dL (ref 6.4–8.3)

## 2017-08-08 LAB — RESEARCH LABS

## 2017-08-08 MED ORDER — ONDANSETRON HCL 8 MG PO TABS
8.0000 mg | ORAL_TABLET | Freq: Three times a day (TID) | ORAL | 1 refills | Status: DC | PRN
Start: 2017-08-08 — End: 2017-08-26

## 2017-08-08 MED ORDER — PROCHLORPERAZINE MALEATE 10 MG PO TABS: 10.0000 mg | ORAL_TABLET | Freq: Four times a day (QID) | ORAL | 1 refills | Status: DC | PRN

## 2017-08-08 NOTE — Assessment & Plan Note (Addendum)
We have extensive discussion about goals of care She understood treatment goals is strictly palliative in nature She gave [name] a copy of her advance directives and living will She appointed Hart Carwin and Seth Bake as her MPOA I went through the content of the advanced directive with the patient's It appears that the patient would like aggressive supportive care but would not like to be dependent on life support if death is inevitable

## 2017-08-08 NOTE — Assessment & Plan Note (Signed)
We have reviewed plan of care The patient has abnormal MSI testing on tissue sample She is interested to pursue immunotherapy  She is okay to proceed with a port placement I have discussed this with pharmacist and have applied for payment assistance through her company in case her insurance company will not approve the treatment The plan will be minimum 3 cycles of treatment before repeat imaging study to assess response to therapy The risks, benefits, side effects of immunotherapy including risk of immune reaction, infection, hypersensitive lung reaction, poorly controlled thyroid function and others were discussed with the patient and she agreed to proceed Per guidelines, I will check TSH after each treatment and will discuss dose adjustment with her endocrinologist if TSH is abnormal

## 2017-08-08 NOTE — Assessment & Plan Note (Signed)
She tolerated recent biopsy well She is not symptomatic with chest pain or shortness of breath or cough  Observe only

## 2017-08-08 NOTE — Progress Notes (Signed)
[facility] PROGRESS NOTE  Patient Care Team: [name], MD as PCP - General (Internal Medicine) Jacelyn Pi, MD as Consulting Physician (Endocrinology)  ASSESSMENT & PLAN:  Endometrial cancer [facility]) We have reviewed plan of care The patient has abnormal MSI testing on tissue sample She is interested to pursue immunotherapy  She is okay to proceed with a port placement I have discussed this with pharmacist and have applied for payment assistance through her company in case her insurance company will not approve the treatment The plan will be minimum 3 cycles of treatment before repeat imaging study to assess response to therapy The risks, benefits, side effects of immunotherapy including risk of immune reaction, infection, hypersensitive lung reaction, poorly controlled thyroid function and others were discussed with the patient and she agreed to proceed Per guidelines, I will check TSH after each treatment and will discuss dose adjustment with her endocrinologist if TSH is abnormal    Metastasis to lung [facility]) She tolerated recent biopsy well She is not symptomatic with chest pain or shortness of breath or cough  Observe only  Goals of care, counseling/discussion We have extensive discussion about goals of care She understood treatment goals is strictly palliative in nature She gave [facility] a copy of her advance directives and living will She appointed Hart Carwin and Seth Bake as her MPOA I went through the content of the advanced directive with the patient's It appears that the patient would like aggressive supportive care but would not like to be dependent on life support if death is inevitable    Orders Placed This Encounter  Procedures  . TSH    Standing Status:   Standing    Number of Occurrences:   9    Standing Expiration Date:   08/09/2018    INTERVAL HISTORY: Please see below for problem oriented charting. She returns for chemotherapy  consent She feels well Denies cough, chest pain or shortness of breath Denies abdominal bloating, nausea or abnormal vaginal bleeding  SUMMARY OF ONCOLOGIC HISTORY: Oncology History   Abnormal MSI  Endometrioid     Endometrial cancer ([place])   09/15/2014 Pathology Results    Endometrium, curettage - ENDOMETRIAL ADENOCARCINOMA. - SEE COMMENT. Microscopic Comment Although definitive characterization is best performed on resection specimen, as sampled, the endometrial adenocarcinoma appears to be endometrioid subtype, FIGO grade 1.       09/15/2014 Surgery    PreOp: postmenopausal bleeding, cervical stenosis PostOp: same and uterine polyp Procedure:  Hysteroscopy, Dilation and Curettage, Myosure polypectomy Surgeon: Dr. Janyth Pupa  Findings:8cm uterus with thickened polypoid-like endometrium, large 2cm irregular appearing polyp  Specimens: 1) endometrial curettings      10/26/2014 Pathology Results    1. Uterus +/- tubes/ovaries, neoplastic - INVASIVE ADENOCARCINOMA, ENDOMETRIOID TYPE, SEE COMMENT. - TUMOR INVOLVES LESS THAN ONE HALF MYOMETRIAL THICKNESS. - TUMOR INVOLVES UTERINE ADENOMYOSIS. - BENIGN LEIOMYOMATA (UP TO 2.5 CM). - BENIGN CERVICAL MUCOSA; NEGATIVE FOR INTRAEPITHELIAL LESION OR MALIGNANCY. - BENIGN RIGHT AND LEFT OVARIES; NEGATIVE FOR ATYPIA OR MALIGNANCY. - BENIGN RIGHT AND LEFT FALLOPIAN TUBE; NEGATIVE FOR ATYPIA OR MALIGNANCY. - BENIGN PARATUBAL CYSTS; NEGATIVE FOR ATYPIA OR MALIGNANCY. - SEE TUMOR SYNOPTIC TEMPLATE BELOW. 2. Lymph nodes, regional resection, right pelvic - FOUR LYMPH NODES, NEGATIVE FOR TUMOR (0/4). 3. Lymph nodes, regional resection, left pelvic - FOUR LYMPH NODES, NEGATIVE FOR TUMOR (0/4). Microscopic Comment 1. ONCOLOGY TABLE-UTERUS, CARCINOMA OR CARCINOSARCOMA Specimen: Uterus and bilateral fallopian tubes and ovaries. Procedure: Hysterectomy and bilateral salpingo-oophorectomy. Lymph node sampling performed:  Yes. Specimen  integrity: Intact. Maximum tumor size: 4.0 cm (tumor involved entire endometrium) Histologic type: Adenocarcinoma, endometrioid type. Grade: 1 Myometrial invasion: 0.5 cm where myometrium is 1.5 cm in thickness Cervical stromal involvement: Absent. Extent of involvement of other organs: None Lymph - vascular invasion: Absent. Peritoneal washings: N/A Lymph nodes: # examined - 8 ; # positive - 0 Pelvic lymph nodes: N/A involved of N/A lymph nodes. Para-aortic lymph nodes: N/A involved of N/A lymph nodes. Other (specify involvement and site): N/A TNM code: pT1a, pN0 FIGO Stage (based on pathologic findings, needs clinical correlation): N/A Comments: None MSI testing abnormal      10/26/2014 Surgery    Surgery: Total robotic hysterectomy, bilateral salpingo-oophorectomy, bilateral pelvic lymph node dissection.   Surgeons:  Lucita Lora. Alycia Rossetti, MD; Lahoma Crocker, MD   Pathology: Uterus, cervix, bilateral tubes and ovaries, bilateral pelvic lymph nodes to pathology.   Operative findings: Omentum adherent to midline vertical incision. Fibroid uterus. Normal adnexa. Adhesions of anterior bladder to uterus. Frozen section with grade 1 cancer focally involving adenomyosis, minimal myometrial invasion.      11/17/2014 Imaging    Ct abdomen 1. Status post hysterectomy. Pelvic edema which is greater than typically seen 3 weeks postop. Suspicious for postoperative infection. 2. Bladder wall thickening and irregularity. Although this could be partially due to underdistention, Concurrent cystitis cannot be excluded. 3. Bilateral fluid density lesions along the pelvic sidewalls. Favored to represent seromas or lymphangiomas. No specific features to suggest abscess. If the patient's symptoms persist after appropriate antibiotic therapy, aspiration of the largest left pelvic sidewall "Lesion" should be considered. 4. Suspicion of mild hepatic steatosis. Indeterminate right hepatic lobe lesion. If  definitive characterization is desired in this patient with history of primary malignancy, nonemergent pre and post contrast abdominal MRI could be performed. 5. Hiatal hernia.      11/25/2014 Imaging    1. Persistent bilateral pelvic sidewall fluid collections, left greater than right, with left-sided inflammatory changes. 2. Technically successful 12 French left pelvic abscess drain catheter placement. A sample of the aspirate was sent for Gram stain, culture and sensitivity.      12/08/2014 Imaging    CT abdomen and pelvis 1. Complete resolution of dominant fluid collection within the left hemipelvis following percutaneous drainage catheter placement. This percutaneous drainage catheter was subsequent removed intact at the patient's bedside. 2. Continued decrease in size of bilobed fluid collections within the right hemipelvis with superficial component measuring 1.6 cm and posterior component measuring 2.6 cm, indeterminate though both favored to represent evolving seromas. 3. Unchanged indeterminate approximately 1.2 cm hepatic lesion. Further evaluation with contrast-enhanced abdominal MRI could be performed as clinically indicated. 4. Colonic diverticulosis without evidence of diverticulitis.      10/19/2016 Imaging    New 2.6 cm soft tissue mass involving right vaginal cuff, consistent with locally recurrent carcinoma.  No other sites of metastatic disease identified.  Colonic diverticulosis, without radiographic evidence of diverticulitis.  Mild hepatic steatosis.       10/24/2016 Relapse/Recurrence    Vaginal cuff recurrence. No evidence of distant disease.      10/24/2016 Pathology Results    Vagina, biopsy, right apex - ADENOCARCINOMA - SEE COMMENT Microscopic Comment The morphologic features are consistent with the patient's previously diagnosed endometrioid adenocarcinoma      11/08/2016 - 12/27/2016 Radiation Therapy    45 Gy in 25 fractions of 1.8 Gy. The residual  pelvic mass was boosted to 14 Gy in 7 fractions of 2 Gy. No  intracavitary lesion seen at completion of pelvic fields to boost with brachytherapy.      06/12/2017 Imaging    CT abdomen and pelvis 1. New 1.1 by 0.8 cm right lower lobe pulmonary nodule, concerning for pulmonary metastatic disease. Possibilities for further assessment include biopsy or nuclear medicine PET-CT to assess this lesion and the rest of the neck/chest/abdomen/pelvis for other potential hypermetabolic lesions. 2. The previous soft tissue mass along the right vaginal cuff is no longer present. 3. Stable and likely benign right hepatic lobe hypodense lesion. 4. Other imaging findings of potential clinical significance: Sigmoid colon diverticulosis. Aortic Atherosclerosis (ICD10-I70.0). Multilevel lumbar degenerative disc disease. Mild cardiomegaly. Small type 1 hiatal hernia.      07/05/2017 Imaging    Ct chest Several bilateral pulmonary nodules measuring up to 11 mm, highly suspicious for pulmonary metastases.   No evidence of lymphadenopathy or pleural effusion.  Aortic Atherosclerosis (ICD10-I70.0).      07/25/2017 Imaging    Status post CT-guided lung nodule biopsy. Tissue specimen sent to pathology for complete histopathologic analysis.      07/25/2017 Pathology Results    Lung, needle/core biopsy(ies), RLL - METASTATIC ADENOCARCINOMA - SEE COMMENT Microscopic Comment By immunohistochemistry, the neoplastic cells are positive for Pax-8 and ER but negative for TTF-1. Overall, this immunoprofile is consistent with metastasis from the patient's known endometrioid adenocarcinoma      07/30/2017 Cancer Staging    Staging form: Corpus Uteri - Carcinoma and Carcinosarcoma, AJCC 8th Edition - Clinical: Stage IVB (rcT1, cN0, pM1) - Signed by Heath Lark, MD on 07/30/2017      08/02/2017 -  Chemotherapy    The patient had pembrolizumab (KEYTRUDA) 200 mg in sodium chloride 0.9 % 50 mL chemo infusion, 200 mg, Intravenous,  Once, 0 of 4 cycles  for chemotherapy treatment.        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        Metastasis to lung ([place])   07/30/2017 Initial Diagnosis    Metastasis to lung ([place])      08/02/2017 -  Chemotherapy    The patient had pembrolizumab (KEYTRUDA) 200 mg in sodium chloride 0.9 % 50 mL chemo infusion, 200 mg, Intravenous, Once, 0 of 4 cycles  for chemotherapy treatment.        REVIEW OF SYSTEMS:   Constitutional: Denies fevers, chills or abnormal weight loss Eyes: Denies blurriness of vision Ears, nose, mouth, throat, and face: Denies mucositis or sore throat Respiratory: Denies cough, dyspnea or wheezes Cardiovascular: Denies palpitation, chest discomfort or lower extremity swelling Gastrointestinal:  Denies nausea, heartburn or change in bowel habits Skin: Denies abnormal skin rashes Lymphatics: Denies new lymphadenopathy or easy bruising Neurological:Denies numbness, tingling or new weaknesses Behavioral/Psych: Mood is stable, no new changes  All other systems were reviewed with the patient and are negative.  I have reviewed the past medical history, past surgical history, social history and family history with the patient and they are unchanged from previous note.  ALLERGIES:  is allergic to hydrocodone.  MEDICATIONS:  Current Outpatient Medications  Medication Sig Dispense Refill  . Calcium Carb-Cholecalciferol (CALCIUM 600 + D PO) Take 1 tablet by mouth daily.    Marland Kitchen FLUoxetine (PROZAC)  40 MG capsule Take 40 mg by mouth daily.    . furosemide (LASIX) 40 MG tablet Take 40 mg by mouth daily as needed for fluid.     Marland Kitchen levothyroxine (SYNTHROID, LEVOTHROID) 88 MCG tablet Take 88 mcg by mouth  daily before breakfast. Patient takes sat and sun    . naproxen sodium (ALEVE) 220 MG tablet Take 220 mg by mouth daily as needed (pain).    . ondansetron (ZOFRAN) 8 MG tablet Take 1 tablet (8 mg total) by mouth every 8 (eight) hours as needed (Nausea or vomiting). 30 tablet 1  . prochlorperazine (COMPAZINE) 10 MG tablet Take 1 tablet (10 mg total) by mouth every 6 (six) hours as needed (Nausea or vomiting). 30 tablet 1   No current facility-administered medications for this visit.     PHYSICAL EXAMINATION: ECOG PERFORMANCE STATUS: 0 - Asymptomatic  Vitals:   08/08/17 1256  BP: (!) 118/50  Pulse: 89  Resp: 18  Temp: 98.5 F (36.9 C)  SpO2: 98%   Filed Weights   08/08/17 1256  Weight: 162 lb 4.8 oz (73.6 kg)    GENERAL:alert, no distress and comfortable NEURO: alert & oriented x 3 with fluent speech, no focal motor/sensory deficits  LABORATORY DATA:  I have reviewed the data as listed    Component Value Date/Time   NA 140 08/08/2017 0922   K 3.8 08/08/2017 0922   CL 106 08/08/2017 0922   CO2 26 08/08/2017 0922   GLUCOSE 89 08/08/2017 0922   BUN 14 08/08/2017 0922   BUN 18.7 10/17/2016 1353   CREATININE 0.80 08/08/2017 0922   CREATININE 0.8 10/17/2016 1353   CALCIUM 9.4 08/08/2017 0922   PROT 6.8 08/08/2017 0922   ALBUMIN 3.8 08/08/2017 0922   AST 12 08/08/2017 0922   ALT 13 08/08/2017 0922   ALKPHOS 69 08/08/2017 0922   BILITOT 0.6 08/08/2017 0922   GFRNONAA >60 08/08/2017 0922   GFRAA >60 08/08/2017 0922    No results found for: SPEP, UPEP  Lab Results  Component Value Date   WBC 4.1 08/08/2017   NEUTROABS 2.9 08/08/2017   HGB 12.7 08/08/2017   HCT 37.0 08/08/2017   MCV 90.8 08/08/2017   PLT 249 08/08/2017      Chemistry      Component Value Date/Time   NA 140 08/08/2017 0922   K 3.8 08/08/2017 0922   CL 106 08/08/2017 0922   CO2 26 08/08/2017 0922   BUN 14 08/08/2017 0922   BUN 18.7 10/17/2016 1353   CREATININE 0.80 08/08/2017 0922    CREATININE 0.8 10/17/2016 1353      Component Value Date/Time   CALCIUM 9.4 08/08/2017 0922   ALKPHOS 69 08/08/2017 0922   AST 12 08/08/2017 0922   ALT 13 08/08/2017 0922   BILITOT 0.6 08/08/2017 0922       RADIOGRAPHIC STUDIES: I have personally reviewed the radiological images as listed and agreed with the findings in the report. Ct Biopsy  Result Date: 07/25/2017 INDICATION: 67 year old female with a history gynecologic malignancy and lung nodules EXAM: CT-GUIDED LUNG BIOPSY MEDICATIONS: None. ANESTHESIA/SEDATION: Moderate (conscious) sedation was employed during this procedure. A total of Versed 2.0 mg and Fentanyl 100 mcg was administered intravenously. Moderate Sedation Time: 14 minutes. The patient's level of consciousness and vital signs were monitored continuously by radiology nursing throughout the procedure under my direct supervision. FLUOROSCOPY TIME:  CT COMPLICATIONS: CT none PROCEDURE: The procedure, risks, benefits, and alternatives were explained to the patient and the  patient's family. Specific risks that were addressed included bleeding, infection, pneumothorax, need for further procedure including chest tube placement, chance of delayed pneumothorax or hemorrhage, hemoptysis, nondiagnostic sample, cardiopulmonary collapse, death. Questions regarding the procedure were encouraged and answered. The patient understands and consents to the procedure. Patient was positioned in the right decubitus position on the CT gantry table and a scout CT of the chest was performed for planning purposes. Once angle of approach was determined, the skin and subcutaneous tissues this scan was prepped and draped in the usual sterile fashion, and a sterile drape was applied covering the operative field. A sterile gown and sterile gloves were used for the procedure. Local anesthesia was provided with 1% Lidocaine. The skin and subcutaneous tissues were infiltrated 1% lidocaine for local anesthesia, and a  small stab incision was made with an 11 blade scalpel. Using CT guidance, a 17 gauge trocar needle was advanced into the right lower lobetarget. After confirmation of the tip, separate 18 gauge core biopsies were performed. The initial device failed after 3 passes and a second device was required for adequate samples. These were placed into solution for transportation to the lab. Biosentry Device was deployed. A final CT image was performed. Patient tolerated the procedure well and remained hemodynamically stable throughout. No complications were encountered and no significant blood loss was encounter IMPRESSION: Status post CT-guided lung nodule biopsy. Tissue specimen sent to pathology for complete histopathologic analysis. Signed, Dulcy Fanny. Earleen Newport, DO [facility] Electronically Signed   By: Corrie Mckusick D.O.   On: 07/25/2017 12:32   Dg Chest Port 1 View  Result Date: 07/25/2017 CLINICAL DATA:  Status post right lung biopsy. EXAM: PORTABLE CHEST 1 VIEW COMPARISON:  Radiographs of October 20, 2014. FINDINGS: The heart size and mediastinal contours are within normal limits. Both lungs are clear. No pneumothorax or pleural effusion is noted. The visualized skeletal structures are unremarkable. IMPRESSION: No pneumothorax is noted. No acute cardiopulmonary abnormality seen. Electronically Signed   By: Marijo Conception, M.D.   On: 07/25/2017 13:38    All questions were answered. The patient knows to call the clinic with any problems, questions or concerns. No barriers to learning was detected.  I spent 15 minutes counseling the patient face to face. The total time spent in the appointment was 20 minutes and more than 50% was on counseling and review of test results  Heath Lark, MD 08/08/2017 2:37 PM

## 2017-08-09 ENCOUNTER — Inpatient Hospital Stay: Payer: 59

## 2017-08-09 VITALS — BP 128/70 | HR 68 | Temp 98.9°F | Resp 16

## 2017-08-09 DIAGNOSIS — Z5112 Encounter for antineoplastic immunotherapy: Secondary | ICD-10-CM | POA: Diagnosis not present

## 2017-08-09 DIAGNOSIS — C7800 Secondary malignant neoplasm of unspecified lung: Secondary | ICD-10-CM

## 2017-08-09 DIAGNOSIS — C541 Malignant neoplasm of endometrium: Secondary | ICD-10-CM

## 2017-08-09 MED ORDER — SODIUM CHLORIDE 0.9 % IV SOLN
Freq: Once | INTRAVENOUS | Status: AC
  Administered 2017-08-09: 08:00:00 via INTRAVENOUS

## 2017-08-09 MED ORDER — SODIUM CHLORIDE 0.9 % IV SOLN
200.0000 mg | Freq: Once | INTRAVENOUS | Status: AC
  Administered 2017-08-09: 200 mg via INTRAVENOUS
  Filled 2017-08-09: qty 8

## 2017-08-09 NOTE — Patient Instructions (Signed)
[facility] Discharge Instructions for Patients Receiving Chemotherapy  Today you received the following chemotherapy agents Keytruda  To help prevent nausea and vomiting after your treatment, we encourage you to take your nausea medication as directed  If you develop nausea and vomiting that is not controlled by your nausea medication, call the clinic.   BELOW ARE SYMPTOMS THAT SHOULD BE REPORTED IMMEDIATELY:  *FEVER GREATER THAN 100.5 F  *CHILLS WITH OR WITHOUT FEVER  NAUSEA AND VOMITING THAT IS NOT CONTROLLED WITH YOUR NAUSEA MEDICATION  *UNUSUAL SHORTNESS OF BREATH  *UNUSUAL BRUISING OR BLEEDING  TENDERNESS IN MOUTH AND THROAT WITH OR WITHOUT PRESENCE OF ULCERS  *URINARY PROBLEMS  *BOWEL PROBLEMS  UNUSUAL RASH Items with * indicate a potential emergency and should be followed up as soon as possible.  Feel free to call the clinic should you have any questions or concerns. The clinic phone number is [phone].  Please show the CHEMO ALERT CARD at check-in to the Emergency Department and triage nurse.    Pembrolizumab injection What is this medicine? PEMBROLIZUMAB (pem broe liz ue mab) is a monoclonal antibody. It is used to treat melanoma, head and neck cancer, Hodgkin lymphoma, non-small cell lung cancer, urothelial cancer, stomach cancer, and cancers that have a certain genetic condition. This medicine may be used for other purposes; ask your health care provider or pharmacist if you have questions. COMMON BRAND NAME(S): Keytruda What should I tell my health care provider before I take this medicine? They need to know if you have any of these conditions: -diabetes -immune system problems -inflammatory bowel disease -liver disease -lung or breathing disease -lupus -organ transplant -an unusual or allergic reaction to pembrolizumab, other medicines, foods, dyes, or preservatives -pregnant or trying to get pregnant -breast-feeding How should I  use this medicine? This medicine is for infusion into a vein. It is given by a health care professional in a hospital or clinic setting. A special MedGuide will be given to you before each treatment. Be sure to read this information carefully each time. Talk to your pediatrician regarding the use of this medicine in children. While this drug may be prescribed for selected conditions, precautions do apply. Overdosage: If you think you have taken too much of this medicine contact a poison control center or emergency room at once. NOTE: This medicine is only for you. Do not share this medicine with others. What if I miss a dose? It is important not to miss your dose. Call your doctor or health care professional if you are unable to keep an appointment. What may interact with this medicine? Interactions have not been studied. Give your health care provider a list of all the medicines, herbs, non-prescription drugs, or dietary supplements you use. Also tell them if you smoke, drink alcohol, or use illegal drugs. Some items may interact with your medicine. This list may not describe all possible interactions. Give your health care provider a list of all the medicines, herbs, non-prescription drugs, or dietary supplements you use. Also tell them if you smoke, drink alcohol, or use illegal drugs. Some items may interact with your medicine. What should I watch for while using this medicine? Your condition will be monitored carefully while you are receiving this medicine. You may need blood work done while you are taking this medicine. Do not become pregnant while taking this medicine or for 4 months after stopping it. Women should inform their doctor if they wish to become pregnant or think they   might be pregnant. There is a potential for serious side effects to an unborn child. Talk to your health care professional or pharmacist for more information. Do not breast-feed an infant while taking this medicine or  for 4 months after the last dose. What side effects may I notice from receiving this medicine? Side effects that you should report to your doctor or health care professional as soon as possible: -allergic reactions like skin rash, itching or hives, swelling of the face, lips, or tongue -bloody or black, tarry -breathing problems -changes in vision -chest pain -chills -constipation -cough -dizziness or feeling faint or lightheaded -fast or irregular heartbeat -fever -flushing -hair loss -low blood counts - this medicine may decrease the number of white blood cells, red blood cells and platelets. You may be at increased risk for infections and bleeding. -muscle pain -muscle weakness -persistent headache -signs and symptoms of high blood sugar such as dizziness; dry mouth; dry skin; fruity breath; nausea; stomach pain; increased hunger or thirst; increased urination -signs and symptoms of kidney injury like trouble passing urine or change in the amount of urine -signs and symptoms of liver injury like dark urine, light-colored stools, loss of appetite, nausea, right upper belly pain, yellowing of the eyes or skin -stomach pain -sweating -weight loss Side effects that usually do not require medical attention (report to your doctor or health care professional if they continue or are bothersome): -decreased appetite -diarrhea -tiredness This list may not describe all possible side effects. Call your doctor for medical advice about side effects. You may report side effects to FDA at 1-800-FDA-1088. Where should I keep my medicine? This drug is given in a hospital or clinic and will not be stored at home. NOTE: This sheet is a summary. It may not cover all possible information. If you have questions about this medicine, talk to your doctor, pharmacist, or health care provider.  2018 Elsevier/Gold Standard (2015-12-20 12:29:36)    

## 2017-08-15 ENCOUNTER — Telehealth: Payer: Self-pay

## 2017-08-15 NOTE — Telephone Encounter (Signed)
Pt called to report muscle aches since 5/18 "from my head to my fingers and toes".  Pt received Keytruda on 5/17.  Pt states at worst pain = 9 and at best pain = 1.  Pt instructed that she can alternate Alleve with Tylenol and this aching is a natural response to treatment.  Pt reports no other symptoms at this time and was instructed to call back with any other symptoms.

## 2017-08-16 ENCOUNTER — Telehealth: Payer: Self-pay

## 2017-08-16 NOTE — Telephone Encounter (Signed)
Pt dropped off FMLA papers.   Papers put in box to be picked up by HIM

## 2017-08-20 ENCOUNTER — Telehealth: Payer: Self-pay | Admitting: *Deleted

## 2017-08-20 NOTE — Telephone Encounter (Signed)
Called to follow up patient's muscle pain. Was taking aleve. Dr Alvy Bimler wanted to know how she was doing. Requested return call.

## 2017-08-21 NOTE — Telephone Encounter (Signed)
Pt left a message stating she is doing fine with taking aleve and tylenol for her pain, does not want anything else

## 2017-08-22 NOTE — Progress Notes (Signed)
FMLA successfully faxed to [employer] at [phone]. Mailed copy to patient address on file. 

## 2017-08-23 ENCOUNTER — Telehealth: Payer: Self-pay | Admitting: Genetics

## 2017-08-23 ENCOUNTER — Other Ambulatory Visit: Payer: 59

## 2017-08-23 NOTE — Telephone Encounter (Signed)
Revealed negative genetic testing.    This normal result is reassuring and indicates that it is unlikely Ms. Deems's cancer is due to a hereditary cause.  It is unlikely that there is an increased risk of another cancer due to a mutation in one of these genes.  However, genetic testing is not perfect, and cannot definitively rule out a hereditary cause.  It will be important for her to keep in contact with genetics to learn if any additional testing may be needed in the future.   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  We discussed that there can be some sporadic causes for that abnormal test result, and that her doctor could do some further testing on her tumor to confirm that this seems to be sporadic rather than hereditary.

## 2017-08-26 ENCOUNTER — Encounter: Payer: Self-pay | Admitting: Oncology

## 2017-08-26 ENCOUNTER — Encounter: Payer: Self-pay | Admitting: Obstetrics

## 2017-08-26 ENCOUNTER — Telehealth: Payer: Self-pay | Admitting: Oncology

## 2017-08-26 ENCOUNTER — Inpatient Hospital Stay: Payer: 59 | Attending: Gynecologic Oncology | Admitting: Obstetrics

## 2017-08-26 VITALS — BP 123/73 | HR 65 | Temp 98.8°F | Resp 20 | Ht 60.0 in | Wt 164.6 lb

## 2017-08-26 DIAGNOSIS — E039 Hypothyroidism, unspecified: Secondary | ICD-10-CM | POA: Insufficient documentation

## 2017-08-26 DIAGNOSIS — Z9221 Personal history of antineoplastic chemotherapy: Secondary | ICD-10-CM

## 2017-08-26 DIAGNOSIS — C7801 Secondary malignant neoplasm of right lung: Secondary | ICD-10-CM | POA: Insufficient documentation

## 2017-08-26 DIAGNOSIS — C541 Malignant neoplasm of endometrium: Secondary | ICD-10-CM | POA: Insufficient documentation

## 2017-08-26 DIAGNOSIS — Z5112 Encounter for antineoplastic immunotherapy: Secondary | ICD-10-CM | POA: Diagnosis present

## 2017-08-26 DIAGNOSIS — Z923 Personal history of irradiation: Secondary | ICD-10-CM

## 2017-08-26 DIAGNOSIS — C7800 Secondary malignant neoplasm of unspecified lung: Secondary | ICD-10-CM

## 2017-08-26 DIAGNOSIS — Z90710 Acquired absence of both cervix and uterus: Secondary | ICD-10-CM | POA: Diagnosis not present

## 2017-08-26 DIAGNOSIS — Z90722 Acquired absence of ovaries, bilateral: Secondary | ICD-10-CM

## 2017-08-26 DIAGNOSIS — Z79899 Other long term (current) drug therapy: Secondary | ICD-10-CM | POA: Diagnosis not present

## 2017-08-26 NOTE — Progress Notes (Addendum)
GYN oncology follow-up visit. CC: left pelvic pain   HPI 67 year old woman with stage IA grade 1 endometrial cancer status post definitive surgery on 10/26/2014. I had not seen her in approximately 15 months and she come in in August 2018 with c/o pain. Imaging and biopsy were c/w recurrent disease.  She completed external beam radiation therapy under the care of Dr. Sondra Come in October.   She was found to have lung mets reports biopsy done Spring 2019 and has had one cycle immunotherapy under Dr. Alvy Bimler. No chemotherapy since initial diagnosis.  She presents today for Gyn Onc followup and pelvic. Denies bleeding, denies pelvic pain.  History of presenting illness:  Ashley Villegas is a 67 y.o. year old No obstetric history on file. initially seen in consultation on 09/30/14, referred by Dr Nelda Marseille for grade 1 endometrial cancer.  She then underwent a robotic hysterectomy, BSO and bilateral pelvic node dissection on 05/03/34 without complications.  Her postoperative course was uncomplicated .  Her final pathologic diagnosis is a Stage IA Grade 1 endometrioid endometrial cancer with no  lymphovascular space invasion, 5/15 mm (33%) of myometrial invasion and negative lymph nodes. Postoperatively she developed bilateral symptomatic lymphocyst. There were drained and she was treated with antibiotics.  IMPRESSION: 1. Status post hysterectomy. Pelvic edema which is greater than typically seen 3 weeks postop. Suspicious for postoperative infection. 2. Bladder wall thickening and irregularity. Although this could be partially due to underdistention, Concurrent cystitis cannot be excluded. 3. Bilateral fluid density lesions along the pelvic sidewalls. Favored to represent seromas or lymphangiomas. No specific features to suggest abscess. If the patient's symptoms persist after appropriate antibiotic therapy, aspiration of the largest left pelvic sidewall "Lesion" should be considered. 4. Suspicion of mild  hepatic steatosis. Indeterminate right hepatic lobe lesion. If definitive characterization is desired in this patient with history of primary malignancy, nonemergent pre and post contrast abdominal MRI could be performed. 5. Hiatal hernia.  IMPRESSION (9/16): 1. Complete resolution of dominant fluid collection within the left hemipelvis following percutaneous drainage catheter placement. This percutaneous drainage catheter was subsequent removed intact at the patient's bedside. 2. Continued decrease in size of bilobed fluid collections within the right hemipelvis with superficial component measuring 1.6 cm and posterior component measuring 2.6 cm, indeterminate though both favored to represent evolving seromas. 3. Unchanged indeterminate approximately 1.2 cm hepatic lesion. 4. Colonic diverticulosis without evidence of diverticulitis.  I last saw her in March 2017. She did not keep her March 2018 appointment and called stating that she was having pelvic pain and she needed to come in. She did not see her physician Dr. Suanne Marker in between our 2 visits either until last week when she saw her on Wednesday complaining of pain. She states that her a few months ago she developed food poisoning after eating out and that's when this discomfort started. On the pain is primarily on the right is worse with sitting some days are better than others. It occasionally and rarely does wake her up at night but is mostly during the day.   I saw her in clinic at which time she was tender and I felt a fullness on RV exam towards her right side. A CT scan was obtained that revealed: IMPRESSION 10/19/16: New 2.6 cm soft tissue mass involving right vaginal cuff, consistent with locally recurrent carcinoma.No other sites of metastatic disease identified. Colonic diverticulosis, without radiographic evidence of diverticulitis. Mild hepatic steatosis.   10/24/16: Diagnosis Vagina, biopsy, right apex - ADENOCARCINOMA -  SEE  COMMENT Microscopic Comment  The morphologic features are consistent with the patient's previously diagnosed endometrioid adenocarcinoma.  11/08/2016-12/27/2016  1. The pelvis was treated to 45 Gy in 25 fractions of 1.8 Gy. 2. The residual pelvic mass was boosted to 14 Gy in 7 fractions of 2 Gy. (No intracavitary lesion seen at completion of pelvic fields to boost with brachytherapy).  At that time CT scan for her boost field revealed the tumor mass to be shrinking and they held off on repeating diagnostic CT scan at this time unless symptoms worsen.    Current Outpatient Medications on File Prior to Visit  Medication Sig Dispense Refill  . Calcium Carb-Cholecalciferol (CALCIUM 600 + D PO) Take 1 tablet by mouth daily.    Marland Kitchen FLUoxetine (PROZAC) 40 MG capsule Take 40 mg by mouth daily.    . furosemide (LASIX) 40 MG tablet Take 40 mg by mouth daily as needed for fluid.     Marland Kitchen levothyroxine (SYNTHROID, LEVOTHROID) 88 MCG tablet Take 88 mcg by mouth daily before breakfast. Patient takes sat and sun    . naproxen sodium (ALEVE) 220 MG tablet Take 220 mg by mouth daily as needed (pain).     No current facility-administered medications on file prior to visit.    Allergies  Allergen Reactions  . Hydrocodone Rash   Past Medical History:  Diagnosis Date  . Anxiety   . Arthritis   . Cancer [facility])    endometrial cancer  . Complication of anesthesia   . Depression   . Family history of uterine cancer   . GERD (gastroesophageal reflux disease)   . History of radiation therapy 11/08/2016-12/27/2016   pelvis 45 Gy in 25 fractions, pelvis boost 14 gy in 7 fractions  . Hypothyroidism    on synthroid   . PONV (postoperative nausea and vomiting)    Past Surgical History:  Procedure Laterality Date  . CESAREAN SECTION    . COLONOSCOPY    . DIAGNOSTIC LAPAROSCOPY     fibroid tumors on her ovaries   . DILATION AND CURETTAGE OF UTERUS    . HYSTEROSCOPY W/D&C N/A 09/15/2014   Procedure: DILATATION  AND CURETTAGE /HYSTEROSCOPY/MYOSURE RESECTION OF POLYP;  Surgeon: Janyth Pupa, DO;  Location: [facility];  Service: Gynecology;  Laterality: N/A;  . KNEE SURGERY    . LYMPH NODE DISSECTION N/A 10/26/2014   Procedure: POSS LYMPHADENECTOMY ;  Surgeon: [name], MD;  Location: [facility];  Service: Gynecology;  Laterality: N/A;  . ROBOTIC ASSISTED TOTAL HYSTERECTOMY WITH BILATERAL SALPINGO OOPHERECTOMY Bilateral 10/26/2014   Procedure: ROBOTIC ASSISTED TOTAL HYSTERECTOMY WITH BILATERAL SALPINGO OOPHORECTOMY;  Surgeon: [name], MD;  Location: [facility];  Service: Gynecology;  Laterality: Bilateral;  . TUBAL LIGATION    . WISDOM TOOTH EXTRACTION     Family History  Problem Relation Age of Onset  . Breast cancer Maternal Aunt        dx late 50's/60's, died in 77's  . Uterine cancer Mother 27  . Heart failure Mother   . Dementia Father   . Heart attack Paternal Grandfather 44  . Other Sister 14       had abnromal finding on mammogram, unsure if cancer. Was told to 'remove part of of body'   Social History   Socioeconomic History  . Marital status: Single    Spouse name: Not on file  . Number of children: 2  . Years of education: Not on file  . Highest education level: Not on file  Occupational History  . Occupation: Therapist, art  Social Needs  . Financial resource strain: Not on file  . Food insecurity:    Worry: Not on file    Inability: Not on file  . Transportation needs:    Medical: Not on file    Non-medical: Not on file  Tobacco Use  . Smoking status: Former Smoker    Packs/day: 0.50    Years: 13.00    Pack years: 6.50    Types: Cigarettes    Last attempt to quit: 10/20/2007    Years since quitting: 9.8  . Smokeless tobacco: Never Used  Substance and Sexual Activity  . Alcohol use: No  . Drug use: No  . Sexual activity: Not on file  Lifestyle  . Physical activity:    Days per week: Not on file    Minutes per session: Not on file  . Stress: Not on file   Relationships  . Social connections:    Talks on phone: Not on file    Gets together: Not on file    Attends religious service: Not on file    Active member of club or organization: Not on file    Attends meetings of clubs or organizations: Not on file    Relationship status: Not on file  . Intimate partner violence:    Fear of current or ex partner: Not on file    Emotionally abused: Not on file    Physically abused: Not on file    Forced sexual activity: Not on file  Other Topics Concern  . Not on file  Social History Narrative  . Not on file    Review of Systems  Constitutional: Positive for fatigue.  Endocrine: Positive for hot flashes.  Musculoskeletal: Positive for myalgias.  Psychiatric/Behavioral: Positive for depression. The patient is nervous/anxious.   All other systems reviewed and are negative. + joint stiffness and swollen joints  Physical Exam: Blood pressure 123/73, pulse 65, temperature 98.8 F (37.1 C), temperature source Oral, resp. rate 20, height 5' (1.524 m), weight 164 lb 9.6 oz (74.7 kg), SpO2 99 %.   ECOG PERFORMANCE STATUS: 0 - Asymptomatic   General :  Well developed, 67 y.o., female in no apparent distress HEENT:  Normocephalic/atraumatic, symmetric, EOMI, eyelids normal Neck:   Supple, no masses.  Lymphatics:  No cervical/ submandibular/ supraclavicular/ infraclavicular/ inguinal adenopathy Respiratory:  Respirations unlabored, no use of accessory muscles CV:   Deferred Breast:  Deferred Musculoskeletal: No CVA tenderness, normal muscle strength. Abdomen:  Soft, non-tender and nondistended. No evidence of hernia. No masses. Extremities:  No lymphedema, no erythema, non-tender. Skin:   Normal inspection Neuro/Psych:  No focal motor deficit, no abnormal mental status. Normal gait. Normal affect. Alert and oriented to person, place, and time  Genito Urinary: Vulva: Normal external female genitalia.  Bladder/urethra: Urethral meatus normal in  size and location. No lesions or   masses, well supported bladder Speculum exam: Vagina: Notable for narrow caliber secondary to radiation.  Some challenge ectasias consistent with radiation.  No lesion, no discharge, no bleeding. Cervix: Surgically absent Bimanual exam:  Uterus: Surgically absent  Adnexa: No masses. Rectovaginal:  Notable for some narrowing at the most distal aspect of my single digit exam.  Good tone, no masses, no cul de sac nodularity, no parametrial involvement or nodularity.  ASSESSMENT Recurrent uterine cancer  PLAN 1. Recurrent uterine cancer with lung mets  Continued systemic therapy per Dr. Alvy Bimler  Imaging followup per Dr. Alvy Bimler to follow MOD  2. Followup  Pelvic every 3 months; alternating with Dr. Sondra Come 3. Genetics  Determine if testing / counseling ; will check with office  Isabel Caprice, MD   NOTE genetics results reviewed noting negative [employer] 47 gene panel 08/08/17

## 2017-08-26 NOTE — Telephone Encounter (Signed)
Notified Ashley Villegas of appointment with Dr. Sondra Come on 12/02/17 at 10 am.  She verbalized understanding and agreement.

## 2017-08-26 NOTE — Patient Instructions (Addendum)
1. Plan to follow up with Dr. Gerarda Fraction three months after seeing Dr. Sondra Come.   2. Report any vaginal bleeding to [name].

## 2017-08-28 ENCOUNTER — Ambulatory Visit: Payer: 59 | Admitting: Gynecologic Oncology

## 2017-08-29 ENCOUNTER — Encounter: Payer: Self-pay | Admitting: Pharmacy Technician

## 2017-08-29 ENCOUNTER — Encounter: Payer: Self-pay | Admitting: Genetics

## 2017-08-29 ENCOUNTER — Ambulatory Visit: Payer: Self-pay | Admitting: Genetics

## 2017-08-29 DIAGNOSIS — Z8049 Family history of malignant neoplasm of other genital organs: Secondary | ICD-10-CM

## 2017-08-29 DIAGNOSIS — Z1379 Encounter for other screening for genetic and chromosomal anomalies: Secondary | ICD-10-CM

## 2017-08-29 DIAGNOSIS — C541 Malignant neoplasm of endometrium: Secondary | ICD-10-CM

## 2017-08-29 NOTE — Progress Notes (Signed)
The patient is approved for drug assistance by [employer] for [employer]. Enrollment is from 08/28/17-08/29/18 and is based on excessive OOP.  Drug replacement starts on DOS 08/09/17.

## 2017-08-29 NOTE — Progress Notes (Signed)
HPI:  Ms. Rush Landmark was previously seen in the [facility] on 11/12/2017 due to a personal and family history of uterine cancer.  At that time she declined testing.  Recently, after her tumor was found to be MSI high, she elected to proceed with germline- only genetic testing on 08/23/2017.  (due to high OOP cost of doing [facility]).    Please refer to our prior cancer genetics clinic note for more information regarding Ms. Hurrell's medical, social and family histories, and our assessment and recommendations, at the time. Ms. Benjaman Kindler recent genetic test results were disclosed to her, as well as recommendations warranted by these results. These results and recommendations are discussed in more detail below.  CANCER HISTORY:  Oncology History   Abnormal MSI  Endometrioid     Endometrial cancer ([place])   09/15/2014 Pathology Results    Endometrium, curettage - ENDOMETRIAL ADENOCARCINOMA. - SEE COMMENT. Microscopic Comment Although definitive characterization is best performed on resection specimen, as sampled, the endometrial adenocarcinoma appears to be endometrioid subtype, FIGO grade 1.       09/15/2014 Surgery    PreOp: postmenopausal bleeding, cervical stenosis PostOp: same and uterine polyp Procedure:  Hysteroscopy, Dilation and Curettage, Myosure polypectomy Surgeon: Dr. Janyth Pupa  Findings:8cm uterus with thickened polypoid-like endometrium, large 2cm irregular appearing polyp  Specimens: 1) endometrial curettings      10/26/2014 Pathology Results    1. Uterus +/- tubes/ovaries, neoplastic - INVASIVE ADENOCARCINOMA, ENDOMETRIOID TYPE, SEE COMMENT. - TUMOR INVOLVES LESS THAN ONE HALF MYOMETRIAL THICKNESS. - TUMOR INVOLVES UTERINE ADENOMYOSIS. - BENIGN LEIOMYOMATA (UP TO 2.5 CM). - BENIGN CERVICAL MUCOSA; NEGATIVE FOR INTRAEPITHELIAL LESION OR MALIGNANCY. - BENIGN RIGHT AND LEFT OVARIES; NEGATIVE FOR ATYPIA OR MALIGNANCY. - BENIGN  RIGHT AND LEFT FALLOPIAN TUBE; NEGATIVE FOR ATYPIA OR MALIGNANCY. - BENIGN PARATUBAL CYSTS; NEGATIVE FOR ATYPIA OR MALIGNANCY. - SEE TUMOR SYNOPTIC TEMPLATE BELOW. 2. Lymph nodes, regional resection, right pelvic - FOUR LYMPH NODES, NEGATIVE FOR TUMOR (0/4). 3. Lymph nodes, regional resection, left pelvic - FOUR LYMPH NODES, NEGATIVE FOR TUMOR (0/4). Microscopic Comment 1. ONCOLOGY TABLE-UTERUS, CARCINOMA OR CARCINOSARCOMA Specimen: Uterus and bilateral fallopian tubes and ovaries. Procedure: Hysterectomy and bilateral salpingo-oophorectomy. Lymph node sampling performed: Yes. Specimen integrity: Intact. Maximum tumor size: 4.0 cm (tumor involved entire endometrium) Histologic type: Adenocarcinoma, endometrioid type. Grade: 1 Myometrial invasion: 0.5 cm where myometrium is 1.5 cm in thickness Cervical stromal involvement: Absent. Extent of involvement of other organs: None Lymph - vascular invasion: Absent. Peritoneal washings: N/A Lymph nodes: # examined - 8 ; # positive - 0 Pelvic lymph nodes: N/A involved of N/A lymph nodes. Para-aortic lymph nodes: N/A involved of N/A lymph nodes. Other (specify involvement and site): N/A TNM code: pT1a, pN0 FIGO Stage (based on pathologic findings, needs clinical correlation): N/A Comments: None MSI testing abnormal      10/26/2014 Surgery    Surgery: Total robotic hysterectomy, bilateral salpingo-oophorectomy, bilateral pelvic lymph node dissection.   Surgeons:  Lucita Lora. Alycia Rossetti, MD; Lahoma Crocker, MD   Pathology: Uterus, cervix, bilateral tubes and ovaries, bilateral pelvic lymph nodes to pathology.   Operative findings: Omentum adherent to midline vertical incision. Fibroid uterus. Normal adnexa. Adhesions of anterior bladder to uterus. Frozen section with grade 1 cancer focally involving adenomyosis, minimal myometrial invasion.      11/17/2014 Imaging    Ct abdomen 1. Status post hysterectomy. Pelvic edema which is greater  than typically seen 3 weeks postop. Suspicious for postoperative infection. 2. Bladder wall thickening  and irregularity. Although this could be partially due to underdistention, Concurrent cystitis cannot be excluded. 3. Bilateral fluid density lesions along the pelvic sidewalls. Favored to represent seromas or lymphangiomas. No specific features to suggest abscess. If the patient's symptoms persist after appropriate antibiotic therapy, aspiration of the largest left pelvic sidewall "Lesion" should be considered. 4. Suspicion of mild hepatic steatosis. Indeterminate right hepatic lobe lesion. If definitive characterization is desired in this patient with history of primary malignancy, nonemergent pre and post contrast abdominal MRI could be performed. 5. Hiatal hernia.      11/25/2014 Imaging    1. Persistent bilateral pelvic sidewall fluid collections, left greater than right, with left-sided inflammatory changes. 2. Technically successful 12 French left pelvic abscess drain catheter placement. A sample of the aspirate was sent for Gram stain, culture and sensitivity.      12/08/2014 Imaging    CT abdomen and pelvis 1. Complete resolution of dominant fluid collection within the left hemipelvis following percutaneous drainage catheter placement. This percutaneous drainage catheter was subsequent removed intact at the patient's bedside. 2. Continued decrease in size of bilobed fluid collections within the right hemipelvis with superficial component measuring 1.6 cm and posterior component measuring 2.6 cm, indeterminate though both favored to represent evolving seromas. 3. Unchanged indeterminate approximately 1.2 cm hepatic lesion. Further evaluation with contrast-enhanced abdominal MRI could be performed as clinically indicated. 4. Colonic diverticulosis without evidence of diverticulitis.      10/19/2016 Imaging    New 2.6 cm soft tissue mass involving right vaginal cuff, consistent with locally  recurrent carcinoma.  No other sites of metastatic disease identified.  Colonic diverticulosis, without radiographic evidence of diverticulitis.  Mild hepatic steatosis.       10/24/2016 Relapse/Recurrence    Vaginal cuff recurrence. No evidence of distant disease.      10/24/2016 Pathology Results    Vagina, biopsy, right apex - ADENOCARCINOMA - SEE COMMENT Microscopic Comment The morphologic features are consistent with the patient's previously diagnosed endometrioid adenocarcinoma      11/08/2016 - 12/27/2016 Radiation Therapy    45 Gy in 25 fractions of 1.8 Gy. The residual pelvic mass was boosted to 14 Gy in 7 fractions of 2 Gy. No intracavitary lesion seen at completion of pelvic fields to boost with brachytherapy.      06/12/2017 Imaging    CT abdomen and pelvis 1. New 1.1 by 0.8 cm right lower lobe pulmonary nodule, concerning for pulmonary metastatic disease. Possibilities for further assessment include biopsy or nuclear medicine PET-CT to assess this lesion and the rest of the neck/chest/abdomen/pelvis for other potential hypermetabolic lesions. 2. The previous soft tissue mass along the right vaginal cuff is no longer present. 3. Stable and likely benign right hepatic lobe hypodense lesion. 4. Other imaging findings of potential clinical significance: Sigmoid colon diverticulosis. Aortic Atherosclerosis (ICD10-I70.0). Multilevel lumbar degenerative disc disease. Mild cardiomegaly. Small type 1 hiatal hernia.      07/05/2017 Imaging    Ct chest Several bilateral pulmonary nodules measuring up to 11 mm, highly suspicious for pulmonary metastases.   No evidence of lymphadenopathy or pleural effusion.  Aortic Atherosclerosis (ICD10-I70.0).      07/25/2017 Imaging    Status post CT-guided lung nodule biopsy. Tissue specimen sent to pathology for complete histopathologic analysis.      07/25/2017 Pathology Results    Lung, needle/core biopsy(ies), RLL - METASTATIC  ADENOCARCINOMA - SEE COMMENT Microscopic Comment By immunohistochemistry, the neoplastic cells are positive for Pax-8 and ER but negative for  TTF-1. Overall, this immunoprofile is consistent with metastasis from the patient's known endometrioid adenocarcinoma      07/30/2017 Cancer Staging    Staging form: Corpus Uteri - Carcinoma and Carcinosarcoma, AJCC 8th Edition - Clinical: Stage IVB (rcT1, cN0, pM1) - Signed by Heath Lark, MD on 07/30/2017      08/09/2017 -  Chemotherapy    The patient had pembrolizumab for chemotherapy treatment.        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        Metastasis to lung ([place])   07/30/2017 Initial Diagnosis    Metastasis to lung ([place])      08/02/2017 -  Chemotherapy    The patient had pembrolizumab (KEYTRUDA) 200 mg in sodium chloride 0.9 % 50 mL chemo infusion, 200 mg, Intravenous, Once, 1 of 4 cycles Administration: 200 mg (08/09/2017)  for chemotherapy treatment.         FAMILY HISTORY:  We obtained a detailed, 4-generation family history.  Significant diagnoses are listed below: Family History  Problem Relation Age of Onset  . Breast cancer Maternal Aunt        dx late 50's/60's, died in 42's  . Uterine cancer Mother 14  . Heart failure Mother   . Dementia Father   . Heart attack Paternal Grandfather 57  . Other Sister 27       had abnromal finding on mammogram, unsure if cancer. Was told to 'remove part of of body'   Ms. Rush Landmark has a 71 year-old son and a 22 year-old daughter.  Her daughter has a son (78) and daughter (19).  The 20 year-old granddaughter has a 42 year-old daughter.  None of the patient's children,  grandchildren, or great grandchildren have any history of cancer.   Ms. Rush Landmark has 3 sisters and 3 brothers listed below: -1 sister died at 89 (not due to health issues) and had a son who was placed for adoption (no info known).  -1 sister is 12 with no children or any history of cancer.  She has ovaries and uterus intact.  -1 sister is 55 and had a hysterectomy due to endometriosis.  She had an abnormal finding on a mammogram and was told to 'remove a part of her body'.  The patient is unsure is this was cancer or not.  It happened 4-5 years ago.  This sister did not have any children.  -1 brother is 87 with no history of cancer.  He has a 33 year-old son with no history of cancer.  -1 brother is 20 with no history of cancer.  He had 3 sons.  1 died due to trauma.  The other 2 nephews are 40 and 53 with no history of cancer.  -1 brother is 49 with no history of cancer.  He has an 48 year-old son and a 68 year-old daughter with no history of cancer.   Ms. Benjaman Kindler father died at 60 and had Dimentia.   -1 paternal aunt died in her 65's with no history of cancer.  She had children, but no information is known about them.  -1 paternal aunt died in her 64's with no history of cancer.  She had children, but no information is known about them.  Ms. Benjaman Kindler paternal grandfather died from a heart attack at 51.  No other cancer diagnosis are known on his side of the family.  Ms. Benjaman Kindler paternal grandmother died in her 64's from age related causes.  No known cancer diagnosis on her side of the family.    Ms. Benjaman Kindler mother had endometrial carcinoma in her 33's.  She died of heart failure at 62.   -1 maternal aunt died in her 53's.  She was diagnosed with breast cancer in her late 50's/early 23's.  She had no children.  -1 maternal aunt died in her 59's, but no information is known about this relative. No known history of cancer.  Ms. Benjaman Kindler maternal  grandfather died at 18.  No information is known about his side of the family.  Ms. Benjaman Kindler maternal grandmother died at 96 and had no history of cancer.  No information is known about her side of the family.   Ms. Rush Landmark is unaware of previous family history of genetic testing for hereditary cancer risks. Patient's maternal ancestors are of [place] descent, and paternal ancestors are of Caucasian descent. There is no reported Ashkenazi Jewish ancestry. There is no known consanguinity.  GENETIC TEST RESULTS: Genetic testing performed through [facility]'s Common Hereditary Cancers Panel reported out on 08/15/2017 showed no pathogenic mutations. The Common Hereditary Cancer Panel offered by [facility] includes sequencing and/or deletion duplication testing of the following 47 genes: APC, ATM, AXIN2, BARD1, BMPR1A, BRCA1, BRCA2, BRIP1, CDH1, CDKN2A (p14ARF), CDKN2A (p16INK4a), CKD4, CHEK2, CTNNA1, DICER1, EPCAM (Deletion/duplication testing only), GREM1 (promoter region deletion/duplication testing only), KIT, MEN1, MLH1, MSH2, MSH3, MSH6, MUTYH, NBN, NF1, NHTL1, PALB2, PDGFRA, PMS2, POLD1, POLE, PTEN, RAD50, RAD51C, RAD51D, SDHB, SDHC, SDHD, SMAD4, SMARCA4. STK11, TP53, TSC1, TSC2, and VHL.  The following genes were evaluated for sequence changes only: SDHA and HOXB13 c.251G>A variant only.  The test report will be scanned into EPIC and will be located under the Molecular Pathology section of the Results Review tab. A portion of the result report is included below for reference.     We discussed with Ms. Winecoff that because current genetic testing is not perfect, it is possible there may be a gene mutation in one of these genes that current testing cannot detect, but that chance is small.  We also discussed, that there could be another gene that has not yet been discovered, or that we have not yet tested, that is responsible for the cancer diagnoses in the family. It is also possible there is  a hereditary cause for the cancer in the family that Ms. Artz did not inherit and therefore was not identified in her testing.  Therefore, it is important to remain in touch with cancer genetics in the future so that we can continue to offer Ms. Cragun the most up to date genetic testing.   ADDITIONAL GENETIC TESTING: We discussed with Ms. Groot that there are other genes that are associated with increased cancer risk that can be analyzed. The laboratories that offer this testing look at these additional genes via a hereditary cancer gene panel. Should Ms. Cones wish to pursue additional genetic testing, we are happy to discuss and coordinate this testing, at any time.    CANCER SCREENING RECOMMENDATIONS: Ms. Benjaman Kindler test result is considered negative (normal).  This means that we have not identified a hereditary cause for her personal and family history of cancer at this time.   While reassuring,  this does not definitively rule out a hereditary predisposition to cancer. It is still possible that there could be genetic mutations that are undetectable by current technology, or genetic mutations in genes that have not been tested or identified to increase cancer risk.   This result means Lynch Syndrome is unlikely. This result is consistent with a sporadic cause for her cancer.  Tumor BRAF and/or MLH1 hypermethylation testing, however, could confirm this a sporadic cause.  There is always the small chance that this tumor could be caused by a Lynch syndrome mutation that is simply undetectable by current technology.  We recommend discussing the option of further confirmation testing with her oncology team.   It is recommended she continue to follow the cancer management and screening guidelines provided by her oncology and primary healthcare provider. An individual's cancer risk is not determined by genetic test results alone.  Overall cancer risk assessment includes  additional factors such as personal medical history, family history, etc.  These should be used to make a personalized plan for cancer prevention and surveillance.    RECOMMENDATIONS FOR FAMILY MEMBERS:  Relatives in this family might be at some increased risk of developing cancer, over the general population risk, simply due to the family history of cancer.  We recommended women in this family have a yearly mammogram beginning at age 63, or 76 years younger than the earliest onset of cancer, an annual clinical breast exam, and perform monthly breast self-exams. Women in this family should also have a gynecological exam as recommended by their primary provider. All family members should have a colonoscopy by age 31 (or as directed by their doctors).  All family members should inform their physicians about the family history of cancer so their doctors can make the most appropriate screening recommendations for them.   FOLLOW-UP: Lastly, we discussed with Ms. Hufstedler that cancer genetics is a rapidly advancing field and it is possible that new genetic tests will be appropriate for her and/or her family members in the future. We encouraged her to remain in contact with cancer genetics on an annual basis so we can update her personal and family histories and let her know of advances in cancer genetics that may benefit this family.   Our contact number was provided. Ms. Benjaman Kindler questions were answered to her satisfaction, and she knows she is welcome to call us at anytime with additional questions or concerns.   Ferol Luz, MS, [facility] Certified Genetic Counselor [email or web address]

## 2017-08-30 ENCOUNTER — Telehealth: Payer: Self-pay

## 2017-08-30 ENCOUNTER — Inpatient Hospital Stay (HOSPITAL_BASED_OUTPATIENT_CLINIC_OR_DEPARTMENT_OTHER): Payer: 59 | Admitting: Hematology and Oncology

## 2017-08-30 ENCOUNTER — Telehealth: Payer: Self-pay | Admitting: Hematology and Oncology

## 2017-08-30 ENCOUNTER — Inpatient Hospital Stay: Payer: 59

## 2017-08-30 ENCOUNTER — Encounter: Payer: Self-pay | Admitting: Hematology and Oncology

## 2017-08-30 VITALS — BP 104/49 | HR 71 | Temp 99.5°F | Resp 18 | Ht 60.0 in | Wt 166.0 lb

## 2017-08-30 DIAGNOSIS — Z90710 Acquired absence of both cervix and uterus: Secondary | ICD-10-CM | POA: Diagnosis not present

## 2017-08-30 DIAGNOSIS — C7801 Secondary malignant neoplasm of right lung: Secondary | ICD-10-CM

## 2017-08-30 DIAGNOSIS — C7800 Secondary malignant neoplasm of unspecified lung: Secondary | ICD-10-CM

## 2017-08-30 DIAGNOSIS — Z79899 Other long term (current) drug therapy: Secondary | ICD-10-CM | POA: Diagnosis not present

## 2017-08-30 DIAGNOSIS — C541 Malignant neoplasm of endometrium: Secondary | ICD-10-CM

## 2017-08-30 DIAGNOSIS — Z923 Personal history of irradiation: Secondary | ICD-10-CM

## 2017-08-30 DIAGNOSIS — E038 Other specified hypothyroidism: Secondary | ICD-10-CM

## 2017-08-30 DIAGNOSIS — Z5112 Encounter for antineoplastic immunotherapy: Secondary | ICD-10-CM | POA: Diagnosis not present

## 2017-08-30 DIAGNOSIS — E039 Hypothyroidism, unspecified: Secondary | ICD-10-CM | POA: Diagnosis not present

## 2017-08-30 DIAGNOSIS — Z90722 Acquired absence of ovaries, bilateral: Secondary | ICD-10-CM

## 2017-08-30 LAB — CMP (CANCER CENTER ONLY)
ALT: 17 U/L (ref 0–55)
ANION GAP: 10 (ref 3–11)
AST: 17 U/L (ref 5–34)
Albumin: 3.8 g/dL (ref 3.5–5.0)
Alkaline Phosphatase: 76 U/L (ref 40–150)
BUN: 18 mg/dL (ref 7–26)
CHLORIDE: 106 mmol/L (ref 98–109)
CO2: 23 mmol/L (ref 22–29)
Calcium: 9.1 mg/dL (ref 8.4–10.4)
Creatinine: 0.86 mg/dL (ref 0.60–1.10)
Glucose, Bld: 89 mg/dL (ref 70–140)
POTASSIUM: 4.4 mmol/L (ref 3.5–5.1)
Sodium: 139 mmol/L (ref 136–145)
Total Bilirubin: 0.6 mg/dL (ref 0.2–1.2)
Total Protein: 6.7 g/dL (ref 6.4–8.3)

## 2017-08-30 LAB — CBC WITH DIFFERENTIAL (CANCER CENTER ONLY)
Basophils Absolute: 0.1 10*3/uL (ref 0.0–0.1)
Basophils Relative: 2 %
Eosinophils Absolute: 0.2 10*3/uL (ref 0.0–0.5)
Eosinophils Relative: 6 %
HCT: 38 % (ref 34.8–46.6)
HEMOGLOBIN: 12.8 g/dL (ref 11.6–15.9)
LYMPHS ABS: 0.5 10*3/uL — AB (ref 0.9–3.3)
LYMPHS PCT: 12 %
MCH: 31 pg (ref 25.1–34.0)
MCHC: 33.8 g/dL (ref 31.5–36.0)
MCV: 91.8 fL (ref 79.5–101.0)
Monocytes Absolute: 0.4 10*3/uL (ref 0.1–0.9)
Monocytes Relative: 11 %
NEUTROS PCT: 69 %
Neutro Abs: 2.6 10*3/uL (ref 1.5–6.5)
Platelet Count: 250 10*3/uL (ref 145–400)
RBC: 4.14 MIL/uL (ref 3.70–5.45)
RDW: 14 % (ref 11.2–14.5)
WBC: 3.8 10*3/uL — AB (ref 3.9–10.3)

## 2017-08-30 LAB — TSH: TSH: 2.857 u[IU]/mL (ref 0.308–3.960)

## 2017-08-30 MED ORDER — PEMBROLIZUMAB CHEMO INJECTION 100 MG/4ML
200.0000 mg | Freq: Once | INTRAVENOUS | Status: AC
  Administered 2017-08-30: 200 mg via INTRAVENOUS
  Filled 2017-08-30: qty 8

## 2017-08-30 MED ORDER — SODIUM CHLORIDE 0.9 % IV SOLN
Freq: Once | INTRAVENOUS | Status: AC
  Administered 2017-08-30: 11:00:00 via INTRAVENOUS

## 2017-08-30 NOTE — Telephone Encounter (Signed)
Called and left below message. Instructed to call office for questions. 

## 2017-08-30 NOTE — Telephone Encounter (Signed)
-----   Message from Heath Lark, MD sent at 08/30/2017 11:08 AM EDT ----- Regarding: TSH ok Let her know TSH is ok She is getting chemo ----- Message ----- From: Interface, Lab In [facility] Sent: 08/30/2017  10:00 AM To: Heath Lark, MD

## 2017-08-30 NOTE — Progress Notes (Signed)
[place] OFFICE PROGRESS NOTE  Patient Care Team: [name], MD as PCP - General (Internal Medicine) Jacelyn Pi, MD as Consulting Physician (Endocrinology)  ASSESSMENT & PLAN:  Endometrial cancer [facility]) She tolerated treatment well except for some mild edema from fluid retention and arthritis pain Her arthritis pain is well controlled with ibuprofen I recommend reduce use of ibuprofen due to fluid retention We will be monitoring TSH carefully I plan minimum 3 cycles of treatment before repeat imaging study in July She agreed with the plan of care  Metastasis to lung [facility]) She is not symptomatic with chest pain or shortness of breath or cough  Observe only with plan to repeat CT next month  Hypothyroidism She has chronic hypothyroidism that could be affected by treatment We will call the patient with results of TSH and will consult with her endocrinologist, Dr. Chalmers Cater if dose adjustment is needed   Orders Placed This Encounter  Procedures  . CT CHEST W CONTRAST    Standing Status:   Future    Standing Expiration Date:   08/31/2018    Order Specific Question:   If indicated for the ordered procedure, I authorize the administration of contrast media per Radiology protocol    Answer:   Yes    Order Specific Question:   Preferred imaging location?    Answer:   [facility]    Order Specific Question:   Radiology Contrast Protocol - do NOT remove file path    Answer:   [email or web address]    INTERVAL HISTORY: Please see below for problem oriented charting. She returns to be seen prior to cycle 2 of treatment With cycle 1, she complained of diffuse joint pain and arthritis She has been taking over-the-counter analgesics of Tylenol and ibuprofen She complained of mild fluid retention Denies cough, chest pain or shortness of breath  SUMMARY OF ONCOLOGIC HISTORY: Oncology History   Abnormal MSI, germline mutation was  negative Endometrioid     Endometrial cancer ([place])   09/15/2014 Pathology Results    Endometrium, curettage - ENDOMETRIAL ADENOCARCINOMA. - SEE COMMENT. Microscopic Comment Although definitive characterization is best performed on resection specimen, as sampled, the endometrial adenocarcinoma appears to be endometrioid subtype, FIGO grade 1.       09/15/2014 Surgery    PreOp: postmenopausal bleeding, cervical stenosis PostOp: same and uterine polyp Procedure:  Hysteroscopy, Dilation and Curettage, Myosure polypectomy Surgeon: Dr. Janyth Pupa  Findings:8cm uterus with thickened polypoid-like endometrium, large 2cm irregular appearing polyp  Specimens: 1) endometrial curettings      10/26/2014 Pathology Results    1. Uterus +/- tubes/ovaries, neoplastic - INVASIVE ADENOCARCINOMA, ENDOMETRIOID TYPE, SEE COMMENT. - TUMOR INVOLVES LESS THAN ONE HALF MYOMETRIAL THICKNESS. - TUMOR INVOLVES UTERINE ADENOMYOSIS. - BENIGN LEIOMYOMATA (UP TO 2.5 CM). - BENIGN CERVICAL MUCOSA; NEGATIVE FOR INTRAEPITHELIAL LESION OR MALIGNANCY. - BENIGN RIGHT AND LEFT OVARIES; NEGATIVE FOR ATYPIA OR MALIGNANCY. - BENIGN RIGHT AND LEFT FALLOPIAN TUBE; NEGATIVE FOR ATYPIA OR MALIGNANCY. - BENIGN PARATUBAL CYSTS; NEGATIVE FOR ATYPIA OR MALIGNANCY. - SEE TUMOR SYNOPTIC TEMPLATE BELOW. 2. Lymph nodes, regional resection, right pelvic - FOUR LYMPH NODES, NEGATIVE FOR TUMOR (0/4). 3. Lymph nodes, regional resection, left pelvic - FOUR LYMPH NODES, NEGATIVE FOR TUMOR (0/4). Microscopic Comment 1. ONCOLOGY TABLE-UTERUS, CARCINOMA OR CARCINOSARCOMA Specimen: Uterus and bilateral fallopian tubes and ovaries. Procedure: Hysterectomy and bilateral salpingo-oophorectomy. Lymph node sampling performed: Yes. Specimen integrity: Intact. Maximum tumor size: 4.0 cm (tumor involved entire endometrium) Histologic type: Adenocarcinoma, endometrioid type. Grade:  1 Myometrial invasion: 0.5 cm where myometrium is 1.5 cm in  thickness Cervical stromal involvement: Absent. Extent of involvement of other organs: None Lymph - vascular invasion: Absent. Peritoneal washings: N/A Lymph nodes: # examined - 8 ; # positive - 0 Pelvic lymph nodes: N/A involved of N/A lymph nodes. Para-aortic lymph nodes: N/A involved of N/A lymph nodes. Other (specify involvement and site): N/A TNM code: pT1a, pN0 FIGO Stage (based on pathologic findings, needs clinical correlation): N/A Comments: None MSI testing abnormal      10/26/2014 Surgery    Surgery: Total robotic hysterectomy, bilateral salpingo-oophorectomy, bilateral pelvic lymph node dissection.   Surgeons:  Lucita Lora. Alycia Rossetti, MD; Lahoma Crocker, MD   Pathology: Uterus, cervix, bilateral tubes and ovaries, bilateral pelvic lymph nodes to pathology.   Operative findings: Omentum adherent to midline vertical incision. Fibroid uterus. Normal adnexa. Adhesions of anterior bladder to uterus. Frozen section with grade 1 cancer focally involving adenomyosis, minimal myometrial invasion.      11/17/2014 Imaging    Ct abdomen 1. Status post hysterectomy. Pelvic edema which is greater than typically seen 3 weeks postop. Suspicious for postoperative infection. 2. Bladder wall thickening and irregularity. Although this could be partially due to underdistention, Concurrent cystitis cannot be excluded. 3. Bilateral fluid density lesions along the pelvic sidewalls. Favored to represent seromas or lymphangiomas. No specific features to suggest abscess. If the patient's symptoms persist after appropriate antibiotic therapy, aspiration of the largest left pelvic sidewall "Lesion" should be considered. 4. Suspicion of mild hepatic steatosis. Indeterminate right hepatic lobe lesion. If definitive characterization is desired in this patient with history of primary malignancy, nonemergent pre and post contrast abdominal MRI could be performed. 5. Hiatal hernia.      11/25/2014 Imaging     1. Persistent bilateral pelvic sidewall fluid collections, left greater than right, with left-sided inflammatory changes. 2. Technically successful 12 French left pelvic abscess drain catheter placement. A sample of the aspirate was sent for Gram stain, culture and sensitivity.      12/08/2014 Imaging    CT abdomen and pelvis 1. Complete resolution of dominant fluid collection within the left hemipelvis following percutaneous drainage catheter placement. This percutaneous drainage catheter was subsequent removed intact at the patient's bedside. 2. Continued decrease in size of bilobed fluid collections within the right hemipelvis with superficial component measuring 1.6 cm and posterior component measuring 2.6 cm, indeterminate though both favored to represent evolving seromas. 3. Unchanged indeterminate approximately 1.2 cm hepatic lesion. Further evaluation with contrast-enhanced abdominal MRI could be performed as clinically indicated. 4. Colonic diverticulosis without evidence of diverticulitis.      10/19/2016 Imaging    New 2.6 cm soft tissue mass involving right vaginal cuff, consistent with locally recurrent carcinoma.  No other sites of metastatic disease identified.  Colonic diverticulosis, without radiographic evidence of diverticulitis.  Mild hepatic steatosis.       10/24/2016 Relapse/Recurrence    Vaginal cuff recurrence. No evidence of distant disease.      10/24/2016 Pathology Results    Vagina, biopsy, right apex - ADENOCARCINOMA - SEE COMMENT Microscopic Comment The morphologic features are consistent with the patient's previously diagnosed endometrioid adenocarcinoma      11/08/2016 - 12/27/2016 Radiation Therapy    45 Gy in 25 fractions of 1.8 Gy. The residual pelvic mass was boosted to 14 Gy in 7 fractions of 2 Gy. No intracavitary lesion seen at completion of pelvic fields to boost with brachytherapy.      06/12/2017 Imaging  CT abdomen and pelvis 1. New  1.1 by 0.8 cm right lower lobe pulmonary nodule, concerning for pulmonary metastatic disease. Possibilities for further assessment include biopsy or nuclear medicine PET-CT to assess this lesion and the rest of the neck/chest/abdomen/pelvis for other potential hypermetabolic lesions. 2. The previous soft tissue mass along the right vaginal cuff is no longer present. 3. Stable and likely benign right hepatic lobe hypodense lesion. 4. Other imaging findings of potential clinical significance: Sigmoid colon diverticulosis. Aortic Atherosclerosis (ICD10-I70.0). Multilevel lumbar degenerative disc disease. Mild cardiomegaly. Small type 1 hiatal hernia.      07/05/2017 Imaging    Ct chest Several bilateral pulmonary nodules measuring up to 11 mm, highly suspicious for pulmonary metastases.   No evidence of lymphadenopathy or pleural effusion.  Aortic Atherosclerosis (ICD10-I70.0).      07/25/2017 Imaging    Status post CT-guided lung nodule biopsy. Tissue specimen sent to pathology for complete histopathologic analysis.      07/25/2017 Pathology Results    Lung, needle/core biopsy(ies), RLL - METASTATIC ADENOCARCINOMA - SEE COMMENT Microscopic Comment By immunohistochemistry, the neoplastic cells are positive for Pax-8 and ER but negative for TTF-1. Overall, this immunoprofile is consistent with metastasis from the patient's known endometrioid adenocarcinoma      07/30/2017 Cancer Staging    Staging form: Corpus Uteri - Carcinoma and Carcinosarcoma, AJCC 8th Edition - Clinical: Stage IVB (rcT1, cN0, pM1) - Signed by Heath Lark, MD on 07/30/2017      08/09/2017 -  Chemotherapy    The patient had pembrolizumab for chemotherapy treatment.        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        Metastasis to lung ([place])   07/30/2017 Initial Diagnosis    Metastasis to lung ([place])      08/02/2017 -  Chemotherapy    The patient had pembrolizumab (KEYTRUDA) 200 mg in sodium chloride 0.9 % 50 mL chemo infusion, 200 mg, Intravenous, Once, 2 of 4 cycles Administration: 200 mg (08/09/2017)  for chemotherapy treatment.        REVIEW OF SYSTEMS:   Constitutional: Denies fevers, chills or abnormal weight loss Eyes: Denies blurriness of vision Ears, nose, mouth, throat, and face: Denies mucositis or sore throat Respiratory: Denies cough, dyspnea or wheezes Cardiovascular: Denies palpitation, chest discomfort or lower extremity swelling Gastrointestinal:  Denies nausea, heartburn or change in bowel habits Skin: Denies abnormal skin rashes Lymphatics: Denies new lymphadenopathy or easy bruising Neurological:Denies numbness, tingling or new weaknesses Behavioral/Psych: Mood is stable, no new changes  All other systems were reviewed with the patient and are negative.  I have reviewed the past medical history, past surgical history, social history and family history with the patient and they are unchanged from previous note.  ALLERGIES:  is allergic to hydrocodone.  MEDICATIONS:  Current Outpatient Medications  Medication Sig Dispense Refill  . Calcium Carb-Cholecalciferol (CALCIUM 600 + D PO) Take 1 tablet by mouth daily.    Marland Kitchen FLUoxetine (PROZAC) 40 MG capsule Take 40 mg by mouth daily.    . furosemide (LASIX) 40 MG tablet Take 40 mg by mouth daily as needed for fluid.     Marland Kitchen levothyroxine (  SYNTHROID, LEVOTHROID) 88 MCG tablet Take 88 mcg by mouth daily before breakfast. Patient takes sat and sun    . naproxen sodium (ALEVE) 220 MG tablet Take 220 mg by mouth daily as needed (pain).     No current facility-administered medications for this visit.    Facility-Administered  Medications Ordered in Other Visits  Medication Dose Route Frequency Provider Last Rate Last Dose  . 0.9 %  sodium chloride infusion   Intravenous Once Alvy Bimler, Vinay Ertl, MD      . pembrolizumab (KEYTRUDA) 200 mg in sodium chloride 0.9 % 50 mL chemo infusion  200 mg Intravenous Once Alvy Bimler, Shayra Anton, MD        PHYSICAL EXAMINATION: ECOG PERFORMANCE STATUS: 1 - Symptomatic but completely ambulatory  Vitals:   08/30/17 0959  BP: (!) 104/49  Pulse: 71  Resp: 18  Temp: 99.5 F (37.5 C)  SpO2: 99%   Filed Weights   08/30/17 0959  Weight: 166 lb (75.3 kg)    GENERAL:alert, no distress and comfortable SKIN: skin color, texture, turgor are normal, no rashes or significant lesions EYES: normal, Conjunctiva are pink and non-injected, sclera clear OROPHARYNX:no exudate, no erythema and lips, buccal mucosa, and tongue normal  NECK: supple, thyroid normal size, non-tender, without nodularity LYMPH:  no palpable lymphadenopathy in the cervical, axillary or inguinal LUNGS: clear to auscultation and percussion with normal breathing effort HEART: regular rate & rhythm and no murmurs and no lower extremity edema ABDOMEN:abdomen soft, non-tender and normal bowel sounds Musculoskeletal:no cyanosis of digits and no clubbing  NEURO: alert & oriented x 3 with fluent speech, no focal motor/sensory deficits  LABORATORY DATA:  I have reviewed the data as listed    Component Value Date/Time   NA 139 08/30/2017 0942   K 4.4 08/30/2017 0942   CL 106 08/30/2017 0942   CO2 23 08/30/2017 0942   GLUCOSE 89 08/30/2017 0942   BUN 18 08/30/2017 0942   BUN 18.7 10/17/2016 1353   CREATININE 0.86 08/30/2017 0942   CREATININE 0.8 10/17/2016 1353   CALCIUM 9.1 08/30/2017 0942   PROT 6.7 08/30/2017 0942   ALBUMIN 3.8 08/30/2017 0942   AST 17 08/30/2017 0942   ALT 17 08/30/2017 0942   ALKPHOS 76 08/30/2017 0942   BILITOT 0.6 08/30/2017 0942   GFRNONAA >60 08/30/2017 0942   GFRAA >60 08/30/2017 0942    No  results found for: SPEP, UPEP  Lab Results  Component Value Date   WBC 3.8 (L) 08/30/2017   NEUTROABS 2.6 08/30/2017   HGB 12.8 08/30/2017   HCT 38.0 08/30/2017   MCV 91.8 08/30/2017   PLT 250 08/30/2017      Chemistry      Component Value Date/Time   NA 139 08/30/2017 0942   K 4.4 08/30/2017 0942   CL 106 08/30/2017 0942   CO2 23 08/30/2017 0942   BUN 18 08/30/2017 0942   BUN 18.7 10/17/2016 1353   CREATININE 0.86 08/30/2017 0942   CREATININE 0.8 10/17/2016 1353      Component Value Date/Time   CALCIUM 9.1 08/30/2017 0942   ALKPHOS 76 08/30/2017 0942   AST 17 08/30/2017 0942   ALT 17 08/30/2017 0942   BILITOT 0.6 08/30/2017 0942     All questions were answered. The patient knows to call the clinic with any problems, questions or concerns. No barriers to learning was detected.  I spent 15 minutes counseling the patient face to face. The total time spent in the appointment was 20 minutes and  more than 50% was on counseling and review of test results  Heath Lark, MD 08/30/2017 11:06 AM

## 2017-08-30 NOTE — Assessment & Plan Note (Signed)
She is not symptomatic with chest pain or shortness of breath or cough  Observe only with plan to repeat CT next month

## 2017-08-30 NOTE — Telephone Encounter (Signed)
Gave avs and calendar ° °

## 2017-08-30 NOTE — Patient Instructions (Signed)
[facility] Discharge Instructions for Patients Receiving Chemotherapy  Today you received the following chemotherapy agents:  Keytruda.  To help prevent nausea and vomiting after your treatment, we encourage you to take your nausea medication as directed.   If you develop nausea and vomiting that is not controlled by your nausea medication, call the clinic.   BELOW ARE SYMPTOMS THAT SHOULD BE REPORTED IMMEDIATELY:  *FEVER GREATER THAN 100.5 F  *CHILLS WITH OR WITHOUT FEVER  NAUSEA AND VOMITING THAT IS NOT CONTROLLED WITH YOUR NAUSEA MEDICATION  *UNUSUAL SHORTNESS OF BREATH  *UNUSUAL BRUISING OR BLEEDING  TENDERNESS IN MOUTH AND THROAT WITH OR WITHOUT PRESENCE OF ULCERS  *URINARY PROBLEMS  *BOWEL PROBLEMS  UNUSUAL RASH Items with * indicate a potential emergency and should be followed up as soon as possible.  Feel free to call the clinic should you have any questions or concerns. The clinic phone number is [phone].  Please show the CHEMO ALERT CARD at check-in to the Emergency Department and triage nurse.    

## 2017-08-30 NOTE — Assessment & Plan Note (Signed)
She has chronic hypothyroidism that could be affected by treatment We will call the patient with results of TSH and will consult with her endocrinologist, Dr. Chalmers Cater if dose adjustment is needed

## 2017-08-30 NOTE — Assessment & Plan Note (Signed)
She tolerated treatment well except for some mild edema from fluid retention and arthritis pain Her arthritis pain is well controlled with ibuprofen I recommend reduce use of ibuprofen due to fluid retention We will be monitoring TSH carefully I plan minimum 3 cycles of treatment before repeat imaging study in July She agreed with the plan of care

## 2017-09-13 ENCOUNTER — Telehealth: Payer: Self-pay | Admitting: *Deleted

## 2017-09-13 NOTE — Telephone Encounter (Signed)
Faxed ROI to [employer]; release [phone]

## 2017-09-20 ENCOUNTER — Inpatient Hospital Stay: Payer: 59

## 2017-09-20 VITALS — BP 102/58 | HR 70 | Temp 98.2°F | Resp 18

## 2017-09-20 DIAGNOSIS — E038 Other specified hypothyroidism: Secondary | ICD-10-CM

## 2017-09-20 DIAGNOSIS — C7800 Secondary malignant neoplasm of unspecified lung: Secondary | ICD-10-CM

## 2017-09-20 DIAGNOSIS — C541 Malignant neoplasm of endometrium: Secondary | ICD-10-CM

## 2017-09-20 DIAGNOSIS — Z5112 Encounter for antineoplastic immunotherapy: Secondary | ICD-10-CM | POA: Diagnosis not present

## 2017-09-20 LAB — COMPREHENSIVE METABOLIC PANEL
ALT: 19 U/L (ref 0–44)
AST: 20 U/L (ref 15–41)
Albumin: 3.5 g/dL (ref 3.5–5.0)
Alkaline Phosphatase: 57 U/L (ref 38–126)
Anion gap: 7 (ref 5–15)
BUN: 14 mg/dL (ref 8–23)
CHLORIDE: 110 mmol/L (ref 98–111)
CO2: 26 mmol/L (ref 22–32)
CREATININE: 0.82 mg/dL (ref 0.44–1.00)
Calcium: 9 mg/dL (ref 8.9–10.3)
GFR calc Af Amer: >60 mL/min (ref >60)
GFR calc non Af Amer: >60 mL/min (ref >60)
Glucose, Bld: 86 mg/dL (ref 70–99)
Potassium: 3.6 mmol/L (ref 3.5–5.1)
SODIUM: 143 mmol/L (ref 135–145)
Total Bilirubin: 0.7 mg/dL (ref 0.3–1.2)
Total Protein: 6.3 g/dL — ABNORMAL LOW (ref 6.5–8.1)

## 2017-09-20 LAB — TSH: TSH: 5.384 u[IU]/mL — AB (ref 0.308–3.960)

## 2017-09-20 LAB — CBC WITH DIFFERENTIAL (CANCER CENTER ONLY)
Basophils Absolute: 0.1 10*3/uL (ref 0.0–0.1)
Basophils Relative: 3 %
EOS ABS: 0.1 10*3/uL (ref 0.0–0.5)
Eosinophils Relative: 6 %
HEMATOCRIT: 34.6 % — AB (ref 34.8–46.6)
HEMOGLOBIN: 11.7 g/dL (ref 11.6–15.9)
LYMPHS ABS: 0.3 10*3/uL — AB (ref 0.9–3.3)
Lymphocytes Relative: 14 %
MCH: 30.7 pg (ref 25.1–34.0)
MCHC: 33.8 g/dL (ref 31.5–36.0)
MCV: 90.6 fL (ref 79.5–101.0)
Monocytes Absolute: 0.4 10*3/uL (ref 0.1–0.9)
Monocytes Relative: 19 %
NEUTROS PCT: 58 %
Neutro Abs: 1.3 10*3/uL — ABNORMAL LOW (ref 1.5–6.5)
Platelet Count: 225 10*3/uL (ref 145–400)
RBC: 3.81 MIL/uL (ref 3.70–5.45)
RDW: 14 % (ref 11.2–14.5)
WBC: 2.3 10*3/uL — AB (ref 3.9–10.3)

## 2017-09-20 MED ORDER — SODIUM CHLORIDE 0.9 % IV SOLN
Freq: Once | INTRAVENOUS | Status: AC
  Administered 2017-09-20: 13:00:00 via INTRAVENOUS

## 2017-09-20 MED ORDER — PEMBROLIZUMAB CHEMO INJECTION 100 MG/4ML
200.0000 mg | Freq: Once | INTRAVENOUS | Status: AC
  Administered 2017-09-20: 200 mg via INTRAVENOUS
  Filled 2017-09-20: qty 8

## 2017-09-20 NOTE — Progress Notes (Signed)
OK to treat with ANC 1.3 per NP Suzan Garibaldi

## 2017-09-20 NOTE — Patient Instructions (Signed)
[facility] Discharge Instructions for Patients Receiving Chemotherapy  Today you received the following chemotherapy agent: Keytruda.  To help prevent nausea and vomiting after your treatment, we encourage you to take your nausea medication as directed.   If you develop nausea and vomiting that is not controlled by your nausea medication, call the clinic.   BELOW ARE SYMPTOMS THAT SHOULD BE REPORTED IMMEDIATELY:  *FEVER GREATER THAN 100.5 F  *CHILLS WITH OR WITHOUT FEVER  NAUSEA AND VOMITING THAT IS NOT CONTROLLED WITH YOUR NAUSEA MEDICATION  *UNUSUAL SHORTNESS OF BREATH  *UNUSUAL BRUISING OR BLEEDING  TENDERNESS IN MOUTH AND THROAT WITH OR WITHOUT PRESENCE OF ULCERS  *URINARY PROBLEMS  *BOWEL PROBLEMS  UNUSUAL RASH Items with * indicate a potential emergency and should be followed up as soon as possible.  Feel free to call the clinic should you have any questions or concerns. The clinic phone number is [phone].  Please show the CHEMO ALERT CARD at check-in to the Emergency Department and triage nurse.   

## 2017-09-23 ENCOUNTER — Telehealth: Payer: Self-pay

## 2017-09-23 NOTE — Telephone Encounter (Signed)
She called and left a message. Requesting we fax TSH results to Dr. Chalmers Cater. Faxed to lab to [phone].

## 2017-09-23 NOTE — Telephone Encounter (Signed)
Called and given below message. She verbalized understanding. She sees a endocrinologist for her thyroid and will contact them regarding  TSH level.  Her next treatment is 7/19. She has a sore throat with congestion. She is nasally today and has some hoarseness. Denies fever. She worked in the yard yesterday. The only thing she is taking now is [place] cough drops. She does want to delay her treatment.

## 2017-09-23 NOTE — Telephone Encounter (Signed)
Called and left below message. Instructed to call for questions. 

## 2017-09-23 NOTE — Telephone Encounter (Signed)
-----   Message from Heath Lark, MD sent at 09/23/2017 11:02 AM EDT ----- Regarding: thyroid Her TSH is high meaning her thyroid medication needs to go up I suggest 100 mcg I can prescribe or she can call her PCP to do. Can you ask what she would want [name] to do? If she wants me to do it, call in 30 tabs, 3 refills

## 2017-09-23 NOTE — Telephone Encounter (Signed)
I recommend OTC antihistamines Her next treatment is on 7/19; that's more than 2 weeks away. I do not understand what she meant by delaying her treatment?

## 2017-10-07 ENCOUNTER — Telehealth: Payer: Self-pay

## 2017-10-07 NOTE — Telephone Encounter (Signed)
Pt called reporting rash on back and flank area, persistant cough and diarrhea.  Itchy large red bumps, no drainage.  Diarrhea 3-4 times/day loose to liquid stool. Cough x 3 weeks, mostly dry, some congestion.  Pt has been using nasal spray. No fevers. Per Dr Alvy Bimler, pt to take Benadryl 25mg  TID, immodium for diarrhea and continue nasal spray. Pt given these instructions along with can use hydrocortisone cream on rash.  Pt instructed to let us know if no improvement by Wednesday and we can see her before or after her scheduled CT scan. Pt verbalizes understanding.

## 2017-10-09 ENCOUNTER — Ambulatory Visit (HOSPITAL_COMMUNITY)
Admission: RE | Admit: 2017-10-09 | Discharge: 2017-10-09 | Disposition: A | Discharge status: Home / Self Care | Payer: 59 | Source: Ambulatory Visit | Attending: Hematology and Oncology | Admitting: Hematology and Oncology

## 2017-10-09 DIAGNOSIS — I700 Atherosclerosis of aorta: Secondary | ICD-10-CM | POA: Insufficient documentation

## 2017-10-09 DIAGNOSIS — C7800 Secondary malignant neoplasm of unspecified lung: Secondary | ICD-10-CM | POA: Diagnosis present

## 2017-10-09 DIAGNOSIS — R918 Other nonspecific abnormal finding of lung field: Secondary | ICD-10-CM | POA: Insufficient documentation

## 2017-10-09 DIAGNOSIS — C541 Malignant neoplasm of endometrium: Secondary | ICD-10-CM

## 2017-10-09 DIAGNOSIS — R590 Localized enlarged lymph nodes: Secondary | ICD-10-CM | POA: Insufficient documentation

## 2017-10-09 MED ORDER — IOHEXOL 300 MG/ML  SOLN
75.0000 mL | Freq: Once | INTRAMUSCULAR | Status: AC | PRN
  Administered 2017-10-09: 75 mL via INTRAVENOUS

## 2017-10-11 ENCOUNTER — Telehealth: Payer: Self-pay | Admitting: Hematology and Oncology

## 2017-10-11 ENCOUNTER — Inpatient Hospital Stay: Payer: 59 | Attending: Gynecologic Oncology

## 2017-10-11 ENCOUNTER — Inpatient Hospital Stay (HOSPITAL_BASED_OUTPATIENT_CLINIC_OR_DEPARTMENT_OTHER): Payer: 59 | Admitting: Hematology and Oncology

## 2017-10-11 ENCOUNTER — Inpatient Hospital Stay: Payer: 59

## 2017-10-11 ENCOUNTER — Encounter: Payer: Self-pay | Admitting: Hematology and Oncology

## 2017-10-11 ENCOUNTER — Telehealth: Payer: Self-pay | Admitting: *Deleted

## 2017-10-11 DIAGNOSIS — Z90722 Acquired absence of ovaries, bilateral: Secondary | ICD-10-CM

## 2017-10-11 DIAGNOSIS — R21 Rash and other nonspecific skin eruption: Secondary | ICD-10-CM | POA: Insufficient documentation

## 2017-10-11 DIAGNOSIS — R590 Localized enlarged lymph nodes: Secondary | ICD-10-CM

## 2017-10-11 DIAGNOSIS — J329 Chronic sinusitis, unspecified: Secondary | ICD-10-CM | POA: Insufficient documentation

## 2017-10-11 DIAGNOSIS — E039 Hypothyroidism, unspecified: Secondary | ICD-10-CM | POA: Diagnosis not present

## 2017-10-11 DIAGNOSIS — Z90710 Acquired absence of both cervix and uterus: Secondary | ICD-10-CM | POA: Diagnosis not present

## 2017-10-11 DIAGNOSIS — C541 Malignant neoplasm of endometrium: Secondary | ICD-10-CM | POA: Diagnosis not present

## 2017-10-11 DIAGNOSIS — Z79899 Other long term (current) drug therapy: Secondary | ICD-10-CM | POA: Insufficient documentation

## 2017-10-11 DIAGNOSIS — J0110 Acute frontal sinusitis, unspecified: Secondary | ICD-10-CM

## 2017-10-11 DIAGNOSIS — Z923 Personal history of irradiation: Secondary | ICD-10-CM | POA: Insufficient documentation

## 2017-10-11 DIAGNOSIS — C7801 Secondary malignant neoplasm of right lung: Secondary | ICD-10-CM | POA: Diagnosis not present

## 2017-10-11 DIAGNOSIS — Z5112 Encounter for antineoplastic immunotherapy: Secondary | ICD-10-CM | POA: Insufficient documentation

## 2017-10-11 DIAGNOSIS — E038 Other specified hypothyroidism: Secondary | ICD-10-CM

## 2017-10-11 DIAGNOSIS — C7800 Secondary malignant neoplasm of unspecified lung: Secondary | ICD-10-CM

## 2017-10-11 LAB — CBC WITH DIFFERENTIAL (CANCER CENTER ONLY)
BASOS ABS: 0.1 10*3/uL (ref 0.0–0.1)
BASOS PCT: 2 %
EOS ABS: 0.2 10*3/uL (ref 0.0–0.5)
Eosinophils Relative: 6 %
HCT: 38.4 % (ref 34.8–46.6)
Hemoglobin: 12.8 g/dL (ref 11.6–15.9)
Lymphocytes Relative: 14 %
Lymphs Abs: 0.5 10*3/uL — ABNORMAL LOW (ref 0.9–3.3)
MCH: 30 pg (ref 25.1–34.0)
MCHC: 33.3 g/dL (ref 31.5–36.0)
MCV: 90.1 fL (ref 79.5–101.0)
MONO ABS: 0.6 10*3/uL (ref 0.1–0.9)
MONOS PCT: 15 %
Neutro Abs: 2.5 10*3/uL (ref 1.5–6.5)
Neutrophils Relative %: 63 %
Platelet Count: 245 10*3/uL (ref 145–400)
RBC: 4.26 MIL/uL (ref 3.70–5.45)
RDW: 14.2 % (ref 11.2–14.5)
WBC Count: 3.9 10*3/uL (ref 3.9–10.3)

## 2017-10-11 LAB — CMP (CANCER CENTER ONLY)
ALT: 17 U/L (ref 0–44)
AST: 13 U/L — ABNORMAL LOW (ref 15–41)
Albumin: 3.8 g/dL (ref 3.5–5.0)
Alkaline Phosphatase: 63 U/L (ref 38–126)
Anion gap: 8 (ref 5–15)
BILIRUBIN TOTAL: 0.7 mg/dL (ref 0.3–1.2)
BUN: 12 mg/dL (ref 8–23)
CALCIUM: 9.5 mg/dL (ref 8.9–10.3)
CO2: 24 mmol/L (ref 22–32)
CREATININE: 0.83 mg/dL (ref 0.44–1.00)
Chloride: 108 mmol/L (ref 98–111)
GFR, Est AFR Am: >60 mL/min (ref >60)
GFR, Estimated: >60 mL/min (ref >60)
Glucose, Bld: 85 mg/dL (ref 70–99)
Potassium: 3.5 mmol/L (ref 3.5–5.1)
SODIUM: 140 mmol/L (ref 135–145)
TOTAL PROTEIN: 6.7 g/dL (ref 6.5–8.1)

## 2017-10-11 LAB — TSH: TSH: 2.525 u[IU]/mL (ref 0.308–3.960)

## 2017-10-11 MED ORDER — SODIUM CHLORIDE 0.9 % IV SOLN
Freq: Once | INTRAVENOUS | Status: AC
  Administered 2017-10-11: 14:00:00 via INTRAVENOUS

## 2017-10-11 MED ORDER — SODIUM CHLORIDE 0.9 % IV SOLN
200.0000 mg | Freq: Once | INTRAVENOUS | Status: AC
  Administered 2017-10-11: 200 mg via INTRAVENOUS
  Filled 2017-10-11: qty 8

## 2017-10-11 MED ORDER — AMOXICILLIN-POT CLAVULANATE 875-125 MG PO TABS: 1.0000 | ORAL_TABLET | Freq: Two times a day (BID) | ORAL | 0 refills | Status: DC

## 2017-10-11 NOTE — Telephone Encounter (Signed)
Gave avs and calendar ° °

## 2017-10-11 NOTE — Assessment & Plan Note (Addendum)
She has minor skin rash It is unlikely caused by immunotherapy I recommend conservative management with topical steroid cream and Benadryl as needed

## 2017-10-11 NOTE — Patient Instructions (Signed)
[facility] Discharge Instructions for Patients Receiving Chemotherapy  Today you received the following chemotherapy agents :  Keytruda.  To help prevent nausea and vomiting after your treatment, we encourage you to take your nausea medication as prescribed.   If you develop nausea and vomiting that is not controlled by your nausea medication, call the clinic.   BELOW ARE SYMPTOMS THAT SHOULD BE REPORTED IMMEDIATELY:  *FEVER GREATER THAN 100.5 F  *CHILLS WITH OR WITHOUT FEVER  NAUSEA AND VOMITING THAT IS NOT CONTROLLED WITH YOUR NAUSEA MEDICATION  *UNUSUAL SHORTNESS OF BREATH  *UNUSUAL BRUISING OR BLEEDING  TENDERNESS IN MOUTH AND THROAT WITH OR WITHOUT PRESENCE OF ULCERS  *URINARY PROBLEMS  *BOWEL PROBLEMS  UNUSUAL RASH Items with * indicate a potential emergency and should be followed up as soon as possible.  Feel free to call the clinic should you have any questions or concerns. The clinic phone number is [phone].  Please show the CHEMO ALERT CARD at check-in to the Emergency Department and triage nurse.  

## 2017-10-11 NOTE — Assessment & Plan Note (Signed)
She has acquired hypothyroidism TSH today is satisfactory

## 2017-10-11 NOTE — Telephone Encounter (Signed)
LM with note below 

## 2017-10-11 NOTE — Assessment & Plan Note (Signed)
She has recent sinus congestion I recommend antibiotic treatment

## 2017-10-11 NOTE — Assessment & Plan Note (Signed)
She has mild lymphadenopathy of unknown etiology I recommend antibiotic treatment given her symptoms of sinus congestion I plan to repeat imaging study again in 2-1/2 months

## 2017-10-11 NOTE — Progress Notes (Signed)
[place] OFFICE PROGRESS NOTE  Patient Care Team: [name], MD as PCP - General (Internal Medicine) Jacelyn Pi, MD as Consulting Physician (Endocrinology)  ASSESSMENT & PLAN:  Endometrial cancer [facility]) I have reviewed CT imaging with the patient and family members Even though there are evidence of mild progression lymphadenopathy in the mediastinum, she had symptoms suspicious for infection recently The lung nodules are improving I recommend we proceed with treatment without delay I plan to repeat imaging study again after 3 more cycles of treatment  Metastasis to lung [facility]) She is not symptomatic The lung nodules are improving We will continue treatment without dose adjustment  Hypothyroidism She has acquired hypothyroidism TSH today is satisfactory  Mediastinal lymphadenopathy She has mild lymphadenopathy of unknown etiology I recommend antibiotic treatment given her symptoms of sinus congestion I plan to repeat imaging study again in 2-1/2 months  Skin rash She has minor skin rash It is unlikely caused by immunotherapy I recommend conservative management with topical steroid cream and Benadryl as needed  Sinusitis She has recent sinus congestion I recommend antibiotic treatment   No orders of the defined types were placed in this encounter.   INTERVAL HISTORY: Please see below for problem oriented charting. She returns with multiple family members for further follow-up She complained of persistent skin rash but overall much improved She complained of excessive fatigue She have intermittent diarrhea, resolved without Imodium She has some sinus congestion and cough Denies significant fever or chills No recent nausea, vomiting or changes in bowel habits Overall, she tolerated treatment very well  SUMMARY OF ONCOLOGIC HISTORY: Oncology History   Abnormal MSI, germline mutation was negative Endometrioid     Endometrial cancer ([place])    09/15/2014 Pathology Results    Endometrium, curettage - ENDOMETRIAL ADENOCARCINOMA. - SEE COMMENT. Microscopic Comment Although definitive characterization is best performed on resection specimen, as sampled, the endometrial adenocarcinoma appears to be endometrioid subtype, FIGO grade 1.       09/15/2014 Surgery    PreOp: postmenopausal bleeding, cervical stenosis PostOp: same and uterine polyp Procedure:  Hysteroscopy, Dilation and Curettage, Myosure polypectomy Surgeon: Dr. Janyth Pupa  Findings:8cm uterus with thickened polypoid-like endometrium, large 2cm irregular appearing polyp  Specimens: 1) endometrial curettings      10/26/2014 Pathology Results    1. Uterus +/- tubes/ovaries, neoplastic - INVASIVE ADENOCARCINOMA, ENDOMETRIOID TYPE, SEE COMMENT. - TUMOR INVOLVES LESS THAN ONE HALF MYOMETRIAL THICKNESS. - TUMOR INVOLVES UTERINE ADENOMYOSIS. - BENIGN LEIOMYOMATA (UP TO 2.5 CM). - BENIGN CERVICAL MUCOSA; NEGATIVE FOR INTRAEPITHELIAL LESION OR MALIGNANCY. - BENIGN RIGHT AND LEFT OVARIES; NEGATIVE FOR ATYPIA OR MALIGNANCY. - BENIGN RIGHT AND LEFT FALLOPIAN TUBE; NEGATIVE FOR ATYPIA OR MALIGNANCY. - BENIGN PARATUBAL CYSTS; NEGATIVE FOR ATYPIA OR MALIGNANCY. - SEE TUMOR SYNOPTIC TEMPLATE BELOW. 2. Lymph nodes, regional resection, right pelvic - FOUR LYMPH NODES, NEGATIVE FOR TUMOR (0/4). 3. Lymph nodes, regional resection, left pelvic - FOUR LYMPH NODES, NEGATIVE FOR TUMOR (0/4). Microscopic Comment 1. ONCOLOGY TABLE-UTERUS, CARCINOMA OR CARCINOSARCOMA Specimen: Uterus and bilateral fallopian tubes and ovaries. Procedure: Hysterectomy and bilateral salpingo-oophorectomy. Lymph node sampling performed: Yes. Specimen integrity: Intact. Maximum tumor size: 4.0 cm (tumor involved entire endometrium) Histologic type: Adenocarcinoma, endometrioid type. Grade: 1 Myometrial invasion: 0.5 cm where myometrium is 1.5 cm in thickness Cervical stromal involvement: Absent. Extent  of involvement of other organs: None Lymph - vascular invasion: Absent. Peritoneal washings: N/A Lymph nodes: # examined - 8 ; # positive - 0 Pelvic lymph nodes: N/A involved of  N/A lymph nodes. Para-aortic lymph nodes: N/A involved of N/A lymph nodes. Other (specify involvement and site): N/A TNM code: pT1a, pN0 FIGO Stage (based on pathologic findings, needs clinical correlation): N/A Comments: None MSI testing abnormal      10/26/2014 Surgery    Surgery: Total robotic hysterectomy, bilateral salpingo-oophorectomy, bilateral pelvic lymph node dissection.   Surgeons:  Lucita Lora. Alycia Rossetti, MD; Lahoma Crocker, MD   Pathology: Uterus, cervix, bilateral tubes and ovaries, bilateral pelvic lymph nodes to pathology.   Operative findings: Omentum adherent to midline vertical incision. Fibroid uterus. Normal adnexa. Adhesions of anterior bladder to uterus. Frozen section with grade 1 cancer focally involving adenomyosis, minimal myometrial invasion.      11/17/2014 Imaging    Ct abdomen 1. Status post hysterectomy. Pelvic edema which is greater than typically seen 3 weeks postop. Suspicious for postoperative infection. 2. Bladder wall thickening and irregularity. Although this could be partially due to underdistention, Concurrent cystitis cannot be excluded. 3. Bilateral fluid density lesions along the pelvic sidewalls. Favored to represent seromas or lymphangiomas. No specific features to suggest abscess. If the patient's symptoms persist after appropriate antibiotic therapy, aspiration of the largest left pelvic sidewall "Lesion" should be considered. 4. Suspicion of mild hepatic steatosis. Indeterminate right hepatic lobe lesion. If definitive characterization is desired in this patient with history of primary malignancy, nonemergent pre and post contrast abdominal MRI could be performed. 5. Hiatal hernia.      11/25/2014 Imaging    1. Persistent bilateral pelvic sidewall fluid  collections, left greater than right, with left-sided inflammatory changes. 2. Technically successful 12 French left pelvic abscess drain catheter placement. A sample of the aspirate was sent for Gram stain, culture and sensitivity.      12/08/2014 Imaging    CT abdomen and pelvis 1. Complete resolution of dominant fluid collection within the left hemipelvis following percutaneous drainage catheter placement. This percutaneous drainage catheter was subsequent removed intact at the patient's bedside. 2. Continued decrease in size of bilobed fluid collections within the right hemipelvis with superficial component measuring 1.6 cm and posterior component measuring 2.6 cm, indeterminate though both favored to represent evolving seromas. 3. Unchanged indeterminate approximately 1.2 cm hepatic lesion. Further evaluation with contrast-enhanced abdominal MRI could be performed as clinically indicated. 4. Colonic diverticulosis without evidence of diverticulitis.      10/19/2016 Imaging    New 2.6 cm soft tissue mass involving right vaginal cuff, consistent with locally recurrent carcinoma.  No other sites of metastatic disease identified.  Colonic diverticulosis, without radiographic evidence of diverticulitis.  Mild hepatic steatosis.       10/24/2016 Relapse/Recurrence    Vaginal cuff recurrence. No evidence of distant disease.      10/24/2016 Pathology Results    Vagina, biopsy, right apex - ADENOCARCINOMA - SEE COMMENT Microscopic Comment The morphologic features are consistent with the patient's previously diagnosed endometrioid adenocarcinoma      11/08/2016 - 12/27/2016 Radiation Therapy    45 Gy in 25 fractions of 1.8 Gy. The residual pelvic mass was boosted to 14 Gy in 7 fractions of 2 Gy. No intracavitary lesion seen at completion of pelvic fields to boost with brachytherapy.      06/12/2017 Imaging    CT abdomen and pelvis 1. New 1.1 by 0.8 cm right lower lobe pulmonary  nodule, concerning for pulmonary metastatic disease. Possibilities for further assessment include biopsy or nuclear medicine PET-CT to assess this lesion and the rest of the neck/chest/abdomen/pelvis for other potential hypermetabolic lesions. 2.  The previous soft tissue mass along the right vaginal cuff is no longer present. 3. Stable and likely benign right hepatic lobe hypodense lesion. 4. Other imaging findings of potential clinical significance: Sigmoid colon diverticulosis. Aortic Atherosclerosis (ICD10-I70.0). Multilevel lumbar degenerative disc disease. Mild cardiomegaly. Small type 1 hiatal hernia.      07/05/2017 Imaging    Ct chest Several bilateral pulmonary nodules measuring up to 11 mm, highly suspicious for pulmonary metastases.   No evidence of lymphadenopathy or pleural effusion.  Aortic Atherosclerosis (ICD10-I70.0).      07/25/2017 Imaging    Status post CT-guided lung nodule biopsy. Tissue specimen sent to pathology for complete histopathologic analysis.      07/25/2017 Pathology Results    Lung, needle/core biopsy(ies), RLL - METASTATIC ADENOCARCINOMA - SEE COMMENT Microscopic Comment By immunohistochemistry, the neoplastic cells are positive for Pax-8 and ER but negative for TTF-1. Overall, this immunoprofile is consistent with metastasis from the patient's known endometrioid adenocarcinoma      07/30/2017 Cancer Staging    Staging form: Corpus Uteri - Carcinoma and Carcinosarcoma, AJCC 8th Edition - Clinical: Stage IVB (rcT1, cN0, pM1) - Signed by Heath Lark, MD on 07/30/2017      08/09/2017 -  Chemotherapy    The patient had pembrolizumab for chemotherapy treatment.       10/10/2017 Imaging    1. Interval decrease in size and contour. The of previously described right and left lower lobe pulmonary nodules. 2. Interval increase in size of mediastinal and hilar adenopathy, potentially metastatic in etiology. 3. Aortic Atherosclerosis (ICD10-I70.0).        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        Metastasis to lung ([place])   07/30/2017 Initial Diagnosis    Metastasis to lung ([place])      08/02/2017 -  Chemotherapy    The patient had pembrolizumab (KEYTRUDA) 200 mg in sodium chloride 0.9 % 50 mL chemo infusion, 200 mg, Intravenous, Once, 4 of 7 cycles Administration: 200 mg (08/09/2017), 200 mg (08/30/2017), 200 mg (09/20/2017), 200 mg (10/11/2017)  for chemotherapy treatment.        REVIEW OF SYSTEMS:   Constitutional: Denies fevers, chills or abnormal weight loss Eyes: Denies blurriness of vision Ears, nose, mouth, throat, and face: Denies mucositis or sore throat Respiratory: Denies cough, dyspnea or wheezes Lymphatics: Denies new lymphadenopathy or easy bruising Neurological:Denies numbness, tingling or new weaknesses Behavioral/Psych: Mood is stable, no new changes  All other systems were reviewed with the patient and are negative.  I have reviewed the past medical history, past surgical history, social history and family history with the patient and they are unchanged from previous note.  ALLERGIES:  is allergic to hydrocodone.  MEDICATIONS:  Current Outpatient Medications  Medication Sig Dispense Refill  . amoxicillin-clavulanate (AUGMENTIN) 875-125 MG tablet Take 1 tablet by mouth 2 (two) times daily. 14 tablet 0  . Calcium Carb-Cholecalciferol (CALCIUM 600 + D PO) Take 1 tablet by mouth daily.    Marland Kitchen FLUoxetine (PROZAC) 40 MG capsule Take 40 mg by mouth daily.    . furosemide (LASIX) 40 MG tablet Take 40 mg by  mouth daily as needed for fluid.     Marland Kitchen levothyroxine  (SYNTHROID, LEVOTHROID) 88 MCG tablet Take 88 mcg by mouth daily before breakfast. Patient takes sat and sun    . naproxen sodium (ALEVE) 220 MG tablet Take 220 mg by mouth daily as needed (pain).     No current facility-administered medications for this visit.     PHYSICAL EXAMINATION: ECOG PERFORMANCE STATUS: 1 - Symptomatic but completely ambulatory  Vitals:   10/11/17 1217  BP: (!) 123/59  Pulse: 74  Resp: 18  Temp: 98 F (36.7 C)  SpO2: 99%   Filed Weights   10/11/17 1217  Weight: 168 lb 4.8 oz (76.3 kg)    GENERAL:alert, no distress and comfortable SKIN: skin color, texture, turgor are normal, no rashes or significant lesions EYES: normal, Conjunctiva are pink and non-injected, sclera clear OROPHARYNX:no exudate, no erythema and lips, buccal mucosa, and tongue normal  NECK: supple, thyroid normal size, non-tender, without nodularity LYMPH:  no palpable lymphadenopathy in the cervical, axillary or inguinal LUNGS: clear to auscultation and percussion with normal breathing effort HEART: regular rate & rhythm and no murmurs and no lower extremity edema ABDOMEN:abdomen soft, non-tender and normal bowel sounds Musculoskeletal:no cyanosis of digits and no clubbing  NEURO: alert & oriented x 3 with fluent speech, no focal motor/sensory deficits  LABORATORY DATA:  I have reviewed the data as listed    Component Value Date/Time   NA 140 10/11/2017 1200   K 3.5 10/11/2017 1200   CL 108 10/11/2017 1200   CO2 24 10/11/2017 1200   GLUCOSE 85 10/11/2017 1200   BUN 12 10/11/2017 1200   BUN 18.7 10/17/2016 1353   CREATININE 0.83 10/11/2017 1200   CREATININE 0.8 10/17/2016 1353   CALCIUM 9.5 10/11/2017 1200   PROT 6.7 10/11/2017 1200   ALBUMIN 3.8 10/11/2017 1200   AST 13 (L) 10/11/2017 1200   ALT 17 10/11/2017 1200   ALKPHOS 63 10/11/2017 1200   BILITOT 0.7 10/11/2017 1200   GFRNONAA >60 10/11/2017 1200   GFRAA >60 10/11/2017 1200    No results found for: SPEP,  UPEP  Lab Results  Component Value Date   WBC 3.9 10/11/2017   NEUTROABS 2.5 10/11/2017   HGB 12.8 10/11/2017   HCT 38.4 10/11/2017   MCV 90.1 10/11/2017   PLT 245 10/11/2017      Chemistry      Component Value Date/Time   NA 140 10/11/2017 1200   K 3.5 10/11/2017 1200   CL 108 10/11/2017 1200   CO2 24 10/11/2017 1200   BUN 12 10/11/2017 1200   BUN 18.7 10/17/2016 1353   CREATININE 0.83 10/11/2017 1200   CREATININE 0.8 10/17/2016 1353      Component Value Date/Time   CALCIUM 9.5 10/11/2017 1200   ALKPHOS 63 10/11/2017 1200   AST 13 (L) 10/11/2017 1200   ALT 17 10/11/2017 1200   BILITOT 0.7 10/11/2017 1200       RADIOGRAPHIC STUDIES: I have reviewed multiple imaging study with the patient and family I have personally reviewed the radiological images as listed and agreed with the findings in the report. Ct Chest W Contrast  Result Date: 10/10/2017 CLINICAL DATA:  Patient with history of endometrial carcinoma diagnosed in 2016 with metastatic disease. Follow-up exam. EXAM: CT CHEST WITH CONTRAST TECHNIQUE: Multidetector CT imaging of the chest was performed during intravenous contrast administration. CONTRAST:  76m OMNIPAQUE IOHEXOL 300 MG/ML  SOLN COMPARISON:  Chest CT 07/05/2017. FINDINGS: Cardiovascular: Heart is mildly  enlarged. Trace fluid superior pericardial recess. Thoracic aortic vascular calcifications. Mediastinum/Nodes: Interval enlargement of multiple mediastinal and hilar lymph nodes. Right paratracheal lymph node measures 9 mm (image 35; series 2), previously 2 mm. Posterior paratracheal lymph node measures 7 mm (image 21; series 2), previously 4 mm. Right paratracheal lymph node measures 6 mm (image 44; series 2), previously 4 mm. Right hilar lymph node measures 12 mm (image 58; series 2), previously 5 mm. Small hiatal hernia. Normal appearance of the esophagus. No thickened or enlarged axillary adenopathy. Lungs/Pleura: Central airways are patent. Dependent  atelectasis/scarring within the bilateral lower lobes. Previously described pulmonary nodules within the right and left lower lobes are decreased in conspicuity and size when compared to prior exam. Reference left lower lobe nodule measures 8 x 7 mm (image 72; series 7), previously 8 x 7 mm however decreased in overall conspicuity. Reference right lower lobe nodule measures 7 x 9 mm (image 92; series 7), previously 12 x 10 mm. Unchanged 3 mm right upper lobe nodule (image 35; series 7). No pleural effusion or pneumothorax. Upper Abdomen: Unchanged cyst right hepatic lobe.  No acute process. Musculoskeletal: Thoracic spine degenerative changes. No aggressive or acute appearing osseous lesions. IMPRESSION: 1. Interval decrease in size and contour. The of previously described right and left lower lobe pulmonary nodules. 2. Interval increase in size of mediastinal and hilar adenopathy, potentially metastatic in etiology. 3. Aortic Atherosclerosis (ICD10-I70.0). Electronically Signed   By: Lovey Newcomer M.D.   On: 10/10/2017 09:49    All questions were answered. The patient knows to call the clinic with any problems, questions or concerns. No barriers to learning was detected.  I spent 25 minutes counseling the patient face to face. The total time spent in the appointment was 40 minutes and more than 50% was on counseling and review of test results  Heath Lark, MD 10/11/2017 5:40 PM

## 2017-10-11 NOTE — Assessment & Plan Note (Signed)
I have reviewed CT imaging with the patient and family members Even though there are evidence of mild progression lymphadenopathy in the mediastinum, she had symptoms suspicious for infection recently The lung nodules are improving I recommend we proceed with treatment without delay I plan to repeat imaging study again after 3 more cycles of treatment

## 2017-10-11 NOTE — Assessment & Plan Note (Signed)
She is not symptomatic The lung nodules are improving We will continue treatment without dose adjustment

## 2017-10-11 NOTE — Telephone Encounter (Signed)
-----   Message from Heath Lark, MD sent at 10/11/2017  1:25 PM EDT ----- Regarding: TSH pls let her know TSH is OK Please also forward result to Dr. Chalmers Cater, endocrinologist ----- Message ----- From: Interface, Lab In [place] Sent: 10/11/2017  12:19 PM To: Heath Lark, MD

## 2017-10-14 ENCOUNTER — Telehealth: Payer: Self-pay | Admitting: *Deleted

## 2017-10-14 NOTE — Telephone Encounter (Signed)
Labs faxed to Dr Balan  

## 2017-10-22 ENCOUNTER — Telehealth: Payer: Self-pay | Admitting: *Deleted

## 2017-10-22 NOTE — Telephone Encounter (Signed)
Pt states she is having a constant pain in right side- "woke me up at 3:00am". Had diarrhea Saturday night- all night long, took imodium Sunday morning. Has not had a BM since Sunday AM. States "imodium always backs me up". Is currently eating chocolate, nuts and raisins to make her bowels move. RN asked if she had any stool softeners at home- states dulcolax makes her have diarrhea.

## 2017-10-22 NOTE — Telephone Encounter (Signed)
Encouraged to use Tylenol or ibuprofen for pain.

## 2017-10-22 NOTE — Telephone Encounter (Signed)
Her pain is likely due to her GI issues Has she tried tylenol or ibuprofen at all?

## 2017-11-01 ENCOUNTER — Encounter: Payer: Self-pay | Admitting: Hematology and Oncology

## 2017-11-01 ENCOUNTER — Inpatient Hospital Stay: Payer: 59 | Attending: Gynecologic Oncology

## 2017-11-01 ENCOUNTER — Inpatient Hospital Stay (HOSPITAL_BASED_OUTPATIENT_CLINIC_OR_DEPARTMENT_OTHER): Payer: 59 | Admitting: Hematology and Oncology

## 2017-11-01 ENCOUNTER — Inpatient Hospital Stay: Payer: 59

## 2017-11-01 DIAGNOSIS — R21 Rash and other nonspecific skin eruption: Secondary | ICD-10-CM

## 2017-11-01 DIAGNOSIS — R590 Localized enlarged lymph nodes: Secondary | ICD-10-CM | POA: Insufficient documentation

## 2017-11-01 DIAGNOSIS — R52 Pain, unspecified: Secondary | ICD-10-CM | POA: Insufficient documentation

## 2017-11-01 DIAGNOSIS — C541 Malignant neoplasm of endometrium: Secondary | ICD-10-CM

## 2017-11-01 DIAGNOSIS — C7800 Secondary malignant neoplasm of unspecified lung: Secondary | ICD-10-CM

## 2017-11-01 DIAGNOSIS — Z5112 Encounter for antineoplastic immunotherapy: Secondary | ICD-10-CM | POA: Insufficient documentation

## 2017-11-01 DIAGNOSIS — E039 Hypothyroidism, unspecified: Secondary | ICD-10-CM

## 2017-11-01 DIAGNOSIS — E038 Other specified hypothyroidism: Secondary | ICD-10-CM

## 2017-11-01 LAB — CBC WITH DIFFERENTIAL (CANCER CENTER ONLY)
Basophils Absolute: 0.1 10*3/uL (ref 0.0–0.1)
Basophils Relative: 1 %
Eosinophils Absolute: 0.2 10*3/uL (ref 0.0–0.5)
Eosinophils Relative: 5 %
HEMATOCRIT: 39.2 % (ref 34.8–46.6)
Hemoglobin: 13.3 g/dL (ref 11.6–15.9)
LYMPHS ABS: 0.5 10*3/uL — AB (ref 0.9–3.3)
LYMPHS PCT: 14 %
MCH: 30.1 pg (ref 25.1–34.0)
MCHC: 33.9 g/dL (ref 31.5–36.0)
MCV: 89 fL (ref 79.5–101.0)
Monocytes Absolute: 0.5 10*3/uL (ref 0.1–0.9)
Monocytes Relative: 13 %
NEUTROS ABS: 2.6 10*3/uL (ref 1.5–6.5)
Neutrophils Relative %: 67 %
PLATELETS: 212 10*3/uL (ref 145–400)
RBC: 4.41 MIL/uL (ref 3.70–5.45)
RDW: 14.5 % (ref 11.2–14.5)
WBC Count: 3.8 10*3/uL — ABNORMAL LOW (ref 3.9–10.3)

## 2017-11-01 LAB — CMP (CANCER CENTER ONLY)
ALK PHOS: 54 U/L (ref 38–126)
ALT: 17 U/L (ref 0–44)
AST: 14 U/L — ABNORMAL LOW (ref 15–41)
Albumin: 3.7 g/dL (ref 3.5–5.0)
Anion gap: 11 (ref 5–15)
BILIRUBIN TOTAL: 0.9 mg/dL (ref 0.3–1.2)
BUN: 19 mg/dL (ref 8–23)
CALCIUM: 9 mg/dL (ref 8.9–10.3)
CO2: 22 mmol/L (ref 22–32)
Chloride: 107 mmol/L (ref 98–111)
Creatinine: 0.87 mg/dL (ref 0.44–1.00)
Glucose, Bld: 91 mg/dL (ref 70–99)
Potassium: 4 mmol/L (ref 3.5–5.1)
Sodium: 140 mmol/L (ref 135–145)
TOTAL PROTEIN: 6.7 g/dL (ref 6.5–8.1)

## 2017-11-01 LAB — TSH: TSH: 1.709 u[IU]/mL (ref 0.308–3.960)

## 2017-11-01 MED ORDER — SODIUM CHLORIDE 0.9 % IV SOLN
Freq: Once | INTRAVENOUS | Status: AC
  Administered 2017-11-01: 10:00:00 via INTRAVENOUS
  Filled 2017-11-01: qty 250

## 2017-11-01 MED ORDER — PEMBROLIZUMAB CHEMO INJECTION 100 MG/4ML
200.0000 mg | Freq: Once | INTRAVENOUS | Status: AC
  Administered 2017-11-01: 200 mg via INTRAVENOUS
  Filled 2017-11-01: qty 8

## 2017-11-01 NOTE — Assessment & Plan Note (Signed)
Last Ct imaging showed evidence of mild progression lymphadenopathy in the mediastinum, which I suspect due to infection The lung nodules are improving I recommend we proceed with treatment without delay I plan to repeat imaging study again after 3 more cycles of treatment, due in September

## 2017-11-01 NOTE — Assessment & Plan Note (Signed)
She is not symptomatic The lung nodules are improving We will continue treatment without dose adjustment

## 2017-11-01 NOTE — Progress Notes (Signed)
[place] OFFICE PROGRESS NOTE  Patient Care Team: [name], MD as PCP - General (Internal Medicine) Jacelyn Pi, MD as Consulting Physician (Endocrinology)  ASSESSMENT & PLAN:  Endometrial cancer [facility]) Last Ct imaging showed evidence of mild progression lymphadenopathy in the mediastinum, which I suspect due to infection The lung nodules are improving I recommend we proceed with treatment without delay I plan to repeat imaging study again after 3 more cycles of treatment, due in September  Metastasis to lung [facility]) She is not symptomatic The lung nodules are improving We will continue treatment without dose adjustment  Skin rash She has minor skin rash It is unlikely caused by immunotherapy I recommend conservative management with topical steroid cream and Benadryl as needed  Hypothyroidism She has mild fatigue could be related to hypothyroidism She had recent medication changes Recommend lifestyle modification and exercise as tolerated   No orders of the defined types were placed in this encounter.   INTERVAL HISTORY: Please see below for problem oriented charting. She returns for further follow-up She had numerous questions She is asking whether she can take probiotic.  The answer is yes. She complained of mild dry mouth.  Could be related to dehydration.  I advised more liquid water She complained of mild intermittent skin rashes on her back that is itchy She complained of worsening fatigue Denies cough, chest pain or shortness of breath No recent infection or fevers Denies diarrhea She complained of bilateral wrist pain but she types a lot.  Could be due to overuse fatigue  SUMMARY OF ONCOLOGIC HISTORY: Oncology History   Abnormal MSI, germline mutation was negative Endometrioid     Endometrial cancer ([place])   09/15/2014 Pathology Results    Endometrium, curettage - ENDOMETRIAL ADENOCARCINOMA. - SEE COMMENT. Microscopic  Comment Although definitive characterization is best performed on resection specimen, as sampled, the endometrial adenocarcinoma appears to be endometrioid subtype, FIGO grade 1.     09/15/2014 Surgery    PreOp: postmenopausal bleeding, cervical stenosis PostOp: same and uterine polyp Procedure:  Hysteroscopy, Dilation and Curettage, Myosure polypectomy Surgeon: Dr. Janyth Pupa  Findings:8cm uterus with thickened polypoid-like endometrium, large 2cm irregular appearing polyp  Specimens: 1) endometrial curettings    10/26/2014 Pathology Results    1. Uterus +/- tubes/ovaries, neoplastic - INVASIVE ADENOCARCINOMA, ENDOMETRIOID TYPE, SEE COMMENT. - TUMOR INVOLVES LESS THAN ONE HALF MYOMETRIAL THICKNESS. - TUMOR INVOLVES UTERINE ADENOMYOSIS. - BENIGN LEIOMYOMATA (UP TO 2.5 CM). - BENIGN CERVICAL MUCOSA; NEGATIVE FOR INTRAEPITHELIAL LESION OR MALIGNANCY. - BENIGN RIGHT AND LEFT OVARIES; NEGATIVE FOR ATYPIA OR MALIGNANCY. - BENIGN RIGHT AND LEFT FALLOPIAN TUBE; NEGATIVE FOR ATYPIA OR MALIGNANCY. - BENIGN PARATUBAL CYSTS; NEGATIVE FOR ATYPIA OR MALIGNANCY. - SEE TUMOR SYNOPTIC TEMPLATE BELOW. 2. Lymph nodes, regional resection, right pelvic - FOUR LYMPH NODES, NEGATIVE FOR TUMOR (0/4). 3. Lymph nodes, regional resection, left pelvic - FOUR LYMPH NODES, NEGATIVE FOR TUMOR (0/4). Microscopic Comment 1. ONCOLOGY TABLE-UTERUS, CARCINOMA OR CARCINOSARCOMA Specimen: Uterus and bilateral fallopian tubes and ovaries. Procedure: Hysterectomy and bilateral salpingo-oophorectomy. Lymph node sampling performed: Yes. Specimen integrity: Intact. Maximum tumor size: 4.0 cm (tumor involved entire endometrium) Histologic type: Adenocarcinoma, endometrioid type. Grade: 1 Myometrial invasion: 0.5 cm where myometrium is 1.5 cm in thickness Cervical stromal involvement: Absent. Extent of involvement of other organs: None Lymph - vascular invasion: Absent. Peritoneal washings: N/A Lymph nodes: #  examined - 8 ; # positive - 0 Pelvic lymph nodes: N/A involved of N/A lymph nodes. Para-aortic lymph nodes: N/A involved  of N/A lymph nodes. Other (specify involvement and site): N/A TNM code: pT1a, pN0 FIGO Stage (based on pathologic findings, needs clinical correlation): N/A Comments: None MSI testing abnormal    10/26/2014 Surgery    Surgery: Total robotic hysterectomy, bilateral salpingo-oophorectomy, bilateral pelvic lymph node dissection.   Surgeons:  Lucita Lora. Alycia Rossetti, MD; Lahoma Crocker, MD   Pathology: Uterus, cervix, bilateral tubes and ovaries, bilateral pelvic lymph nodes to pathology.   Operative findings: Omentum adherent to midline vertical incision. Fibroid uterus. Normal adnexa. Adhesions of anterior bladder to uterus. Frozen section with grade 1 cancer focally involving adenomyosis, minimal myometrial invasion.    11/17/2014 Imaging    Ct abdomen 1. Status post hysterectomy. Pelvic edema which is greater than typically seen 3 weeks postop. Suspicious for postoperative infection. 2. Bladder wall thickening and irregularity. Although this could be partially due to underdistention, Concurrent cystitis cannot be excluded. 3. Bilateral fluid density lesions along the pelvic sidewalls. Favored to represent seromas or lymphangiomas. No specific features to suggest abscess. If the patient's symptoms persist after appropriate antibiotic therapy, aspiration of the largest left pelvic sidewall "Lesion" should be considered. 4. Suspicion of mild hepatic steatosis. Indeterminate right hepatic lobe lesion. If definitive characterization is desired in this patient with history of primary malignancy, nonemergent pre and post contrast abdominal MRI could be performed. 5. Hiatal hernia.    11/25/2014 Imaging    1. Persistent bilateral pelvic sidewall fluid collections, left greater than right, with left-sided inflammatory changes. 2. Technically successful 12 French left pelvic abscess  drain catheter placement. A sample of the aspirate was sent for Gram stain, culture and sensitivity.    12/08/2014 Imaging    CT abdomen and pelvis 1. Complete resolution of dominant fluid collection within the left hemipelvis following percutaneous drainage catheter placement. This percutaneous drainage catheter was subsequent removed intact at the patient's bedside. 2. Continued decrease in size of bilobed fluid collections within the right hemipelvis with superficial component measuring 1.6 cm and posterior component measuring 2.6 cm, indeterminate though both favored to represent evolving seromas. 3. Unchanged indeterminate approximately 1.2 cm hepatic lesion. Further evaluation with contrast-enhanced abdominal MRI could be performed as clinically indicated. 4. Colonic diverticulosis without evidence of diverticulitis.    10/19/2016 Imaging    New 2.6 cm soft tissue mass involving right vaginal cuff, consistent with locally recurrent carcinoma.  No other sites of metastatic disease identified.  Colonic diverticulosis, without radiographic evidence of diverticulitis.  Mild hepatic steatosis.     10/24/2016 Relapse/Recurrence    Vaginal cuff recurrence. No evidence of distant disease.    10/24/2016 Pathology Results    Vagina, biopsy, right apex - ADENOCARCINOMA - SEE COMMENT Microscopic Comment The morphologic features are consistent with the patient's previously diagnosed endometrioid adenocarcinoma    11/08/2016 - 12/27/2016 Radiation Therapy    45 Gy in 25 fractions of 1.8 Gy. The residual pelvic mass was boosted to 14 Gy in 7 fractions of 2 Gy. No intracavitary lesion seen at completion of pelvic fields to boost with brachytherapy.    06/12/2017 Imaging    CT abdomen and pelvis 1. New 1.1 by 0.8 cm right lower lobe pulmonary nodule, concerning for pulmonary metastatic disease. Possibilities for further assessment include biopsy or nuclear medicine PET-CT to assess this lesion and  the rest of the neck/chest/abdomen/pelvis for other potential hypermetabolic lesions. 2. The previous soft tissue mass along the right vaginal cuff is no longer present. 3. Stable and likely benign right hepatic lobe hypodense lesion. 4. Other  imaging findings of potential clinical significance: Sigmoid colon diverticulosis. Aortic Atherosclerosis (ICD10-I70.0). Multilevel lumbar degenerative disc disease. Mild cardiomegaly. Small type 1 hiatal hernia.    07/05/2017 Imaging    Ct chest Several bilateral pulmonary nodules measuring up to 11 mm, highly suspicious for pulmonary metastases.   No evidence of lymphadenopathy or pleural effusion.  Aortic Atherosclerosis (ICD10-I70.0).    07/25/2017 Imaging    Status post CT-guided lung nodule biopsy. Tissue specimen sent to pathology for complete histopathologic analysis.    07/25/2017 Pathology Results    Lung, needle/core biopsy(ies), RLL - METASTATIC ADENOCARCINOMA - SEE COMMENT Microscopic Comment By immunohistochemistry, the neoplastic cells are positive for Pax-8 and ER but negative for TTF-1. Overall, this immunoprofile is consistent with metastasis from the patient's known endometrioid adenocarcinoma    07/30/2017 Cancer Staging    Staging form: Corpus Uteri - Carcinoma and Carcinosarcoma, AJCC 8th Edition - Clinical: Stage IVB (rcT1, cN0, pM1) - Signed by Heath Lark, MD on 07/30/2017    08/09/2017 -  Chemotherapy    The patient had pembrolizumab for chemotherapy treatment.     10/10/2017 Imaging    1. Interval decrease in size and contour. The of previously described right and left lower lobe pulmonary nodules. 2. Interval increase in size of mediastinal and hilar adenopathy, potentially metastatic in etiology. 3. Aortic Atherosclerosis (ICD10-I70.0).     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      Metastasis to lung ([place])   07/30/2017 Initial Diagnosis    Metastasis to lung ([place])    08/02/2017 -  Chemotherapy    The patient had pembrolizumab (KEYTRUDA) 200 mg in sodium chloride 0.9 % 50 mL chemo infusion, 200 mg, Intravenous, Once, 5 of 7 cycles Administration: 200 mg (08/09/2017), 200 mg (08/30/2017), 200 mg (09/20/2017), 200 mg (10/11/2017), 200 mg (11/01/2017)  for chemotherapy treatment.      REVIEW OF SYSTEMS:   Constitutional: Denies fevers, chills or abnormal weight loss Eyes: Denies blurriness of vision Ears, nose, mouth, throat, and face: Denies mucositis or sore throat Respiratory: Denies cough, dyspnea or wheezes Cardiovascular: Denies palpitation, chest discomfort or lower extremity swelling Gastrointestinal:  Denies nausea, heartburn or change in bowel habits Lymphatics: Denies new lymphadenopathy or easy bruising Neurological:Denies numbness, tingling or new weaknesses Behavioral/Psych: Mood is stable, no new changes  All other systems were reviewed with the patient and are negative.  I have reviewed the past medical history, past surgical history, social history and family history with the patient and they are unchanged from previous note.  ALLERGIES:  is allergic to hydrocodone.  MEDICATIONS:  Current Outpatient Medications  Medication Sig Dispense Refill  . Calcium Carb-Cholecalciferol (CALCIUM 600 + D PO) Take 1 tablet by mouth daily.    Marland Kitchen FLUoxetine (PROZAC) 40 MG capsule Take 40 mg by mouth daily.    . furosemide (LASIX) 40 MG tablet Take 40 mg by mouth daily as needed for fluid.     Marland Kitchen levothyroxine (SYNTHROID, LEVOTHROID) 88 MCG tablet Take 100 mcg by mouth daily before breakfast.    . naproxen sodium (ALEVE)  220 MG tablet Take 220 mg by mouth daily  as needed (pain).     No current facility-administered medications for this visit.     PHYSICAL EXAMINATION: ECOG PERFORMANCE STATUS: 1 - Symptomatic but completely ambulatory  Vitals:   11/01/17 0855  BP: (!) 144/77  Pulse: 67  Resp: 18  Temp: 98.6 F (37 C)  SpO2: 98%   Filed Weights   11/01/17 0855  Weight: 167 lb 6.4 oz (75.9 kg)    GENERAL:alert, no distress and comfortable SKIN: skin color, texture, turgor are normal, no rashes or significant lesions.  Minimum skin rash which is not classic for drug-induced skin reaction EYES: normal, Conjunctiva are pink and non-injected, sclera clear OROPHARYNX:no exudate, no erythema and lips, buccal mucosa, and tongue normal  NECK: supple, thyroid normal size, non-tender, without nodularity LYMPH:  no palpable lymphadenopathy in the cervical, axillary or inguinal LUNGS: clear to auscultation and percussion with normal breathing effort HEART: regular rate & rhythm and no murmurs and no lower extremity edema ABDOMEN:abdomen soft, non-tender and normal bowel sounds Musculoskeletal:no cyanosis of digits and no clubbing  NEURO: alert & oriented x 3 with fluent speech, no focal motor/sensory deficits  LABORATORY DATA:  I have reviewed the data as listed    Component Value Date/Time   NA 140 11/01/2017 0825   K 4.0 11/01/2017 0825   CL 107 11/01/2017 0825   CO2 22 11/01/2017 0825   GLUCOSE 91 11/01/2017 0825   BUN 19 11/01/2017 0825   BUN 18.7 10/17/2016 1353   CREATININE 0.87 11/01/2017 0825   CREATININE 0.8 10/17/2016 1353   CALCIUM 9.0 11/01/2017 0825   PROT 6.7 11/01/2017 0825   ALBUMIN 3.7 11/01/2017 0825   AST 14 (L) 11/01/2017 0825   ALT 17 11/01/2017 0825   ALKPHOS 54 11/01/2017 0825   BILITOT 0.9 11/01/2017 0825   GFRNONAA >60 11/01/2017 0825   GFRAA >60 11/01/2017 0825    No results found for: SPEP, UPEP  Lab Results  Component Value Date   WBC 3.8 (L) 11/01/2017    NEUTROABS 2.6 11/01/2017   HGB 13.3 11/01/2017   HCT 39.2 11/01/2017   MCV 89.0 11/01/2017   PLT 212 11/01/2017      Chemistry      Component Value Date/Time   NA 140 11/01/2017 0825   K 4.0 11/01/2017 0825   CL 107 11/01/2017 0825   CO2 22 11/01/2017 0825   BUN 19 11/01/2017 0825   BUN 18.7 10/17/2016 1353   CREATININE 0.87 11/01/2017 0825   CREATININE 0.8 10/17/2016 1353      Component Value Date/Time   CALCIUM 9.0 11/01/2017 0825   ALKPHOS 54 11/01/2017 0825   AST 14 (L) 11/01/2017 0825   ALT 17 11/01/2017 0825   BILITOT 0.9 11/01/2017 0825       RADIOGRAPHIC STUDIES: I have personally reviewed the radiological images as listed and agreed with the findings in the report. Ct Chest W Contrast  Result Date: 10/10/2017 CLINICAL DATA:  Patient with history of endometrial carcinoma diagnosed in 2016 with metastatic disease. Follow-up exam. EXAM: CT CHEST WITH CONTRAST TECHNIQUE: Multidetector CT imaging of the chest was performed during intravenous contrast administration. CONTRAST:  43m OMNIPAQUE IOHEXOL 300 MG/ML  SOLN COMPARISON:  Chest CT 07/05/2017. FINDINGS: Cardiovascular: Heart is mildly enlarged. Trace fluid superior pericardial recess. Thoracic aortic vascular calcifications. Mediastinum/Nodes: Interval enlargement of multiple mediastinal and hilar lymph nodes. Right paratracheal lymph node measures 9 mm (image 35; series 2), previously 2 mm. Posterior paratracheal lymph node measures 7 mm (image 21; series 2),  previously 4 mm. Right paratracheal lymph node measures 6 mm (image 44; series 2), previously 4 mm. Right hilar lymph node measures 12 mm (image 58; series 2), previously 5 mm. Small hiatal hernia. Normal appearance of the esophagus. No thickened or enlarged axillary adenopathy. Lungs/Pleura: Central airways are patent. Dependent atelectasis/scarring within the bilateral lower lobes. Previously described pulmonary nodules within the right and left lower lobes are  decreased in conspicuity and size when compared to prior exam. Reference left lower lobe nodule measures 8 x 7 mm (image 72; series 7), previously 8 x 7 mm however decreased in overall conspicuity. Reference right lower lobe nodule measures 7 x 9 mm (image 92; series 7), previously 12 x 10 mm. Unchanged 3 mm right upper lobe nodule (image 35; series 7). No pleural effusion or pneumothorax. Upper Abdomen: Unchanged cyst right hepatic lobe.  No acute process. Musculoskeletal: Thoracic spine degenerative changes. No aggressive or acute appearing osseous lesions. IMPRESSION: 1. Interval decrease in size and contour. The of previously described right and left lower lobe pulmonary nodules. 2. Interval increase in size of mediastinal and hilar adenopathy, potentially metastatic in etiology. 3. Aortic Atherosclerosis (ICD10-I70.0). Electronically Signed   By: Lovey Newcomer M.D.   On: 10/10/2017 09:49    All questions were answered. The patient knows to call the clinic with any problems, questions or concerns. No barriers to learning was detected.  I spent 15 minutes counseling the patient face to face. The total time spent in the appointment was 20 minutes and more than 50% was on counseling and review of test results  Heath Lark, MD 11/01/2017 1:21 PM

## 2017-11-01 NOTE — Assessment & Plan Note (Signed)
She has mild fatigue could be related to hypothyroidism She had recent medication changes Recommend lifestyle modification and exercise as tolerated

## 2017-11-01 NOTE — Patient Instructions (Signed)
Cancer Center Discharge Instructions for Patients Receiving Chemotherapy  Today you received the following chemotherapy agents :  Keytruda.  To help prevent nausea and vomiting after your treatment, we encourage you to take your nausea medication as prescribed.   If you develop nausea and vomiting that is not controlled by your nausea medication, call the clinic.   BELOW ARE SYMPTOMS THAT SHOULD BE REPORTED IMMEDIATELY:  *FEVER GREATER THAN 100.5 F  *CHILLS WITH OR WITHOUT FEVER  NAUSEA AND VOMITING THAT IS NOT CONTROLLED WITH YOUR NAUSEA MEDICATION  *UNUSUAL SHORTNESS OF BREATH  *UNUSUAL BRUISING OR BLEEDING  TENDERNESS IN MOUTH AND THROAT WITH OR WITHOUT PRESENCE OF ULCERS  *URINARY PROBLEMS  *BOWEL PROBLEMS  UNUSUAL RASH Items with * indicate a potential emergency and should be followed up as soon as possible.  Feel free to call the clinic should you have any questions or concerns. The clinic phone number is [phone].  Please show the CHEMO ALERT CARD at check-in to the Emergency Department and triage nurse.  

## 2017-11-01 NOTE — Assessment & Plan Note (Signed)
She has minor skin rash It is unlikely caused by immunotherapy I recommend conservative management with topical steroid cream and Benadryl as needed

## 2017-11-08 ENCOUNTER — Inpatient Hospital Stay (HOSPITAL_COMMUNITY)
Admission: EM | Admit: 2017-11-08 | Discharge: 2017-11-10 | DRG: 554 | Disposition: A | Discharge status: Home / Self Care | Payer: 59 | Attending: Internal Medicine | Admitting: Internal Medicine

## 2017-11-08 ENCOUNTER — Other Ambulatory Visit: Payer: Self-pay

## 2017-11-08 ENCOUNTER — Encounter (HOSPITAL_COMMUNITY): Payer: Self-pay | Admitting: Emergency Medicine

## 2017-11-08 DIAGNOSIS — M069 Rheumatoid arthritis, unspecified: Secondary | ICD-10-CM | POA: Diagnosis present

## 2017-11-08 DIAGNOSIS — Z803 Family history of malignant neoplasm of breast: Secondary | ICD-10-CM

## 2017-11-08 DIAGNOSIS — Z8049 Family history of malignant neoplasm of other genital organs: Secondary | ICD-10-CM

## 2017-11-08 DIAGNOSIS — Z90722 Acquired absence of ovaries, bilateral: Secondary | ICD-10-CM

## 2017-11-08 DIAGNOSIS — M064 Inflammatory polyarthropathy: Principal | ICD-10-CM | POA: Diagnosis present

## 2017-11-08 DIAGNOSIS — C7800 Secondary malignant neoplasm of unspecified lung: Secondary | ICD-10-CM | POA: Diagnosis present

## 2017-11-08 DIAGNOSIS — R52 Pain, unspecified: Secondary | ICD-10-CM | POA: Diagnosis not present

## 2017-11-08 DIAGNOSIS — Z79890 Hormone replacement therapy (postmenopausal): Secondary | ICD-10-CM

## 2017-11-08 DIAGNOSIS — Z5112 Encounter for antineoplastic immunotherapy: Secondary | ICD-10-CM

## 2017-11-08 DIAGNOSIS — C541 Malignant neoplasm of endometrium: Secondary | ICD-10-CM | POA: Diagnosis present

## 2017-11-08 DIAGNOSIS — F329 Major depressive disorder, single episode, unspecified: Secondary | ICD-10-CM | POA: Diagnosis present

## 2017-11-08 DIAGNOSIS — Z87891 Personal history of nicotine dependence: Secondary | ICD-10-CM

## 2017-11-08 DIAGNOSIS — F419 Anxiety disorder, unspecified: Secondary | ICD-10-CM | POA: Diagnosis present

## 2017-11-08 DIAGNOSIS — Z923 Personal history of irradiation: Secondary | ICD-10-CM

## 2017-11-08 DIAGNOSIS — M609 Myositis, unspecified: Secondary | ICD-10-CM | POA: Diagnosis present

## 2017-11-08 DIAGNOSIS — Z8249 Family history of ischemic heart disease and other diseases of the circulatory system: Secondary | ICD-10-CM

## 2017-11-08 DIAGNOSIS — K219 Gastro-esophageal reflux disease without esophagitis: Secondary | ICD-10-CM | POA: Diagnosis present

## 2017-11-08 DIAGNOSIS — E039 Hypothyroidism, unspecified: Secondary | ICD-10-CM | POA: Diagnosis present

## 2017-11-08 DIAGNOSIS — Z79899 Other long term (current) drug therapy: Secondary | ICD-10-CM

## 2017-11-08 DIAGNOSIS — T887XXA Unspecified adverse effect of drug or medicament, initial encounter: Secondary | ICD-10-CM

## 2017-11-08 DIAGNOSIS — Z885 Allergy status to narcotic agent status: Secondary | ICD-10-CM

## 2017-11-08 DIAGNOSIS — Z791 Long term (current) use of non-steroidal anti-inflammatories (NSAID): Secondary | ICD-10-CM

## 2017-11-08 DIAGNOSIS — Z90710 Acquired absence of both cervix and uterus: Secondary | ICD-10-CM

## 2017-11-08 LAB — COMPREHENSIVE METABOLIC PANEL
ALT: 19 U/L (ref 0–44)
ANION GAP: 11 (ref 5–15)
AST: 18 U/L (ref 15–41)
Albumin: 3.9 g/dL (ref 3.5–5.0)
Alkaline Phosphatase: 44 U/L (ref 38–126)
BUN: 15 mg/dL (ref 8–23)
CHLORIDE: 103 mmol/L (ref 98–111)
CO2: 24 mmol/L (ref 22–32)
Calcium: 9.3 mg/dL (ref 8.9–10.3)
Creatinine, Ser: 0.79 mg/dL (ref 0.44–1.00)
GFR calc Af Amer: >60 mL/min (ref >60)
Glucose, Bld: 86 mg/dL (ref 70–99)
POTASSIUM: 3.8 mmol/L (ref 3.5–5.1)
Sodium: 138 mmol/L (ref 135–145)
Total Bilirubin: 0.9 mg/dL (ref 0.3–1.2)
Total Protein: 6.8 g/dL (ref 6.5–8.1)

## 2017-11-08 LAB — CBC
HCT: 39.5 % (ref 36.0–46.0)
Hemoglobin: 13.5 g/dL (ref 12.0–15.0)
MCH: 30.5 pg (ref 26.0–34.0)
MCHC: 34.2 g/dL (ref 30.0–36.0)
MCV: 89.4 fL (ref 78.0–100.0)
PLATELETS: 250 10*3/uL (ref 150–400)
RBC: 4.42 MIL/uL (ref 3.87–5.11)
RDW: 13.7 % (ref 11.5–15.5)
WBC: 9.6 10*3/uL (ref 4.0–10.5)

## 2017-11-08 LAB — I-STAT TROPONIN, ED: Troponin i, poc: 0 ng/mL (ref 0.00–0.08)

## 2017-11-08 LAB — C-REACTIVE PROTEIN: CRP: <0.8 mg/dL (ref <1.0)

## 2017-11-08 LAB — LACTIC ACID, PLASMA: LACTIC ACID, VENOUS: 1.5 mmol/L (ref 0.5–1.9)

## 2017-11-08 LAB — SEDIMENTATION RATE: SED RATE: 25 mm/h — AB (ref 0–22)

## 2017-11-08 LAB — CK: CK TOTAL: 41 U/L (ref 38–234)

## 2017-11-08 LAB — LIPASE, BLOOD: LIPASE: 107 U/L — AB (ref 11–51)

## 2017-11-08 LAB — TROPONIN I: Troponin I: 0.07 ng/mL (ref <0.03)

## 2017-11-08 MED ORDER — KETOROLAC TROMETHAMINE 15 MG/ML IJ SOLN
15.0000 mg | Freq: Once | INTRAMUSCULAR | Status: AC
  Administered 2017-11-08: 15 mg via INTRAVENOUS
  Filled 2017-11-08: qty 1

## 2017-11-08 MED ORDER — FENTANYL CITRATE (PF) 100 MCG/2ML IJ SOLN
50.0000 ug | INTRAMUSCULAR | Status: AC | PRN
  Administered 2017-11-08 (×3): 50 ug via INTRAVENOUS
  Filled 2017-11-08 (×3): qty 2

## 2017-11-08 MED ORDER — SODIUM CHLORIDE 0.9 % IV BOLUS
1000.0000 mL | Freq: Once | INTRAVENOUS | Status: AC
  Administered 2017-11-08: 1000 mL via INTRAVENOUS

## 2017-11-08 MED ORDER — FENTANYL CITRATE (PF) 100 MCG/2ML IJ SOLN
100.0000 ug | Freq: Once | INTRAMUSCULAR | Status: AC
  Administered 2017-11-08: 100 ug via INTRAVENOUS
  Filled 2017-11-08: qty 2

## 2017-11-08 MED ORDER — ACETAMINOPHEN 325 MG PO TABS
650.0000 mg | ORAL_TABLET | Freq: Once | ORAL | Status: AC
  Administered 2017-11-08: 650 mg via ORAL
  Filled 2017-11-08: qty 2

## 2017-11-08 NOTE — ED Notes (Signed)
EKG given to EDP,Bero,MD., for review. 

## 2017-11-08 NOTE — ED Triage Notes (Deleted)
Patient has hx of colon cancer. Patient is complaining of abdominal pain. Patient states the pain started this morning. Patient does not have any other symptoms.  

## 2017-11-08 NOTE — ED Triage Notes (Signed)
Pt stated "I began hurting about 3:30 this afternoon.  I hurt all over, it's like burning."

## 2017-11-08 NOTE — ED Notes (Signed)
Pt reports that pain medicine is not helping pain at all. Provider notified. Will give PRN meds.

## 2017-11-08 NOTE — ED Provider Notes (Signed)
[facility] Emergency Department Provider Note MRN:  [id number]  Arrival date & time: 11/09/17     Chief Complaint   generalized body pain and cancer patient   History of Present Illness   Ashley Villegas is a 67 y.o. 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  Patient last receive infusion of immunotherapy 1 to 2 weeks ago.  Today at 3:30 PM, began experiencing sudden onset burning, aching pain to her entire body, from head to toe.  Pain is located on the surface of the skin and also deeper.  The pain is severe, constant, worse with motion or palpation.  No alleviating factors.  Patient endorsing 2 months of watery diarrhea, nonbloody.  Denies fever, no headache, no vision change, no specific chest pain or shortness of breath, no abdominal pain.  Has had minor rashes related to immunotherapy side effect but this is never occurred before.  Review of Systems  A complete 10 system review of systems was obtained and all systems are negative except as noted in the HPI and PMH.   Patient's Health History    Past Medical History:  Diagnosis Date  . Anxiety   . Arthritis   . Cancer [facility])    endometrial cancer  . Complication of anesthesia   . Depression   . Family history of uterine cancer   . GERD (gastroesophageal reflux disease)   . History of radiation therapy 11/08/2016-12/27/2016   pelvis 45 Gy in 25 fractions, pelvis boost 14 gy in 7 fractions  . Hypothyroidism    on synthroid   . PONV (postoperative nausea and vomiting)     Past Surgical History:  Procedure Laterality Date  . CESAREAN SECTION    . COLONOSCOPY    . DIAGNOSTIC LAPAROSCOPY     fibroid tumors on her ovaries   . DILATION AND CURETTAGE OF UTERUS    . HYSTEROSCOPY W/D&C N/A 09/15/2014   Procedure: DILATATION AND CURETTAGE /HYSTEROSCOPY/MYOSURE RESECTION OF POLYP;  Surgeon:  Janyth Pupa, DO;  Location: [facility];  Service: Gynecology;  Laterality: N/A;  . KNEE SURGERY    . LYMPH NODE DISSECTION N/A 10/26/2014   Procedure: POSS LYMPHADENECTOMY ;  Surgeon: [name], MD;  Location: [facility];  Service: Gynecology;  Laterality: N/A;  . ROBOTIC ASSISTED TOTAL HYSTERECTOMY WITH BILATERAL SALPINGO OOPHERECTOMY Bilateral 10/26/2014   Procedure: ROBOTIC ASSISTED TOTAL HYSTERECTOMY WITH BILATERAL SALPINGO OOPHORECTOMY;  Surgeon: [name], MD;  Location: [facility];  Service: Gynecology;  Laterality: Bilateral;  . TUBAL LIGATION    . WISDOM TOOTH EXTRACTION      Family History  Problem Relation Age of Onset  . Breast cancer Maternal Aunt        dx late 50's/60's, died in 64's  . Uterine cancer Mother 73  . Heart failure Mother   . Dementia Father   . Heart attack Paternal Grandfather 93  . Other Sister 36       had abnromal finding on mammogram, unsure if cancer. Was told to 'remove part of of body'    Social History   Socioeconomic History  . Marital status: Single    Spouse name: Not on file  . Number of children: 2  . Years of education: Not on file  . Highest education level: Not on file  Occupational History  . Occupation: Therapist, art  Social Needs  . Financial resource strain: Not  on file  . Food insecurity:    Worry: Not on file    Inability: Not on file  . Transportation needs:    Medical: Not on file    Non-medical: Not on file  Tobacco Use  . Smoking status: Former Smoker    Packs/day: 0.50    Years: 13.00    Pack years: 6.50    Types: Cigarettes    Last attempt to quit: 10/20/2007    Years since quitting: 10.0  . Smokeless tobacco: Never Used  Substance and Sexual Activity  . Alcohol use: No  . Drug use: No  . Sexual activity: Not on file  Lifestyle  . Physical activity:    Days per week: Not on file    Minutes per session: Not on file  . Stress: Not on file  Relationships  . Social connections:    Talks on phone: Not on file     Gets together: Not on file    Attends religious service: Not on file    Active member of club or organization: Not on file    Attends meetings of clubs or organizations: Not on file    Relationship status: Not on file  . Intimate partner violence:    Fear of current or ex partner: Not on file    Emotionally abused: Not on file    Physically abused: Not on file    Forced sexual activity: Not on file  Other Topics Concern  . Not on file  Social History Narrative  . Not on file     Physical Exam  Vital Signs and Nursing Notes reviewed Vitals:   11/08/17 2015 11/08/17 2319  BP: (!) 125/50 (!) 114/53  Pulse: 76 77  Resp: (!) 22 20  Temp: 98 F (36.7 C)   SpO2: 95% 95%    CONSTITUTIONAL: Well-appearing, in moderate to severe distress due to pain NEURO:  Alert and oriented x 3, no focal deficits EYES:  eyes equal and reactive ENT/NECK:  no LAD, no JVD CARDIO: Regular rate, well-perfused, normal S1 and S2 PULM:  CTAB no wheezing or rhonchi GI/GU:  normal bowel sounds, non-distended, non-tender MSK/SPINE:  No gross deformities, no edema, severe pain with any range of motion SKIN:  no rash, atraumatic PSYCH:  Appropriate speech and behavior  Diagnostic and Interventional Summary    EKG Interpretation  Date/Time:    Ventricular Rate:    PR Interval:    QRS Duration:   QT Interval:    QTC Calculation:   R Axis:     Text Interpretation:        Labs Reviewed  LIPASE, BLOOD - Abnormal; Notable for the following components:      Result Value   Lipase 107 (*)    All other components within normal limits  TROPONIN I - Abnormal; Notable for the following components:   Troponin I 0.07 (*)    All other components within normal limits  SEDIMENTATION RATE - Abnormal; Notable for the following components:   Sed Rate 25 (*)    All other components within normal limits  CULTURE, BLOOD (SINGLE)  CBC  COMPREHENSIVE METABOLIC PANEL  LACTIC ACID, PLASMA  CK  C-REACTIVE PROTEIN    URINALYSIS, ROUTINE W REFLEX MICROSCOPIC  I-STAT TROPONIN, ED    No orders to display    Medications  sodium chloride 0.9 % bolus 1,000 mL (0 mLs Intravenous Stopped 11/08/17 2239)  fentaNYL (SUBLIMAZE) injection 100 mcg (100 mcg Intravenous Given 11/08/17 2029)  fentaNYL (  SUBLIMAZE) injection 50 mcg (50 mcg Intravenous Given 11/08/17 2336)  ketorolac (TORADOL) 15 MG/ML injection 15 mg (15 mg Intravenous Given 11/08/17 2137)  acetaminophen (TYLENOL) tablet 650 mg (650 mg Oral Given 11/08/17 2136)     Procedures Critical Care  ED Course and Medical Decision Making  I have reviewed the triage vital signs and the nursing notes.  Pertinent labs & imaging results that were available during my care of the patient were reviewed by me and considered in my medical decision making (see below for details). Clinical Course as of Nov 10 27  Fri Nov 08, 2017  2025 Patient is in exquisite, severe pain, needed full assist with several people helping her to get onto the hospital bed.  No recent trauma.  Hyperthermic here otherwise hemodynamically stable.  No rash, no shortness of breath.  Will provide IV pain medication and reassess for better exam, favoring immunotherapy side effect.   [MB]    Clinical Course User Index [MB] Maudie Flakes, MD    Continues to require multiple doses of IV opioids to control pain.  Admitted to hospital service for further care and evaluation.  Initial troponin 0.07 however no specific chest pain, no EKG changes, second troponin completely negative.  Favoring laboratory error.  Barth Kirks. Sedonia Small, MD [place] [email or web address]  Final Clinical Impressions(s) / ED Diagnoses     ICD-10-CM   1. Intractable pain R52   2. Side effect of drug T88.7XXA   3. Generalized body aches R52     ED Discharge Orders    None         Maudie Flakes, MD 11/09/17 [phone]

## 2017-11-08 NOTE — ED Notes (Signed)
Bed: NK53 Expected date:  Expected time:  Means of arrival:  Comments: For triage

## 2017-11-09 DIAGNOSIS — C541 Malignant neoplasm of endometrium: Secondary | ICD-10-CM | POA: Diagnosis present

## 2017-11-09 DIAGNOSIS — R918 Other nonspecific abnormal finding of lung field: Secondary | ICD-10-CM | POA: Diagnosis not present

## 2017-11-09 DIAGNOSIS — C799 Secondary malignant neoplasm of unspecified site: Secondary | ICD-10-CM | POA: Diagnosis not present

## 2017-11-09 DIAGNOSIS — R52 Pain, unspecified: Secondary | ICD-10-CM | POA: Diagnosis not present

## 2017-11-09 DIAGNOSIS — Z90722 Acquired absence of ovaries, bilateral: Secondary | ICD-10-CM | POA: Diagnosis not present

## 2017-11-09 DIAGNOSIS — M064 Inflammatory polyarthropathy: Secondary | ICD-10-CM | POA: Diagnosis present

## 2017-11-09 DIAGNOSIS — Z79890 Hormone replacement therapy (postmenopausal): Secondary | ICD-10-CM | POA: Diagnosis not present

## 2017-11-09 DIAGNOSIS — K219 Gastro-esophageal reflux disease without esophagitis: Secondary | ICD-10-CM | POA: Diagnosis present

## 2017-11-09 DIAGNOSIS — E039 Hypothyroidism, unspecified: Secondary | ICD-10-CM

## 2017-11-09 DIAGNOSIS — Z791 Long term (current) use of non-steroidal anti-inflammatories (NSAID): Secondary | ICD-10-CM | POA: Diagnosis not present

## 2017-11-09 DIAGNOSIS — E038 Other specified hypothyroidism: Secondary | ICD-10-CM | POA: Diagnosis not present

## 2017-11-09 DIAGNOSIS — C7800 Secondary malignant neoplasm of unspecified lung: Secondary | ICD-10-CM | POA: Diagnosis present

## 2017-11-09 DIAGNOSIS — Z87891 Personal history of nicotine dependence: Secondary | ICD-10-CM | POA: Diagnosis not present

## 2017-11-09 DIAGNOSIS — F419 Anxiety disorder, unspecified: Secondary | ICD-10-CM | POA: Diagnosis present

## 2017-11-09 DIAGNOSIS — R590 Localized enlarged lymph nodes: Secondary | ICD-10-CM | POA: Diagnosis not present

## 2017-11-09 DIAGNOSIS — Z79899 Other long term (current) drug therapy: Secondary | ICD-10-CM | POA: Diagnosis not present

## 2017-11-09 DIAGNOSIS — M069 Rheumatoid arthritis, unspecified: Secondary | ICD-10-CM | POA: Diagnosis present

## 2017-11-09 DIAGNOSIS — Z803 Family history of malignant neoplasm of breast: Secondary | ICD-10-CM | POA: Diagnosis not present

## 2017-11-09 DIAGNOSIS — Z8049 Family history of malignant neoplasm of other genital organs: Secondary | ICD-10-CM | POA: Diagnosis not present

## 2017-11-09 DIAGNOSIS — Z5112 Encounter for antineoplastic immunotherapy: Secondary | ICD-10-CM | POA: Diagnosis not present

## 2017-11-09 DIAGNOSIS — Z885 Allergy status to narcotic agent status: Secondary | ICD-10-CM | POA: Diagnosis not present

## 2017-11-09 DIAGNOSIS — Z923 Personal history of irradiation: Secondary | ICD-10-CM | POA: Diagnosis not present

## 2017-11-09 DIAGNOSIS — Z90710 Acquired absence of both cervix and uterus: Secondary | ICD-10-CM | POA: Diagnosis not present

## 2017-11-09 DIAGNOSIS — F329 Major depressive disorder, single episode, unspecified: Secondary | ICD-10-CM | POA: Diagnosis present

## 2017-11-09 DIAGNOSIS — Z8249 Family history of ischemic heart disease and other diseases of the circulatory system: Secondary | ICD-10-CM | POA: Diagnosis not present

## 2017-11-09 DIAGNOSIS — M609 Myositis, unspecified: Secondary | ICD-10-CM | POA: Diagnosis present

## 2017-11-09 LAB — BASIC METABOLIC PANEL
Anion gap: 6 (ref 5–15)
BUN: 15 mg/dL (ref 8–23)
CALCIUM: 8.6 mg/dL — AB (ref 8.9–10.3)
CHLORIDE: 103 mmol/L (ref 98–111)
CO2: 26 mmol/L (ref 22–32)
CREATININE: 0.86 mg/dL (ref 0.44–1.00)
GFR calc non Af Amer: >60 mL/min (ref >60)
Glucose, Bld: 109 mg/dL — ABNORMAL HIGH (ref 70–99)
Potassium: 3.4 mmol/L — ABNORMAL LOW (ref 3.5–5.1)
SODIUM: 135 mmol/L (ref 135–145)

## 2017-11-09 LAB — CBC WITH DIFFERENTIAL/PLATELET
BASOS PCT: 1 %
Basophils Absolute: 0 10*3/uL (ref 0.0–0.1)
EOS ABS: 0.2 10*3/uL (ref 0.0–0.7)
EOS PCT: 3 %
HCT: 35.1 % — ABNORMAL LOW (ref 36.0–46.0)
Hemoglobin: 11.7 g/dL — ABNORMAL LOW (ref 12.0–15.0)
Lymphocytes Relative: 10 %
Lymphs Abs: 0.6 10*3/uL — ABNORMAL LOW (ref 0.7–4.0)
MCH: 30.4 pg (ref 26.0–34.0)
MCHC: 33.3 g/dL (ref 30.0–36.0)
MCV: 91.2 fL (ref 78.0–100.0)
Monocytes Absolute: 0.4 10*3/uL (ref 0.1–1.0)
Monocytes Relative: 7 %
Neutro Abs: 5.2 10*3/uL (ref 1.7–7.7)
Neutrophils Relative %: 79 %
PLATELETS: 213 10*3/uL (ref 150–400)
RBC: 3.85 MIL/uL — AB (ref 3.87–5.11)
RDW: 14.2 % (ref 11.5–15.5)
WBC: 6.5 10*3/uL (ref 4.0–10.5)

## 2017-11-09 LAB — HIV ANTIBODY (ROUTINE TESTING W REFLEX): HIV SCREEN 4TH GENERATION: NONREACTIVE

## 2017-11-09 LAB — URINALYSIS, ROUTINE W REFLEX MICROSCOPIC
Bilirubin Urine: NEGATIVE
Glucose, UA: NEGATIVE mg/dL
Hgb urine dipstick: NEGATIVE
Ketones, ur: NEGATIVE mg/dL
Leukocytes, UA: NEGATIVE
NITRITE: NEGATIVE
PROTEIN: NEGATIVE mg/dL
Specific Gravity, Urine: 1.005 (ref 1.005–1.030)
pH: 6 (ref 5.0–8.0)

## 2017-11-09 LAB — TSH: TSH: 1.677 u[IU]/mL (ref 0.350–4.500)

## 2017-11-09 LAB — ANA W/REFLEX IF POSITIVE: Anti Nuclear Antibody(ANA): NEGATIVE

## 2017-11-09 MED ORDER — FENTANYL CITRATE (PF) 100 MCG/2ML IJ SOLN
25.0000 ug | INTRAMUSCULAR | Status: DC | PRN
  Filled 2017-11-09: qty 2

## 2017-11-09 MED ORDER — ENOXAPARIN SODIUM 40 MG/0.4ML ~~LOC~~ SOLN
40.0000 mg | SUBCUTANEOUS | Status: DC
  Administered 2017-11-09: 40 mg via SUBCUTANEOUS
  Filled 2017-11-09: qty 0.4

## 2017-11-09 MED ORDER — FLUOXETINE HCL 20 MG PO CAPS
40.0000 mg | ORAL_CAPSULE | Freq: Every day | ORAL | Status: DC
  Administered 2017-11-09 – 2017-11-10 (×2): 40 mg via ORAL
  Filled 2017-11-09 (×2): qty 2

## 2017-11-09 MED ORDER — FENTANYL CITRATE (PF) 100 MCG/2ML IJ SOLN
50.0000 ug | Freq: Once | INTRAMUSCULAR | Status: AC
  Administered 2017-11-09: 50 ug via INTRAVENOUS
  Filled 2017-11-09: qty 2

## 2017-11-09 MED ORDER — PANTOPRAZOLE SODIUM 40 MG PO TBEC
40.0000 mg | DELAYED_RELEASE_TABLET | Freq: Every day | ORAL | Status: DC
  Administered 2017-11-09 – 2017-11-10 (×2): 40 mg via ORAL
  Filled 2017-11-09 (×2): qty 1

## 2017-11-09 MED ORDER — ACETAMINOPHEN 325 MG PO TABS
650.0000 mg | ORAL_TABLET | Freq: Four times a day (QID) | ORAL | Status: DC
  Administered 2017-11-09 – 2017-11-10 (×5): 650 mg via ORAL
  Filled 2017-11-09 (×5): qty 2

## 2017-11-09 MED ORDER — LEVOTHYROXINE SODIUM 100 MCG PO TABS
100.0000 ug | ORAL_TABLET | Freq: Every day | ORAL | Status: DC
  Administered 2017-11-09 – 2017-11-10 (×2): 100 ug via ORAL
  Filled 2017-11-09 (×2): qty 1

## 2017-11-09 MED ORDER — NAPROXEN 250 MG PO TABS
500.0000 mg | ORAL_TABLET | Freq: Two times a day (BID) | ORAL | Status: DC
  Administered 2017-11-09 – 2017-11-10 (×3): 500 mg via ORAL
  Filled 2017-11-09 (×4): qty 2

## 2017-11-09 MED ORDER — DEXTROSE-NACL 5-0.45 % IV SOLN
INTRAVENOUS | Status: DC
  Administered 2017-11-09 – 2017-11-10 (×2): via INTRAVENOUS

## 2017-11-09 MED ORDER — OXYCODONE HCL 5 MG PO TABS: 5.0000 mg | ORAL_TABLET | Freq: Four times a day (QID) | ORAL | Status: DC | PRN

## 2017-11-09 NOTE — Progress Notes (Signed)
IP PROGRESS NOTE  Subjective:   [name] is followed by Dr. Johney Maine which for metastatic endometrial cancer.  She was admitted earlier today with the acute onset of diffuse pain.  She is currently being treated with pembrolizumab last given 11/01/2017 She reports the acute onset of diffuse pain yesterday afternoon.  She felt "cold "at the time.  No fever.  No cough or dysuria.  She has a history of 4-5 loose stools per day since completing pelvic radiation.  This pattern has not changed.  The pain is localized to the joints today.  There has been partial improvement.  She feels her joints are "swollen".  No rash.  Objective: Vital signs in last 24 hours: Blood pressure (!) 107/56, pulse 60, temperature 98.2 F (36.8 C), resp. rate 16, height 5' 1.5" (1.562 m), weight 104 lb (47.2 kg), SpO2 96 %.  Intake/Output from previous day: 08/16 0701 - 08/17 0700 In: 235.8 [I.V.:235.8] Out: -   Physical Exam:  Lungs: End inspiratory rhonchi at the left posterior base, no respiratory distress Cardiac: Regular rate and rhythm Abdomen: Soft, nontender, no hepatosplenomegaly Extremities: No leg edema Skin: Hyperpigmentation over the trunk Musculoskeletal: Pain with motion at multiple joints including the bilateral knee, left shoulder, bilateral wrists joints without erythema.  No apparent effusion.  Mild synovial thickening at the PIP and DIP joints of the right hand  Lab Results: Recent Labs    11/08/17 2040 11/09/17 0535  WBC 9.6 6.5  HGB 13.5 11.7*  HCT 39.5 35.1*  PLT 250 213  Erythrocyte sedimentation rate 11/08/2017: 25  BMET Recent Labs    11/08/17 2040 11/09/17 0535  NA 138 135  K 3.8 3.4*  CL 103 103  CO2 24 26  GLUCOSE 86 109*  BUN 15 15  CREATININE 0.79 0.86  CALCIUM 9.3 8.6*  TSH 11/09/2017: 1.677 CK on 11/08/2017: 41  Medications: I have reviewed the patient's current medications.  Assessment/Plan: 1.  Metastatic endometrial cancer, chest lymphadenopathy and  lung nodules-currently being treated with pembrolizumab in the last given 11/01/2017 2.  History of hypothyroidism 3.  Diffuse pain beginning 11/08/2017- etiology unclear, could be a manifestation of rheumatologic toxicity from pembrolizumab   Ms. Espericueta presents with diffuse body pain, now localizing to the joints.  Rheumatologic toxicity is a well-known complication of PD1 inhibitors and can manifest as inflammatory arthritis, myositis, and as other collagen vascular diseases.  The acute onset of her symptoms seems unusual, but I am suspicious her symptoms are related to toxicity from treatment.  I have a low clinical suspicion for a systemic infection.  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  I recommend beginning treatment with a nonsteroidal anti-inflammatory agent.  If this does not help I would start low-dose prednisone.    Recommendations: 1.  Begin naproxen twice daily 2.  Trial of low-dose prednisone, 10-20 mg daily if naproxen does not help her symptoms 3.  Rheumatology referral if her symptoms persist despite these treatments 4.  Follow-up at the Cancer center with Dr. Alvy Bimler    LOS: 0 days   Betsy Coder, MD   11/09/2017, 10:30 AM

## 2017-11-09 NOTE — ED Notes (Signed)
Purewick placed for comfort. Pillow placed under knees. Pt reports that she is more comfortable.

## 2017-11-09 NOTE — H&P (Signed)
History and Physical    Ashley Villegas SAY:[id number] DOB: 03-31-1950 DOA: 11/08/2017  PCP: [name], MD  Patient coming from: home   Chief Complaint: whole body pain  HPI: Ashley Villegas is a 67 y.o. female with medical history significant for endometrial cancer with metastases to the lung on immunotherapy Beryle Flock) since April, and hypothyroidism, who presents with the above.  First time this has happened. Started relatively abruptly this afternoon. Whole body pain. A deep and stabbing pain. Whole body is tender as well, and somewhat swollen. Left side slightly worse than right. Nothing focal. No specific chest pain. No fevers. Has chronic loose stools, no recent changes. No chills. No nausea/vomiting. Does have decreased appetite. No recent med changes. Did have keytruda infusion about a week ago.   ED Course: labs, pain control  Review of Systems: As per HPI otherwise 10 point review of systems negative.    Past Medical History:  Diagnosis Date  . Anxiety   . Arthritis   . Cancer [facility])    endometrial cancer  . Complication of anesthesia   . Depression   . Family history of uterine cancer   . GERD (gastroesophageal reflux disease)   . History of radiation therapy 11/08/2016-12/27/2016   pelvis 45 Gy in 25 fractions, pelvis boost 14 gy in 7 fractions  . Hypothyroidism    on synthroid   . PONV (postoperative nausea and vomiting)     Past Surgical History:  Procedure Laterality Date  . CESAREAN SECTION    . COLONOSCOPY    . DIAGNOSTIC LAPAROSCOPY     fibroid tumors on her ovaries   . DILATION AND CURETTAGE OF UTERUS    . HYSTEROSCOPY W/D&C N/A 09/15/2014   Procedure: DILATATION AND CURETTAGE /HYSTEROSCOPY/MYOSURE RESECTION OF POLYP;  Surgeon: Janyth Pupa, DO;  Location: [facility];  Service: Gynecology;  Laterality: N/A;  . KNEE SURGERY    . LYMPH NODE DISSECTION N/A 10/26/2014   Procedure: POSS LYMPHADENECTOMY ;  Surgeon: [name], MD;  Location: [facility];  Service: Gynecology;  Laterality: N/A;  . ROBOTIC ASSISTED TOTAL HYSTERECTOMY WITH BILATERAL SALPINGO OOPHERECTOMY Bilateral 10/26/2014   Procedure: ROBOTIC ASSISTED TOTAL HYSTERECTOMY WITH BILATERAL SALPINGO OOPHORECTOMY;  Surgeon: [name], MD;  Location: [facility];  Service: Gynecology;  Laterality: Bilateral;  . TUBAL LIGATION    . WISDOM TOOTH EXTRACTION       reports that she quit smoking about 10 years ago. Her smoking use included cigarettes. She has a 6.50 pack-year smoking history. She has never used smokeless tobacco. She reports that she does not drink alcohol or use drugs.  Allergies  Allergen Reactions  . Hydrocodone Rash    Family History  Problem Relation Age of Onset  . Breast cancer Maternal Aunt        dx late 50's/60's, died in 52's  . Uterine cancer Mother 62  . Heart failure Mother   . Dementia Father   . Heart attack Paternal Grandfather 59  . Other Sister 22       had abnromal finding on mammogram, unsure if cancer. Was told to 'remove part of of body'    Prior to Admission medications   Medication Sig Start Date End Date Taking? Authorizing Provider  acetaminophen (TYLENOL) 325 MG tablet Take 650 mg by mouth every 6 (six) hours as needed for moderate pain.   Yes [provider]  Calcium Carbonate-Vitamin D (CALCIUM-D PO) Take 1 tablet by mouth daily.   Yes [provider]  FLUoxetine (  PROZAC) 40 MG capsule Take 40 mg by mouth daily.   Yes [provider]  levothyroxine (SYNTHROID, LEVOTHROID) 100 MCG tablet Take 100 mcg by mouth daily before breakfast.    Yes [provider]  naproxen sodium (ALEVE) 220 MG tablet Take 220 mg by mouth daily as needed (pain).   Yes [provider]    Physical Exam: Vitals:   11/08/17 1922 11/08/17 1934 11/08/17 2015 11/08/17 2319  BP: 138/69  (!) 125/50 (!) 114/53  Pulse: 80  76 77  Resp: 18  (!) 22 20  Temp: 99.8 F (37.7 C)  98 F (36.7 C)   TempSrc: Oral  Oral     SpO2: 95%  95% 95%  Weight:  47.2 kg    Height:  5' 1.5" (1.562 m)      Constitutional: in moderate pain Head: Atraumatic Eyes: Conjunctiva clear ENM: Moist mucous membranes. Normal dentition.  Neck: Supple Respiratory: Clear to auscultation bilaterally, no wheezing/rales/rhonchi. Normal respiratory effort. No accessory muscle use. . Cardiovascular: Regular rate and rhythm. No murmurs/rubs/gallops. Abdomen: Non-tender, non-distended. No masses. No rebound or guarding. Positive bowel sounds. Musculoskeletal: No joint deformity upper and lower extremities. Normal ROM, no contractures. Normal muscle tone.  Skin: No rashes, lesions, or ulcers.  Extremities: No peripheral edema. Palpable peripheral pulses. Neurologic: Alert, moving all 4 extremities. ttp throughout body, worse on leg muscles Psychiatric: Normal insight and judgement.   Labs on Admission: I have personally reviewed following labs and imaging studies  CBC: Recent Labs  Lab 11/08/17 2040  WBC 9.6  HGB 13.5  HCT 39.5  MCV 89.4  PLT 161   Basic Metabolic Panel: Recent Labs  Lab 11/08/17 2040  NA 138  K 3.8  CL 103  CO2 24  GLUCOSE 86  BUN 15  CREATININE 0.79  CALCIUM 9.3   GFR: Estimated Creatinine Clearance: 51.5 mL/min (by C-G formula based on SCr of 0.79 mg/dL). Liver Function Tests: Recent Labs  Lab 11/08/17 2040  AST 18  ALT 19  ALKPHOS 44  BILITOT 0.9  PROT 6.8  ALBUMIN 3.9   Recent Labs  Lab 11/08/17 2040  LIPASE 107*   No results for input(s): AMMONIA in the last 168 hours. Coagulation Profile: No results for input(s): INR, PROTIME in the last 168 hours. Cardiac Enzymes: Recent Labs  Lab 11/08/17 2040  CKTOTAL 41  TROPONINI 0.07*   BNP (last 3 results) No results for input(s): PROBNP in the last 8760 hours. HbA1C: No results for input(s): HGBA1C in the last 72 hours. CBG: No results for input(s): GLUCAP in the last 168 hours. Lipid Profile: No results for input(s): CHOL,  HDL, LDLCALC, TRIG, CHOLHDL, LDLDIRECT in the last 72 hours. Thyroid Function Tests: No results for input(s): TSH, T4TOTAL, FREET4, T3FREE, THYROIDAB in the last 72 hours. Anemia Panel: No results for input(s): VITAMINB12, FOLATE, FERRITIN, TIBC, IRON, RETICCTPCT in the last 72 hours. Urine analysis:    Component Value Date/Time   COLORURINE YELLOW 10/20/2014 0855   APPEARANCEUR CLOUDY (A) 10/20/2014 0855   LABSPEC 1.022 10/20/2014 0855   PHURINE 6.0 10/20/2014 0855   GLUCOSEU NEGATIVE 10/20/2014 0855   HGBUR TRACE (A) 10/20/2014 0855   BILIRUBINUR NEGATIVE 10/20/2014 0855   KETONESUR NEGATIVE 10/20/2014 0855   PROTEINUR NEGATIVE 10/20/2014 0855   UROBILINOGEN 0.2 10/20/2014 0855   NITRITE NEGATIVE 10/20/2014 0855   LEUKOCYTESUR MODERATE (A) 10/20/2014 0855    Radiological Exams on Admission: No results found.  EKG: Independently reviewed. Nsr, normal axis, normal intervals,  no ischemic changes  Assessment/Plan Principal Problem:   Total body pain Active Problems:   Endometrial cancer ([place])   Encounter for antineoplastic immunotherapy   Metastasis to lung [facility])   Hypothyroid   # whole body pain - I called Dr. Burr Medico, on-call oncologist. Pain is listed as a common side effect to [facility], but Dr. Burr Medico doesn't think this degree of pain likely caused by the med. Immunotherapy can cause a host of rheumatologic reactions. Reassuringly, crp and ck are wnl, as are cmp and cbc. Odd if purely cancer pain that would present so suddenly and diffusely. Has been relieved with fentanyl. Dr. Burr Medico advising pain control overnight. - appreciate ongoing oncology recs - check ana, rf, and anti-ccp for rheumatologic eval - f/u tsh - f/u blood culture - repeat cmp and cbc w/ diff in AM - tylenol standing - oxycodone for mod pain - fentanyl for break-through pain - d51/2ns @ 100 given decreased po  # hypothyroidism - cont synthroid - f/u tsh  DVT prophylaxis: lovenox Code Status: full    Family Communication: son nicholas dominick [phone]  Disposition Plan: tbd  Consults called: oncology (feng)  Admission status: med/surg    Desma Maxim MD [facility] Pager [phone]  If 7PM-7AM, please contact night-coverage [email or web address] Password [email or web address]  11/09/2017, 1:08 AM

## 2017-11-09 NOTE — Progress Notes (Signed)
[facility]                                                                              Patient Demographics  Ashley Villegas, is a 67 y.o. female, DOB - Nov 02, 1950, NUU:[id number]  Admit date - 11/08/2017   Admitting Physician Gwynne Edinger, MD  Outpatient Primary MD for the patient is [name], MD  Outpatient specialists:   LOS - 0  days   Medical records reviewed and are as summarized below:    Chief Complaint  Patient presents with  . generalized body pain  . cancer patient       Brief summary    Ashley Villegas is a 67 y.o. 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  Patient reported as deep and stabbing pain and her whole body felt tender and somewhat swollen.  Nothing focal or chest pain no fevers or chills. Has chronic loose stools, no recent changes. No chills. No nausea/vomiting. Does have decreased appetite. No recent med changes. Did have keytruda infusion about a week ago.   Assessment & Plan    Principal Problem:   Total body pain, myalgias and arthralgias -Unclear etiology, discussed with oncology, Dr. Learta Codding today, could be side effect of Keytruda as an inflammatory arthritis/rheumatoid arthritis -TSH 1.6, ESR 25, follow RA factor, ANA, CCP antibodies, HIV -Follow blood cultures -Ankles, hip joint, knees, fingers examined, no significant effusion however feel patient feels tender -Discussed with oncology, will place on naproxen 500 mg twice a day with PPI. If no improvement in next 24 hours, will start low-dose prednisone in a.m. -Outpatient rheumatology referral  Endometrial cancer with metastasis to lungs -Currently being treated with [facility], last given on 11/01/2017 -Oncology consulted.  Hypothyroidism Continue Synthroid  Code Status: Full CODE  STATUS DVT Prophylaxis:  Lovenox Family Communication: Discussed in detail with the patient, all imaging results, lab results explained to the patient    Disposition Plan: Possible DC in 24 to 48 hours if improving  Time Spent in minutes   25 minutes  Procedures:  None  Consultants:   Oncology  Antimicrobials:      Medications  Scheduled Meds: . acetaminophen  650 mg Oral Q6H  . enoxaparin (LOVENOX) injection  40 mg Subcutaneous Q24H  . FLUoxetine  40 mg Oral Daily  . levothyroxine  100 mcg Oral QAC breakfast  . naproxen  500 mg Oral BID WC  . pantoprazole  40 mg Oral Q0600   Continuous Infusions: . dextrose 5 % and 0.45% NaCl 100 mL/hr at 11/09/17 0338   PRN Meds:.fentaNYL (SUBLIMAZE) injection, oxyCODONE   Antibiotics   Anti-infectives (From admission, onward)   None        Subjective:   Ashley Villegas was seen and examined today.  Feels whole-body pain is improving but today feels pain in the joints and ankles, knees, hips, fingers.  No fevers or chills. Patient denies dizziness, chest pain, shortness of breath, abdominal pain, N/V/D/C, new weakness, numbess, tingling. No  Objective:   Vitals:  11/09/17 0230 11/09/17 0246 11/09/17 0343 11/09/17 0345  BP: (!) 99/54 (!) 92/53 (!) 103/48 (!) 107/56  Pulse:  (!) 57 63 60  Resp:  '16 16 16  ' Temp:  98.2 F (36.8 C)    TempSrc:      SpO2:  97% 96% 96%  Weight:      Height:        Intake/Output Summary (Last 24 hours) at 11/09/2017 1151 Last data filed at 11/09/2017 1100 Gross per 24 hour  Intake 475.84 ml  Output 1000 ml  Net -524.16 ml     Wt Readings from Last 3 Encounters:  11/08/17 47.2 kg  11/01/17 75.9 kg  10/11/17 76.3 kg     Exam  General: Alert and oriented x 3, NAD  Eyes: PERRLA, EOMI, Anicteric Sclera,  HEENT:  Atraumatic, normocephalic, normal oropharynx  Cardiovascular: S1 S2 auscultated, no rubs, murmurs or gallops. Regular rate and rhythm.  Respiratory: Clear  to auscultation bilaterally, no wheezing, rales or rhonchi  Gastrointestinal: Soft, nontender, nondistended, + bowel sounds  Ext: no pedal edema bilaterally  Neuro: no new deficits  Musculoskeletal: No digital cyanosis, clubbing.  Pain with ROM in ankles, knee joints, bilateral hip joints, fingers and wrists.  No significant effusion  Skin: No rashes  Psych: Normal affect and demeanor, alert and oriented x3    Data Reviewed:  I have personally reviewed following labs and imaging studies  Micro Results No results found for this or any previous visit (from the past 240 hour(s)).  Radiology Reports No results found.  Lab Data:  CBC: Recent Labs  Lab 11/08/17 2040 11/09/17 0535  WBC 9.6 6.5  NEUTROABS  --  5.2  HGB 13.5 11.7*  HCT 39.5 35.1*  MCV 89.4 91.2  PLT 250 932   Basic Metabolic Panel: Recent Labs  Lab 11/08/17 2040 11/09/17 0535  NA 138 135  K 3.8 3.4*  CL 103 103  CO2 24 26  GLUCOSE 86 109*  BUN 15 15  CREATININE 0.79 0.86  CALCIUM 9.3 8.6*   GFR: Estimated Creatinine Clearance: 47.9 mL/min (by C-G formula based on SCr of 0.86 mg/dL). Liver Function Tests: Recent Labs  Lab 11/08/17 2040  AST 18  ALT 19  ALKPHOS 44  BILITOT 0.9  PROT 6.8  ALBUMIN 3.9   Recent Labs  Lab 11/08/17 2040  LIPASE 107*   No results for input(s): AMMONIA in the last 168 hours. Coagulation Profile: No results for input(s): INR, PROTIME in the last 168 hours. Cardiac Enzymes: Recent Labs  Lab 11/08/17 2040  CKTOTAL 41  TROPONINI 0.07*   BNP (last 3 results) No results for input(s): PROBNP in the last 8760 hours. HbA1C: No results for input(s): HGBA1C in the last 72 hours. CBG: No results for input(s): GLUCAP in the last 168 hours. Lipid Profile: No results for input(s): CHOL, HDL, LDLCALC, TRIG, CHOLHDL, LDLDIRECT in the last 72 hours. Thyroid Function Tests: Recent Labs    11/09/17 0535  TSH 1.677   Anemia Panel: No results for input(s):  VITAMINB12, FOLATE, FERRITIN, TIBC, IRON, RETICCTPCT in the last 72 hours. Urine analysis:    Component Value Date/Time   COLORURINE YELLOW 11/09/2017 1108   APPEARANCEUR CLEAR 11/09/2017 1108   LABSPEC 1.005 11/09/2017 1108   PHURINE 6.0 11/09/2017 1108   GLUCOSEU NEGATIVE 11/09/2017 1108   HGBUR NEGATIVE 11/09/2017 1108   [place] 11/09/2017 1108   KETONESUR NEGATIVE 11/09/2017 1108   PROTEINUR NEGATIVE 11/09/2017 1108   UROBILINOGEN 0.2 10/20/2014 0855  NITRITE NEGATIVE 11/09/2017 1108   LEUKOCYTESUR NEGATIVE 11/09/2017 1108     Rik Wadel M.D. [facility] 11/09/2017, 11:51 AM  Pager: [phone] Between 7am to 7pm - call Pager - [phone]  After 7pm go to [email or web address] - password [email or web address]  Call night coverage person covering after 7pm

## 2017-11-09 NOTE — Progress Notes (Signed)
Called received from ED nurse Conley Canal, nurse reports something has come up she will call back.

## 2017-11-09 NOTE — ED Notes (Signed)
ED TO INPATIENT HANDOFF REPORT  Name/Age/Gender Ashley Villegas 67 y.o. female  Code Status Code Status History    Date Active Date Inactive Code Status Order ID Comments User Context   10/26/2014 1901 10/27/2014 1351 Full Code [id number]  Lahoma Crocker, MD Inpatient      Home/SNF/Other Home  Chief Complaint body pain   Level of Care/Admitting Diagnosis ED Disposition    ED Disposition Condition [place]: [facility] [032122]  Level of Care: Med-Surg [16]  Diagnosis: Total body pain [482500]  Admitting Physician: Eston Esters  Attending Physician: Gwynne Edinger [[id number]]  Estimated length of stay: past midnight tomorrow  Certification:: I certify this patient will need inpatient services for at least 2 midnights  PT Class (Do Not Modify): Inpatient [101]  PT Acc Code (Do Not Modify): Private [1]       Medical History Past Medical History:  Diagnosis Date  . Anxiety   . Arthritis   . Cancer [facility])    endometrial cancer  . Complication of anesthesia   . Depression   . Family history of uterine cancer   . GERD (gastroesophageal reflux disease)   . History of radiation therapy 11/08/2016-12/27/2016   pelvis 45 Gy in 25 fractions, pelvis boost 14 gy in 7 fractions  . Hypothyroidism    on synthroid   . PONV (postoperative nausea and vomiting)     Allergies Allergies  Allergen Reactions  . Hydrocodone Rash    IV Location/Drains/Wounds Patient Lines/Drains/Airways Status   Active Line/Drains/Airways    Name:   Placement date:   Placement time:   Site:   Days:   Peripheral IV 11/08/17 Right Antecubital   11/08/17    2030    Antecubital   1   Closed System Drain 1 Right Abdomen Other (Comment) 12 Fr.   11/25/14    1459    Abdomen   1080   Incision (Closed) 10/26/14 Abdomen   10/26/14    1740     1110   Incision - 5 Ports Abdomen Right;Lateral Mid Left;Medial Left;Lower Left;Lateral   10/26/14     1550     1110          Labs/Imaging Results for orders placed or performed during the hospital encounter of 11/08/17 (from the past 48 hour(s))  CBC     Status: None   Collection Time: 11/08/17  8:40 PM  Result Value Ref Range   WBC 9.6 4.0 - 10.5 K/uL   RBC 4.42 3.87 - 5.11 MIL/uL   Hemoglobin 13.5 12.0 - 15.0 g/dL   HCT 39.5 36.0 - 46.0 %   MCV 89.4 78.0 - 100.0 fL   MCH 30.5 26.0 - 34.0 pg   MCHC 34.2 30.0 - 36.0 g/dL   RDW 13.7 11.5 - 15.5 %   Platelets 250 150 - 400 K/uL    Comment: Performed at [facility], [place] [address] [place], [place] [address]  Comprehensive metabolic panel     Status: None   Collection Time: 11/08/17  8:40 PM  Result Value Ref Range   Sodium 138 135 - 145 mmol/L   Potassium 3.8 3.5 - 5.1 mmol/L   Chloride 103 98 - 111 mmol/L   CO2 24 22 - 32 mmol/L   Glucose, Bld 86 70 - 99 mg/dL   BUN 15 8 - 23 mg/dL   Creatinine, Ser 0.79 0.44 - 1.00 mg/dL   Calcium 9.3 8.9 -  10.3 mg/dL   Total Protein 6.8 6.5 - 8.1 g/dL   Albumin 3.9 3.5 - 5.0 g/dL   AST 18 15 - 41 U/L   ALT 19 0 - 44 U/L   Alkaline Phosphatase 44 38 - 126 U/L   Total Bilirubin 0.9 0.3 - 1.2 mg/dL   GFR calc non Af Amer >60 >60 mL/min   GFR calc Af Amer >60 >60 mL/min    Comment: (NOTE) The eGFR has been calculated using the CKD EPI equation. This calculation has not been validated in all clinical situations. eGFR's persistently <60 mL/min signify possible Chronic Kidney Disease.    Anion gap 11 5 - 15    Comment: Performed at [facility], [place] [address], [place], [place] [address]  Lipase, blood     Status: Abnormal   Collection Time: 11/08/17  8:40 PM  Result Value Ref Range   Lipase 107 (H) 11 - 51 U/L    Comment: Performed at [facility], [place] [address], [place], [place] [address]  Troponin I     Status: Abnormal   Collection Time: 11/08/17  8:40 PM  Result Value Ref Range   Troponin I 0.07 (HH) <0.03 ng/mL     Comment: CRITICAL RESULT CALLED TO, READ BACK BY AND VERIFIED WITH: Joneen Caraway RN 2127 11/08/2017 HILL K Performed at [facility], [place] [address], [place], [place] [address]   Lactic acid, plasma     Status: None   Collection Time: 11/08/17  8:40 PM  Result Value Ref Range   Lactic Acid, Venous 1.5 0.5 - 1.9 mmol/L    Comment: Performed at [facility], [place] [address] [place], [place] [address]  CK     Status: None   Collection Time: 11/08/17  8:40 PM  Result Value Ref Range   Total CK 41 38 - 234 U/L    Comment: Performed at [facility], [place] [address], [place], [place] [address]  Sedimentation rate     Status: Abnormal   Collection Time: 11/08/17  8:40 PM  Result Value Ref Range   Sed Rate 25 (H) 0 - 22 mm/hr    Comment: Performed at [facility], [place] [address], [place], [place] [address]  C-reactive protein     Status: None   Collection Time: 11/08/17  8:40 PM  Result Value Ref Range   CRP <0.8 <1.0 mg/dL    Comment: Performed at [facility], [place] [address], [place], [place] [address]  I-stat troponin, ED     Status: None   Collection Time: 11/08/17 11:19 PM  Result Value Ref Range   Troponin i, poc 0.00 0.00 - 0.08 ng/mL   Comment 3            Comment: Due to the release kinetics of cTnI, a negative result within the first hours of the onset of symptoms does not rule out myocardial infarction with certainty. If myocardial infarction is still suspected, repeat the test at appropriate intervals.    No results found.  Pending Labs Unresulted Labs (From admission, onward)    Start     Ordered   11/09/17 0103  ANA w/Reflex if Positive  Once,   R     11/09/17 0102   77/41/28 7867  CYCLIC CITRUL PEPTIDE ANTIBODY, IGG/IGA  Once,   R     11/09/17 0102   11/09/17 0103  TSH  Once,   R  11/09/17 0102   11/09/17 0102  Rheumatoid factor  Once,   R     11/09/17 0102   11/08/17 2026   Culture, blood (single)  STAT,   STAT     11/08/17 2025   11/08/17 2025  Urinalysis, Routine w reflex microscopic  STAT,   R     11/08/17 2024   Signed and Held  HIV antibody (Routine Testing)  Once,   R     Signed and Held   Signed and Held  CBC  (enoxaparin (LOVENOX)    CrCl >/= 30 ml/min)  Once,   R    Comments:  Baseline for enoxaparin therapy IF NOT ALREADY DRAWN.  Notify MD if PLT < 100 K.    Signed and Held   Signed and Held  Creatinine, serum  (enoxaparin (LOVENOX)    CrCl >/= 30 ml/min)  Once,   R    Comments:  Baseline for enoxaparin therapy IF NOT ALREADY DRAWN.    Signed and Held   Signed and Held  Creatinine, serum  (enoxaparin (LOVENOX)    CrCl >/= 30 ml/min)  Weekly,   R    Comments:  while on enoxaparin therapy    Signed and Held   Signed and Held  Basic metabolic panel  Tomorrow morning,   R     Signed and Held   Signed and Held  CBC with Differential/Platelet  Tomorrow morning,   R     Signed and Held          Vitals/Pain Today's Vitals   11/08/17 2319 11/09/17 0039 11/09/17 0040 11/09/17 0136  BP: (!) 114/53     Pulse: 77     Resp: 20     Temp:      TempSrc:      SpO2: 95%     Weight:      Height:      PainSc:  _0 Isolation Precautions No active isolations  Medications Medications  sodium chloride 0.9 % bolus 1,000 mL (0 mLs Intravenous Stopped 11/08/17 2239)  fentaNYL (SUBLIMAZE) injection 100 mcg (100 mcg Intravenous Given 11/08/17 2029)  fentaNYL (SUBLIMAZE) injection 50 mcg (50 mcg Intravenous Given 11/08/17 2336)  ketorolac (TORADOL) 15 MG/ML injection 15 mg (15 mg Intravenous Given 11/08/17 2137)  acetaminophen (TYLENOL) tablet 650 mg (650 mg Oral Given 11/08/17 2136)  fentaNYL (SUBLIMAZE) injection 50 mcg (50 mcg Intravenous Given 11/09/17 0136)    Mobility walks

## 2017-11-10 DIAGNOSIS — E038 Other specified hypothyroidism: Secondary | ICD-10-CM

## 2017-11-10 DIAGNOSIS — C541 Malignant neoplasm of endometrium: Secondary | ICD-10-CM

## 2017-11-10 LAB — BASIC METABOLIC PANEL
ANION GAP: 4 — AB (ref 5–15)
BUN: 10 mg/dL (ref 8–23)
CO2: 29 mmol/L (ref 22–32)
Calcium: 8.4 mg/dL — ABNORMAL LOW (ref 8.9–10.3)
Chloride: 109 mmol/L (ref 98–111)
Creatinine, Ser: 0.81 mg/dL (ref 0.44–1.00)
Glucose, Bld: 102 mg/dL — ABNORMAL HIGH (ref 70–99)
POTASSIUM: 4.1 mmol/L (ref 3.5–5.1)
SODIUM: 142 mmol/L (ref 135–145)

## 2017-11-10 LAB — RHEUMATOID FACTOR: Rhuematoid fact SerPl-aCnc: <10 IU/mL (ref 0.0–13.9)

## 2017-11-10 LAB — CYCLIC CITRUL PEPTIDE ANTIBODY, IGG/IGA: CCP Antibodies IgG/IgA: 4 units (ref 0–19)

## 2017-11-10 MED ORDER — PANTOPRAZOLE SODIUM 40 MG PO TBEC: 40.0000 mg | DELAYED_RELEASE_TABLET | Freq: Every day | ORAL | 3 refills | Status: DC

## 2017-11-10 MED ORDER — NAPROXEN 500 MG PO TABS: 500.0000 mg | ORAL_TABLET | Freq: Two times a day (BID) | ORAL | 1 refills | Status: DC

## 2017-11-10 MED ORDER — TRAMADOL HCL 50 MG PO TABS: 50.0000 mg | ORAL_TABLET | Freq: Three times a day (TID) | ORAL | 0 refills | Status: DC | PRN

## 2017-11-10 NOTE — Evaluation (Signed)
Physical Therapy One-time Evaluation Patient Details Name: Ashley Villegas MRN: [id number] DOB: 25-Aug-1950 Today's Date: 11/10/2017   History of Present Illness  Ashley Villegas is a 67 y.o. 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  Clinical Impression  Patient presents at baseline level for mobility with no pain currently.  Reports will have several more infusions that may have side effect of pain so educated in use of walker as needed for pain and how to manage on turns and in small spaces.  Recommend walker for home, but no further skilled PT needs at this time.     Follow Up Recommendations No PT follow up    Equipment Recommendations  Rolling walker with 5" wheels    Recommendations for Other Services       Precautions / Restrictions Precautions Precautions: None      Mobility  Bed Mobility               General bed mobility comments: standing in room upon my entry  Transfers Overall transfer level: Independent                  Ambulation/Gait Ambulation/Gait assistance: Independent Gait Distance (Feet): 200 Feet Assistive device: None;Rolling walker (2 wheeled) Gait Pattern/deviations: WFL(Within Functional Limits)     General Gait Details: no walker during initial ambulation, then demonstrated and pt used walker in room to allow for use if needed with future myalgias/arthralgias as side effect of treatments  Stairs Stairs: Yes Stairs assistance: Modified independent (Device/Increase time) Stair Management: One rail Left;Alternating pattern;Step to pattern;Forwards Number of Stairs: 4 General stair comments: cues for step to sequence due to knee pain  Wheelchair Mobility    Modified Rankin (Stroke Patients Only)       Balance Overall balance assessment: Independent                                            Pertinent Vitals/Pain Pain Assessment: No/denies pain    Home Living Family/patient expects to be discharged to:: Private residence Living Arrangements: Children(son) Available Help at Discharge: Family Type of Home: House Home Access: Stairs to enter Entrance Stairs-Rails: Psychiatric nurse of Steps: 5 Home Layout: One level Home Equipment: None      Prior Function Level of Independence: Independent         Comments: drives to chemo; works from home, Therapist, art for [employer]        Extremity/Trunk Assessment        Lower Extremity Assessment Lower Extremity Assessment: Overall WFL for tasks assessed       Communication   Communication: No difficulties  Cognition Arousal/Alertness: Awake/alert Behavior During Therapy: WFL for tasks assessed/performed Overall Cognitive Status: Within Functional Limits for tasks assessed                                        General Comments      Exercises     Assessment/Plan    PT Assessment Patent does not need any further PT services  PT Problem List         PT Treatment Interventions  PT Goals (Current goals can be found in the Care Plan section)  Acute Rehab PT Goals PT Goal Formulation: All assessment and education complete, DC therapy    Frequency     Barriers to discharge        Co-evaluation               AM-PAC PT "6 Clicks" Daily Activity  Outcome Measure Difficulty turning over in bed (including adjusting bedclothes, sheets and blankets)?: None Difficulty moving from lying on back to sitting on the side of the bed? : None Difficulty sitting down on and standing up from a chair with arms (e.g., wheelchair, bedside commode, etc,.)?: None Help needed moving to and from a bed to chair (including a wheelchair)?: None Help needed walking in hospital room?: None Help needed climbing 3-5  steps with a railing? : A Little 6 Click Score: 23    End of Session   Activity Tolerance: Patient tolerated treatment well Patient left: with call bell/phone within reach   PT Visit Diagnosis: Other abnormalities of gait and mobility (R26.89)    Time: 1050-1107 PT Time Calculation (min) (ACUTE ONLY): 17 min   Charges:   PT Evaluation $PT Eval Low Complexity: [place], [place] [phone] 11/10/2017   Reginia Naas 11/10/2017, 11:48 AM

## 2017-11-10 NOTE — Progress Notes (Signed)
Discharge instructions/prescriptions given/explained with pt verbalizing understanding. Pt aware of followup appointments. Pt has d/c from hospital with her son and her new walker.

## 2017-11-10 NOTE — Care Management (Signed)
Contacted [facility] for RW for home. Harlin Mazzoni RN CCM Case Mgmt phone [phone] 

## 2017-11-10 NOTE — Discharge Summary (Signed)
Physician Discharge Summary   Patient ID: Ashley Villegas MRN: [id number] DOB/AGE: [birth year] 67 y.o.  Admit date: 11/08/2017 Discharge date: 11/10/2017  Primary Care Physician:  [name], MD   Recommendations for Outpatient Follow-up:  1. Follow up with PCP in 1-2 weeks 2. Patient will need rheumatology referral if arthralgias/autoimmune arthritis did not improve 3.  Follow RA factor, anti-CCP antibodies  Home Health: None  Equipment/Devices: Rolling walker  Discharge Condition: stable  CODE STATUS: FULL  Diet recommendation: Regular diet   Discharge Diagnoses:    . Endometrial cancer ([place]) . 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 Learta Codding   Allergies:   Allergies  Allergen Reactions  . Hydrocodone Rash     DISCHARGE MEDICATIONS: Allergies as of 11/10/2017      Reactions   Hydrocodone Rash      Medication List    STOP taking these medications   naproxen sodium 220 MG tablet Commonly known as:  ALEVE     TAKE these medications   acetaminophen 325 MG tablet Commonly known as:  TYLENOL Take 650 mg by mouth every 6 (six) hours as needed for moderate pain.   CALCIUM-D PO Take 1 tablet by mouth daily.   FLUoxetine 40 MG capsule Commonly known as:  PROZAC Take 40 mg by mouth daily.   levothyroxine 100 MCG tablet Commonly known as:  SYNTHROID, LEVOTHROID Take 100 mcg by mouth daily before breakfast.   naproxen 500 MG tablet Commonly known as:  NAPROSYN Take 1 tablet (500 mg total) by mouth 2 (two) times daily with a meal.   pantoprazole 40 MG tablet Commonly known as:  PROTONIX Take 1 tablet (40 mg total) by mouth daily before breakfast. While taking naproxen. Any generic PPI is okay.   traMADol 50 MG tablet Commonly known as:  ULTRAM Take 1 tablet (50 mg total) by mouth every 8 (eight) hours as needed for severe pain.            Durable  Medical Equipment  (From admission, onward)         Start     Ordered   11/10/17 1128  For home use only DME Walker rolling  Once    Question:  Patient needs a walker to treat with the following condition  Answer:  Gait instability   11/10/17 1127           Brief H and P: For complete details please refer to admission H and P, but in brief Ashley Villegas a 67 y.o.femalewith medical history significant forendometrial cancer with metastases to the lung on immunotherapy Beryle Flock) since April, and hypothyroidism, who presented with diffuse body pain on  admission.  Patient reported as deep and stabbing pain and her whole body felt tender and somewhat swollen.  Nothing focal or chest pain no fevers or chills. Has chronic loose stools, no recent changes. No chills. No nausea/vomiting. Does have decreased appetite. No recent med changes. Did have keytruda infusion about a week ago  Hospital Course:    Total body pain, myalgias and arthralgias -Oncology consulted, possible side effect of Keytruda as an inflammatory arthritis/rheumatoid arthritis per Dr. Learta Codding -TSH 1.6, ESR 25, ANA negative, HIV nonreactive, follow RA factor,  CCP antibodies -Blood cultures negative so far -Per oncology recommendations, placed on naproxen 500 mg twice a day with PPI.  -Patient has significant improvement with naproxen and hence did not need any prednisone. -Outpatient rheumatology referral   Endometrial cancer  with metastasis to lungs -Currently being treated with [facility], last given on 11/01/2017 -Oncology consulted, outpatient appointment with Dr. Alvy Bimler scheduled.  Hypothyroidism Continue Synthroid   Day of Discharge S: Feels a lot better today, eager to go home  BP 134/65 (BP Location: Left Arm)   Pulse (!) 58   Temp 98.1 F (36.7 C) (Oral)   Resp 13   Ht 5' 1.5" (1.562 m)   Wt 47.2 kg   SpO2 99%   BMI 19.33 kg/m   Physical Exam: General: Alert and awake oriented x3  not in any acute distress. HEENT: anicteric sclera, pupils reactive to light and accommodation CVS: S1-S2 clear no murmur rubs or gallops Chest: clear to auscultation bilaterally, no wheezing rales or rhonchi Abdomen: soft nontender, nondistended, normal bowel sounds Extremities: no cyanosis, clubbing or edema noted bilaterally Neuro: Cranial nerves II-XII intact, no focal neurological deficits   The results of significant diagnostics from this hospitalization (including imaging, microbiology, ancillary and laboratory) are listed below for reference.      Procedures/Studies:   No results found.    LAB RESULTS: Basic Metabolic Panel: Recent Labs  Lab 11/09/17 0535 11/10/17 0531  NA 135 142  K 3.4* 4.1  CL 103 109  CO2 26 29  GLUCOSE 109* 102*  BUN 15 10  CREATININE 0.86 0.81  CALCIUM 8.6* 8.4*   Liver Function Tests: Recent Labs  Lab 11/08/17 2040  AST 18  ALT 19  ALKPHOS 44  BILITOT 0.9  PROT 6.8  ALBUMIN 3.9   Recent Labs  Lab 11/08/17 2040  LIPASE 107*   No results for input(s): AMMONIA in the last 168 hours. CBC: Recent Labs  Lab 11/08/17 2040 11/09/17 0535  WBC 9.6 6.5  NEUTROABS  --  5.2  HGB 13.5 11.7*  HCT 39.5 35.1*  MCV 89.4 91.2  PLT 250 213   Cardiac Enzymes: Recent Labs  Lab 11/08/17 2040  CKTOTAL 41  TROPONINI 0.07*   BNP: Invalid input(s): POCBNP CBG: No results for input(s): GLUCAP in the last 168 hours.    Disposition and Follow-up: Discharge Instructions    Diet general   Complete by:  As directed    Discharge instructions   Complete by:  As directed    Please take naproxen twice a day with food until you see Dr Alvy Bimler. You may need referral to rheumatologist doctor if no significant improvement in next 1-2 weeks.   Increase activity slowly   Complete by:  As directed        DISPOSITION: [place]    [name], MD. Schedule an appointment as soon as possible for  a visit in 2 week(s).   Specialty:  Internal Medicine Contact information: [address] [place] [address] [phone]        Heath Lark, MD Follow up on 11/22/2017.   Specialty:  Hematology and Oncology Why:  at 12PM Contact information: [place] [address] [phone]            Time coordinating discharge:  25 minutes Signed:   Estill Cotta M.D. [facility] 11/10/2017, 11:54 AM Pager: [phone]

## 2017-11-11 ENCOUNTER — Other Ambulatory Visit: Payer: Self-pay | Admitting: Hematology and Oncology

## 2017-11-11 ENCOUNTER — Telehealth: Payer: Self-pay

## 2017-11-11 DIAGNOSIS — C541 Malignant neoplasm of endometrium: Secondary | ICD-10-CM

## 2017-11-11 DIAGNOSIS — E559 Vitamin D deficiency, unspecified: Secondary | ICD-10-CM | POA: Insufficient documentation

## 2017-11-11 NOTE — Telephone Encounter (Signed)
She called and left a message. Does Dr. Alvy Bimler want to see her earlier than 8/30 appt since she went to the hospital?

## 2017-11-11 NOTE — Telephone Encounter (Signed)
Called and given below message. She verbalized understanding. She said she is doing good today and will keep her appt on 8/30. Instructed to call office if anything changes.

## 2017-11-11 NOTE — Telephone Encounter (Signed)
First available appt I have is next Tuesday. If she feels ok I suggest we just meet again on 8/30

## 2017-11-13 LAB — CULTURE, BLOOD (SINGLE)
Culture: NO GROWTH
SPECIAL REQUESTS: ADEQUATE

## 2017-11-22 ENCOUNTER — Inpatient Hospital Stay: Payer: 59

## 2017-11-22 ENCOUNTER — Telehealth: Payer: Self-pay | Admitting: Hematology and Oncology

## 2017-11-22 ENCOUNTER — Inpatient Hospital Stay (HOSPITAL_BASED_OUTPATIENT_CLINIC_OR_DEPARTMENT_OTHER): Payer: 59 | Admitting: Hematology and Oncology

## 2017-11-22 VITALS — BP 123/64 | HR 82 | Temp 98.0°F | Resp 16 | Ht 61.5 in | Wt 173.1 lb

## 2017-11-22 DIAGNOSIS — E038 Other specified hypothyroidism: Secondary | ICD-10-CM

## 2017-11-22 DIAGNOSIS — Z5112 Encounter for antineoplastic immunotherapy: Secondary | ICD-10-CM | POA: Diagnosis not present

## 2017-11-22 DIAGNOSIS — E039 Hypothyroidism, unspecified: Secondary | ICD-10-CM | POA: Diagnosis not present

## 2017-11-22 DIAGNOSIS — R21 Rash and other nonspecific skin eruption: Secondary | ICD-10-CM | POA: Diagnosis not present

## 2017-11-22 DIAGNOSIS — E559 Vitamin D deficiency, unspecified: Secondary | ICD-10-CM

## 2017-11-22 DIAGNOSIS — C541 Malignant neoplasm of endometrium: Secondary | ICD-10-CM | POA: Diagnosis not present

## 2017-11-22 DIAGNOSIS — R52 Pain, unspecified: Secondary | ICD-10-CM

## 2017-11-22 DIAGNOSIS — C7800 Secondary malignant neoplasm of unspecified lung: Secondary | ICD-10-CM | POA: Diagnosis not present

## 2017-11-22 DIAGNOSIS — R590 Localized enlarged lymph nodes: Secondary | ICD-10-CM | POA: Diagnosis not present

## 2017-11-22 LAB — CBC WITH DIFFERENTIAL (CANCER CENTER ONLY)
BASOS ABS: 0.1 10*3/uL (ref 0.0–0.1)
BASOS PCT: 2 %
Eosinophils Absolute: 0.3 10*3/uL (ref 0.0–0.5)
Eosinophils Relative: 8 %
HEMATOCRIT: 36.4 % (ref 34.8–46.6)
Hemoglobin: 12.2 g/dL (ref 11.6–15.9)
LYMPHS PCT: 12 %
Lymphs Abs: 0.5 10*3/uL — ABNORMAL LOW (ref 0.9–3.3)
MCH: 30 pg (ref 25.1–34.0)
MCHC: 33.5 g/dL (ref 31.5–36.0)
MCV: 89.7 fL (ref 79.5–101.0)
Monocytes Absolute: 0.3 10*3/uL (ref 0.1–0.9)
Monocytes Relative: 6 %
NEUTROS ABS: 3.2 10*3/uL (ref 1.5–6.5)
Neutrophils Relative %: 72 %
PLATELETS: 235 10*3/uL (ref 145–400)
RBC: 4.06 MIL/uL (ref 3.70–5.45)
RDW: 13.8 % (ref 11.2–14.5)
WBC Count: 4.4 10*3/uL (ref 3.9–10.3)

## 2017-11-22 LAB — CMP (CANCER CENTER ONLY)
ALBUMIN: 3.6 g/dL (ref 3.5–5.0)
ALK PHOS: 50 U/L (ref 38–126)
ALT: 14 U/L (ref 0–44)
ANION GAP: 9 (ref 5–15)
AST: 12 U/L — ABNORMAL LOW (ref 15–41)
BILIRUBIN TOTAL: 0.4 mg/dL (ref 0.3–1.2)
BUN: 18 mg/dL (ref 8–23)
CALCIUM: 9.6 mg/dL (ref 8.9–10.3)
CO2: 23 mmol/L (ref 22–32)
Chloride: 110 mmol/L (ref 98–111)
Creatinine: 0.85 mg/dL (ref 0.44–1.00)
Glucose, Bld: 152 mg/dL — ABNORMAL HIGH (ref 70–99)
POTASSIUM: 3.9 mmol/L (ref 3.5–5.1)
Sodium: 142 mmol/L (ref 135–145)
TOTAL PROTEIN: 6.3 g/dL — AB (ref 6.5–8.1)

## 2017-11-22 LAB — TSH: TSH: 4.43 u[IU]/mL — ABNORMAL HIGH (ref 0.308–3.960)

## 2017-11-22 MED ORDER — HYDROMORPHONE HCL 4 MG PO TABS: 4.0000 mg | ORAL_TABLET | ORAL | 0 refills | Status: DC | PRN

## 2017-11-22 MED ORDER — PREDNISONE 20 MG PO TABS: 20.0000 mg | ORAL_TABLET | Freq: Every day | ORAL | 0 refills | Status: DC

## 2017-11-22 MED FILL — predniSONE 20 MG TABS: 20 | 10 days supply | Qty: 10 | Fill #0

## 2017-11-22 MED FILL — HYDROmorphone HCL 4 MG TABS: 4 | 5 days supply | Qty: 30 | Fill #0

## 2017-11-22 NOTE — Telephone Encounter (Signed)
Gave pt avs and calendar  °

## 2017-11-23 ENCOUNTER — Encounter: Payer: Self-pay | Admitting: Hematology and Oncology

## 2017-11-23 LAB — VITAMIN D 25 HYDROXY (VIT D DEFICIENCY, FRACTURES): Vit D, 25-Hydroxy: 32.4 ng/mL (ref 30.0–100.0)

## 2017-11-23 NOTE — Assessment & Plan Note (Signed)
She has significant diffuse body pain culminating to admission to the hospital I recommend low-dose prednisone therapy I also recommend low-dose narcotic prescription if not controlled with steroids We discussed potential risk of sedation and constipation with narcotic prescription I plan to reassess her pain again in 10 days

## 2017-11-23 NOTE — Assessment & Plan Note (Signed)
She has significant pain after each dose of treatment with mild skin rashes It is likely related to side effects of checkpoint inhibitor At present time, she has significant decline in performance status due to side effects of treatment I recommend holding treatment today and repeat imaging study in the week If imaging study showed minimal residual disease, we can consider stopping treatment However, if imaging study showed disease progression, we will consider switching therapy She agree with the plan of care

## 2017-11-23 NOTE — Assessment & Plan Note (Signed)
TSH is mildly elevated Observe only for now

## 2017-11-23 NOTE — Progress Notes (Signed)
[place] OFFICE PROGRESS NOTE  Patient Care Team: [name], MD as PCP - General (Internal Medicine) Jacelyn Pi, MD as Consulting Physician (Endocrinology) Heath Lark, MD as Consulting Physician (Hematology and Oncology)  ASSESSMENT & PLAN:  Endometrial cancer [facility]) She has significant pain after each dose of treatment with mild skin rashes It is likely related to side effects of checkpoint inhibitor At present time, she has significant decline in performance status due to side effects of treatment I recommend holding treatment today and repeat imaging study in the week If imaging study showed minimal residual disease, we can consider stopping treatment However, if imaging study showed disease progression, we will consider switching therapy She agree with the plan of care  Total body pain She has significant diffuse body pain culminating to admission to the hospital I recommend low-dose prednisone therapy I also recommend low-dose narcotic prescription if not controlled with steroids We discussed potential risk of sedation and constipation with narcotic prescription I plan to reassess her pain again in 10 days  Hypothyroidism TSH is mildly elevated Observe only for now   Orders Placed This Encounter  Procedures  . CT ABDOMEN PELVIS W CONTRAST    Standing Status:   Future    Standing Expiration Date:   11/23/2018    Order Specific Question:   If indicated for the ordered procedure, I authorize the administration of contrast media per Radiology protocol    Answer:   Yes    Order Specific Question:   Preferred imaging location?    Answer:   [facility]    Order Specific Question:   Radiology Contrast Protocol - do NOT remove file path    Answer:   \\[email or web address]  . CT CHEST W CONTRAST    Standing Status:   Future    Standing Expiration Date:   11/23/2018    Order Specific Question:   If indicated for the ordered  procedure, I authorize the administration of contrast media per Radiology protocol    Answer:   Yes    Order Specific Question:   Preferred imaging location?    Answer:   [facility]    Order Specific Question:   Radiology Contrast Protocol - do NOT remove file path    Answer:   \\[email or web address]    INTERVAL HISTORY: Please see below for problem oriented charting. She returns for further follow-up She continues to have diffuse bone pain throughout her body, not well controlled with NSAID or tramadol She denies recent cough, chest pain or shortness of breath Denies recent constipation I have reviewed her records extensively, pertaining to her recent hospitalization She denies abnormal skin rash with recent treatment  SUMMARY OF ONCOLOGIC HISTORY: Oncology History   Abnormal MSI, germline mutation was negative Endometrioid     Endometrial cancer ([place])   09/15/2014 Pathology Results    Endometrium, curettage - ENDOMETRIAL ADENOCARCINOMA. - SEE COMMENT. Microscopic Comment Although definitive characterization is best performed on resection specimen, as sampled, the endometrial adenocarcinoma appears to be endometrioid subtype, FIGO grade 1.     09/15/2014 Surgery    PreOp: postmenopausal bleeding, cervical stenosis PostOp: same and uterine polyp Procedure:  Hysteroscopy, Dilation and Curettage, Myosure polypectomy Surgeon: Dr. Janyth Pupa  Findings:8cm uterus with thickened polypoid-like endometrium, large 2cm irregular appearing polyp  Specimens: 1) endometrial curettings    10/26/2014 Pathology Results    1. Uterus +/- tubes/ovaries, neoplastic - INVASIVE ADENOCARCINOMA, ENDOMETRIOID TYPE, SEE COMMENT. - TUMOR INVOLVES LESS THAN ONE HALF  MYOMETRIAL THICKNESS. - TUMOR INVOLVES UTERINE ADENOMYOSIS. - BENIGN LEIOMYOMATA (UP TO 2.5 CM). - BENIGN CERVICAL MUCOSA; NEGATIVE FOR INTRAEPITHELIAL LESION OR MALIGNANCY. - BENIGN RIGHT AND LEFT  OVARIES; NEGATIVE FOR ATYPIA OR MALIGNANCY. - BENIGN RIGHT AND LEFT FALLOPIAN TUBE; NEGATIVE FOR ATYPIA OR MALIGNANCY. - BENIGN PARATUBAL CYSTS; NEGATIVE FOR ATYPIA OR MALIGNANCY. - SEE TUMOR SYNOPTIC TEMPLATE BELOW. 2. Lymph nodes, regional resection, right pelvic - FOUR LYMPH NODES, NEGATIVE FOR TUMOR (0/4). 3. Lymph nodes, regional resection, left pelvic - FOUR LYMPH NODES, NEGATIVE FOR TUMOR (0/4). Microscopic Comment 1. ONCOLOGY TABLE-UTERUS, CARCINOMA OR CARCINOSARCOMA Specimen: Uterus and bilateral fallopian tubes and ovaries. Procedure: Hysterectomy and bilateral salpingo-oophorectomy. Lymph node sampling performed: Yes. Specimen integrity: Intact. Maximum tumor size: 4.0 cm (tumor involved entire endometrium) Histologic type: Adenocarcinoma, endometrioid type. Grade: 1 Myometrial invasion: 0.5 cm where myometrium is 1.5 cm in thickness Cervical stromal involvement: Absent. Extent of involvement of other organs: None Lymph - vascular invasion: Absent. Peritoneal washings: N/A Lymph nodes: # examined - 8 ; # positive - 0 Pelvic lymph nodes: N/A involved of N/A lymph nodes. Para-aortic lymph nodes: N/A involved of N/A lymph nodes. Other (specify involvement and site): N/A TNM code: pT1a, pN0 FIGO Stage (based on pathologic findings, needs clinical correlation): N/A Comments: None MSI testing abnormal    10/26/2014 Surgery    Surgery: Total robotic hysterectomy, bilateral salpingo-oophorectomy, bilateral pelvic lymph node dissection.   Surgeons:  Lucita Lora. Alycia Rossetti, MD; Lahoma Crocker, MD   Pathology: Uterus, cervix, bilateral tubes and ovaries, bilateral pelvic lymph nodes to pathology.   Operative findings: Omentum adherent to midline vertical incision. Fibroid uterus. Normal adnexa. Adhesions of anterior bladder to uterus. Frozen section with grade 1 cancer focally involving adenomyosis, minimal myometrial invasion.    11/17/2014 Imaging    Ct abdomen 1. Status  post hysterectomy. Pelvic edema which is greater than typically seen 3 weeks postop. Suspicious for postoperative infection. 2. Bladder wall thickening and irregularity. Although this could be partially due to underdistention, Concurrent cystitis cannot be excluded. 3. Bilateral fluid density lesions along the pelvic sidewalls. Favored to represent seromas or lymphangiomas. No specific features to suggest abscess. If the patient's symptoms persist after appropriate antibiotic therapy, aspiration of the largest left pelvic sidewall "Lesion" should be considered. 4. Suspicion of mild hepatic steatosis. Indeterminate right hepatic lobe lesion. If definitive characterization is desired in this patient with history of primary malignancy, nonemergent pre and post contrast abdominal MRI could be performed. 5. Hiatal hernia.    11/25/2014 Imaging    1. Persistent bilateral pelvic sidewall fluid collections, left greater than right, with left-sided inflammatory changes. 2. Technically successful 12 French left pelvic abscess drain catheter placement. A sample of the aspirate was sent for Gram stain, culture and sensitivity.    12/08/2014 Imaging    CT abdomen and pelvis 1. Complete resolution of dominant fluid collection within the left hemipelvis following percutaneous drainage catheter placement. This percutaneous drainage catheter was subsequent removed intact at the patient's bedside. 2. Continued decrease in size of bilobed fluid collections within the right hemipelvis with superficial component measuring 1.6 cm and posterior component measuring 2.6 cm, indeterminate though both favored to represent evolving seromas. 3. Unchanged indeterminate approximately 1.2 cm hepatic lesion. Further evaluation with contrast-enhanced abdominal MRI could be performed as clinically indicated. 4. Colonic diverticulosis without evidence of diverticulitis.    10/19/2016 Imaging    New 2.6 cm soft tissue mass involving  right vaginal cuff, consistent with locally recurrent carcinoma.  No other  sites of metastatic disease identified.  Colonic diverticulosis, without radiographic evidence of diverticulitis.  Mild hepatic steatosis.     10/24/2016 Relapse/Recurrence    Vaginal cuff recurrence. No evidence of distant disease.    10/24/2016 Pathology Results    Vagina, biopsy, right apex - ADENOCARCINOMA - SEE COMMENT Microscopic Comment The morphologic features are consistent with the patient's previously diagnosed endometrioid adenocarcinoma    11/08/2016 - 12/27/2016 Radiation Therapy    45 Gy in 25 fractions of 1.8 Gy. The residual pelvic mass was boosted to 14 Gy in 7 fractions of 2 Gy. No intracavitary lesion seen at completion of pelvic fields to boost with brachytherapy.    06/12/2017 Imaging    CT abdomen and pelvis 1. New 1.1 by 0.8 cm right lower lobe pulmonary nodule, concerning for pulmonary metastatic disease. Possibilities for further assessment include biopsy or nuclear medicine PET-CT to assess this lesion and the rest of the neck/chest/abdomen/pelvis for other potential hypermetabolic lesions. 2. The previous soft tissue mass along the right vaginal cuff is no longer present. 3. Stable and likely benign right hepatic lobe hypodense lesion. 4. Other imaging findings of potential clinical significance: Sigmoid colon diverticulosis. Aortic Atherosclerosis (ICD10-I70.0). Multilevel lumbar degenerative disc disease. Mild cardiomegaly. Small type 1 hiatal hernia.    07/05/2017 Imaging    Ct chest Several bilateral pulmonary nodules measuring up to 11 mm, highly suspicious for pulmonary metastases.   No evidence of lymphadenopathy or pleural effusion.  Aortic Atherosclerosis (ICD10-I70.0).    07/25/2017 Imaging    Status post CT-guided lung nodule biopsy. Tissue specimen sent to pathology for complete histopathologic analysis.    07/25/2017 Pathology Results    Lung, needle/core biopsy(ies),  RLL - METASTATIC ADENOCARCINOMA - SEE COMMENT Microscopic Comment By immunohistochemistry, the neoplastic cells are positive for Pax-8 and ER but negative for TTF-1. Overall, this immunoprofile is consistent with metastasis from the patient's known endometrioid adenocarcinoma    07/30/2017 Cancer Staging    Staging form: Corpus Uteri - Carcinoma and Carcinosarcoma, AJCC 8th Edition - Clinical: Stage IVB (rcT1, cN0, pM1) - Signed by Heath Lark, MD on 07/30/2017    08/09/2017 -  Chemotherapy    The patient had pembrolizumab for chemotherapy treatment.     10/10/2017 Imaging    1. Interval decrease in size and contour. The of previously described right and left lower lobe pulmonary nodules. 2. Interval increase in size of mediastinal and hilar adenopathy, potentially metastatic in etiology. 3. Aortic Atherosclerosis (ICD10-I70.0).    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      Metastasis to lung ([place])   07/30/2017 Initial Diagnosis    Metastasis to lung ([place])    08/02/2017 -  Chemotherapy    The patient had pembrolizumab (KEYTRUDA) 200 mg in sodium chloride 0.9 % 50 mL chemo infusion, 200 mg, Intravenous, Once, 5 of 7 cycles Administration: 200 mg (08/09/2017), 200 mg (08/30/2017), 200 mg (09/20/2017), 200 mg (10/11/2017), 200 mg (11/01/2017)  for chemotherapy treatment.      REVIEW  OF SYSTEMS:   Constitutional: Denies fevers, chills or abnormal weight loss Eyes: Denies blurriness of vision Ears, nose, mouth,  throat, and face: Denies mucositis or sore throat Respiratory: Denies cough, dyspnea or wheezes Cardiovascular: Denies palpitation, chest discomfort or lower extremity swelling Gastrointestinal:  Denies nausea, heartburn or change in bowel habits Skin: Denies abnormal skin rashes Lymphatics: Denies new lymphadenopathy or easy bruising Neurological:Denies numbness, tingling or new weaknesses Behavioral/Psych: Mood is stable, no new changes  All other systems were reviewed with the patient and are negative.  I have reviewed the past medical history, past surgical history, social history and family history with the patient and they are unchanged from previous note.  ALLERGIES:  is allergic to hydrocodone.  MEDICATIONS:  Current Outpatient Medications  Medication Sig Dispense Refill  . acetaminophen (TYLENOL) 325 MG tablet Take 650 mg by mouth every 6 (six) hours as needed for moderate pain.    . Calcium Carbonate-Vitamin D (CALCIUM-D PO) Take 1 tablet by mouth daily.    Marland Kitchen FLUoxetine (PROZAC) 40 MG capsule Take 40 mg by mouth daily.    Marland Kitchen HYDROmorphone (DILAUDID) 4 MG tablet Take 1 tablet (4 mg total) by mouth every 4 (four) hours as needed for severe pain. 30 tablet 0  . levothyroxine (SYNTHROID, LEVOTHROID) 100 MCG tablet Take 100 mcg by mouth daily before breakfast.     . pantoprazole (PROTONIX) 40 MG tablet Take 1 tablet (40 mg total) by mouth daily before breakfast. While taking naproxen. Any generic PPI is okay. 30 tablet 3  . predniSONE (DELTASONE) 20 MG tablet Take 1 tablet (20 mg total) by mouth daily with breakfast. 10 tablet 0  . traMADol (ULTRAM) 50 MG tablet Take 1 tablet (50 mg total) by mouth every 8 (eight) hours as needed for severe pain. 20 tablet 0   No current facility-administered medications for this visit.     PHYSICAL EXAMINATION: ECOG PERFORMANCE STATUS: 1 - Symptomatic but completely ambulatory  Vitals:   11/22/17 1221  BP: 123/64  Pulse: 82  Resp: 16  Temp:  98 F (36.7 C)  SpO2: 99%   Filed Weights   11/22/17 1221  Weight: 173 lb 1.6 oz (78.5 kg)    GENERAL:alert, no distress and comfortable SKIN: skin color, texture, turgor are normal, no rashes or significant lesions Musculoskeletal:no cyanosis of digits and no clubbing  NEURO: alert & oriented x 3 with fluent speech, no focal motor/sensory deficits  LABORATORY DATA:  I have reviewed the data as listed    Component Value Date/Time   NA 142 11/22/2017 1141   K 3.9 11/22/2017 1141   CL 110 11/22/2017 1141   CO2 23 11/22/2017 1141   GLUCOSE 152 (H) 11/22/2017 1141   BUN 18 11/22/2017 1141   BUN 18.7 10/17/2016 1353   CREATININE 0.85 11/22/2017 1141   CREATININE 0.8 10/17/2016 1353   CALCIUM 9.6 11/22/2017 1141   PROT 6.3 (L) 11/22/2017 1141   ALBUMIN 3.6 11/22/2017 1141   AST 12 (L) 11/22/2017 1141   ALT 14 11/22/2017 1141   ALKPHOS 50 11/22/2017 1141   BILITOT 0.4 11/22/2017 1141   GFRNONAA >60 11/22/2017 1141   GFRAA >60 11/22/2017 1141    No results found for: SPEP, UPEP  Lab Results  Component Value Date   WBC 4.4 11/22/2017   NEUTROABS 3.2 11/22/2017   HGB 12.2 11/22/2017   HCT 36.4 11/22/2017   MCV 89.7 11/22/2017   PLT 235 11/22/2017      Chemistry  Component Value Date/Time   NA 142 11/22/2017 1141   K 3.9 11/22/2017 1141   CL 110 11/22/2017 1141   CO2 23 11/22/2017 1141   BUN 18 11/22/2017 1141   BUN 18.7 10/17/2016 1353   CREATININE 0.85 11/22/2017 1141   CREATININE 0.8 10/17/2016 1353      Component Value Date/Time   CALCIUM 9.6 11/22/2017 1141   ALKPHOS 50 11/22/2017 1141   AST 12 (L) 11/22/2017 1141   ALT 14 11/22/2017 1141   BILITOT 0.4 11/22/2017 1141       All questions were answered. The patient knows to call the clinic with any problems, questions or concerns. No barriers to learning was detected.  I spent 20 minutes counseling the patient face to face. The total time spent in the appointment was 25 minutes and more than 50%  was on counseling and review of test results  Heath Lark, MD 11/23/2017 6:16 AM

## 2017-11-28 ENCOUNTER — Ambulatory Visit (HOSPITAL_COMMUNITY)
Admission: RE | Admit: 2017-11-28 | Discharge: 2017-11-28 | Disposition: A | Discharge status: Home / Self Care | Payer: 59 | Source: Ambulatory Visit | Attending: Hematology and Oncology | Admitting: Hematology and Oncology

## 2017-11-28 DIAGNOSIS — C541 Malignant neoplasm of endometrium: Secondary | ICD-10-CM | POA: Insufficient documentation

## 2017-11-28 DIAGNOSIS — C7800 Secondary malignant neoplasm of unspecified lung: Secondary | ICD-10-CM | POA: Diagnosis not present

## 2017-11-28 DIAGNOSIS — R52 Pain, unspecified: Secondary | ICD-10-CM

## 2017-11-28 MED ORDER — IOPAMIDOL (ISOVUE-300) INJECTION 61%
30.0000 mL | Freq: Once | INTRAVENOUS | Status: AC | PRN
  Administered 2017-11-28: 30 mL via ORAL

## 2017-11-28 MED ORDER — IOHEXOL 300 MG/ML  SOLN
100.0000 mL | Freq: Once | INTRAMUSCULAR | Status: AC | PRN
  Administered 2017-11-28: 100 mL via INTRAVENOUS

## 2017-11-28 MED ORDER — IOPAMIDOL (ISOVUE-300) INJECTION 61%
INTRAVENOUS | Status: AC
  Filled 2017-11-28: qty 30

## 2017-12-02 ENCOUNTER — Encounter: Payer: Self-pay | Admitting: Hematology and Oncology

## 2017-12-02 ENCOUNTER — Telehealth: Payer: Self-pay | Admitting: Hematology and Oncology

## 2017-12-02 ENCOUNTER — Ambulatory Visit
Admission: RE | Admit: 2017-12-02 | Discharge: 2017-12-02 | Disposition: A | Discharge status: Home / Self Care | Payer: 59 | Source: Ambulatory Visit | Attending: Radiation Oncology | Admitting: Radiation Oncology

## 2017-12-02 ENCOUNTER — Encounter: Payer: Self-pay | Admitting: Radiation Oncology

## 2017-12-02 ENCOUNTER — Other Ambulatory Visit: Payer: Self-pay

## 2017-12-02 ENCOUNTER — Inpatient Hospital Stay: Payer: 59 | Attending: Gynecologic Oncology | Admitting: Hematology and Oncology

## 2017-12-02 VITALS — BP 143/58 | HR 83 | Temp 98.7°F | Resp 16 | Ht 61.75 in | Wt 170.2 lb

## 2017-12-02 DIAGNOSIS — Z8542 Personal history of malignant neoplasm of other parts of uterus: Secondary | ICD-10-CM | POA: Insufficient documentation

## 2017-12-02 DIAGNOSIS — C541 Malignant neoplasm of endometrium: Secondary | ICD-10-CM

## 2017-12-02 DIAGNOSIS — Z885 Allergy status to narcotic agent status: Secondary | ICD-10-CM | POA: Diagnosis not present

## 2017-12-02 DIAGNOSIS — E039 Hypothyroidism, unspecified: Secondary | ICD-10-CM | POA: Diagnosis not present

## 2017-12-02 DIAGNOSIS — Z79899 Other long term (current) drug therapy: Secondary | ICD-10-CM | POA: Diagnosis not present

## 2017-12-02 DIAGNOSIS — R52 Pain, unspecified: Secondary | ICD-10-CM

## 2017-12-02 DIAGNOSIS — Z923 Personal history of irradiation: Secondary | ICD-10-CM | POA: Diagnosis not present

## 2017-12-02 DIAGNOSIS — Z08 Encounter for follow-up examination after completed treatment for malignant neoplasm: Secondary | ICD-10-CM | POA: Diagnosis not present

## 2017-12-02 DIAGNOSIS — Z90710 Acquired absence of both cervix and uterus: Secondary | ICD-10-CM | POA: Diagnosis not present

## 2017-12-02 DIAGNOSIS — E038 Other specified hypothyroidism: Secondary | ICD-10-CM | POA: Diagnosis not present

## 2017-12-02 DIAGNOSIS — Z9221 Personal history of antineoplastic chemotherapy: Secondary | ICD-10-CM | POA: Insufficient documentation

## 2017-12-02 DIAGNOSIS — Z90722 Acquired absence of ovaries, bilateral: Secondary | ICD-10-CM

## 2017-12-02 DIAGNOSIS — C7800 Secondary malignant neoplasm of unspecified lung: Secondary | ICD-10-CM

## 2017-12-02 NOTE — Progress Notes (Signed)
Pt presents today for f/u with Dr. Sondra Come. Pt denies c/o pain. Pt denies dysuria/hematuria. Pt denies vaginal discharge/bleeding. Pt denies rectal bleeding, diarrhea/constipation. Pt denies N/V. Pt reports abdominal bloating. Pt is very tearful and states "its not a good day". Pt declines SW consult.   BP (!) 143/58 (BP Location: Left Arm, Patient Position: Sitting)   Pulse 83   Temp 98.7 F (37.1 C) (Oral)   Resp 16   Ht 5' 1.75" (1.568 m)   Wt 170 lb 3.2 oz (77.2 kg)   SpO2 98%   BMI 31.38 kg/m   Wt Readings from Last 3 Encounters:  12/02/17 170 lb 3.2 oz (77.2 kg)  11/22/17 173 lb 1.6 oz (78.5 kg)  11/08/17 104 lb (47.2 kg)   Loma Sousa, RN BSN

## 2017-12-02 NOTE — Telephone Encounter (Signed)
Gave patient avs and calendar for lab appt.  Patient preferred to get contrast from Radiology.

## 2017-12-02 NOTE — Telephone Encounter (Signed)
Gave patient avs and calendar.   °

## 2017-12-02 NOTE — Progress Notes (Signed)
Radiation Oncology         ([phone]) [phone] ________________________________  Name: Ashley Villegas MRN: [id number]  Date: 12/02/2017  DOB: 06/11/1950  Follow-Up Visit Note  CC: [name], MD  [name], MD  No diagnosis found.  Diagnosis:  67 y.o.woman with recurrent endometrial cancer, stage IA grade 1, status post hysterectomy.     Interval Since Last Radiation:  11 months 11/08/2016-12/27/2016  1. The pelvis was treated to 45 Gy in 25 fractions of 1.8 Gy. 2. The residual pelvic mass was boosted to 14 Gy in 7 fractions of 2 Gy. (No intracavitary lesion seen at completion of pelvic fields to boost with brachytherapy)  Narrative:  The patient returns today for routine follow-up. She is doing well overall. She denies any new issues since completion of her radiation treatment. She notes that she was in the hospital several weeks ago due to pain with her recent [place] administration. She will meet with Dr. Alvy Bimler later today.   Since her last visit to the office, she underwent a CT CAP w contrast on 11/28/2017 that showed: Chest Impression: No thoracic metastasis. No discrete pulmonary nodules identified. Abdomen / Pelvis Impression: No evidence of local recurrence or metastatic endometrial carcinoma in the abdomen pelvis.   She also had pathology completed on 07/25/2017 that showed: Lung, needle/core biopsy(ies), RLL with metastatic adenocarcinoma. By immunohistochemistry, the neoplastic cells are positive for Pax-8 and ER but negative for TTF-1.  She had genetic testing on 08/08/2017 that showed: Negative result. No Pathogenic sequence variants or deletions/duplications Identified.  On review of systems, she reports intermittent groin pain (with rare occurrences 2-3 weeks apart). she denies vaginal bleeding, rectal bleeding, and any other symptoms. Pertinent positives are listed and detailed within the above HPI.                      ALLERGIES:  is allergic to  hydrocodone.  Meds: Current Outpatient Medications  Medication Sig Dispense Refill  . acetaminophen (TYLENOL) 325 MG tablet Take 650 mg by mouth every 6 (six) hours as needed for moderate pain.    . Calcium Carbonate-Vitamin D (CALCIUM-D PO) Take 1 tablet by mouth daily.    Marland Kitchen FLUoxetine (PROZAC) 40 MG capsule Take 40 mg by mouth daily.    Marland Kitchen HYDROmorphone (DILAUDID) 4 MG tablet Take 1 tablet (4 mg total) by mouth every 4 (four) hours as needed for severe pain. 30 tablet 0  . levothyroxine (SYNTHROID, LEVOTHROID) 100 MCG tablet Take 100 mcg by mouth daily before breakfast.     . pantoprazole (PROTONIX) 40 MG tablet Take 1 tablet (40 mg total) by mouth daily before breakfast. While taking naproxen. Any generic PPI is okay. 30 tablet 3  . predniSONE (DELTASONE) 20 MG tablet Take 1 tablet (20 mg total) by mouth daily with breakfast. 10 tablet 0  . traMADol (ULTRAM) 50 MG tablet Take 1 tablet (50 mg total) by mouth every 8 (eight) hours as needed for severe pain. 20 tablet 0   No current facility-administered medications for this encounter.     Physical Findings: The patient is in no acute distress. Patient is alert and oriented.  height is 5' 1.75" (1.568 m) and weight is 170 lb 3.2 oz (77.2 kg). Her oral temperature is 98.7 F (37.1 C). Her blood pressure is 143/58 (abnormal) and her pulse is 83. Her respiration is 16 and oxygen saturation is 98%. . Lungs are clear to auscultation bilaterally. Heart has regular rate and rhythm. No  palpable cervical, supraclavicular, or axillary adenopathy. Abdomen soft, non-tender, normal bowel sounds.  On pelvic examination the external genitalia were unremarkable. A speculum exam was performed. There are no mucosal lesions noted in the vaginal vault. On bimanual and rectovaginal examination there were no pelvic masses appreciated. TTP of left pelvis region, but no palpable mass.      Lab Findings: Lab Results  Component Value Date   WBC 4.4 11/22/2017    HGB 12.2 11/22/2017   HCT 36.4 11/22/2017   MCV 89.7 11/22/2017   PLT 235 11/22/2017    Radiographic Findings: Ct Chest W Contrast  Result Date: 11/29/2017 CLINICAL DATA:  Endometrial carcinoma.  Chemotherapy ongoing. EXAM: CT CHEST, ABDOMEN, AND PELVIS WITH CONTRAST TECHNIQUE: Multidetector CT imaging of the chest, abdomen and pelvis was performed following the standard protocol during bolus administration of intravenous contrast. CONTRAST:  42mL ISOVUE-300 IOPAMIDOL (ISOVUE-300) INJECTION 61%, 164mL OMNIPAQUE IOHEXOL 300 MG/ML SOLN COMPARISON:  10/09/2017 FINDINGS: CT CHEST FINDINGS Cardiovascular: No significant vascular findings. Normal heart size. No pericardial effusion. Mediastinum/Nodes: No axillary supraclavicular adenopathy. No mediastinal hilar adenopathy. No pericardial effusion. Esophagus normal. Lungs/Pleura: No suspicious pulmonary nodules.  Airways normal Musculoskeletal: No aggressive osseous lesion. CT ABDOMEN AND PELVIS FINDINGS Hepatobiliary: Low-density lesion in the RIGHT hepatic lobe has simple fluid attenuation, unchanged from prior, and most consistent with benign cysts. No new hepatic lesion. Pancreas: Pancreas is normal. No ductal dilatation. No pancreatic inflammation. Spleen: Normal spleen Adrenals/urinary tract: Adrenal glands and kidneys are normal. The ureters and bladder normal. Stomach/Bowel: Stomach, small bowel, appendix, and cecum are normal. The colon and rectosigmoid colon are normal. Vascular/Lymphatic: Abdominal aorta is normal caliber. There is no retroperitoneal or periportal lymphadenopathy. No pelvic lymphadenopathy. Reproductive: Post hysterectomy anatomy. No measurable abnormality along the vaginal cuff. Other: No peritoneal disease.  No free fluid Musculoskeletal: No aggressive osseous lesion. IMPRESSION: Chest Impression: No thoracic metastasis.  No discrete pulmonary nodules identified. Abdomen / Pelvis Impression: No evidence of local recurrence or  metastatic endometrial carcinoma in the abdomen pelvis. Electronically Signed   By: Suzy Bouchard M.D.   On: 11/29/2017 11:00   Ct Abdomen Pelvis W Contrast  Result Date: 11/29/2017 CLINICAL DATA:  Endometrial carcinoma.  Chemotherapy ongoing. EXAM: CT CHEST, ABDOMEN, AND PELVIS WITH CONTRAST TECHNIQUE: Multidetector CT imaging of the chest, abdomen and pelvis was performed following the standard protocol during bolus administration of intravenous contrast. CONTRAST:  76mL ISOVUE-300 IOPAMIDOL (ISOVUE-300) INJECTION 61%, 180mL OMNIPAQUE IOHEXOL 300 MG/ML SOLN COMPARISON:  10/09/2017 FINDINGS: CT CHEST FINDINGS Cardiovascular: No significant vascular findings. Normal heart size. No pericardial effusion. Mediastinum/Nodes: No axillary supraclavicular adenopathy. No mediastinal hilar adenopathy. No pericardial effusion. Esophagus normal. Lungs/Pleura: No suspicious pulmonary nodules.  Airways normal Musculoskeletal: No aggressive osseous lesion. CT ABDOMEN AND PELVIS FINDINGS Hepatobiliary: Low-density lesion in the RIGHT hepatic lobe has simple fluid attenuation, unchanged from prior, and most consistent with benign cysts. No new hepatic lesion. Pancreas: Pancreas is normal. No ductal dilatation. No pancreatic inflammation. Spleen: Normal spleen Adrenals/urinary tract: Adrenal glands and kidneys are normal. The ureters and bladder normal. Stomach/Bowel: Stomach, small bowel, appendix, and cecum are normal. The colon and rectosigmoid colon are normal. Vascular/Lymphatic: Abdominal aorta is normal caliber. There is no retroperitoneal or periportal lymphadenopathy. No pelvic lymphadenopathy. Reproductive: Post hysterectomy anatomy. No measurable abnormality along the vaginal cuff. Other: No peritoneal disease.  No free fluid Musculoskeletal: No aggressive osseous lesion. IMPRESSION: Chest Impression: No thoracic metastasis.  No discrete pulmonary nodules identified. Abdomen / Pelvis Impression: No evidence of  local  recurrence or metastatic endometrial carcinoma in the abdomen pelvis. Electronically Signed   By: Suzy Bouchard M.D.   On: 11/29/2017 11:00    Impression:  No evidence of recurrence on clinical exam today. Recent CT scan of CAP shows no active disease after her immunotherapy.     Plan:  Patient will see Dr. Alvy Bimler later today. Routine follow up with Dr. Gerarda Fraction in December and Radiation Oncology March of 2020.    ____________________________________ -----------------------------------  Blair Promise, PhD, MD   This document serves as a record of services personally performed by Gery Pray, MD. It was created on his behalf by [facility], a trained medical scribe. The creation of this record is based on the scribe's personal observations and the provider's statements to them. This document has been checked and approved by the attending provider.

## 2017-12-02 NOTE — Assessment & Plan Note (Signed)
I have reviewed CT scan of the chest, abdomen and pelvis with the patient and family She has no signs of cancer recurrence The patient has complete response with recent immunotherapy She has recovered from all the side effects of treatment We discussed the risks, benefits, side effects of continuing treatment in the maintenance fashion versus antiestrogen therapy versus chemo holiday The patient favors chemo holiday I plan to repeat CT imaging in 3 months before her appointment to see GYN oncologist.  She is educated to watch out for signs and symptoms of cancer recurrence

## 2017-12-02 NOTE — Progress Notes (Signed)
[place] OFFICE PROGRESS NOTE  Patient Care Team: [name], MD as PCP - General (Internal Medicine) Jacelyn Pi, MD as Consulting Physician (Endocrinology) Heath Lark, MD as Consulting Physician (Hematology and Oncology)  ASSESSMENT & PLAN:  Endometrial cancer [facility]) I have reviewed CT scan of the chest, abdomen and pelvis with the patient and family She has no signs of cancer recurrence The patient has complete response with recent immunotherapy She has recovered from all the side effects of treatment We discussed the risks, benefits, side effects of continuing treatment in the maintenance fashion versus antiestrogen therapy versus chemo holiday The patient favors chemo holiday I plan to repeat CT imaging in 3 months before her appointment to see GYN oncologist.  She is educated to watch out for signs and symptoms of cancer recurrence   Hypothyroidism Her recent TSH level was only mildly elevated at 4.4 She will continue close follow-up with her endocrinologist  Total body pain Diffuse joint pain has resolved Her symptoms were most consistent with arthritis related to side effects of checkpoint inhibitor which has subsequently resolved with conservative management.   Orders Placed This Encounter  Procedures  . CT ABDOMEN PELVIS W CONTRAST    Standing Status:   Future    Standing Expiration Date:   12/03/2018    Order Specific Question:   If indicated for the ordered procedure, I authorize the administration of contrast media per Radiology protocol    Answer:   Yes    Order Specific Question:   Preferred imaging location?    Answer:   [facility]    Order Specific Question:   Radiology Contrast Protocol - do NOT remove file path    Answer:   \\[email or web address]  . CT CHEST W CONTRAST    Standing Status:   Future    Standing Expiration Date:   12/03/2018    Order Specific Question:   If indicated for the ordered  procedure, I authorize the administration of contrast media per Radiology protocol    Answer:   Yes    Order Specific Question:   Preferred imaging location?    Answer:   [facility]    Order Specific Question:   Radiology Contrast Protocol - do NOT remove file path    Answer:   \\[email or web address]    INTERVAL HISTORY: Please see below for problem oriented charting. She returns with family members for further follow-up She feels well Joint pain has resolved She denies recent skin rash  SUMMARY OF ONCOLOGIC HISTORY: Oncology History   Abnormal MSI, germline mutation was negative Endometrioid     Endometrial cancer ([place])   09/15/2014 Pathology Results    Endometrium, curettage - ENDOMETRIAL ADENOCARCINOMA. - SEE COMMENT. Microscopic Comment Although definitive characterization is best performed on resection specimen, as sampled, the endometrial adenocarcinoma appears to be endometrioid subtype, FIGO grade 1.     09/15/2014 Surgery    PreOp: postmenopausal bleeding, cervical stenosis PostOp: same and uterine polyp Procedure:  Hysteroscopy, Dilation and Curettage, Myosure polypectomy Surgeon: Dr. Janyth Pupa  Findings:8cm uterus with thickened polypoid-like endometrium, large 2cm irregular appearing polyp  Specimens: 1) endometrial curettings    10/26/2014 Pathology Results    1. Uterus +/- tubes/ovaries, neoplastic - INVASIVE ADENOCARCINOMA, ENDOMETRIOID TYPE, SEE COMMENT. - TUMOR INVOLVES LESS THAN ONE HALF MYOMETRIAL THICKNESS. - TUMOR INVOLVES UTERINE ADENOMYOSIS. - BENIGN LEIOMYOMATA (UP TO 2.5 CM). - BENIGN CERVICAL MUCOSA; NEGATIVE FOR INTRAEPITHELIAL LESION OR MALIGNANCY. - BENIGN RIGHT AND LEFT OVARIES; NEGATIVE FOR  ATYPIA OR MALIGNANCY. - BENIGN RIGHT AND LEFT FALLOPIAN TUBE; NEGATIVE FOR ATYPIA OR MALIGNANCY. - BENIGN PARATUBAL CYSTS; NEGATIVE FOR ATYPIA OR MALIGNANCY. - SEE TUMOR SYNOPTIC TEMPLATE BELOW. 2. Lymph nodes,  regional resection, right pelvic - FOUR LYMPH NODES, NEGATIVE FOR TUMOR (0/4). 3. Lymph nodes, regional resection, left pelvic - FOUR LYMPH NODES, NEGATIVE FOR TUMOR (0/4). Microscopic Comment 1. ONCOLOGY TABLE-UTERUS, CARCINOMA OR CARCINOSARCOMA Specimen: Uterus and bilateral fallopian tubes and ovaries. Procedure: Hysterectomy and bilateral salpingo-oophorectomy. Lymph node sampling performed: Yes. Specimen integrity: Intact. Maximum tumor size: 4.0 cm (tumor involved entire endometrium) Histologic type: Adenocarcinoma, endometrioid type. Grade: 1 Myometrial invasion: 0.5 cm where myometrium is 1.5 cm in thickness Cervical stromal involvement: Absent. Extent of involvement of other organs: None Lymph - vascular invasion: Absent. Peritoneal washings: N/A Lymph nodes: # examined - 8 ; # positive - 0 Pelvic lymph nodes: N/A involved of N/A lymph nodes. Para-aortic lymph nodes: N/A involved of N/A lymph nodes. Other (specify involvement and site): N/A TNM code: pT1a, pN0 FIGO Stage (based on pathologic findings, needs clinical correlation): N/A Comments: None MSI testing abnormal    10/26/2014 Surgery    Surgery: Total robotic hysterectomy, bilateral salpingo-oophorectomy, bilateral pelvic lymph node dissection.   Surgeons:  Lucita Lora. Alycia Rossetti, MD; Lahoma Crocker, MD   Pathology: Uterus, cervix, bilateral tubes and ovaries, bilateral pelvic lymph nodes to pathology.   Operative findings: Omentum adherent to midline vertical incision. Fibroid uterus. Normal adnexa. Adhesions of anterior bladder to uterus. Frozen section with grade 1 cancer focally involving adenomyosis, minimal myometrial invasion.    11/17/2014 Imaging    Ct abdomen 1. Status post hysterectomy. Pelvic edema which is greater than typically seen 3 weeks postop. Suspicious for postoperative infection. 2. Bladder wall thickening and irregularity. Although this could be partially due to underdistention, Concurrent  cystitis cannot be excluded. 3. Bilateral fluid density lesions along the pelvic sidewalls. Favored to represent seromas or lymphangiomas. No specific features to suggest abscess. If the patient's symptoms persist after appropriate antibiotic therapy, aspiration of the largest left pelvic sidewall "Lesion" should be considered. 4. Suspicion of mild hepatic steatosis. Indeterminate right hepatic lobe lesion. If definitive characterization is desired in this patient with history of primary malignancy, nonemergent pre and post contrast abdominal MRI could be performed. 5. Hiatal hernia.    11/25/2014 Imaging    1. Persistent bilateral pelvic sidewall fluid collections, left greater than right, with left-sided inflammatory changes. 2. Technically successful 12 French left pelvic abscess drain catheter placement. A sample of the aspirate was sent for Gram stain, culture and sensitivity.    12/08/2014 Imaging    CT abdomen and pelvis 1. Complete resolution of dominant fluid collection within the left hemipelvis following percutaneous drainage catheter placement. This percutaneous drainage catheter was subsequent removed intact at the patient's bedside. 2. Continued decrease in size of bilobed fluid collections within the right hemipelvis with superficial component measuring 1.6 cm and posterior component measuring 2.6 cm, indeterminate though both favored to represent evolving seromas. 3. Unchanged indeterminate approximately 1.2 cm hepatic lesion. Further evaluation with contrast-enhanced abdominal MRI could be performed as clinically indicated. 4. Colonic diverticulosis without evidence of diverticulitis.    10/19/2016 Imaging    New 2.6 cm soft tissue mass involving right vaginal cuff, consistent with locally recurrent carcinoma.  No other sites of metastatic disease identified.  Colonic diverticulosis, without radiographic evidence of diverticulitis.  Mild hepatic steatosis.     10/24/2016  Relapse/Recurrence    Vaginal cuff recurrence. No evidence of  distant disease.    10/24/2016 Pathology Results    Vagina, biopsy, right apex - ADENOCARCINOMA - SEE COMMENT Microscopic Comment The morphologic features are consistent with the patient's previously diagnosed endometrioid adenocarcinoma    11/08/2016 - 12/27/2016 Radiation Therapy    45 Gy in 25 fractions of 1.8 Gy. The residual pelvic mass was boosted to 14 Gy in 7 fractions of 2 Gy. No intracavitary lesion seen at completion of pelvic fields to boost with brachytherapy.    06/12/2017 Imaging    CT abdomen and pelvis 1. New 1.1 by 0.8 cm right lower lobe pulmonary nodule, concerning for pulmonary metastatic disease. Possibilities for further assessment include biopsy or nuclear medicine PET-CT to assess this lesion and the rest of the neck/chest/abdomen/pelvis for other potential hypermetabolic lesions. 2. The previous soft tissue mass along the right vaginal cuff is no longer present. 3. Stable and likely benign right hepatic lobe hypodense lesion. 4. Other imaging findings of potential clinical significance: Sigmoid colon diverticulosis. Aortic Atherosclerosis (ICD10-I70.0). Multilevel lumbar degenerative disc disease. Mild cardiomegaly. Small type 1 hiatal hernia.    07/05/2017 Imaging    Ct chest Several bilateral pulmonary nodules measuring up to 11 mm, highly suspicious for pulmonary metastases.   No evidence of lymphadenopathy or pleural effusion.  Aortic Atherosclerosis (ICD10-I70.0).    07/25/2017 Imaging    Status post CT-guided lung nodule biopsy. Tissue specimen sent to pathology for complete histopathologic analysis.    07/25/2017 Pathology Results    Lung, needle/core biopsy(ies), RLL - METASTATIC ADENOCARCINOMA - SEE COMMENT Microscopic Comment By immunohistochemistry, the neoplastic cells are positive for Pax-8 and ER but negative for TTF-1. Overall, this immunoprofile is consistent with metastasis from the  patient's known endometrioid adenocarcinoma    07/30/2017 Cancer Staging    Staging form: Corpus Uteri - Carcinoma and Carcinosarcoma, AJCC 8th Edition - Clinical: Stage IVB (rcT1, cN0, pM1) - Signed by Heath Lark, MD on 07/30/2017    08/09/2017 -  Chemotherapy    The patient had pembrolizumab for chemotherapy treatment.     10/10/2017 Imaging    1. Interval decrease in size and contour. The of previously described right and left lower lobe pulmonary nodules. 2. Interval increase in size of mediastinal and hilar adenopathy, potentially metastatic in etiology. 3. Aortic Atherosclerosis (ICD10-I70.0).    11/08/2017 - 11/10/2017 Hospital Admission    She was admitted to the hospital due to uncontrolled pain    11/29/2017 Imaging    Chest Impression:  No thoracic metastasis. No discrete pulmonary nodules identified.  Abdomen / Pelvis Impression:  No evidence of local recurrence or metastatic endometrial carcinoma in the abdomen pelvis.     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      Metastasis to lung ([place])   07/30/2017 Initial Diagnosis    Metastasis to lung ([place])    08/02/2017 -  Chemotherapy    The patient had pembrolizumab (KEYTRUDA) 200 mg in sodium chloride 0.9 % 50 mL chemo infusion, 200 mg, Intravenous, Once, 5 of 7 cycles Administration: 200 mg (08/09/2017), 200 mg (08/30/2017), 200 mg (09/20/2017), 200 mg (10/11/2017), 200 mg (11/01/2017)  for chemotherapy treatment.  REVIEW OF SYSTEMS:   Constitutional: Denies fevers, chills or abnormal weight loss Eyes: Denies blurriness of vision Ears, nose, mouth,  throat, and face: Denies mucositis or sore throat Respiratory: Denies cough, dyspnea or wheezes Cardiovascular: Denies palpitation, chest discomfort or lower extremity swelling Gastrointestinal:  Denies nausea, heartburn or change in bowel habits Skin: Denies abnormal skin rashes Lymphatics: Denies new lymphadenopathy or easy bruising Neurological:Denies numbness, tingling or new weaknesses Behavioral/Psych: Mood is stable, no new changes  All other systems were reviewed with the patient and are negative.  I have reviewed the past medical history, past surgical history, social history and family history with the patient and they are unchanged from previous note.  ALLERGIES:  is allergic to hydrocodone.  MEDICATIONS:  Current Outpatient Medications  Medication Sig Dispense Refill  . acetaminophen (TYLENOL) 325 MG tablet Take 650 mg by mouth every 6 (six) hours as needed for moderate pain.    . Calcium Carbonate-Vitamin D (CALCIUM-D PO) Take 1 tablet by mouth daily.    Marland Kitchen FLUoxetine (PROZAC) 40 MG capsule Take 40 mg by mouth daily.    Marland Kitchen levothyroxine (SYNTHROID, LEVOTHROID) 100 MCG tablet Take 100 mcg by mouth daily before breakfast.     . pantoprazole (PROTONIX) 40 MG tablet Take 1 tablet (40 mg total) by mouth daily before breakfast. While taking naproxen. Any generic PPI is okay. 30 tablet 3  . traMADol (ULTRAM) 50 MG tablet Take 1 tablet (50 mg total) by mouth every 8 (eight) hours as needed for severe pain. 20 tablet 0   No current facility-administered medications for this visit.     PHYSICAL EXAMINATION: ECOG PERFORMANCE STATUS: 0 - Asymptomatic GENERAL:alert, no distress and comfortable SKIN: skin color, texture, turgor are normal, no rashes or significant lesions NEURO: alert & oriented x 3 with fluent speech, no focal motor/sensory deficits  LABORATORY DATA:  I have reviewed the data as listed    Component Value Date/Time   NA 142 11/22/2017 1141   K 3.9 11/22/2017 1141    CL 110 11/22/2017 1141   CO2 23 11/22/2017 1141   GLUCOSE 152 (H) 11/22/2017 1141   BUN 18 11/22/2017 1141   BUN 18.7 10/17/2016 1353   CREATININE 0.85 11/22/2017 1141   CREATININE 0.8 10/17/2016 1353   CALCIUM 9.6 11/22/2017 1141   PROT 6.3 (L) 11/22/2017 1141   ALBUMIN 3.6 11/22/2017 1141   AST 12 (L) 11/22/2017 1141   ALT 14 11/22/2017 1141   ALKPHOS 50 11/22/2017 1141   BILITOT 0.4 11/22/2017 1141   GFRNONAA >60 11/22/2017 1141   GFRAA >60 11/22/2017 1141    No results found for: SPEP, UPEP  Lab Results  Component Value Date   WBC 4.4 11/22/2017   NEUTROABS 3.2 11/22/2017   HGB 12.2 11/22/2017   HCT 36.4 11/22/2017   MCV 89.7 11/22/2017   PLT 235 11/22/2017      Chemistry      Component Value Date/Time   NA 142 11/22/2017 1141   K 3.9 11/22/2017 1141   CL 110 11/22/2017 1141   CO2 23 11/22/2017 1141   BUN 18 11/22/2017 1141   BUN 18.7 10/17/2016 1353   CREATININE 0.85 11/22/2017 1141   CREATININE 0.8 10/17/2016 1353      Component Value Date/Time   CALCIUM 9.6 11/22/2017 1141   ALKPHOS 50 11/22/2017 1141   AST 12 (L) 11/22/2017 1141   ALT 14 11/22/2017 1141   BILITOT 0.4 11/22/2017 1141       RADIOGRAPHIC STUDIES: I  have reviewed imaging study with the patient and family I have personally reviewed the radiological images as listed and agreed with the findings in the report. Ct Chest W Contrast  Result Date: 11/29/2017 CLINICAL DATA:  Endometrial carcinoma.  Chemotherapy ongoing. EXAM: CT CHEST, ABDOMEN, AND PELVIS WITH CONTRAST TECHNIQUE: Multidetector CT imaging of the chest, abdomen and pelvis was performed following the standard protocol during bolus administration of intravenous contrast. CONTRAST:  55m ISOVUE-300 IOPAMIDOL (ISOVUE-300) INJECTION 61%, 1045mOMNIPAQUE IOHEXOL 300 MG/ML SOLN COMPARISON:  10/09/2017 FINDINGS: CT CHEST FINDINGS Cardiovascular: No significant vascular findings. Normal heart size. No pericardial effusion.  Mediastinum/Nodes: No axillary supraclavicular adenopathy. No mediastinal hilar adenopathy. No pericardial effusion. Esophagus normal. Lungs/Pleura: No suspicious pulmonary nodules.  Airways normal Musculoskeletal: No aggressive osseous lesion. CT ABDOMEN AND PELVIS FINDINGS Hepatobiliary: Low-density lesion in the RIGHT hepatic lobe has simple fluid attenuation, unchanged from prior, and most consistent with benign cysts. No new hepatic lesion. Pancreas: Pancreas is normal. No ductal dilatation. No pancreatic inflammation. Spleen: Normal spleen Adrenals/urinary tract: Adrenal glands and kidneys are normal. The ureters and bladder normal. Stomach/Bowel: Stomach, small bowel, appendix, and cecum are normal. The colon and rectosigmoid colon are normal. Vascular/Lymphatic: Abdominal aorta is normal caliber. There is no retroperitoneal or periportal lymphadenopathy. No pelvic lymphadenopathy. Reproductive: Post hysterectomy anatomy. No measurable abnormality along the vaginal cuff. Other: No peritoneal disease.  No free fluid Musculoskeletal: No aggressive osseous lesion. IMPRESSION: Chest Impression: No thoracic metastasis.  No discrete pulmonary nodules identified. Abdomen / Pelvis Impression: No evidence of local recurrence or metastatic endometrial carcinoma in the abdomen pelvis. Electronically Signed   By: StSuzy Bouchard.D.   On: 11/29/2017 11:00   Ct Abdomen Pelvis W Contrast  Result Date: 11/29/2017 CLINICAL DATA:  Endometrial carcinoma.  Chemotherapy ongoing. EXAM: CT CHEST, ABDOMEN, AND PELVIS WITH CONTRAST TECHNIQUE: Multidetector CT imaging of the chest, abdomen and pelvis was performed following the standard protocol during bolus administration of intravenous contrast. CONTRAST:  3033mSOVUE-300 IOPAMIDOL (ISOVUE-300) INJECTION 61%, 100m40mNIPAQUE IOHEXOL 300 MG/ML SOLN COMPARISON:  10/09/2017 FINDINGS: CT CHEST FINDINGS Cardiovascular: No significant vascular findings. Normal heart size. No  pericardial effusion. Mediastinum/Nodes: No axillary supraclavicular adenopathy. No mediastinal hilar adenopathy. No pericardial effusion. Esophagus normal. Lungs/Pleura: No suspicious pulmonary nodules.  Airways normal Musculoskeletal: No aggressive osseous lesion. CT ABDOMEN AND PELVIS FINDINGS Hepatobiliary: Low-density lesion in the RIGHT hepatic lobe has simple fluid attenuation, unchanged from prior, and most consistent with benign cysts. No new hepatic lesion. Pancreas: Pancreas is normal. No ductal dilatation. No pancreatic inflammation. Spleen: Normal spleen Adrenals/urinary tract: Adrenal glands and kidneys are normal. The ureters and bladder normal. Stomach/Bowel: Stomach, small bowel, appendix, and cecum are normal. The colon and rectosigmoid colon are normal. Vascular/Lymphatic: Abdominal aorta is normal caliber. There is no retroperitoneal or periportal lymphadenopathy. No pelvic lymphadenopathy. Reproductive: Post hysterectomy anatomy. No measurable abnormality along the vaginal cuff. Other: No peritoneal disease.  No free fluid Musculoskeletal: No aggressive osseous lesion. IMPRESSION: Chest Impression: No thoracic metastasis.  No discrete pulmonary nodules identified. Abdomen / Pelvis Impression: No evidence of local recurrence or metastatic endometrial carcinoma in the abdomen pelvis. Electronically Signed   By: StewSuzy Bouchard.   On: 11/29/2017 11:00    All questions were answered. The patient knows to call the clinic with any problems, questions or concerns. No barriers to learning was detected.  I spent 15 minutes counseling the patient face to face. The total time spent in the appointment was 20 minutes and more  than 50% was on counseling and review of test results  Heath Lark, MD 12/02/2017 2:55 PM

## 2017-12-02 NOTE — Assessment & Plan Note (Signed)
Diffuse joint pain has resolved Her symptoms were most consistent with arthritis related to side effects of checkpoint inhibitor which has subsequently resolved with conservative management.

## 2017-12-02 NOTE — Assessment & Plan Note (Signed)
Her recent TSH level was only mildly elevated at 4.4 She will continue close follow-up with her endocrinologist

## 2017-12-31 ENCOUNTER — Telehealth: Payer: Self-pay

## 2017-12-31 NOTE — Telephone Encounter (Signed)
Called and given below message. She verbalized understanding. She would like Tramadol Rx sent to [facility]. She would like appt in 1-2 weeks.

## 2017-12-31 NOTE — Telephone Encounter (Signed)
She called and left a message to call her.  Called back. She is still having pain, numbness and tingling to hands and arms. She saw a hand specialist on 9/24, she took prednisone for 7 days. She is requesting something for pain. [facility] uses [facility] on [address].

## 2017-12-31 NOTE — Telephone Encounter (Signed)
Is she willing to try tramadol for pain? Can cause risk of constipation and sedation If so, let me know She will need to be seen in 1-2 weeks to see if it helps

## 2018-01-01 ENCOUNTER — Telehealth: Payer: Self-pay | Admitting: Hematology and Oncology

## 2018-01-01 ENCOUNTER — Other Ambulatory Visit: Payer: Self-pay | Admitting: Hematology and Oncology

## 2018-01-01 MED ORDER — TRAMADOL HCL 50 MG PO TABS: 50.0000 mg | ORAL_TABLET | Freq: Three times a day (TID) | ORAL | 0 refills | Status: DC | PRN

## 2018-01-01 NOTE — Telephone Encounter (Signed)
Done scehduling msg also sent

## 2018-01-01 NOTE — Telephone Encounter (Signed)
Called and given below message. She verbalized understanding. 

## 2018-01-01 NOTE — Telephone Encounter (Signed)
Called regarding 10/24

## 2018-01-16 ENCOUNTER — Inpatient Hospital Stay (HOSPITAL_BASED_OUTPATIENT_CLINIC_OR_DEPARTMENT_OTHER): Payer: 59 | Admitting: Hematology and Oncology

## 2018-01-16 ENCOUNTER — Inpatient Hospital Stay: Payer: 59 | Attending: Gynecologic Oncology

## 2018-01-16 ENCOUNTER — Encounter: Payer: Self-pay | Admitting: Hematology and Oncology

## 2018-01-16 ENCOUNTER — Telehealth: Payer: Self-pay

## 2018-01-16 VITALS — BP 119/84 | HR 71 | Temp 98.0°F | Resp 18 | Ht 61.75 in | Wt 178.0 lb

## 2018-01-16 DIAGNOSIS — C541 Malignant neoplasm of endometrium: Secondary | ICD-10-CM

## 2018-01-16 DIAGNOSIS — M13849 Other specified arthritis, unspecified hand: Secondary | ICD-10-CM | POA: Insufficient documentation

## 2018-01-16 DIAGNOSIS — E038 Other specified hypothyroidism: Secondary | ICD-10-CM

## 2018-01-16 DIAGNOSIS — C7800 Secondary malignant neoplasm of unspecified lung: Secondary | ICD-10-CM | POA: Insufficient documentation

## 2018-01-16 DIAGNOSIS — R52 Pain, unspecified: Secondary | ICD-10-CM

## 2018-01-16 DIAGNOSIS — G5603 Carpal tunnel syndrome, bilateral upper limbs: Secondary | ICD-10-CM | POA: Diagnosis not present

## 2018-01-16 DIAGNOSIS — Z23 Encounter for immunization: Secondary | ICD-10-CM | POA: Diagnosis not present

## 2018-01-16 DIAGNOSIS — E039 Hypothyroidism, unspecified: Secondary | ICD-10-CM | POA: Diagnosis not present

## 2018-01-16 DIAGNOSIS — M13879 Other specified arthritis, unspecified ankle and foot: Secondary | ICD-10-CM | POA: Insufficient documentation

## 2018-01-16 DIAGNOSIS — G629 Polyneuropathy, unspecified: Secondary | ICD-10-CM

## 2018-01-16 LAB — CBC WITH DIFFERENTIAL (CANCER CENTER ONLY)
Abs Immature Granulocytes: 0.02 10*3/uL (ref 0.00–0.07)
Basophils Absolute: 0 10*3/uL (ref 0.0–0.1)
Basophils Relative: 1 %
EOS ABS: 0.3 10*3/uL (ref 0.0–0.5)
EOS PCT: 8 %
HEMATOCRIT: 37.5 % (ref 36.0–46.0)
Hemoglobin: 12.4 g/dL (ref 12.0–15.0)
IMMATURE GRANULOCYTES: 1 %
Lymphocytes Relative: 13 %
Lymphs Abs: 0.5 10*3/uL — ABNORMAL LOW (ref 0.7–4.0)
MCH: 30.5 pg (ref 26.0–34.0)
MCHC: 33.1 g/dL (ref 30.0–36.0)
MCV: 92.4 fL (ref 80.0–100.0)
MONO ABS: 0.4 10*3/uL (ref 0.1–1.0)
MONOS PCT: 11 %
NEUTROS ABS: 2.6 10*3/uL (ref 1.7–7.7)
Neutrophils Relative %: 66 %
PLATELETS: 258 10*3/uL (ref 150–400)
RBC: 4.06 MIL/uL (ref 3.87–5.11)
RDW: 14.1 % (ref 11.5–15.5)
WBC Count: 4 10*3/uL (ref 4.0–10.5)
nRBC: 0 % (ref 0.0–0.2)

## 2018-01-16 LAB — CMP (CANCER CENTER ONLY)
ALT: 16 U/L (ref 0–44)
ANION GAP: 10 (ref 5–15)
AST: 13 U/L — ABNORMAL LOW (ref 15–41)
Albumin: 3.4 g/dL — ABNORMAL LOW (ref 3.5–5.0)
Alkaline Phosphatase: 45 U/L (ref 38–126)
BUN: 21 mg/dL (ref 8–23)
CALCIUM: 8.7 mg/dL — AB (ref 8.9–10.3)
CHLORIDE: 108 mmol/L (ref 98–111)
CO2: 22 mmol/L (ref 22–32)
CREATININE: 0.81 mg/dL (ref 0.44–1.00)
Glucose, Bld: 109 mg/dL — ABNORMAL HIGH (ref 70–99)
POTASSIUM: 3.8 mmol/L (ref 3.5–5.1)
SODIUM: 140 mmol/L (ref 135–145)
Total Bilirubin: 0.4 mg/dL (ref 0.3–1.2)
Total Protein: 6.1 g/dL — ABNORMAL LOW (ref 6.5–8.1)

## 2018-01-16 LAB — TSH: TSH: 9.085 u[IU]/mL — ABNORMAL HIGH (ref 0.308–3.960)

## 2018-01-16 MED ORDER — INFLUENZA VAC SPLIT QUAD 0.5 ML IM SUSY
0.5000 mL | PREFILLED_SYRINGE | Freq: Once | INTRAMUSCULAR | Status: AC
  Administered 2018-01-16: 0.5 mL via INTRAMUSCULAR

## 2018-01-16 MED ORDER — GABAPENTIN 300 MG PO CAPS: 300.0000 mg | ORAL_CAPSULE | Freq: Two times a day (BID) | ORAL | 11 refills | Status: DC

## 2018-01-16 MED ORDER — INFLUENZA VAC SPLIT QUAD 0.5 ML IM SUSY
PREFILLED_SYRINGE | INTRAMUSCULAR | Status: AC
  Filled 2018-01-16: qty 0.5

## 2018-01-16 NOTE — Assessment & Plan Note (Signed)
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

## 2018-01-16 NOTE — Assessment & Plan Note (Signed)
She has excessive fatigue and recent weight gain Recheck TSH is high We will forward results to her endocrinologist for medication adjustment

## 2018-01-16 NOTE — Assessment & Plan Note (Signed)
She is not symptomatic Last CT was negative Plan to repeat CT next month. We discussed the importance of preventive care and reviewed the vaccination programs. She does not have any prior allergic reactions to influenza vaccination. She agrees to proceed with influenza vaccination today and we will administer it today at the clinic.

## 2018-01-16 NOTE — Assessment & Plan Note (Signed)
Her last CT scan of the chest, abdomen and pelvis showed no signs of cancer recurrence The patient has complete response with recent immunotherapy She has recovered from all the side effects of treatment We discussed the risks, benefits, side effects of continuing treatment in the maintenance fashion versus antiestrogen therapy versus chemo holiday The patient favors chemo holiday I plan to repeat CT imaging in next month before her appointment to see GYN oncologist.  She is educated to watch out for signs and symptoms of cancer recurrence

## 2018-01-16 NOTE — Progress Notes (Signed)
[place] OFFICE PROGRESS NOTE  Patient Care Team: [name], MD as PCP - General (Internal Medicine) Jacelyn Pi, MD as Consulting Physician (Endocrinology) Heath Lark, MD as Consulting Physician (Hematology and Oncology)  ASSESSMENT & PLAN:  Endometrial cancer [facility]) Her last CT scan of the chest, abdomen and pelvis showed no signs of cancer recurrence The patient has complete response with recent immunotherapy She has recovered from all the side effects of treatment We discussed the risks, benefits, side effects of continuing treatment in the maintenance fashion versus antiestrogen therapy versus chemo holiday The patient favors chemo holiday I plan to repeat CT imaging in next month before her appointment to see GYN oncologist.  She is educated to watch out for signs and symptoms of cancer recurrence   Metastasis to lung [facility]) She is not symptomatic Last CT was negative Plan to repeat CT next month. We discussed the importance of preventive care and reviewed the vaccination programs. She does not have any prior allergic reactions to influenza vaccination. She agrees to proceed with influenza vaccination today and we will administer it today at the clinic.   Hypothyroidism She has excessive fatigue and recent weight gain Recheck TSH is high We will forward results to her endocrinologist for medication adjustment  Total body pain Her chronic inflammatory arthritis pain has improved but now she is suffering from bilateral carpal tunnel syndrome with neuropathy I recommend a trial of gabapentin for symptom management I warned her about risk of sedation and constipation   No orders of the defined types were placed in this encounter.   INTERVAL HISTORY: Please see below for problem oriented charting. She returns for further follow-up She complained of excessive fatigue and weight gain She also complained of bilateral carpal tunnel syndrome Her  inflammatory arthritis is improved but now she has debilitating tingling and numbness in her hands and feet No recent infection, fever or chills Denies recent cough, chest pain or shortness of breath  SUMMARY OF ONCOLOGIC HISTORY: Oncology History   Abnormal MSI, germline mutation was negative Endometrioid     Endometrial cancer ([place])   09/15/2014 Pathology Results    Endometrium, curettage - ENDOMETRIAL ADENOCARCINOMA. - SEE COMMENT. Microscopic Comment Although definitive characterization is best performed on resection specimen, as sampled, the endometrial adenocarcinoma appears to be endometrioid subtype, FIGO grade 1.     09/15/2014 Surgery    PreOp: postmenopausal bleeding, cervical stenosis PostOp: same and uterine polyp Procedure:  Hysteroscopy, Dilation and Curettage, Myosure polypectomy Surgeon: Dr. Janyth Pupa  Findings:8cm uterus with thickened polypoid-like endometrium, large 2cm irregular appearing polyp  Specimens: 1) endometrial curettings    10/26/2014 Pathology Results    1. Uterus +/- tubes/ovaries, neoplastic - INVASIVE ADENOCARCINOMA, ENDOMETRIOID TYPE, SEE COMMENT. - TUMOR INVOLVES LESS THAN ONE HALF MYOMETRIAL THICKNESS. - TUMOR INVOLVES UTERINE ADENOMYOSIS. - BENIGN LEIOMYOMATA (UP TO 2.5 CM). - BENIGN CERVICAL MUCOSA; NEGATIVE FOR INTRAEPITHELIAL LESION OR MALIGNANCY. - BENIGN RIGHT AND LEFT OVARIES; NEGATIVE FOR ATYPIA OR MALIGNANCY. - BENIGN RIGHT AND LEFT FALLOPIAN TUBE; NEGATIVE FOR ATYPIA OR MALIGNANCY. - BENIGN PARATUBAL CYSTS; NEGATIVE FOR ATYPIA OR MALIGNANCY. - SEE TUMOR SYNOPTIC TEMPLATE BELOW. 2. Lymph nodes, regional resection, right pelvic - FOUR LYMPH NODES, NEGATIVE FOR TUMOR (0/4). 3. Lymph nodes, regional resection, left pelvic - FOUR LYMPH NODES, NEGATIVE FOR TUMOR (0/4). Microscopic Comment 1. ONCOLOGY TABLE-UTERUS, CARCINOMA OR CARCINOSARCOMA Specimen: Uterus and bilateral fallopian tubes and ovaries. Procedure: Hysterectomy  and bilateral salpingo-oophorectomy. Lymph node sampling performed: Yes. Specimen integrity: Intact. Maximum  tumor size: 4.0 cm (tumor involved entire endometrium) Histologic type: Adenocarcinoma, endometrioid type. Grade: 1 Myometrial invasion: 0.5 cm where myometrium is 1.5 cm in thickness Cervical stromal involvement: Absent. Extent of involvement of other organs: None Lymph - vascular invasion: Absent. Peritoneal washings: N/A Lymph nodes: # examined - 8 ; # positive - 0 Pelvic lymph nodes: N/A involved of N/A lymph nodes. Para-aortic lymph nodes: N/A involved of N/A lymph nodes. Other (specify involvement and site): N/A TNM code: pT1a, pN0 FIGO Stage (based on pathologic findings, needs clinical correlation): N/A Comments: None MSI testing abnormal    10/26/2014 Surgery    Surgery: Total robotic hysterectomy, bilateral salpingo-oophorectomy, bilateral pelvic lymph node dissection.   Surgeons:  Lucita Lora. Alycia Rossetti, MD; Lahoma Crocker, MD   Pathology: Uterus, cervix, bilateral tubes and ovaries, bilateral pelvic lymph nodes to pathology.   Operative findings: Omentum adherent to midline vertical incision. Fibroid uterus. Normal adnexa. Adhesions of anterior bladder to uterus. Frozen section with grade 1 cancer focally involving adenomyosis, minimal myometrial invasion.    11/17/2014 Imaging    Ct abdomen 1. Status post hysterectomy. Pelvic edema which is greater than typically seen 3 weeks postop. Suspicious for postoperative infection. 2. Bladder wall thickening and irregularity. Although this could be partially due to underdistention, Concurrent cystitis cannot be excluded. 3. Bilateral fluid density lesions along the pelvic sidewalls. Favored to represent seromas or lymphangiomas. No specific features to suggest abscess. If the patient's symptoms persist after appropriate antibiotic therapy, aspiration of the largest left pelvic sidewall "Lesion" should be considered. 4.  Suspicion of mild hepatic steatosis. Indeterminate right hepatic lobe lesion. If definitive characterization is desired in this patient with history of primary malignancy, nonemergent pre and post contrast abdominal MRI could be performed. 5. Hiatal hernia.    11/25/2014 Imaging    1. Persistent bilateral pelvic sidewall fluid collections, left greater than right, with left-sided inflammatory changes. 2. Technically successful 12 French left pelvic abscess drain catheter placement. A sample of the aspirate was sent for Gram stain, culture and sensitivity.    12/08/2014 Imaging    CT abdomen and pelvis 1. Complete resolution of dominant fluid collection within the left hemipelvis following percutaneous drainage catheter placement. This percutaneous drainage catheter was subsequent removed intact at the patient's bedside. 2. Continued decrease in size of bilobed fluid collections within the right hemipelvis with superficial component measuring 1.6 cm and posterior component measuring 2.6 cm, indeterminate though both favored to represent evolving seromas. 3. Unchanged indeterminate approximately 1.2 cm hepatic lesion. Further evaluation with contrast-enhanced abdominal MRI could be performed as clinically indicated. 4. Colonic diverticulosis without evidence of diverticulitis.    10/19/2016 Imaging    New 2.6 cm soft tissue mass involving right vaginal cuff, consistent with locally recurrent carcinoma.  No other sites of metastatic disease identified.  Colonic diverticulosis, without radiographic evidence of diverticulitis.  Mild hepatic steatosis.     10/24/2016 Relapse/Recurrence    Vaginal cuff recurrence. No evidence of distant disease.    10/24/2016 Pathology Results    Vagina, biopsy, right apex - ADENOCARCINOMA - SEE COMMENT Microscopic Comment The morphologic features are consistent with the patient's previously diagnosed endometrioid adenocarcinoma    11/08/2016 - 12/27/2016  Radiation Therapy    45 Gy in 25 fractions of 1.8 Gy. The residual pelvic mass was boosted to 14 Gy in 7 fractions of 2 Gy. No intracavitary lesion seen at completion of pelvic fields to boost with brachytherapy.    06/12/2017 Imaging    CT abdomen  and pelvis 1. New 1.1 by 0.8 cm right lower lobe pulmonary nodule, concerning for pulmonary metastatic disease. Possibilities for further assessment include biopsy or nuclear medicine PET-CT to assess this lesion and the rest of the neck/chest/abdomen/pelvis for other potential hypermetabolic lesions. 2. The previous soft tissue mass along the right vaginal cuff is no longer present. 3. Stable and likely benign right hepatic lobe hypodense lesion. 4. Other imaging findings of potential clinical significance: Sigmoid colon diverticulosis. Aortic Atherosclerosis (ICD10-I70.0). Multilevel lumbar degenerative disc disease. Mild cardiomegaly. Small type 1 hiatal hernia.    07/05/2017 Imaging    Ct chest Several bilateral pulmonary nodules measuring up to 11 mm, highly suspicious for pulmonary metastases.   No evidence of lymphadenopathy or pleural effusion.  Aortic Atherosclerosis (ICD10-I70.0).    07/25/2017 Imaging    Status post CT-guided lung nodule biopsy. Tissue specimen sent to pathology for complete histopathologic analysis.    07/25/2017 Pathology Results    Lung, needle/core biopsy(ies), RLL - METASTATIC ADENOCARCINOMA - SEE COMMENT Microscopic Comment By immunohistochemistry, the neoplastic cells are positive for Pax-8 and ER but negative for TTF-1. Overall, this immunoprofile is consistent with metastasis from the patient's known endometrioid adenocarcinoma    07/30/2017 Cancer Staging    Staging form: Corpus Uteri - Carcinoma and Carcinosarcoma, AJCC 8th Edition - Clinical: Stage IVB (rcT1, cN0, pM1) - Signed by Heath Lark, MD on 07/30/2017    08/09/2017 -  Chemotherapy    The patient had pembrolizumab for chemotherapy treatment.      10/10/2017 Imaging    1. Interval decrease in size and contour. The of previously described right and left lower lobe pulmonary nodules. 2. Interval increase in size of mediastinal and hilar adenopathy, potentially metastatic in etiology. 3. Aortic Atherosclerosis (ICD10-I70.0).    11/08/2017 - 11/10/2017 Hospital Admission    She was admitted to the hospital due to uncontrolled pain    11/29/2017 Imaging    Chest Impression:  No thoracic metastasis. No discrete pulmonary nodules identified.  Abdomen / Pelvis Impression:  No evidence of local recurrence or metastatic endometrial carcinoma in the abdomen pelvis.     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      Metastasis to lung ([place])   07/30/2017 Initial Diagnosis    Metastasis to lung ([place])    08/02/2017 -  Chemotherapy    The patient had pembrolizumab (KEYTRUDA) 200 mg in sodium chloride 0.9 % 50 mL chemo infusion, 200 mg, Intravenous, Once, 5 of 7 cycles Administration: 200 mg (08/09/2017), 200 mg (08/30/2017), 200 mg (09/20/2017), 200 mg (10/11/2017), 200 mg (11/01/2017)  for chemotherapy treatment.      REVIEW OF SYSTEMS:   Constitutional: Denies fevers, chills or abnormal weight loss Eyes: Denies blurriness of vision Ears, nose, mouth, throat, and face: Denies mucositis or sore throat Respiratory: Denies cough, dyspnea or wheezes Cardiovascular: Denies palpitation, chest discomfort or lower extremity swelling Gastrointestinal:  Denies nausea, heartburn or change in bowel habits Skin: Denies abnormal skin rashes Lymphatics: Denies new lymphadenopathy or easy  bruising Neurological:Denies numbness, tingling or new weaknesses Behavioral/Psych: Mood is stable, no new changes  All other systems were reviewed  with the patient and are negative.  I have reviewed the past medical history, past surgical history, social history and family history with the patient and they are unchanged from previous note.  ALLERGIES:  is allergic to hydrocodone.  MEDICATIONS:  Current Outpatient Medications  Medication Sig Dispense Refill  . acetaminophen (TYLENOL) 325 MG tablet Take 650 mg by mouth every 6 (six) hours as needed for moderate pain.    . Calcium Carbonate-Vitamin D (CALCIUM-D PO) Take 1 tablet by mouth daily.    Marland Kitchen FLUoxetine (PROZAC) 40 MG capsule Take 40 mg by mouth daily.    Marland Kitchen gabapentin (NEURONTIN) 300 MG capsule Take 1 capsule (300 mg total) by mouth 2 (two) times daily. 60 capsule 11  . levothyroxine (SYNTHROID, LEVOTHROID) 100 MCG tablet Take 100 mcg by mouth daily before breakfast.     . pantoprazole (PROTONIX) 40 MG tablet Take 1 tablet (40 mg total) by mouth daily before breakfast. While taking naproxen. Any generic PPI is okay. 30 tablet 3   No current facility-administered medications for this visit.     PHYSICAL EXAMINATION: ECOG PERFORMANCE STATUS: 1 - Symptomatic but completely ambulatory  Vitals:   01/16/18 1201  BP: 119/84  Pulse: 71  Resp: 18  Temp: 98 F (36.7 C)  SpO2: 98%   Filed Weights   01/16/18 1201  Weight: 178 lb (80.7 kg)    GENERAL:alert, no distress and comfortable SKIN: skin color, texture, turgor are normal, no rashes or significant lesions EYES: normal, Conjunctiva are pink and non-injected, sclera clear OROPHARYNX:no exudate, no erythema and lips, buccal mucosa, and tongue normal  NECK: supple, thyroid normal size, non-tender, without nodularity LYMPH:  no palpable lymphadenopathy in the cervical, axillary or inguinal LUNGS: clear to auscultation and percussion with normal breathing effort HEART: regular  rate & rhythm and no murmurs and no lower extremity edema ABDOMEN:abdomen soft, non-tender and normal bowel sounds Musculoskeletal:no cyanosis of digits and no clubbing  NEURO: alert & oriented x 3 with fluent speech, no focal motor/sensory deficits  LABORATORY DATA:  I have reviewed the data as listed    Component Value Date/Time   NA 140 01/16/2018 1144   K 3.8 01/16/2018 1144   CL 108 01/16/2018 1144   CO2 22 01/16/2018 1144   GLUCOSE 109 (H) 01/16/2018 1144   BUN 21 01/16/2018 1144   BUN 18.7 10/17/2016 1353   CREATININE 0.81 01/16/2018 1144   CREATININE 0.8 10/17/2016 1353   CALCIUM 8.7 (L) 01/16/2018 1144   PROT 6.1 (L) 01/16/2018 1144   ALBUMIN 3.4 (L) 01/16/2018 1144   AST 13 (L) 01/16/2018 1144   ALT 16 01/16/2018 1144   ALKPHOS 45 01/16/2018 1144   BILITOT 0.4 01/16/2018 1144   GFRNONAA >60 01/16/2018 1144   GFRAA >60 01/16/2018 1144    No results found for: SPEP, UPEP  Lab Results  Component Value Date   WBC 4.0 01/16/2018   NEUTROABS 2.6 01/16/2018   HGB 12.4 01/16/2018   HCT 37.5 01/16/2018   MCV 92.4 01/16/2018   PLT 258 01/16/2018      Chemistry      Component Value Date/Time   NA 140 01/16/2018 1144   K 3.8 01/16/2018 1144   CL 108 01/16/2018 1144   CO2 22 01/16/2018 1144   BUN 21 01/16/2018 1144   BUN 18.7 10/17/2016 1353   CREATININE 0.81 01/16/2018 1144   CREATININE 0.8 10/17/2016 1353      Component Value Date/Time   CALCIUM 8.7 (L) 01/16/2018 1144  ALKPHOS 45 01/16/2018 1144   AST 13 (L) 01/16/2018 1144   ALT 16 01/16/2018 1144   BILITOT 0.4 01/16/2018 1144      All questions were answered. The patient knows to call the clinic with any problems, questions or concerns. No barriers to learning was detected.  I spent 15 minutes counseling the patient face to face. The total time spent in the appointment was 20 minutes and more than 50% was on counseling and review of test results  Heath Lark, MD 01/16/2018 2:43 PM

## 2018-01-16 NOTE — Telephone Encounter (Signed)
Called and given below message. She verbalized understanding.  Called TSH results and fax lab results to Dr. Almetta Lovely office at [phone].

## 2018-01-16 NOTE — Telephone Encounter (Signed)
-----   Message from Heath Lark, MD sent at 01/16/2018  1:09 PM EDT ----- Regarding: high TSH Tell her weight gain is due to high TSH She would need more medication/higher dose of thyroid Please call Dr. Almetta Lovely office (her endocrinologist) with test results  ----- Message ----- From: Interface, Lab In [place] Sent: 01/16/2018  11:57 AM EDT To: Heath Lark, MD

## 2018-01-21 ENCOUNTER — Telehealth: Payer: Self-pay | Admitting: Hematology and Oncology

## 2018-01-21 NOTE — Telephone Encounter (Signed)
Faxed medical records to [facility] @ [phone]. Release [id number]

## 2018-01-24 ENCOUNTER — Encounter: Payer: Self-pay | Admitting: Gynecologic Oncology

## 2018-01-24 NOTE — Progress Notes (Signed)
Peer to peer performed for CT scan of chest, abdomen, and pelvis for Dec 2019.  Scans approved with approval of [id number].

## 2018-01-27 NOTE — Progress Notes (Signed)
FMLA successfully faxed to [employer] at [phone]. Mailed copy to patient address on file. 

## 2018-01-30 ENCOUNTER — Ambulatory Visit: Payer: Self-pay | Admitting: Radiation Oncology

## 2018-02-04 ENCOUNTER — Encounter: Payer: Self-pay | Admitting: Gynecologic Oncology

## 2018-02-04 NOTE — Progress Notes (Signed)
Peer to peer performed with Dr. Tomma Lightning with [employer] for the patient's [facility].  He states the pharmacy had denied it since information on the last sheet of the request was incomplete in regards to her response.  Discussed her current situation including a chemo holiday with the plan for repeat imaging in Dec 2019.  He approved the [facility] for one year.

## 2018-02-08 ENCOUNTER — Other Ambulatory Visit: Payer: Self-pay | Admitting: Hematology and Oncology

## 2018-02-10 ENCOUNTER — Other Ambulatory Visit: Payer: Self-pay

## 2018-02-20 NOTE — Progress Notes (Signed)
[place] at [facility]   Progress Note : Established Patient FOLLOW-UP  Chief Complaint  Patient presents with  . 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[facility])   GYN Oncologic Summary 1. Stage IA, Grade 1 Endometrial Cancer o 10/2014 Surgery with Dr.Gehrig o 8-12/2016 Recurrence at vag cuff --> EBRTx with Dr. Sondra Come o Spring 2019 lung mets - present  Immunotherapy Dr.Gorsuch 2. MLH1 absent; [employer] 47 gene panel negative for mutation.  HPI Ashley Villegas is a 67 y.o.    Interval History  She presents today for Gyn Onc followup and pelvic.  I last saw her June 2019 with recommendation for pelvic with Dr. Sondra Come in 3 months then return with [name] in December. She returns for our followup today. She states she saw Dr. Sondra Come in September.    A CT was ordered by Dr. Alvy Bimler last week.  She is currently on (Immuntx) chemo-holiday  She has no complaints, no bleeding.    Oncologic Course:  Initially seen in consultation on 09/30/14, referred by Dr Nelda Marseille for grade 1 endometrial cancer.  She then underwent a robotic hysterectomy, BSO and bilateral pelvic node dissection on 10/27/39 without complications.  Her postoperative course was uncomplicated .  Her final pathologic diagnosis - Stage IA Grade 1 endometrioid endometrial cancer with no  lymphovascular space invasion, 5/15 mm (33%) of myometrial invasion and negative lymph nodes. Postoperatively she developed bilateral symptomatic lymphocyst. These were drained and she was treated with antibiotics.  IMPRESSION: 1. Status post hysterectomy. Pelvic edema which is greater than typically seen 3 weeks postop. Suspicious for postoperative infection. 2. Bladder wall thickening and irregularity. Although this could be partially due to underdistention, Concurrent cystitis cannot be excluded. 3. Bilateral fluid density lesions along the pelvic sidewalls. Favored to represent seromas or lymphangiomas. No specific  features to suggest abscess. If the patient's symptoms persist after appropriate antibiotic therapy, aspiration of the largest left pelvic sidewall "Lesion" should be considered. 4. Suspicion of mild hepatic steatosis. Indeterminate right hepatic lobe lesion. If definitive characterization is desired in this patient with history of primary malignancy, nonemergent pre and post contrast abdominal MRI could be performed. 5. Hiatal hernia.  IMPRESSION (9/16): 1. Complete resolution of dominant fluid collection within the left hemipelvis following percutaneous drainage catheter placement. This percutaneous drainage catheter was subsequent removed intact at the patient's bedside. 2. Continued decrease in size of bilobed fluid collections within the right hemipelvis with superficial component measuring 1.6 cm and posterior component measuring 2.6 cm, indeterminate though both favored to represent evolving seromas. 3. Unchanged indeterminate approximately 1.2 cm hepatic lesion. 4. Colonic diverticulosis without evidence of diverticulitis.  Prior documentation by Dr. Alycia Rossetti "I last saw her in March 2017. She did not keep her March 2018 appointment and called stating that she was having pelvic pain and she needed to come in. She did not see her physician Dr. Suanne Marker in between our 2 visits either until last week when she saw her on Wednesday complaining of pain. She states that her a few months ago she developed food poisoning after eating out and that's when this discomfort started. On the pain is primarily on the right is worse with sitting some days are better than others. It occasionally and rarely does wake her up at night but is mostly during the day. I saw her in clinic at which time she was tender and I felt a fullness on RV exam towards her right side. A CT scan was obtained that revealed:" IMPRESSION  10/19/16: New 2.6 cm soft tissue mass involving right vaginal cuff, consistent with locally recurrent  carcinoma.No other sites of metastatic disease identified. Colonic diverticulosis, without radiographic evidence of diverticulitis. Mild hepatic steatosis.   10/24/16: Diagnosis Vagina, biopsy, right apex - ADENOCARCINOMA - SEE COMMENT Microscopic Comment  The morphologic features are consistent with the patient's previously diagnosed endometrioid adenocarcinoma.  11/08/2016-12/27/2016  1. The pelvis was treated to 45 Gy in 25 fractions of 1.8 Gy. 2. The residual pelvic mass was boosted to 14 Gy in 7 fractions of 2 Gy. (No intracavitary lesion seen at completion of pelvic fields to boost with brachytherapy).  At that time CT scan for her boost field revealed the tumor mass to be shrinking and they held off on repeating diagnostic CT scan at this time unless symptoms worsen.  Spring 2019 found to have lung mets and was placed on Keytruda noting [facility] 1 loss of protein expression on her tumor.    RADIOLOGY Ct Chest W Contrast  Result Date: 02/21/2018 CLINICAL DATA:  67 year old with history of endometrial cancer diagnosed in 2016 status post hysterectomy and radiation therapy, completed in October 2018. EXAM: CT CHEST, ABDOMEN, AND PELVIS WITH CONTRAST TECHNIQUE: Multidetector CT imaging of the chest, abdomen and pelvis was performed following the standard protocol during bolus administration of intravenous contrast. CONTRAST:  121m OMNIPAQUE IOHEXOL 300 MG/ML  SOLN COMPARISON:  CT the chest, abdomen and pelvis 11/28/2017. FINDINGS: CT CHEST FINDINGS Cardiovascular: Heart size is normal. There is no significant pericardial fluid, thickening or pericardial calcification. No atherosclerotic calcifications in the thoracic aorta or the coronary arteries. Mediastinum/Nodes: No pathologically enlarged mediastinal or hilar lymph nodes. Esophagus is unremarkable in appearance. No axillary lymphadenopathy. Lungs/Pleura: No suspicious appearing pulmonary nodules or masses are noted. No acute consolidative  airspace disease. No pleural effusions. Scattered areas of septal thickening and focal architectural distortion in the periphery of the right lung, most compatible with areas of chronic post infectious or inflammatory scarring. Musculoskeletal: There are no aggressive appearing lytic or blastic lesions noted in the visualized portions of the skeleton. CT ABDOMEN PELVIS FINDINGS Hepatobiliary: 1.0 x 1.5 cm low-attenuation lesion in segment 7 of the liver, compatible with a simple cyst. No other suspicious hepatic lesions. No intra or extrahepatic biliary ductal dilatation. Gallbladder is normal in appearance. Pancreas: No pancreatic mass. No pancreatic ductal dilatation. No pancreatic or peripancreatic fluid or inflammatory changes. Spleen: Unremarkable. Adrenals/Urinary Tract: Bilateral kidneys and bilateral adrenal glands are normal in appearance. No hydroureteronephrosis. Urinary bladder is normal in appearance. Stomach/Bowel: Normal appearance of the stomach. No pathologic dilatation of small bowel or colon. Several colonic diverticulae are noted, particularly in the sigmoid colon, without surrounding inflammatory changes to suggest an acute diverticulitis at this time. Normal appendix. Vascular/Lymphatic: Aortic atherosclerosis, without evidence of aneurysm or dissection noted in the abdominal or pelvic vasculature. Reproductive: Status post total abdominal hysterectomy and bilateral salpingo-oophorectomy. No unexpected soft tissue lesion in the low anatomic pelvis to suggest locally recurrent disease. Other: No significant volume of ascites.  No pneumoperitoneum. Musculoskeletal: There are no aggressive appearing lytic or blastic lesions noted in the visualized portions of the skeleton. IMPRESSION: 1. No findings to suggest metastatic disease in the chest, abdomen or pelvis. 2. Mild colonic diverticulosis without evidence of acute diverticulitis at this time. 3. Aortic atherosclerosis. 4. Additional incidental  findings, as above. Electronically Signed   By: DVinnie LangtonM.D.   On: 02/21/2018 14:13   Ct Chest W Contrast  Result Date: 11/29/2017 CLINICAL DATA:  Endometrial carcinoma.  Chemotherapy ongoing. EXAM: CT CHEST, ABDOMEN, AND PELVIS WITH CONTRAST TECHNIQUE: Multidetector CT imaging of the chest, abdomen and pelvis was performed following the standard protocol during bolus administration of intravenous contrast. CONTRAST:  25m ISOVUE-300 IOPAMIDOL (ISOVUE-300) INJECTION 61%, 1067mOMNIPAQUE IOHEXOL 300 MG/ML SOLN COMPARISON:  10/09/2017 FINDINGS: CT CHEST FINDINGS Cardiovascular: No significant vascular findings. Normal heart size. No pericardial effusion. Mediastinum/Nodes: No axillary supraclavicular adenopathy. No mediastinal hilar adenopathy. No pericardial effusion. Esophagus normal. Lungs/Pleura: No suspicious pulmonary nodules.  Airways normal Musculoskeletal: No aggressive osseous lesion. CT ABDOMEN AND PELVIS FINDINGS Hepatobiliary: Low-density lesion in the RIGHT hepatic lobe has simple fluid attenuation, unchanged from prior, and most consistent with benign cysts. No new hepatic lesion. Pancreas: Pancreas is normal. No ductal dilatation. No pancreatic inflammation. Spleen: Normal spleen Adrenals/urinary tract: Adrenal glands and kidneys are normal. The ureters and bladder normal. Stomach/Bowel: Stomach, small bowel, appendix, and cecum are normal. The colon and rectosigmoid colon are normal. Vascular/Lymphatic: Abdominal aorta is normal caliber. There is no retroperitoneal or periportal lymphadenopathy. No pelvic lymphadenopathy. Reproductive: Post hysterectomy anatomy. No measurable abnormality along the vaginal cuff. Other: No peritoneal disease.  No free fluid Musculoskeletal: No aggressive osseous lesion. IMPRESSION: Chest Impression: No thoracic metastasis.  No discrete pulmonary nodules identified. Abdomen / Pelvis Impression: No evidence of local recurrence or metastatic endometrial  carcinoma in the abdomen pelvis. Electronically Signed   By: StSuzy Bouchard.D.   On: 11/29/2017 11:00   Ct Abdomen Pelvis W Contrast  Result Date: 02/21/2018 CLINICAL DATA:  [age]ear old with history of endometrial cancer diagnosed in 2016 status post hysterectomy and radiation therapy, completed in October 2018. EXAM: CT CHEST, ABDOMEN, AND PELVIS WITH CONTRAST TECHNIQUE: Multidetector CT imaging of the chest, abdomen and pelvis was performed following the standard protocol during bolus administration of intravenous contrast. CONTRAST:  10073mMNIPAQUE IOHEXOL 300 MG/ML  SOLN COMPARISON:  CT the chest, abdomen and pelvis 11/28/2017. FINDINGS: CT CHEST FINDINGS Cardiovascular: Heart size is normal. There is no significant pericardial fluid, thickening or pericardial calcification. No atherosclerotic calcifications in the thoracic aorta or the coronary arteries. Mediastinum/Nodes: No pathologically enlarged mediastinal or hilar lymph nodes. Esophagus is unremarkable in appearance. No axillary lymphadenopathy. Lungs/Pleura: No suspicious appearing pulmonary nodules or masses are noted. No acute consolidative airspace disease. No pleural effusions. Scattered areas of septal thickening and focal architectural distortion in the periphery of the right lung, most compatible with areas of chronic post infectious or inflammatory scarring. Musculoskeletal: There are no aggressive appearing lytic or blastic lesions noted in the visualized portions of the skeleton. CT ABDOMEN PELVIS FINDINGS Hepatobiliary: 1.0 x 1.5 cm low-attenuation lesion in segment 7 of the liver, compatible with a simple cyst. No other suspicious hepatic lesions. No intra or extrahepatic biliary ductal dilatation. Gallbladder is normal in appearance. Pancreas: No pancreatic mass. No pancreatic ductal dilatation. No pancreatic or peripancreatic fluid or inflammatory changes. Spleen: Unremarkable. Adrenals/Urinary Tract: Bilateral kidneys and  bilateral adrenal glands are normal in appearance. No hydroureteronephrosis. Urinary bladder is normal in appearance. Stomach/Bowel: Normal appearance of the stomach. No pathologic dilatation of small bowel or colon. Several colonic diverticulae are noted, particularly in the sigmoid colon, without surrounding inflammatory changes to suggest an acute diverticulitis at this time. Normal appendix. Vascular/Lymphatic: Aortic atherosclerosis, without evidence of aneurysm or dissection noted in the abdominal or pelvic vasculature. Reproductive: Status post total abdominal hysterectomy and bilateral salpingo-oophorectomy. No unexpected soft tissue lesion in the low anatomic pelvis to suggest locally recurrent disease. Other: No significant  volume of ascites.  No pneumoperitoneum. Musculoskeletal: There are no aggressive appearing lytic or blastic lesions noted in the visualized portions of the skeleton. IMPRESSION: 1. No findings to suggest metastatic disease in the chest, abdomen or pelvis. 2. Mild colonic diverticulosis without evidence of acute diverticulitis at this time. 3. Aortic atherosclerosis. 4. Additional incidental findings, as above. Electronically Signed   By: Vinnie Langton M.D.   On: 02/21/2018 14:13   Ct Abdomen Pelvis W Contrast  Result Date: 11/29/2017 CLINICAL DATA:  Endometrial carcinoma.  Chemotherapy ongoing. EXAM: CT CHEST, ABDOMEN, AND PELVIS WITH CONTRAST TECHNIQUE: Multidetector CT imaging of the chest, abdomen and pelvis was performed following the standard protocol during bolus administration of intravenous contrast. CONTRAST:  73m ISOVUE-300 IOPAMIDOL (ISOVUE-300) INJECTION 61%, 1038mOMNIPAQUE IOHEXOL 300 MG/ML SOLN COMPARISON:  10/09/2017 FINDINGS: CT CHEST FINDINGS Cardiovascular: No significant vascular findings. Normal heart size. No pericardial effusion. Mediastinum/Nodes: No axillary supraclavicular adenopathy. No mediastinal hilar adenopathy. No pericardial effusion. Esophagus  normal. Lungs/Pleura: No suspicious pulmonary nodules.  Airways normal Musculoskeletal: No aggressive osseous lesion. CT ABDOMEN AND PELVIS FINDINGS Hepatobiliary: Low-density lesion in the RIGHT hepatic lobe has simple fluid attenuation, unchanged from prior, and most consistent with benign cysts. No new hepatic lesion. Pancreas: Pancreas is normal. No ductal dilatation. No pancreatic inflammation. Spleen: Normal spleen Adrenals/urinary tract: Adrenal glands and kidneys are normal. The ureters and bladder normal. Stomach/Bowel: Stomach, small bowel, appendix, and cecum are normal. The colon and rectosigmoid colon are normal. Vascular/Lymphatic: Abdominal aorta is normal caliber. There is no retroperitoneal or periportal lymphadenopathy. No pelvic lymphadenopathy. Reproductive: Post hysterectomy anatomy. No measurable abnormality along the vaginal cuff. Other: No peritoneal disease.  No free fluid Musculoskeletal: No aggressive osseous lesion. IMPRESSION: Chest Impression: No thoracic metastasis.  No discrete pulmonary nodules identified. Abdomen / Pelvis Impression: No evidence of local recurrence or metastatic endometrial carcinoma in the abdomen pelvis. Electronically Signed   By: StSuzy Bouchard.D.   On: 11/29/2017 11:00     Current Outpatient Medications on File Prior to Visit  Medication Sig Dispense Refill  . acetaminophen (TYLENOL) 325 MG tablet Take 650 mg by mouth every 6 (six) hours as needed for moderate pain.    . Calcium Carbonate-Vitamin D (CALCIUM-D PO) Take 1 tablet by mouth daily.    . Marland KitchenLUoxetine (PROZAC) 40 MG capsule Take 40 mg by mouth daily.    . Marland Kitchenevothyroxine (SYNTHROID, LEVOTHROID) 100 MCG tablet Take 112 mcg by mouth daily before breakfast.     . pantoprazole (PROTONIX) 40 MG tablet Take 1 tablet (40 mg total) by mouth daily before breakfast. While taking naproxen. Any generic PPI is okay. 30 tablet 3   No current facility-administered medications on file prior to visit.     Allergies  Allergen Reactions  . Hydrocodone Rash   Past Medical History:  Diagnosis Date  . Anxiety   . Arthritis   [facility]   [facility]  . Complication of anesthesia   . Depression   . Family history of uterine cancer   . GERD (gastroesophageal reflux disease)   . History of radiation therapy 11/08/2016-12/27/2016   pelvis 45 Gy in 25 fractions, pelvis boost 14 gy in 7 fractions  . Hypothyroidism    on synthroid   . PONV (postoperative nausea and vomiting)    Past Surgical History:  Procedure Laterality Date  . CESAREAN SECTION    . COLONOSCOPY    . DIAGNOSTIC LAPAROSCOPY     fibroid tumors on her ovaries   .  DILATION AND CURETTAGE OF UTERUS    . HAND SURGERY Right 2019   carpal tunnel  . HYSTEROSCOPY W/D&C N/A 09/15/2014   Procedure: DILATATION AND CURETTAGE /HYSTEROSCOPY/MYOSURE RESECTION OF POLYP;  Surgeon: Janyth Pupa, DO;  Location: [facility];  Service: Gynecology;  Laterality: N/A;  . KNEE SURGERY    . LYMPH NODE DISSECTION N/A 10/26/2014   Procedure: POSS LYMPHADENECTOMY ;  Surgeon: [name], MD;  Location: [facility];  Service: Gynecology;  Laterality: N/A;  . ROBOTIC ASSISTED TOTAL HYSTERECTOMY WITH BILATERAL SALPINGO OOPHERECTOMY Bilateral 10/26/2014   Procedure: ROBOTIC ASSISTED TOTAL HYSTERECTOMY WITH BILATERAL SALPINGO OOPHORECTOMY;  Surgeon: [name], MD;  Location: [facility];  Service: Gynecology;  Laterality: Bilateral;  . TUBAL LIGATION    . WISDOM TOOTH EXTRACTION     Family History  Problem Relation Age of Onset  . Breast cancer Maternal Aunt        dx late 50's/60's, died in 37's  . Uterine cancer Mother 64  . Heart failure Mother   . Dementia Father   . Heart attack Paternal Grandfather 57  . Other Sister 32       had abnromal finding on mammogram, unsure if cancer. Was told to 'remove part of of body'   Social History   Socioeconomic History  . Marital status: Single    Spouse name: Not on file  . Number of children: 2  .  Years of education: Not on file  . Highest education level: Not on file  Occupational History  . Occupation: Therapist, art  Social Needs  . Financial resource strain: Not on file  . Food insecurity:    Worry: Not on file    Inability: Not on file  . Transportation needs:    Medical: Not on file    Non-medical: Not on file  Tobacco Use  . Smoking status: Former Smoker    Packs/day: 0.50    Years: 13.00    Pack years: 6.50    Types: Cigarettes    Last attempt to quit: 10/20/2007    Years since quitting: 10.3  . Smokeless tobacco: Never Used  Substance and Sexual Activity  . Alcohol use: No  . Drug use: No  . Sexual activity: Not on file  Lifestyle  . Physical activity:    Days per week: Not on file    Minutes per session: Not on file  . Stress: Not on file  Relationships  . Social connections:    Talks on phone: Not on file    Gets together: Not on file    Attends religious service: Not on file    Active member of club or organization: Not on file    Attends meetings of clubs or organizations: Not on file    Relationship status: Not on file  . Intimate partner violence:    Fear of current or ex partner: Not on file    Emotionally abused: Not on file    Physically abused: Not on file    Forced sexual activity: Not on file  Other Topics Concern  . Not on file  Social History Narrative  . Not on file    Review of Systems  Respiratory: Positive for shortness of breath.   Genitourinary: Positive for frequency.   Psychiatric/Behavioral: Positive for depression. The patient is nervous/anxious.   All other systems reviewed and are negative.    Physical Exam: Blood pressure 132/64, pulse 75, temperature 98.1 F (36.7 C), temperature source Oral, resp. rate 18, height 5'  1.75" (1.568 m), weight 177 lb 6.4 oz (80.5 kg), SpO2 98 %.   General :  Well developed, 67 y.o., female in no apparent distress HEENT:  Normocephalic/atraumatic, symmetric, EOMI, eyelids  normal Neck:   Supple, no masses.  Lymphatics:  No cervical/ submandibular/ supraclavicular/ infraclavicular/ inguinal adenopathy Respiratory:  Respirations unlabored, no use of accessory muscles CV:   Deferred Breast:  Deferred Musculoskeletal: No CVA tenderness, normal muscle strength. Abdomen:  Soft, non-tender and nondistended. No evidence of hernia. No masses. Extremities:  No lymphedema, no erythema, non-tender. Skin:   Normal inspection Neuro/Psych:  No focal motor deficit, no abnormal mental status. Normal gait. Normal affect. Alert and oriented to person, place, and time  Genito Urinary: Vulva: Normal external female genitalia.  Bladder/urethra: Urethral meatus normal in size and location. No lesions or   masses, well supported bladder Speculum exam: Vagina: Notable for mildy narrow caliber secondary to radiation.  Some challenge ectasias consistent with radiation.  No lesion, no discharge, no bleeding. Cervix: Surgically absent Bimanual exam:  Uterus: Surgically absent  Adnexa: No masses. Rectovaginal:  Notable for some narrowing at the most distal aspect of my single digit exam.  Good tone, no masses, no cul de sac nodularity, no parametrial involvement or nodularity.  ASSESSMENT Recurrent uterine cancer  PLAN 1. Recurrent uterine cancer with lung mets  Currently on chemo (therapy) holiday  Systemic therapy plan per Dr. Alvy Bimler  Imaging followup per Dr. Alvy Bimler   She has no measurable disease on this recent scan ? Continued therapy goals 2. Followup  Pelvic every 3 months; alternating with Dr. Sondra Come  Face to face time with patient was 15 minutes. Over 50% of this time was spent on counseling and coordination of care.   Isabel Caprice, MD   Cc: [name], MD (PCP) Heath Lark, MD (Medical Oncologist)

## 2018-02-21 ENCOUNTER — Encounter (HOSPITAL_COMMUNITY): Payer: Self-pay

## 2018-02-21 ENCOUNTER — Ambulatory Visit (HOSPITAL_COMMUNITY)
Admission: RE | Admit: 2018-02-21 | Discharge: 2018-02-21 | Disposition: A | Discharge status: Home / Self Care | Payer: 59 | Source: Ambulatory Visit | Attending: Hematology and Oncology | Admitting: Hematology and Oncology

## 2018-02-21 ENCOUNTER — Inpatient Hospital Stay: Payer: 59 | Attending: Gynecologic Oncology

## 2018-02-21 DIAGNOSIS — E038 Other specified hypothyroidism: Secondary | ICD-10-CM

## 2018-02-21 DIAGNOSIS — C7800 Secondary malignant neoplasm of unspecified lung: Secondary | ICD-10-CM | POA: Diagnosis present

## 2018-02-21 DIAGNOSIS — C541 Malignant neoplasm of endometrium: Secondary | ICD-10-CM | POA: Diagnosis not present

## 2018-02-21 LAB — CMP (CANCER CENTER ONLY)
ALBUMIN: 3.7 g/dL (ref 3.5–5.0)
ALT: 17 U/L (ref 0–44)
ANION GAP: 9 (ref 5–15)
AST: 15 U/L (ref 15–41)
Alkaline Phosphatase: 53 U/L (ref 38–126)
BILIRUBIN TOTAL: 0.7 mg/dL (ref 0.3–1.2)
BUN: 14 mg/dL (ref 8–23)
CHLORIDE: 107 mmol/L (ref 98–111)
CO2: 25 mmol/L (ref 22–32)
Calcium: 9.3 mg/dL (ref 8.9–10.3)
Creatinine: 0.78 mg/dL (ref 0.44–1.00)
GFR, Est AFR Am: >60 mL/min (ref >60)
GFR, Estimated: >60 mL/min (ref >60)
GLUCOSE: 94 mg/dL (ref 70–99)
POTASSIUM: 4.3 mmol/L (ref 3.5–5.1)
SODIUM: 141 mmol/L (ref 135–145)
TOTAL PROTEIN: 6.5 g/dL (ref 6.5–8.1)

## 2018-02-21 LAB — CBC WITH DIFFERENTIAL (CANCER CENTER ONLY)
ABS IMMATURE GRANULOCYTES: 0.01 10*3/uL (ref 0.00–0.07)
BASOS PCT: 1 %
Basophils Absolute: 0.1 10*3/uL (ref 0.0–0.1)
Eosinophils Absolute: 0.3 10*3/uL (ref 0.0–0.5)
Eosinophils Relative: 6 %
HCT: 38.6 % (ref 36.0–46.0)
Hemoglobin: 13 g/dL (ref 12.0–15.0)
IMMATURE GRANULOCYTES: 0 %
LYMPHS PCT: 12 %
Lymphs Abs: 0.5 10*3/uL — ABNORMAL LOW (ref 0.7–4.0)
MCH: 30.6 pg (ref 26.0–34.0)
MCHC: 33.7 g/dL (ref 30.0–36.0)
MCV: 90.8 fL (ref 80.0–100.0)
MONO ABS: 0.5 10*3/uL (ref 0.1–1.0)
MONOS PCT: 13 %
NEUTROS ABS: 2.8 10*3/uL (ref 1.7–7.7)
Neutrophils Relative %: 68 %
PLATELETS: 261 10*3/uL (ref 150–400)
RBC: 4.25 MIL/uL (ref 3.87–5.11)
RDW: 13.7 % (ref 11.5–15.5)
WBC Count: 4.2 10*3/uL (ref 4.0–10.5)
nRBC: 0 % (ref 0.0–0.2)

## 2018-02-21 LAB — TSH: TSH: 0.655 u[IU]/mL (ref 0.308–3.960)

## 2018-02-21 MED ORDER — IOHEXOL 300 MG/ML  SOLN
100.0000 mL | Freq: Once | INTRAMUSCULAR | Status: AC | PRN
  Administered 2018-02-21: 100 mL via INTRAVENOUS

## 2018-02-21 MED ORDER — SODIUM CHLORIDE (PF) 0.9 % IJ SOLN
INTRAMUSCULAR | Status: AC
  Filled 2018-02-21: qty 50

## 2018-02-24 ENCOUNTER — Ambulatory Visit: Payer: 59 | Admitting: Obstetrics

## 2018-02-24 ENCOUNTER — Encounter: Payer: Self-pay | Admitting: Oncology

## 2018-02-24 ENCOUNTER — Telehealth: Payer: Self-pay | Admitting: Oncology

## 2018-02-24 ENCOUNTER — Inpatient Hospital Stay: Payer: 59 | Attending: Gynecologic Oncology | Admitting: Obstetrics

## 2018-02-24 ENCOUNTER — Encounter: Payer: Self-pay | Admitting: Obstetrics

## 2018-02-24 VITALS — BP 132/64 | HR 75 | Temp 98.1°F | Resp 18 | Ht 61.75 in | Wt 177.4 lb

## 2018-02-24 DIAGNOSIS — Z90710 Acquired absence of both cervix and uterus: Secondary | ICD-10-CM | POA: Insufficient documentation

## 2018-02-24 DIAGNOSIS — Z87891 Personal history of nicotine dependence: Secondary | ICD-10-CM | POA: Diagnosis not present

## 2018-02-24 DIAGNOSIS — Z90722 Acquired absence of ovaries, bilateral: Secondary | ICD-10-CM | POA: Diagnosis not present

## 2018-02-24 DIAGNOSIS — C7800 Secondary malignant neoplasm of unspecified lung: Secondary | ICD-10-CM | POA: Insufficient documentation

## 2018-02-24 DIAGNOSIS — Z9221 Personal history of antineoplastic chemotherapy: Secondary | ICD-10-CM | POA: Insufficient documentation

## 2018-02-24 DIAGNOSIS — C541 Malignant neoplasm of endometrium: Secondary | ICD-10-CM | POA: Diagnosis present

## 2018-02-24 NOTE — Telephone Encounter (Signed)
Left a message for Ashley Villegas advising her that her TSH results have been faxed to Dr. Almetta Lovely office per her request.

## 2018-02-24 NOTE — Patient Instructions (Signed)
1. Dr. Sondra Come in Spring as scheduled 2. Return to see [name] in 6 months for a pelvic 3. Continue care per Dr. Alvy Bimler for therapy

## 2018-03-02 ENCOUNTER — Other Ambulatory Visit: Payer: Self-pay

## 2018-03-02 ENCOUNTER — Emergency Department (HOSPITAL_COMMUNITY): Payer: 59

## 2018-03-02 ENCOUNTER — Emergency Department (HOSPITAL_COMMUNITY)
Admission: EM | Admit: 2018-03-02 | Discharge: 2018-03-02 | Disposition: A | Discharge status: Home / Self Care | Payer: 59 | Attending: Emergency Medicine | Admitting: Emergency Medicine

## 2018-03-02 ENCOUNTER — Encounter (HOSPITAL_COMMUNITY): Payer: Self-pay | Admitting: Emergency Medicine

## 2018-03-02 DIAGNOSIS — Z79899 Other long term (current) drug therapy: Secondary | ICD-10-CM | POA: Diagnosis not present

## 2018-03-02 DIAGNOSIS — E039 Hypothyroidism, unspecified: Secondary | ICD-10-CM | POA: Diagnosis not present

## 2018-03-02 DIAGNOSIS — M25511 Pain in right shoulder: Secondary | ICD-10-CM | POA: Diagnosis present

## 2018-03-02 DIAGNOSIS — Z87891 Personal history of nicotine dependence: Secondary | ICD-10-CM | POA: Insufficient documentation

## 2018-03-02 DIAGNOSIS — M5410 Radiculopathy, site unspecified: Secondary | ICD-10-CM | POA: Diagnosis not present

## 2018-03-02 DIAGNOSIS — Z8542 Personal history of malignant neoplasm of other parts of uterus: Secondary | ICD-10-CM | POA: Insufficient documentation

## 2018-03-02 LAB — I-STAT CHEM 8, ED
BUN: 15 mg/dL (ref 8–23)
Calcium, Ion: 1.19 mmol/L (ref 1.15–1.40)
Chloride: 106 mmol/L (ref 98–111)
Creatinine, Ser: 0.7 mg/dL (ref 0.44–1.00)
GLUCOSE: 98 mg/dL (ref 70–99)
HEMATOCRIT: 36 % (ref 36.0–46.0)
HEMOGLOBIN: 12.2 g/dL (ref 12.0–15.0)
POTASSIUM: 3.7 mmol/L (ref 3.5–5.1)
SODIUM: 140 mmol/L (ref 135–145)
TCO2: 28 mmol/L (ref 22–32)

## 2018-03-02 MED ORDER — LORAZEPAM 2 MG/ML IJ SOLN
1.0000 mg | Freq: Once | INTRAMUSCULAR | Status: AC
  Administered 2018-03-02: 1 mg via INTRAVENOUS
  Filled 2018-03-02: qty 1

## 2018-03-02 MED ORDER — GADOBUTROL 1 MMOL/ML IV SOLN: 8.0000 mL | Freq: Once | INTRAVENOUS | Status: DC | PRN

## 2018-03-02 NOTE — ED Notes (Signed)
Ativan held to be given just prior to MRI per EDP.

## 2018-03-02 NOTE — Discharge Instructions (Addendum)
Call Dr. Colleen Can office tomorrow to schedule an office visit.  Tell office staff that you had an MRI scan performed here.  You can take your Percocet as directed.  Call Dr. Laurann Montana or oncologist to discuss possibility of referral to a pain clinic

## 2018-03-02 NOTE — ED Provider Notes (Signed)
[place] DEPT Provider Note   CSN: [id number] Arrival date & time: 03/02/18  0831     History   Chief Complaint Chief Complaint  Patient presents with  . Hand Pain  . Shoulder Pain  . Post-op Problem    HPI Gaylin Osoria is a 67 y.o. female.  HPI patient complains of bilateral shoulder pain.  She complains of right shoulder pain onset approximately 1 month ago.  Worse with movement of her shoulder or rotating her neck.  Complains of left-sided shoulder pain onset 4 or 5 days ago also worse with movement or rotating her neck.  She has no difficulty swallowing.  No fever.  No injury.  No shortness of breath.  No chest pain.  No other associated symptoms.  He did with Percocet, without relief.  He is able to feed herself and brush her teeth.  Is status post left carpal tunnel surgery February 26, 2018 and status post right carpal tunnel surgery November 2019 Past Medical History:  Diagnosis Date  . Anxiety   . Arthritis   . Cancer [facility])    endometrial cancer  . Complication of anesthesia   . Depression   . Family history of uterine cancer   . GERD (gastroesophageal reflux disease)   . History of radiation therapy 11/08/2016-12/27/2016   pelvis 45 Gy in 25 fractions, pelvis boost 14 gy in 7 fractions  . Hypothyroidism    on synthroid   . PONV (postoperative nausea and vomiting)   Reports uterine cancer in remission  Patient Active Problem List   Diagnosis Date Noted  . Neuropathy 01/16/2018  . Vitamin D deficiency 11/11/2017  . Hypothyroid 11/09/2017  . Total body pain 11/09/2017  . Sinusitis 10/11/2017  . Mediastinal lymphadenopathy 10/11/2017  . Skin rash 10/11/2017  . Genetic testing 08/29/2017  . Hypothyroidism 08/08/2017  . 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 Depression 07/30/2017  . Encounter for antineoplastic immunotherapy 07/30/2017  . 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 Groin pain, right 07/30/2017    . Family history of uterine cancer   . Left groin pain   . Endometrial cancer [facility])     Past Surgical History:  Procedure Laterality Date  . CESAREAN SECTION    . COLONOSCOPY    . DIAGNOSTIC LAPAROSCOPY     fibroid tumors on her ovaries   . DILATION AND CURETTAGE OF UTERUS    . HAND SURGERY Right 2019   carpal tunnel  . HYSTEROSCOPY W/D&C N/A 09/15/2014   Procedure: DILATATION AND CURETTAGE /HYSTEROSCOPY/MYOSURE RESECTION OF POLYP;  Surgeon: Janyth Pupa, DO;  Location: [facility];  Service: Gynecology;  Laterality: N/A;  . KNEE SURGERY    . LYMPH NODE DISSECTION N/A 10/26/2014   Procedure: POSS LYMPHADENECTOMY ;  Surgeon: [name], MD;  Location: [facility];  Service: Gynecology;  Laterality: N/A;  . ROBOTIC ASSISTED TOTAL HYSTERECTOMY WITH BILATERAL SALPINGO OOPHERECTOMY Bilateral 10/26/2014   Procedure: ROBOTIC ASSISTED TOTAL HYSTERECTOMY WITH BILATERAL SALPINGO OOPHORECTOMY;  Surgeon: [name], MD;  Location: [facility];  Service: Gynecology;  Laterality: Bilateral;  . TUBAL LIGATION    . WISDOM TOOTH EXTRACTION       OB History   None      Home Medications    Prior to Admission medications   Medication Sig Start Date End Date Taking? Authorizing Provider  acetaminophen (TYLENOL) 325 MG tablet Take 650 mg by mouth every 6 (six) hours as needed for moderate pain.    [provider]  Calcium Carbonate-Vitamin D (CALCIUM-D PO) Take 1 tablet by mouth daily.    [provider]  FLUoxetine (PROZAC) 40 MG capsule Take 40 mg by mouth daily.    [provider]  levothyroxine (SYNTHROID, LEVOTHROID) 100 MCG tablet Take 112 mcg by mouth daily before breakfast.     [provider]  pantoprazole (PROTONIX) 40 MG tablet Take 1 tablet (40 mg total) by mouth daily before breakfast. While taking naproxen. Any generic PPI is okay. 11/10/17   Mendel Corning, MD    Family History Family History  Problem Relation Age of Onset  . Breast cancer Maternal Aunt         dx late 50's/60's, died in 7's  . Uterine cancer Mother 31  . Heart failure Mother   . Dementia Father   . Heart attack Paternal Grandfather 68  . Other Sister 69       had abnromal finding on mammogram, unsure if cancer. Was told to 'remove part of of body'    Social History Social History   Tobacco Use  . Smoking status: Former Smoker    Packs/day: 0.50    Years: 13.00    Pack years: 6.50    Types: Cigarettes    Last attempt to quit: 10/20/2007    Years since quitting: 10.3  . Smokeless tobacco: Never Used  Substance Use Topics  . Alcohol use: No  . Drug use: No     Allergies   Hydrocodone   Review of Systems Review of Systems  Constitutional: Negative.   HENT: Negative.   Respiratory: Negative.   Cardiovascular: Negative.   Gastrointestinal: Negative.   Musculoskeletal: Positive for arthralgias.       Reports diffuse arthralgias since immunotherapy August 2019.  Skin: Negative.   Neurological: Negative.   Psychiatric/Behavioral: Negative.   All other systems reviewed and are negative.    Physical Exam Updated Vital Signs BP 120/75   Pulse 81   Temp 98.4 F (36.9 C)   Resp 15   SpO2 96%   Physical Exam  Constitutional: She appears well-developed and well-nourished.  HENT:  Head: Normocephalic and atraumatic.  Eyes: Pupils are equal, round, and reactive to light. Conjunctivae are normal.  Neck: Neck supple. No tracheal deviation present. No thyromegaly present.  Full range of motion.  She has pain on rotating her neck from side to side pain radiates to both shoulders down to both arms on rotating her neck from side to side.  Cardiovascular: Normal rate and regular rhythm.  No murmur heard. Pulmonary/Chest: Effort normal and breath sounds normal.  Abdominal: Soft. Bowel sounds are normal. She exhibits no distension. There is no tenderness.  Musculoskeletal: Normal range of motion. She exhibits no edema or tenderness.  Left upper extremity in  soft cast.  Digits with good capillary refill, tender at shoulder.  No deformity or redness or swelling limited range of motion of shoulder secondary to pain.  Right upper extremity without redness or swelling.  Good capillary refill.  Radial pulse 2+.  Limited range of motion of shoulder secondary to pain.  Bilateral lower extremities without redness swelling or tenderness, neurovascularly intact  Neurological: She is alert. Coordination normal.  Moves all extremities cranial nerves II through XII grossly intact.  DTRs symmetric bilaterally at knee jerk ankle jerk and biceps toes downgoing bilaterally gait normal  Skin: Skin is warm and dry. No rash noted.  Psychiatric: She has a normal mood and affect.  Nursing note and vitals reviewed.  ED Treatments / Results  Labs (all labs ordered are listed, but only abnormal results are displayed) Labs Reviewed - No data to display Results for orders placed or performed during the hospital encounter of 03/02/18  I-stat chem 8, ed  Result Value Ref Range   Sodium 140 135 - 145 mmol/L   Potassium 3.7 3.5 - 5.1 mmol/L   Chloride 106 98 - 111 mmol/L   BUN 15 8 - 23 mg/dL   Creatinine, Ser 0.70 0.44 - 1.00 mg/dL   Glucose, Bld 98 70 - 99 mg/dL   Calcium, Ion 1.19 1.15 - 1.40 mmol/L   TCO2 28 22 - 32 mmol/L   Hemoglobin 12.2 12.0 - 15.0 g/dL   HCT 36.0 36.0 - 46.0 %   Ct Chest W Contrast  Result Date: 02/21/2018 CLINICAL DATA:  67 year old with history of endometrial cancer diagnosed in 2016 status post hysterectomy and radiation therapy, completed in October 2018. EXAM: CT CHEST, ABDOMEN, AND PELVIS WITH CONTRAST TECHNIQUE: Multidetector CT imaging of the chest, abdomen and pelvis was performed following the standard protocol during bolus administration of intravenous contrast. CONTRAST:  162m OMNIPAQUE IOHEXOL 300 MG/ML  SOLN COMPARISON:  CT the chest, abdomen and pelvis 11/28/2017. FINDINGS: CT CHEST FINDINGS Cardiovascular: Heart size is  normal. There is no significant pericardial fluid, thickening or pericardial calcification. No atherosclerotic calcifications in the thoracic aorta or the coronary arteries. Mediastinum/Nodes: No pathologically enlarged mediastinal or hilar lymph nodes. Esophagus is unremarkable in appearance. No axillary lymphadenopathy. Lungs/Pleura: No suspicious appearing pulmonary nodules or masses are noted. No acute consolidative airspace disease. No pleural effusions. Scattered areas of septal thickening and focal architectural distortion in the periphery of the right lung, most compatible with areas of chronic post infectious or inflammatory scarring. Musculoskeletal: There are no aggressive appearing lytic or blastic lesions noted in the visualized portions of the skeleton. CT ABDOMEN PELVIS FINDINGS Hepatobiliary: 1.0 x 1.5 cm low-attenuation lesion in segment 7 of the liver, compatible with a simple cyst. No other suspicious hepatic lesions. No intra or extrahepatic biliary ductal dilatation. Gallbladder is normal in appearance. Pancreas: No pancreatic mass. No pancreatic ductal dilatation. No pancreatic or peripancreatic fluid or inflammatory changes. Spleen: Unremarkable. Adrenals/Urinary Tract: Bilateral kidneys and bilateral adrenal glands are normal in appearance. No hydroureteronephrosis. Urinary bladder is normal in appearance. Stomach/Bowel: Normal appearance of the stomach. No pathologic dilatation of small bowel or colon. Several colonic diverticulae are noted, particularly in the sigmoid colon, without surrounding inflammatory changes to suggest an acute diverticulitis at this time. Normal appendix. Vascular/Lymphatic: Aortic atherosclerosis, without evidence of aneurysm or dissection noted in the abdominal or pelvic vasculature. Reproductive: Status post total abdominal hysterectomy and bilateral salpingo-oophorectomy. No unexpected soft tissue lesion in the low anatomic pelvis to suggest locally recurrent  disease. Other: No significant volume of ascites.  No pneumoperitoneum. Musculoskeletal: There are no aggressive appearing lytic or blastic lesions noted in the visualized portions of the skeleton. IMPRESSION: 1. No findings to suggest metastatic disease in the chest, abdomen or pelvis. 2. Mild colonic diverticulosis without evidence of acute diverticulitis at this time. 3. Aortic atherosclerosis. 4. Additional incidental findings, as above. Electronically Signed   By: DVinnie LangtonM.D.   On: 02/21/2018 14:13   Mr Cervical Spine Wo Contrast  Result Date: 03/02/2018 CLINICAL DATA:  Pt had carpal tunnel surgery on right hand November and left hand December 4th. Pt still having severe pains in right arm since surgery and now starting in left shouler. EXAM: MRI CERVICAL SPINE  WITHOUT CONTRAST TECHNIQUE: Multiplanar, multisequence MR imaging of the cervical spine was performed. No intravenous contrast was administered. COMPARISON:  None. FINDINGS: Significant patient motion throughout the examination degrading image quality and limiting evaluation. Alignment: Physiologic. Vertebrae: No fracture, evidence of discitis, or bone lesion. Cord: Normal signal and morphology. Posterior Fossa, vertebral arteries, paraspinal tissues: Posterior fossa demonstrates no focal abnormality. Vertebral artery flow voids are maintained. Paraspinal soft tissues are unremarkable. Disc levels: Discs: Degenerative disc disease with disc height loss at C5-6. C2-3: No significant disc bulge. No neural foraminal stenosis. No central canal stenosis. C3-4: No significant disc bulge. No neural foraminal stenosis. No central canal stenosis. C4-5: No significant disc bulge. No neural foraminal stenosis. No central canal stenosis. C5-6: Evaluation is limited secondary to patient motion degrading image quality. Possible small central disc protrusion which may contact the ventral cervical spinal cord. Mild bilateral uncovertebral degenerative  changes. Mild bilateral foraminal stenosis. No central canal stenosis. C6-7: No significant disc bulge. No neural foraminal stenosis. No central canal stenosis. C7-T1: No significant disc bulge. No neural foraminal stenosis. No central canal stenosis. IMPRESSION: 1. Significant patient motion throughout the examination degrading image quality and limiting evaluation. Possible small central disc protrusion which may contact the ventral cervical spinal cord. Mild bilateral uncovertebral degenerative changes. Mild bilateral foraminal stenosis. Electronically Signed   By: Kathreen Devoid   On: 03/02/2018 11:53   Ct Abdomen Pelvis W Contrast  Result Date: 02/21/2018 CLINICAL DATA:  67 year old with history of endometrial cancer diagnosed in 2016 status post hysterectomy and radiation therapy, completed in October 2018. EXAM: CT CHEST, ABDOMEN, AND PELVIS WITH CONTRAST TECHNIQUE: Multidetector CT imaging of the chest, abdomen and pelvis was performed following the standard protocol during bolus administration of intravenous contrast. CONTRAST:  149m OMNIPAQUE IOHEXOL 300 MG/ML  SOLN COMPARISON:  CT the chest, abdomen and pelvis 11/28/2017. FINDINGS: CT CHEST FINDINGS Cardiovascular: Heart size is normal. There is no significant pericardial fluid, thickening or pericardial calcification. No atherosclerotic calcifications in the thoracic aorta or the coronary arteries. Mediastinum/Nodes: No pathologically enlarged mediastinal or hilar lymph nodes. Esophagus is unremarkable in appearance. No axillary lymphadenopathy. Lungs/Pleura: No suspicious appearing pulmonary nodules or masses are noted. No acute consolidative airspace disease. No pleural effusions. Scattered areas of septal thickening and focal architectural distortion in the periphery of the right lung, most compatible with areas of chronic post infectious or inflammatory scarring. Musculoskeletal: There are no aggressive appearing lytic or blastic lesions noted in  the visualized portions of the skeleton. CT ABDOMEN PELVIS FINDINGS Hepatobiliary: 1.0 x 1.5 cm low-attenuation lesion in segment 7 of the liver, compatible with a simple cyst. No other suspicious hepatic lesions. No intra or extrahepatic biliary ductal dilatation. Gallbladder is normal in appearance. Pancreas: No pancreatic mass. No pancreatic ductal dilatation. No pancreatic or peripancreatic fluid or inflammatory changes. Spleen: Unremarkable. Adrenals/Urinary Tract: Bilateral kidneys and bilateral adrenal glands are normal in appearance. No hydroureteronephrosis. Urinary bladder is normal in appearance. Stomach/Bowel: Normal appearance of the stomach. No pathologic dilatation of small bowel or colon. Several colonic diverticulae are noted, particularly in the sigmoid colon, without surrounding inflammatory changes to suggest an acute diverticulitis at this time. Normal appendix. Vascular/Lymphatic: Aortic atherosclerosis, without evidence of aneurysm or dissection noted in the abdominal or pelvic vasculature. Reproductive: Status post total abdominal hysterectomy and bilateral salpingo-oophorectomy. No unexpected soft tissue lesion in the low anatomic pelvis to suggest locally recurrent disease. Other: No significant volume of ascites.  No pneumoperitoneum. Musculoskeletal: There are no aggressive appearing lytic  or blastic lesions noted in the visualized portions of the skeleton. IMPRESSION: 1. No findings to suggest metastatic disease in the chest, abdomen or pelvis. 2. Mild colonic diverticulosis without evidence of acute diverticulitis at this time. 3. Aortic atherosclerosis. 4. Additional incidental findings, as above. Electronically Signed   By: Vinnie Langton M.D.   On: 02/21/2018 14:13   EKG None  Radiology No results found.  Procedures Procedures (including critical care time)  Medications Ordered in ED Medications - No data to display  MRI scan of cervical spine ordered with contrast,  in light of cervical radiculopathy Initial Impression / Assessment and Plan / ED Course  I have reviewed the triage vital signs and the nursing notes.  Pertinent labs & imaging results that were available during my care of the patient were reviewed by me and considered in my medical decision making (see chart for details).     RI scan discussed with nurse practitioner meyran for neurosurgical service.  And she she is referred to neurosurgeon Dr. Perfecto Kingdom for office visit.  Patient reports that she has Percocet at home to take for pain.  I suggested that she contact her primary care physician or oncologist for referral to a pain clinic has had chronic diffuse pain lasting several months  Final Clinical Impressions(s) / ED Diagnoses  Diagnosis cervical radiculopathy Final diagnoses:  None    ED Discharge Orders    None       Orlie Dakin, MD 03/02/18 1348

## 2018-03-02 NOTE — ED Notes (Signed)
Pt able to ambulate to restroom and to lobby. Denies need for wheelchair.

## 2018-03-02 NOTE — ED Notes (Signed)
Patient trying to call family for a ride home.

## 2018-03-02 NOTE — ED Triage Notes (Signed)
Pt had carpal tunnel surgery on right hand November and left hand December 4th. Pt still having severe pains in right arm since surgery and now starting in left shouler.

## 2018-03-02 NOTE — ED Notes (Signed)
Patient transported to MRI 

## 2018-03-03 ENCOUNTER — Other Ambulatory Visit: Payer: Self-pay | Admitting: Hematology and Oncology

## 2018-03-03 ENCOUNTER — Telehealth: Payer: Self-pay

## 2018-03-03 MED ORDER — OXYCODONE-ACETAMINOPHEN 5-325 MG PO TABS: 1.0000 | ORAL_TABLET | Freq: Four times a day (QID) | ORAL | 0 refills | Status: DC

## 2018-03-03 NOTE — Telephone Encounter (Signed)
I recommend pain medication for now Since she has appt with neurologist we will wait to hear the recommendation from the specialist first

## 2018-03-03 NOTE — Telephone Encounter (Signed)
She called and left a message to call her.  Called back. She went to the ER yesterday. She is unable lift both arms, right is worse than left. She had MRI yesterday, is was a closed MRI and she kept moving. She needs a open MRI. She recently had right and left carpal tunnel surgery. She has appt with a neurologist this Thursday. She asking for appt or some direction on what she should do.

## 2018-03-03 NOTE — Telephone Encounter (Signed)
Called back and given below message. She verbalized understanding. She is asking for a refill on the Percocet Rx. She has one tablet left.

## 2018-03-03 NOTE — Telephone Encounter (Signed)
I sent to local phramcy

## 2018-03-03 NOTE — Telephone Encounter (Signed)
Called her and told Rx sent to pharmacy.

## 2018-03-05 ENCOUNTER — Other Ambulatory Visit: Payer: Self-pay | Admitting: Obstetrics

## 2018-03-05 DIAGNOSIS — C7800 Secondary malignant neoplasm of unspecified lung: Secondary | ICD-10-CM

## 2018-03-05 DIAGNOSIS — C541 Malignant neoplasm of endometrium: Secondary | ICD-10-CM

## 2018-03-06 ENCOUNTER — Telehealth: Payer: Self-pay

## 2018-03-06 NOTE — Telephone Encounter (Signed)
I can see her tomorrow if she wants to discuss this at 1115 am, 30 mins

## 2018-03-06 NOTE — Telephone Encounter (Signed)
She called and left a message to call her back.  Called back. She is asking for Dr. Alvy Bimler to call her or could she have appt? She would like to talk about her joint issues and the pain she has been having since September. She is worried about going back to work and if she can really do her job.

## 2018-03-06 NOTE — Telephone Encounter (Signed)
Called back and gave below message. She will take appt.

## 2018-03-07 ENCOUNTER — Inpatient Hospital Stay (HOSPITAL_BASED_OUTPATIENT_CLINIC_OR_DEPARTMENT_OTHER): Payer: 59 | Admitting: Hematology and Oncology

## 2018-03-07 ENCOUNTER — Other Ambulatory Visit: Payer: Self-pay | Admitting: Family Medicine

## 2018-03-07 ENCOUNTER — Inpatient Hospital Stay: Payer: 59

## 2018-03-07 ENCOUNTER — Telehealth: Payer: Self-pay | Admitting: Hematology and Oncology

## 2018-03-07 ENCOUNTER — Encounter: Payer: Self-pay | Admitting: Hematology and Oncology

## 2018-03-07 ENCOUNTER — Ambulatory Visit: Payer: 59

## 2018-03-07 ENCOUNTER — Other Ambulatory Visit: Payer: Self-pay | Admitting: Internal Medicine

## 2018-03-07 VITALS — BP 124/57 | HR 73 | Temp 98.4°F | Resp 17 | Ht 61.75 in | Wt 180.0 lb

## 2018-03-07 DIAGNOSIS — C541 Malignant neoplasm of endometrium: Secondary | ICD-10-CM

## 2018-03-07 DIAGNOSIS — F329 Major depressive disorder, single episode, unspecified: Secondary | ICD-10-CM

## 2018-03-07 DIAGNOSIS — C7800 Secondary malignant neoplasm of unspecified lung: Secondary | ICD-10-CM | POA: Diagnosis not present

## 2018-03-07 DIAGNOSIS — E038 Other specified hypothyroidism: Secondary | ICD-10-CM

## 2018-03-07 DIAGNOSIS — R52 Pain, unspecified: Secondary | ICD-10-CM

## 2018-03-07 DIAGNOSIS — Z90722 Acquired absence of ovaries, bilateral: Secondary | ICD-10-CM

## 2018-03-07 DIAGNOSIS — Z9221 Personal history of antineoplastic chemotherapy: Secondary | ICD-10-CM

## 2018-03-07 DIAGNOSIS — Z1231 Encounter for screening mammogram for malignant neoplasm of breast: Secondary | ICD-10-CM

## 2018-03-07 DIAGNOSIS — Z90710 Acquired absence of both cervix and uterus: Secondary | ICD-10-CM | POA: Diagnosis not present

## 2018-03-07 DIAGNOSIS — Z87891 Personal history of nicotine dependence: Secondary | ICD-10-CM

## 2018-03-07 LAB — CBC WITH DIFFERENTIAL (CANCER CENTER ONLY)
Abs Immature Granulocytes: 0.02 10*3/uL (ref 0.00–0.07)
Basophils Absolute: 0.1 10*3/uL (ref 0.0–0.1)
Basophils Relative: 1 %
EOS ABS: 0.3 10*3/uL (ref 0.0–0.5)
EOS PCT: 6 %
HCT: 37.4 % (ref 36.0–46.0)
Hemoglobin: 12.4 g/dL (ref 12.0–15.0)
Immature Granulocytes: 0 %
Lymphocytes Relative: 12 %
Lymphs Abs: 0.6 10*3/uL — ABNORMAL LOW (ref 0.7–4.0)
MCH: 30.5 pg (ref 26.0–34.0)
MCHC: 33.2 g/dL (ref 30.0–36.0)
MCV: 91.9 fL (ref 80.0–100.0)
MONO ABS: 0.5 10*3/uL (ref 0.1–1.0)
Monocytes Relative: 11 %
Neutro Abs: 3.2 10*3/uL (ref 1.7–7.7)
Neutrophils Relative %: 70 %
Platelet Count: 260 10*3/uL (ref 150–400)
RBC: 4.07 MIL/uL (ref 3.87–5.11)
RDW: 13.2 % (ref 11.5–15.5)
WBC Count: 4.6 10*3/uL (ref 4.0–10.5)
nRBC: 0 % (ref 0.0–0.2)

## 2018-03-07 LAB — CMP (CANCER CENTER ONLY)
ALT: 14 U/L (ref 0–44)
AST: 13 U/L — ABNORMAL LOW (ref 15–41)
Albumin: 3.8 g/dL (ref 3.5–5.0)
Alkaline Phosphatase: 52 U/L (ref 38–126)
Anion gap: 9 (ref 5–15)
BUN: 9 mg/dL (ref 8–23)
CO2: 27 mmol/L (ref 22–32)
Calcium: 9.3 mg/dL (ref 8.9–10.3)
Chloride: 108 mmol/L (ref 98–111)
Creatinine: 0.79 mg/dL (ref 0.44–1.00)
GFR, Est AFR Am: >60 mL/min (ref >60)
Glucose, Bld: 91 mg/dL (ref 70–99)
Potassium: 4.4 mmol/L (ref 3.5–5.1)
Sodium: 144 mmol/L (ref 135–145)
Total Bilirubin: 0.5 mg/dL (ref 0.3–1.2)
Total Protein: 6.6 g/dL (ref 6.5–8.1)

## 2018-03-07 LAB — C-REACTIVE PROTEIN: CRP: <0.8 mg/dL (ref <1.0)

## 2018-03-07 LAB — TSH: TSH: 0.801 u[IU]/mL (ref 0.308–3.960)

## 2018-03-07 LAB — SEDIMENTATION RATE: Sed Rate: 34 mm/hr — ABNORMAL HIGH (ref 0–22)

## 2018-03-07 MED ORDER — PREDNISONE 20 MG PO TABS: 60.0000 mg | ORAL_TABLET | Freq: Every day | ORAL | 0 refills | Status: DC

## 2018-03-07 NOTE — Progress Notes (Signed)
[facility] OFF[facility] PROGRESS NOTE  Patient Care Team: [name], MD as PCP - General (Internal Medicine) Jacelyn Pi, MD as Consulting Physician (Endocrinology) Heath Lark, MD as Consulting Physician (Hematology and Oncology)  ASSESSMENT & PLAN:  Endometrial cancer [facility]) Recent CT imaging showed no evidence of active cancer in her abdomen or lungs However, the patient had significant diffuse bone pain throughout I recommend bone scan to evaluate and to exclude bony metastasis I will see her back next week for further follow-up  Total body pain She has severe, out of proportion diffuse bone pain, worrisome for malignancy It appears to be also a component of inflammatory arthritis Unfortunately, appointment to see rheumatologist is pending I recommend prednisone therapy that seems to work well for her in the past 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 Today, I will order inflammatory markers and autoimmune work-up If her pain is not improved with prednisone, I will have to prescribe regular narcotic prescriptions  Hypothyroidism She has acquired hypothyroidism and is currently on high-dose thyroid replacement therapy I plan to recheck her thyroid function again and will adjust the medication as needed The patient is complaining of profound fatigue and recent weight gain  Depression She is profoundly depressed due to her medical condition She is currently taking antidepressant It could be exacerbated by pain and recent findings of abnormal thyroid function She will continue her treatment for now She denies suicidal ideation   Orders Placed This Encounter  Procedures  . NM Bone Scan Whole Body    Standing Status:   Future    Standing Expiration Date:   03/07/2019    Order Specific Question:   If indicated for the ordered procedure, I authorize the administration of a radiopharmaceutical per Radiology protocol    Answer:    Yes    Order Specific Question:   Preferred imaging location?    Answer:   [facility]    Order Specific Question:   Radiology Contrast Protocol - do NOT remove file path    Answer:   [email or web address]  . Sedimentation rate    Standing Status:   Future    Number of Occurrences:   1    Standing Expiration Date:   09/06/2019  . C-reactive protein    Standing Status:   Future    Number of Occurrences:   1    Standing Expiration Date:   09/06/2019  . Cyclic Citrul Peptide Ab, IgG, IgA    Standing Status:   Future    Number of Occurrences:   1    Standing Expiration Date:   09/06/2019  . ANA, IFA (with reflex)    Standing Status:   Future    Number of Occurrences:   1    Standing Expiration Date:   09/06/2019  . Rheumatoid factor    Standing Status:   Future    Number of Occurrences:   1    Standing Expiration Date:   09/06/2019  . VITAMIN D 25 Hydroxy (Vit-D Deficiency, Fractures)    Standing Status:   Future    Number of Occurrences:   1    Standing Expiration Date:   09/06/2019    INTERVAL HISTORY: Please see below for problem oriented charting. She is being seen urgently due to unresolved diffuse bone pain and worrisome for cancer Since last time I saw her, she had recent CT scan of the chest, abdomen and pelvis which showed no evidence of cancer recurrence  However, she has significant diffuse pain throughout She has completed carpal tunnel surgery bilaterally for neuropathy but overall, her symptoms does not improve She saw a neurologist with negative MRI of her cervical spine She is being referred to see rheumatologist She has significant pain in both shoulders, knees and her right foot The pain is so debilitating despite being prescribed oxycodone She is not able to function or work She is depressed and tearful due to her non-resolving symptoms She denies recent infection, fever or chills  SUMMARY OF ONCOLOGIC HISTORY: Oncology History    Abnormal MSI, germline mutation was negative Endometrioid     Endometrial cancer ([place])   09/15/2014 Pathology Results    Endometrium, curettage - ENDOMETRIAL ADENOCARCINOMA. - SEE COMMENT. Microscopic Comment Although definitive characterization is best performed on resection specimen, as sampled, the endometrial adenocarcinoma appears to be endometrioid subtype, FIGO grade 1.     09/15/2014 Surgery    PreOp: postmenopausal bleeding, cervical stenosis PostOp: same and uterine polyp Procedure:  Hysteroscopy, Dilation and Curettage, Myosure polypectomy Surgeon: Dr. Janyth Pupa  Findings:8cm uterus with thickened polypoid-like endometrium, large 2cm irregular appearing polyp  Specimens: 1) endometrial curettings    10/26/2014 Pathology Results    1. Uterus +/- tubes/ovaries, neoplastic - INVASIVE ADENOCARCINOMA, ENDOMETRIOID TYPE, SEE COMMENT. - TUMOR INVOLVES LESS THAN ONE HALF MYOMETRIAL THICKNESS. - TUMOR INVOLVES UTERINE ADENOMYOSIS. - BENIGN LEIOMYOMATA (UP TO 2.5 CM). - BENIGN CERVICAL MUCOSA; NEGATIVE FOR INTRAEPITHELIAL LESION OR MALIGNANCY. - BENIGN RIGHT AND LEFT OVARIES; NEGATIVE FOR ATYPIA OR MALIGNANCY. - BENIGN RIGHT AND LEFT FALLOPIAN TUBE; NEGATIVE FOR ATYPIA OR MALIGNANCY. - BENIGN PARATUBAL CYSTS; NEGATIVE FOR ATYPIA OR MALIGNANCY. - SEE TUMOR SYNOPTIC TEMPLATE BELOW. 2. Lymph nodes, regional resection, right pelvic - FOUR LYMPH NODES, NEGATIVE FOR TUMOR (0/4). 3. Lymph nodes, regional resection, left pelvic - FOUR LYMPH NODES, NEGATIVE FOR TUMOR (0/4). Microscopic Comment 1. ONCOLOGY TABLE-UTERUS, CARCINOMA OR CARCINOSARCOMA Specimen: Uterus and bilateral fallopian tubes and ovaries. Procedure: Hysterectomy and bilateral salpingo-oophorectomy. Lymph node sampling performed: Yes. Specimen integrity: Intact. Maximum tumor size: 4.0 cm (tumor involved entire endometrium) Histologic type: Adenocarcinoma, endometrioid type. Grade: 1 Myometrial invasion: 0.5 cm  where myometrium is 1.5 cm in thickness Cervical stromal involvement: Absent. Extent of involvement of other organs: None Lymph - vascular invasion: Absent. Peritoneal washings: N/A Lymph nodes: # examined - 8 ; # positive - 0 Pelvic lymph nodes: N/A involved of N/A lymph nodes. Para-aortic lymph nodes: N/A involved of N/A lymph nodes. Other (specify involvement and site): N/A TNM code: pT1a, pN0 FIGO Stage (based on pathologic findings, needs clinical correlation): N/A Comments: None MSI testing abnormal    10/26/2014 Surgery    Surgery: Total robotic hysterectomy, bilateral salpingo-oophorectomy, bilateral pelvic lymph node dissection.   Surgeons:  Lucita Lora. Alycia Rossetti, MD; Lahoma Crocker, MD   Pathology: Uterus, cervix, bilateral tubes and ovaries, bilateral pelvic lymph nodes to pathology.   Operative findings: Omentum adherent to midline vertical incision. Fibroid uterus. Normal adnexa. Adhesions of anterior bladder to uterus. Frozen section with grade 1 cancer focally involving adenomyosis, minimal myometrial invasion.    11/17/2014 Imaging    Ct abdomen 1. Status post hysterectomy. Pelvic edema which is greater than typically seen 3 weeks postop. Suspicious for postoperative infection. 2. Bladder wall thickening and irregularity. Although this could be partially due to underdistention, Concurrent cystitis cannot be excluded. 3. Bilateral fluid density lesions along the pelvic sidewalls. Favored to represent seromas or lymphangiomas. No specific features to suggest abscess. If the patient's symptoms persist  after appropriate antibiotic therapy, aspiration of the largest left pelvic sidewall "Lesion" should be considered. 4. Suspicion of mild hepatic steatosis. Indeterminate right hepatic lobe lesion. If definitive characterization is desired in this patient with history of primary malignancy, nonemergent pre and post contrast abdominal MRI could be performed. 5. Hiatal hernia.     11/25/2014 Imaging    1. Persistent bilateral pelvic sidewall fluid collections, left greater than right, with left-sided inflammatory changes. 2. Technically successful 12 French left pelvic abscess drain catheter placement. A sample of the aspirate was sent for Gram stain, culture and sensitivity.    12/08/2014 Imaging    CT abdomen and pelvis 1. Complete resolution of dominant fluid collection within the left hemipelvis following percutaneous drainage catheter placement. This percutaneous drainage catheter was subsequent removed intact at the patient's bedside. 2. Continued decrease in size of bilobed fluid collections within the right hemipelvis with superficial component measuring 1.6 cm and posterior component measuring 2.6 cm, indeterminate though both favored to represent evolving seromas. 3. Unchanged indeterminate approximately 1.2 cm hepatic lesion. Further evaluation with contrast-enhanced abdominal MRI could be performed as clinically indicated. 4. Colonic diverticulosis without evidence of diverticulitis.    10/19/2016 Imaging    New 2.6 cm soft tissue mass involving right vaginal cuff, consistent with locally recurrent carcinoma.  No other sites of metastatic disease identified.  Colonic diverticulosis, without radiographic evidence of diverticulitis.  Mild hepatic steatosis.     10/24/2016 Relapse/Recurrence    Vaginal cuff recurrence. No evidence of distant disease.    10/24/2016 Pathology Results    Vagina, biopsy, right apex - ADENOCARCINOMA - SEE COMMENT Microscopic Comment The morphologic features are consistent with the patient's previously diagnosed endometrioid adenocarcinoma    11/08/2016 - 12/27/2016 Radiation Therapy    45 Gy in 25 fractions of 1.8 Gy. The residual pelvic mass was boosted to 14 Gy in 7 fractions of 2 Gy. No intracavitary lesion seen at completion of pelvic fields to boost with brachytherapy.    06/12/2017 Imaging    CT abdomen and pelvis 1.  New 1.1 by 0.8 cm right lower lobe pulmonary nodule, concerning for pulmonary metastatic disease. Possibilities for further assessment include biopsy or nuclear medicine PET-CT to assess this lesion and the rest of the neck/chest/abdomen/pelvis for other potential hypermetabolic lesions. 2. The previous soft tissue mass along the right vaginal cuff is no longer present. 3. Stable and likely benign right hepatic lobe hypodense lesion. 4. Other imaging findings of potential clinical significance: Sigmoid colon diverticulosis. Aortic Atherosclerosis (ICD10-I70.0). Multilevel lumbar degenerative disc disease. Mild cardiomegaly. Small type 1 hiatal hernia.    07/05/2017 Imaging    Ct chest Several bilateral pulmonary nodules measuring up to 11 mm, highly suspicious for pulmonary metastases.   No evidence of lymphadenopathy or pleural effusion.  Aortic Atherosclerosis (ICD10-I70.0).    07/25/2017 Imaging    Status post CT-guided lung nodule biopsy. Tissue specimen sent to pathology for complete histopathologic analysis.    07/25/2017 Pathology Results    Lung, needle/core biopsy(ies), RLL - METASTATIC ADENOCARCINOMA - SEE COMMENT Microscopic Comment By immunohistochemistry, the neoplastic cells are positive for Pax-8 and ER but negative for TTF-1. Overall, this immunoprofile is consistent with metastasis from the patient's known endometrioid adenocarcinoma    07/30/2017 Cancer Staging    Staging form: Corpus Uteri - Carcinoma and Carcinosarcoma, AJCC 8th Edition - Clinical: Stage IVB (rcT1, cN0, pM1) - Signed by Heath Lark, MD on 07/30/2017    08/09/2017 - 11/01/2017 Chemotherapy    The patient  had pembrolizumab for chemotherapy treatment.     10/10/2017 Imaging    1. Interval decrease in size and contour. The of previously described right and left lower lobe pulmonary nodules. 2. Interval increase in size of mediastinal and hilar adenopathy, potentially metastatic in etiology. 3. Aortic  Atherosclerosis (ICD10-I70.0).    11/08/2017 - 11/10/2017 Hospital Admission    She was admitted to the hospital due to uncontrolled pain    11/29/2017 Imaging    Chest Impression:  No thoracic metastasis. No discrete pulmonary nodules identified.  Abdomen / Pelvis Impression:  No evidence of local recurrence or metastatic endometrial carcinoma in the abdomen pelvis.    02/21/2018 Imaging    1. No findings to suggest metastatic disease in the chest, abdomen or pelvis. 2. Mild colonic diverticulosis without evidence of acute diverticulitis at this time. 3. Aortic atherosclerosis. 4. Additional incidental findings, as above.     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      Metastasis to lung ([place])   07/30/2017 Initial Diagnosis    Metastasis to lung ([place])    08/02/2017 -  Chemotherapy    The patient had pembrolizumab (KEYTRUDA) 200 mg in sodium chloride 0.9 % 50 mL chemo infusion, 200 mg, Intravenous, Once, 5 of 7 cycles Administration: 200 mg (08/09/2017), 200 mg (08/30/2017), 200 mg (09/20/2017), 200 mg (10/11/2017), 200 mg (11/01/2017)  for chemotherapy treatment.      REVIEW OF SYSTEMS:   Constitutional: Denies fevers, chills or abnormal weight loss Eyes: Denies blurriness of vision Ears, nose, mouth, throat, and face: Denies mucositis or sore throat Respiratory: Denies cough, dyspnea or wheezes Cardiovascular: Denies palpitation, chest discomfort or lower extremity swelling Gastrointestinal:  Denies nausea, heartburn or change in bowel habits Skin: Denies abnormal skin rashes Lymphatics: Denies new  lymphadenopathy or easy bruising Neurological:Denies numbness, tingling or new weaknesses Behavioral/Psych: Mood is stable, no new changes  All other systems were reviewed with the patient and are negative.  I have reviewed the past medical history, past surgical history, social history and family history with the patient and they are unchanged from previous note.  ALLERGIES:  is allergic to hydrocodone.  MEDICATIONS:  Current Outpatient Medications  Medication Sig Dispense Refill  . FLUoxetine (PROZAC) 40 MG capsule Take 40 mg by mouth daily.    Marland Kitchen levothyroxine (SYNTHROID, LEVOTHROID) 112 MCG tablet Take 112 mcg by mouth daily before breakfast.     . oxyCODONE-acetaminophen (PERCOCET/ROXICET) 5-325 MG tablet Take 1 tablet by mouth every 6 (six) hours. 60 tablet 0  . predniSONE (DELTASONE) 20 MG tablet Take 3 tablets (60 mg total) by mouth daily with breakfast. 90 tablet 0   No current facility-administered medications for this visit.     PHYSICAL EXAMINATION: ECOG PERFORMANCE STATUS: 2 - Symptomatic, <50% confined to bed  Vitals:   03/07/18 1127  BP: (!) 124/57  Pulse: 73  Resp: 17  Temp: 98.4 F (36.9 C)  SpO2: 99%   Filed Weights   03/07/18 1127  Weight: 180 lb (81.6 kg)    GENERAL:alert, no distress and comfortable SKIN: skin color, texture, turgor are normal, no rashes or significant lesions  Musculoskeletal:no cyanosis of digits and no clubbing  NEURO: alert & oriented x 3 with fluent speech, no focal motor/sensory deficits  LABORATORY DATA:  I have reviewed the data as listed    Component Value Date/Time   NA 140 03/02/2018 0949   K 3.7 03/02/2018 0949   CL 106 03/02/2018 0949   CO2 25 02/21/2018 1115   GLUCOSE 98 03/02/2018 0949   BUN 15 03/02/2018 0949   BUN 18.7 10/17/2016 1353   CREATININE 0.70 03/02/2018 0949   CREATININE 0.78 02/21/2018 1115   CREATININE 0.8 10/17/2016 1353   CALCIUM 9.3 02/21/2018 1115   PROT 6.5 02/21/2018 1115   ALBUMIN 3.7  02/21/2018 1115   AST 15 02/21/2018 1115   ALT 17 02/21/2018 1115   ALKPHOS 53 02/21/2018 1115   BILITOT 0.7 02/21/2018 1115   GFRNONAA >60 02/21/2018 1115   GFRAA >60 02/21/2018 1115    No results found for: SPEP, UPEP  Lab Results  Component Value Date   WBC 4.2 02/21/2018   NEUTROABS 2.8 02/21/2018   HGB 12.2 03/02/2018   HCT 36.0 03/02/2018   MCV 90.8 02/21/2018   PLT 261 02/21/2018      Chemistry      Component Value Date/Time   NA 140 03/02/2018 0949   K 3.7 03/02/2018 0949   CL 106 03/02/2018 0949   CO2 25 02/21/2018 1115   BUN 15 03/02/2018 0949   BUN 18.7 10/17/2016 1353   CREATININE 0.70 03/02/2018 0949   CREATININE 0.78 02/21/2018 1115   CREATININE 0.8 10/17/2016 1353      Component Value Date/Time   CALCIUM 9.3 02/21/2018 1115   ALKPHOS 53 02/21/2018 1115   AST 15 02/21/2018 1115   ALT 17 02/21/2018 1115   BILITOT 0.7 02/21/2018 1115       RADIOGRAPHIC STUDIES: I have personally reviewed the radiological images as listed and agreed with the findings in the report. Ct Chest W Contrast  Result Date: 02/21/2018 CLINICAL DATA:  67 year old with history of endometrial cancer diagnosed in 2016 status post hysterectomy and radiation therapy, completed in October 2018. EXAM: CT CHEST, ABDOMEN, AND PELVIS WITH CONTRAST TECHNIQUE: Multidetector CT imaging of the chest, abdomen and pelvis was performed following the standard protocol during bolus administration of intravenous contrast. CONTRAST:  124m OMNIPAQUE IOHEXOL 300 MG/ML  SOLN COMPARISON:  CT the chest, abdomen and pelvis 11/28/2017. FINDINGS: CT CHEST FINDINGS Cardiovascular: Heart size is normal. There is no significant pericardial fluid, thickening or pericardial calcification. No atherosclerotic calcifications in the thoracic aorta or the coronary arteries. Mediastinum/Nodes: No pathologically enlarged mediastinal or hilar lymph nodes. Esophagus is unremarkable in appearance. No axillary lymphadenopathy.  Lungs/Pleura: No suspicious appearing pulmonary nodules or masses are noted. No acute consolidative airspace disease. No pleural effusions. Scattered areas of septal thickening and focal architectural distortion in the periphery of the right lung, most compatible with areas of chronic post infectious or inflammatory scarring. Musculoskeletal: There are no aggressive appearing lytic or blastic lesions noted in the visualized portions of the skeleton. CT ABDOMEN PELVIS FINDINGS Hepatobiliary: 1.0 x 1.5 cm low-attenuation lesion in segment 7 of the liver, compatible with a simple cyst. No other suspicious hepatic lesions. No intra or extrahepatic biliary ductal dilatation. Gallbladder is normal in appearance. Pancreas: No pancreatic mass. No pancreatic ductal dilatation. No pancreatic or peripancreatic fluid or inflammatory changes. Spleen: Unremarkable. Adrenals/Urinary Tract: Bilateral kidneys and bilateral adrenal glands are normal in appearance. No hydroureteronephrosis. Urinary bladder is normal in appearance. Stomach/Bowel: Normal appearance of  the stomach. No pathologic dilatation of small bowel or colon. Several colonic diverticulae are noted, particularly in the sigmoid colon, without surrounding inflammatory changes to suggest an acute diverticulitis at this time. Normal appendix. Vascular/Lymphatic: Aortic atherosclerosis, without evidence of aneurysm or dissection noted in the abdominal or pelvic vasculature. Reproductive: Status post total abdominal hysterectomy and bilateral salpingo-oophorectomy. No unexpected soft tissue lesion in the low anatomic pelvis to suggest locally recurrent disease. Other: No significant volume of ascites.  No pneumoperitoneum. Musculoskeletal: There are no aggressive appearing lytic or blastic lesions noted in the visualized portions of the skeleton. IMPRESSION: 1. No findings to suggest metastatic disease in the chest, abdomen or pelvis. 2. Mild colonic diverticulosis without  evidence of acute diverticulitis at this time. 3. Aortic atherosclerosis. 4. Additional incidental findings, as above. Electronically Signed   By: Vinnie Langton M.D.   On: 02/21/2018 14:13   Mr Cervical Spine Wo Contrast  Result Date: 03/02/2018 CLINICAL DATA:  Pt had carpal tunnel surgery on right hand November and left hand December 4th. Pt still having severe pains in right arm since surgery and now starting in left shouler. EXAM: MRI CERVICAL SPINE WITHOUT CONTRAST TECHNIQUE: Multiplanar, multisequence MR imaging of the cervical spine was performed. No intravenous contrast was administered. COMPARISON:  None. FINDINGS: Significant patient motion throughout the examination degrading image quality and limiting evaluation. Alignment: Physiologic. Vertebrae: No fracture, evidence of discitis, or bone lesion. Cord: Normal signal and morphology. Posterior Fossa, vertebral arteries, paraspinal tissues: Posterior fossa demonstrates no focal abnormality. Vertebral artery flow voids are maintained. Paraspinal soft tissues are unremarkable. Disc levels: Discs: Degenerative disc disease with disc height loss at C5-6. C2-3: No significant disc bulge. No neural foraminal stenosis. No central canal stenosis. C3-4: No significant disc bulge. No neural foraminal stenosis. No central canal stenosis. C4-5: No significant disc bulge. No neural foraminal stenosis. No central canal stenosis. C5-6: Evaluation is limited secondary to patient motion degrading image quality. Possible small central disc protrusion which may contact the ventral cervical spinal cord. Mild bilateral uncovertebral degenerative changes. Mild bilateral foraminal stenosis. No central canal stenosis. C6-7: No significant disc bulge. No neural foraminal stenosis. No central canal stenosis. C7-T1: No significant disc bulge. No neural foraminal stenosis. No central canal stenosis. IMPRESSION: 1. Significant patient motion throughout the examination degrading  image quality and limiting evaluation. Possible small central disc protrusion which may contact the ventral cervical spinal cord. Mild bilateral uncovertebral degenerative changes. Mild bilateral foraminal stenosis. Electronically Signed   By: Kathreen Devoid   On: 03/02/2018 11:53   Ct Abdomen Pelvis W Contrast  Result Date: 02/21/2018 CLINICAL DATA:  67 year old with history of endometrial cancer diagnosed in 2016 status post hysterectomy and radiation therapy, completed in October 2018. EXAM: CT CHEST, ABDOMEN, AND PELVIS WITH CONTRAST TECHNIQUE: Multidetector CT imaging of the chest, abdomen and pelvis was performed following the standard protocol during bolus administration of intravenous contrast. CONTRAST:  158m OMNIPAQUE IOHEXOL 300 MG/ML  SOLN COMPARISON:  CT the chest, abdomen and pelvis 11/28/2017. FINDINGS: CT CHEST FINDINGS Cardiovascular: Heart size is normal. There is no significant pericardial fluid, thickening or pericardial calcification. No atherosclerotic calcifications in the thoracic aorta or the coronary arteries. Mediastinum/Nodes: No pathologically enlarged mediastinal or hilar lymph nodes. Esophagus is unremarkable in appearance. No axillary lymphadenopathy. Lungs/Pleura: No suspicious appearing pulmonary nodules or masses are noted. No acute consolidative airspace disease. No pleural effusions. Scattered areas of septal thickening and focal architectural distortion in the periphery of the right lung, most compatible  with areas of chronic post infectious or inflammatory scarring. Musculoskeletal: There are no aggressive appearing lytic or blastic lesions noted in the visualized portions of the skeleton. CT ABDOMEN PELVIS FINDINGS Hepatobiliary: 1.0 x 1.5 cm low-attenuation lesion in segment 7 of the liver, compatible with a simple cyst. No other suspicious hepatic lesions. No intra or extrahepatic biliary ductal dilatation. Gallbladder is normal in appearance. Pancreas: No pancreatic  mass. No pancreatic ductal dilatation. No pancreatic or peripancreatic fluid or inflammatory changes. Spleen: Unremarkable. Adrenals/Urinary Tract: Bilateral kidneys and bilateral adrenal glands are normal in appearance. No hydroureteronephrosis. Urinary bladder is normal in appearance. Stomach/Bowel: Normal appearance of the stomach. No pathologic dilatation of small bowel or colon. Several colonic diverticulae are noted, particularly in the sigmoid colon, without surrounding inflammatory changes to suggest an acute diverticulitis at this time. Normal appendix. Vascular/Lymphatic: Aortic atherosclerosis, without evidence of aneurysm or dissection noted in the abdominal or pelvic vasculature. Reproductive: Status post total abdominal hysterectomy and bilateral salpingo-oophorectomy. No unexpected soft tissue lesion in the low anatomic pelvis to suggest locally recurrent disease. Other: No significant volume of ascites.  No pneumoperitoneum. Musculoskeletal: There are no aggressive appearing lytic or blastic lesions noted in the visualized portions of the skeleton. IMPRESSION: 1. No findings to suggest metastatic disease in the chest, abdomen or pelvis. 2. Mild colonic diverticulosis without evidence of acute diverticulitis at this time. 3. Aortic atherosclerosis. 4. Additional incidental findings, as above. Electronically Signed   By: Vinnie Langton M.D.   On: 02/21/2018 14:13    All questions were answered. The patient knows to call the clinic with any problems, questions or concerns. No barriers to learning was detected.  I spent 30 minutes counseling the patient face to face. The total time spent in the appointment was 40 minutes and more than 50% was on counseling and review of test results  Heath Lark, MD 03/07/2018 1:27 PM

## 2018-03-07 NOTE — Telephone Encounter (Signed)
Gave patient avs and calendar. Patient preferred a 1 pm appt.

## 2018-03-07 NOTE — Assessment & Plan Note (Signed)
Recent CT imaging showed no evidence of active cancer in her abdomen or lungs However, the patient had significant diffuse bone pain throughout I recommend bone scan to evaluate and to exclude bony metastasis I will see her back next week for further follow-up

## 2018-03-07 NOTE — Assessment & Plan Note (Signed)
She is profoundly depressed due to her medical condition She is currently taking antidepressant It could be exacerbated by pain and recent findings of abnormal thyroid function She will continue her treatment for now She denies suicidal ideation

## 2018-03-07 NOTE — Assessment & Plan Note (Signed)
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

## 2018-03-07 NOTE — Assessment & Plan Note (Signed)
She has acquired hypothyroidism and is currently on high-dose thyroid replacement therapy I plan to recheck her thyroid function again and will adjust the medication as needed The patient is complaining of profound fatigue and recent weight gain

## 2018-03-08 LAB — VITAMIN D 25 HYDROXY (VIT D DEFICIENCY, FRACTURES): Vit D, 25-Hydroxy: 27 ng/mL — ABNORMAL LOW (ref 30.0–100.0)

## 2018-03-08 LAB — RHEUMATOID FACTOR: Rhuematoid fact SerPl-aCnc: <10 IU/mL (ref 0.0–13.9)

## 2018-03-09 LAB — CYCLIC CITRUL PEPTIDE ANTIBODY, IGG/IGA: CCP Antibodies IgG/IgA: 9 units (ref 0–19)

## 2018-03-10 ENCOUNTER — Encounter (HOSPITAL_COMMUNITY)
Admission: RE | Admit: 2018-03-10 | Discharge: 2018-03-10 | Disposition: A | Discharge status: Home / Self Care | Payer: 59 | Source: Ambulatory Visit | Attending: Hematology and Oncology | Admitting: Hematology and Oncology

## 2018-03-10 DIAGNOSIS — R52 Pain, unspecified: Secondary | ICD-10-CM | POA: Insufficient documentation

## 2018-03-10 DIAGNOSIS — C541 Malignant neoplasm of endometrium: Secondary | ICD-10-CM | POA: Insufficient documentation

## 2018-03-10 LAB — ANTINUCLEAR ANTIBODIES, IFA: ANA Ab, IFA: NEGATIVE

## 2018-03-10 MED ORDER — TECHNETIUM TC 99M MEBROFENIN IV KIT: 5.0000 | PACK | Freq: Once | INTRAVENOUS | Status: DC | PRN

## 2018-03-10 MED ORDER — TECHNETIUM TC 99M MEDRONATE IV KIT
20.0000 | PACK | Freq: Once | INTRAVENOUS | Status: AC | PRN
  Administered 2018-03-10: 20 via INTRAVENOUS

## 2018-03-14 ENCOUNTER — Telehealth: Payer: Self-pay | Admitting: Hematology and Oncology

## 2018-03-14 ENCOUNTER — Inpatient Hospital Stay (HOSPITAL_BASED_OUTPATIENT_CLINIC_OR_DEPARTMENT_OTHER): Payer: 59 | Admitting: Hematology and Oncology

## 2018-03-14 ENCOUNTER — Encounter: Payer: Self-pay | Admitting: Hematology and Oncology

## 2018-03-14 DIAGNOSIS — Z87891 Personal history of nicotine dependence: Secondary | ICD-10-CM

## 2018-03-14 DIAGNOSIS — Z90722 Acquired absence of ovaries, bilateral: Secondary | ICD-10-CM

## 2018-03-14 DIAGNOSIS — M1990 Unspecified osteoarthritis, unspecified site: Secondary | ICD-10-CM

## 2018-03-14 DIAGNOSIS — Z9221 Personal history of antineoplastic chemotherapy: Secondary | ICD-10-CM

## 2018-03-14 DIAGNOSIS — C541 Malignant neoplasm of endometrium: Secondary | ICD-10-CM | POA: Diagnosis not present

## 2018-03-14 DIAGNOSIS — C7800 Secondary malignant neoplasm of unspecified lung: Secondary | ICD-10-CM | POA: Diagnosis not present

## 2018-03-14 DIAGNOSIS — Z90710 Acquired absence of both cervix and uterus: Secondary | ICD-10-CM

## 2018-03-14 DIAGNOSIS — E559 Vitamin D deficiency, unspecified: Secondary | ICD-10-CM

## 2018-03-14 NOTE — Progress Notes (Signed)
[place] OFFICE PROGRESS NOTE  Patient Care Team: [name], MD as PCP - General (Internal Medicine) Jacelyn Pi, MD as Consulting Physician (Endocrinology) Heath Lark, MD as Consulting Physician (Hematology and Oncology)  ASSESSMENT & PLAN:  Endometrial cancer [facility]) Recent CT imaging showed no evidence of active cancer in her abdomen or lungs Bone scan excluded bony metastasis She will continue close follow-up with GYN oncologist  Vitamin D deficiency She has signs of vitamin D deficiency I recommend 2000 units of vitamin D daily  Inflammatory arthritis She had has responded well to high-dose prednisone therapy Sedimentation rate is mildly elevated Bone scan showed no evidence of bony disease I recommend prednisone taper to 20 mg starting tomorrow. Starting March 20, 2018, she will reduce prednisone to 10 mg daily and I plan to call her the following week for further prednisone taper.   No orders of the defined types were placed in this encounter.   INTERVAL HISTORY: Please see below for problem oriented charting. She returns to review test results She felt so much better with good energy and appetite since she was started on high-dose prednisone therapy She denies pain.  SUMMARY OF ONCOLOGIC HISTORY: Oncology History   Abnormal MSI, germline mutation was negative Endometrioid     Endometrial cancer ([place])   09/15/2014 Pathology Results    Endometrium, curettage - ENDOMETRIAL ADENOCARCINOMA. - SEE COMMENT. Microscopic Comment Although definitive characterization is best performed on resection specimen, as sampled, the endometrial adenocarcinoma appears to be endometrioid subtype, FIGO grade 1.     09/15/2014 Surgery    PreOp: postmenopausal bleeding, cervical stenosis PostOp: same and uterine polyp Procedure:  Hysteroscopy, Dilation and Curettage, Myosure polypectomy Surgeon: Dr. Janyth Pupa  Findings:8cm uterus with thickened  polypoid-like endometrium, large 2cm irregular appearing polyp  Specimens: 1) endometrial curettings    10/26/2014 Pathology Results    1. Uterus +/- tubes/ovaries, neoplastic - INVASIVE ADENOCARCINOMA, ENDOMETRIOID TYPE, SEE COMMENT. - TUMOR INVOLVES LESS THAN ONE HALF MYOMETRIAL THICKNESS. - TUMOR INVOLVES UTERINE ADENOMYOSIS. - BENIGN LEIOMYOMATA (UP TO 2.5 CM). - BENIGN CERVICAL MUCOSA; NEGATIVE FOR INTRAEPITHELIAL LESION OR MALIGNANCY. - BENIGN RIGHT AND LEFT OVARIES; NEGATIVE FOR ATYPIA OR MALIGNANCY. - BENIGN RIGHT AND LEFT FALLOPIAN TUBE; NEGATIVE FOR ATYPIA OR MALIGNANCY. - BENIGN PARATUBAL CYSTS; NEGATIVE FOR ATYPIA OR MALIGNANCY. - SEE TUMOR SYNOPTIC TEMPLATE BELOW. 2. Lymph nodes, regional resection, right pelvic - FOUR LYMPH NODES, NEGATIVE FOR TUMOR (0/4). 3. Lymph nodes, regional resection, left pelvic - FOUR LYMPH NODES, NEGATIVE FOR TUMOR (0/4). Microscopic Comment 1. ONCOLOGY TABLE-UTERUS, CARCINOMA OR CARCINOSARCOMA Specimen: Uterus and bilateral fallopian tubes and ovaries. Procedure: Hysterectomy and bilateral salpingo-oophorectomy. Lymph node sampling performed: Yes. Specimen integrity: Intact. Maximum tumor size: 4.0 cm (tumor involved entire endometrium) Histologic type: Adenocarcinoma, endometrioid type. Grade: 1 Myometrial invasion: 0.5 cm where myometrium is 1.5 cm in thickness Cervical stromal involvement: Absent. Extent of involvement of other organs: None Lymph - vascular invasion: Absent. Peritoneal washings: N/A Lymph nodes: # examined - 8 ; # positive - 0 Pelvic lymph nodes: N/A involved of N/A lymph nodes. Para-aortic lymph nodes: N/A involved of N/A lymph nodes. Other (specify involvement and site): N/A TNM code: pT1a, pN0 FIGO Stage (based on pathologic findings, needs clinical correlation): N/A Comments: None MSI testing abnormal    10/26/2014 Surgery    Surgery: Total robotic hysterectomy, bilateral salpingo-oophorectomy, bilateral pelvic  lymph node dissection.   Surgeons:  Lucita Lora. Alycia Rossetti, MD; Lahoma Crocker, MD   Pathology: Uterus, cervix, bilateral tubes and  ovaries, bilateral pelvic lymph nodes to pathology.   Operative findings: Omentum adherent to midline vertical incision. Fibroid uterus. Normal adnexa. Adhesions of anterior bladder to uterus. Frozen section with grade 1 cancer focally involving adenomyosis, minimal myometrial invasion.    11/17/2014 Imaging    Ct abdomen 1. Status post hysterectomy. Pelvic edema which is greater than typically seen 3 weeks postop. Suspicious for postoperative infection. 2. Bladder wall thickening and irregularity. Although this could be partially due to underdistention, Concurrent cystitis cannot be excluded. 3. Bilateral fluid density lesions along the pelvic sidewalls. Favored to represent seromas or lymphangiomas. No specific features to suggest abscess. If the patient's symptoms persist after appropriate antibiotic therapy, aspiration of the largest left pelvic sidewall "Lesion" should be considered. 4. Suspicion of mild hepatic steatosis. Indeterminate right hepatic lobe lesion. If definitive characterization is desired in this patient with history of primary malignancy, nonemergent pre and post contrast abdominal MRI could be performed. 5. Hiatal hernia.    11/25/2014 Imaging    1. Persistent bilateral pelvic sidewall fluid collections, left greater than right, with left-sided inflammatory changes. 2. Technically successful 12 French left pelvic abscess drain catheter placement. A sample of the aspirate was sent for Gram stain, culture and sensitivity.    12/08/2014 Imaging    CT abdomen and pelvis 1. Complete resolution of dominant fluid collection within the left hemipelvis following percutaneous drainage catheter placement. This percutaneous drainage catheter was subsequent removed intact at the patient's bedside. 2. Continued decrease in size of bilobed fluid collections  within the right hemipelvis with superficial component measuring 1.6 cm and posterior component measuring 2.6 cm, indeterminate though both favored to represent evolving seromas. 3. Unchanged indeterminate approximately 1.2 cm hepatic lesion. Further evaluation with contrast-enhanced abdominal MRI could be performed as clinically indicated. 4. Colonic diverticulosis without evidence of diverticulitis.    10/19/2016 Imaging    New 2.6 cm soft tissue mass involving right vaginal cuff, consistent with locally recurrent carcinoma.  No other sites of metastatic disease identified.  Colonic diverticulosis, without radiographic evidence of diverticulitis.  Mild hepatic steatosis.     10/24/2016 Relapse/Recurrence    Vaginal cuff recurrence. No evidence of distant disease.    10/24/2016 Pathology Results    Vagina, biopsy, right apex - ADENOCARCINOMA - SEE COMMENT Microscopic Comment The morphologic features are consistent with the patient's previously diagnosed endometrioid adenocarcinoma    11/08/2016 - 12/27/2016 Radiation Therapy    45 Gy in 25 fractions of 1.8 Gy. The residual pelvic mass was boosted to 14 Gy in 7 fractions of 2 Gy. No intracavitary lesion seen at completion of pelvic fields to boost with brachytherapy.    06/12/2017 Imaging    CT abdomen and pelvis 1. New 1.1 by 0.8 cm right lower lobe pulmonary nodule, concerning for pulmonary metastatic disease. Possibilities for further assessment include biopsy or nuclear medicine PET-CT to assess this lesion and the rest of the neck/chest/abdomen/pelvis for other potential hypermetabolic lesions. 2. The previous soft tissue mass along the right vaginal cuff is no longer present. 3. Stable and likely benign right hepatic lobe hypodense lesion. 4. Other imaging findings of potential clinical significance: Sigmoid colon diverticulosis. Aortic Atherosclerosis (ICD10-I70.0). Multilevel lumbar degenerative disc disease. Mild cardiomegaly.  Small type 1 hiatal hernia.    07/05/2017 Imaging    Ct chest Several bilateral pulmonary nodules measuring up to 11 mm, highly suspicious for pulmonary metastases.   No evidence of lymphadenopathy or pleural effusion.  Aortic Atherosclerosis (ICD10-I70.0).    07/25/2017 Imaging  Status post CT-guided lung nodule biopsy. Tissue specimen sent to pathology for complete histopathologic analysis.    07/25/2017 Pathology Results    Lung, needle/core biopsy(ies), RLL - METASTATIC ADENOCARCINOMA - SEE COMMENT Microscopic Comment By immunohistochemistry, the neoplastic cells are positive for Pax-8 and ER but negative for TTF-1. Overall, this immunoprofile is consistent with metastasis from the patient's known endometrioid adenocarcinoma    07/30/2017 Cancer Staging    Staging form: Corpus Uteri - Carcinoma and Carcinosarcoma, AJCC 8th Edition - Clinical: Stage IVB (rcT1, cN0, pM1) - Signed by Heath Lark, MD on 07/30/2017    08/09/2017 - 11/01/2017 Chemotherapy    The patient had pembrolizumab for chemotherapy treatment.     10/10/2017 Imaging    1. Interval decrease in size and contour. The of previously described right and left lower lobe pulmonary nodules. 2. Interval increase in size of mediastinal and hilar adenopathy, potentially metastatic in etiology. 3. Aortic Atherosclerosis (ICD10-I70.0).    11/08/2017 - 11/10/2017 Hospital Admission    She was admitted to the hospital due to uncontrolled pain    11/29/2017 Imaging    Chest Impression:  No thoracic metastasis. No discrete pulmonary nodules identified.  Abdomen / Pelvis Impression:  No evidence of local recurrence or metastatic endometrial carcinoma in the abdomen pelvis.    02/21/2018 Imaging    1. No findings to suggest metastatic disease in the chest, abdomen or pelvis. 2. Mild colonic diverticulosis without evidence of acute diverticulitis at this time. 3. Aortic atherosclerosis. 4. Additional incidental findings, as  above.    03/10/2018 Imaging    Bone scan: No scintigraphic evidence of osseous metastatic disease.     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      Metastasis to lung ([place])   07/30/2017 Initial Diagnosis    Metastasis to lung ([place])    08/02/2017 -  Chemotherapy    The patient had pembrolizumab (KEYTRUDA) 200 mg in sodium chloride 0.9 % 50 mL chemo infusion, 200 mg, Intravenous, Once, 5 of 7 cycles Administration: 200 mg (08/09/2017), 200 mg (08/30/2017), 200 mg (09/20/2017), 200 mg (10/11/2017), 200 mg (11/01/2017)  for chemotherapy treatment.      REVIEW OF SYSTEMS:   Constitutional: Denies fevers, chills or abnormal weight loss Eyes: Denies blurriness of vision Ears, nose, mouth, throat, and face: Denies mucositis or sore throat Respiratory: Denies cough, dyspnea or wheezes Cardiovascular: Denies palpitation, chest discomfort or lower extremity swelling Gastrointestinal:  Denies nausea, heartburn or change in bowel habits Skin: Denies abnormal skin rashes Lymphatics: Denies new lymphadenopathy or easy bruising Neurological:Denies numbness, tingling or new weaknesses Behavioral/Psych: Mood is stable, no new changes  All other systems were reviewed with the patient and are negative.  I have reviewed the past medical history, past surgical history, social history and family history with the patient and they are unchanged from previous note.  ALLERGIES:  is allergic to hydrocodone.  MEDICATIONS:  Current Outpatient Medications  Medication Sig Dispense Refill  . FLUoxetine (PROZAC) 40 MG capsule Take 40 mg by  mouth daily.    Marland Kitchen levothyroxine (SYNTHROID, LEVOTHROID) 112 MCG tablet Take 112 mcg by mouth daily before  breakfast.     . predniSONE (DELTASONE) 20 MG tablet Take 3 tablets (60 mg total) by mouth daily with breakfast. 90 tablet 0   No current facility-administered medications for this visit.    Facility-Administered Medications Ordered in Other Visits  Medication Dose Route Frequency Provider Last Rate Last Dose  . technetium TC 25M mebrofenin (CHOLETEC) injection 5 millicurie  5 millicurie Intravenous Once PRN Entrikin, Camelia Phenes, MD        PHYSICAL EXAMINATION: ECOG PERFORMANCE STATUS: 0 - Asymptomatic  Vitals:   03/14/18 1143  BP: 133/76  Pulse: 95  Resp: 18  Temp: 98.6 F (37 C)  SpO2: 99%   Filed Weights   03/14/18 1143  Weight: 177 lb 8 oz (80.5 kg)    GENERAL:alert, no distress and comfortable Musculoskeletal:no cyanosis of digits and no clubbing  NEURO: alert & oriented x 3 with fluent speech, no focal motor/sensory deficits  LABORATORY DATA:  I have reviewed the data as listed    Component Value Date/Time   NA 144 03/07/2018 1318   K 4.4 03/07/2018 1318   CL 108 03/07/2018 1318   CO2 27 03/07/2018 1318   GLUCOSE 91 03/07/2018 1318   BUN 9 03/07/2018 1318   BUN 18.7 10/17/2016 1353   CREATININE 0.79 03/07/2018 1318   CREATININE 0.8 10/17/2016 1353   CALCIUM 9.3 03/07/2018 1318   PROT 6.6 03/07/2018 1318   ALBUMIN 3.8 03/07/2018 1318   AST 13 (L) 03/07/2018 1318   ALT 14 03/07/2018 1318   ALKPHOS 52 03/07/2018 1318   BILITOT 0.5 03/07/2018 1318   GFRNONAA >60 03/07/2018 1318   GFRAA >60 03/07/2018 1318    No results found for: SPEP, UPEP  Lab Results  Component Value Date   WBC 4.6 03/07/2018   NEUTROABS 3.2 03/07/2018   HGB 12.4 03/07/2018   HCT 37.4 03/07/2018   MCV 91.9 03/07/2018   PLT 260 03/07/2018      Chemistry      Component Value Date/Time   NA 144 03/07/2018 1318   K 4.4 03/07/2018 1318   CL 108 03/07/2018 1318   CO2 27  03/07/2018 1318   BUN 9 03/07/2018 1318   BUN 18.7 10/17/2016 1353   CREATININE 0.79 03/07/2018 1318   CREATININE 0.8 10/17/2016 1353      Component Value Date/Time   CALCIUM 9.3 03/07/2018 1318   ALKPHOS 52 03/07/2018 1318   AST 13 (L) 03/07/2018 1318   ALT 14 03/07/2018 1318   BILITOT 0.5 03/07/2018 1318       RADIOGRAPHIC STUDIES: I have reviewed results with the patient and personally reviewed imaging study I have personally reviewed the radiological images as listed and agreed with the findings in the report. Ct Chest W Contrast  Result Date: 02/21/2018 CLINICAL DATA:  68 year old with history of endometrial cancer diagnosed in 2016 status post hysterectomy and radiation therapy, completed in October 2018. EXAM: CT CHEST, ABDOMEN, AND PELVIS WITH CONTRAST TECHNIQUE: Multidetector CT imaging of the chest, abdomen and pelvis was performed following the standard protocol during bolus administration of intravenous contrast. CONTRAST:  136m OMNIPAQUE IOHEXOL 300 MG/ML  SOLN COMPARISON:  CT the chest, abdomen and pelvis 11/28/2017. FINDINGS: CT CHEST FINDINGS Cardiovascular: Heart size is normal. There is no significant pericardial fluid, thickening or pericardial calcification. No atherosclerotic calcifications in the thoracic aorta or the coronary arteries. Mediastinum/Nodes: No pathologically enlarged mediastinal or hilar lymph nodes. Esophagus is unremarkable in appearance. No axillary lymphadenopathy. Lungs/Pleura: No suspicious appearing pulmonary nodules or  masses are noted. No acute consolidative airspace disease. No pleural effusions. Scattered areas of septal thickening and focal architectural distortion in the periphery of the right lung, most compatible with areas of chronic post infectious or inflammatory scarring. Musculoskeletal: There are no aggressive appearing lytic or blastic lesions noted in the visualized portions of the skeleton. CT ABDOMEN PELVIS FINDINGS Hepatobiliary:  1.0 x 1.5 cm low-attenuation lesion in segment 7 of the liver, compatible with a simple cyst. No other suspicious hepatic lesions. No intra or extrahepatic biliary ductal dilatation. Gallbladder is normal in appearance. Pancreas: No pancreatic mass. No pancreatic ductal dilatation. No pancreatic or peripancreatic fluid or inflammatory changes. Spleen: Unremarkable. Adrenals/Urinary Tract: Bilateral kidneys and bilateral adrenal glands are normal in appearance. No hydroureteronephrosis. Urinary bladder is normal in appearance. Stomach/Bowel: Normal appearance of the stomach. No pathologic dilatation of small bowel or colon. Several colonic diverticulae are noted, particularly in the sigmoid colon, without surrounding inflammatory changes to suggest an acute diverticulitis at this time. Normal appendix. Vascular/Lymphatic: Aortic atherosclerosis, without evidence of aneurysm or dissection noted in the abdominal or pelvic vasculature. Reproductive: Status post total abdominal hysterectomy and bilateral salpingo-oophorectomy. No unexpected soft tissue lesion in the low anatomic pelvis to suggest locally recurrent disease. Other: No significant volume of ascites.  No pneumoperitoneum. Musculoskeletal: There are no aggressive appearing lytic or blastic lesions noted in the visualized portions of the skeleton. IMPRESSION: 1. No findings to suggest metastatic disease in the chest, abdomen or pelvis. 2. Mild colonic diverticulosis without evidence of acute diverticulitis at this time. 3. Aortic atherosclerosis. 4. Additional incidental findings, as above. Electronically Signed   By: Vinnie Langton M.D.   On: 02/21/2018 14:13   Mr Cervical Spine Wo Contrast  Result Date: 03/02/2018 CLINICAL DATA:  Pt had carpal tunnel surgery on right hand November and left hand December 4th. Pt still having severe pains in right arm since surgery and now starting in left shouler. EXAM: MRI CERVICAL SPINE WITHOUT CONTRAST TECHNIQUE:  Multiplanar, multisequence MR imaging of the cervical spine was performed. No intravenous contrast was administered. COMPARISON:  None. FINDINGS: Significant patient motion throughout the examination degrading image quality and limiting evaluation. Alignment: Physiologic. Vertebrae: No fracture, evidence of discitis, or bone lesion. Cord: Normal signal and morphology. Posterior Fossa, vertebral arteries, paraspinal tissues: Posterior fossa demonstrates no focal abnormality. Vertebral artery flow voids are maintained. Paraspinal soft tissues are unremarkable. Disc levels: Discs: Degenerative disc disease with disc height loss at C5-6. C2-3: No significant disc bulge. No neural foraminal stenosis. No central canal stenosis. C3-4: No significant disc bulge. No neural foraminal stenosis. No central canal stenosis. C4-5: No significant disc bulge. No neural foraminal stenosis. No central canal stenosis. C5-6: Evaluation is limited secondary to patient motion degrading image quality. Possible small central disc protrusion which may contact the ventral cervical spinal cord. Mild bilateral uncovertebral degenerative changes. Mild bilateral foraminal stenosis. No central canal stenosis. C6-7: No significant disc bulge. No neural foraminal stenosis. No central canal stenosis. C7-T1: No significant disc bulge. No neural foraminal stenosis. No central canal stenosis. IMPRESSION: 1. Significant patient motion throughout the examination degrading image quality and limiting evaluation. Possible small central disc protrusion which may contact the ventral cervical spinal cord. Mild bilateral uncovertebral degenerative changes. Mild bilateral foraminal stenosis. Electronically Signed   By: Kathreen Devoid   On: 03/02/2018 11:53   Nm Bone Scan Whole Body  Result Date: 03/10/2018 CLINICAL DATA:  Endometrial cancer with metastasis, bone pain EXAM: NUCLEAR MEDICINE WHOLE BODY BONE SCAN  TECHNIQUE: Whole body anterior and posterior  images were obtained approximately 3 hours after intravenous injection of radiopharmaceutical. RADIOPHARMACEUTICALS:  20.9 mCi Technetium-49mMDP IV COMPARISON:  None Correlation: CT chest abdomen pelvis 02/21/2018 FINDINGS: Minimal uptake of tracer at the LEFT knee, likely degenerative. Otherwise normal osseous tracer distribution within the axial and appendicular skeleton. No abnormal sites of osseous tracer accumulation are identified to suggest osseous metastatic disease. Expected urinary tract and soft tissue distribution of tracer. IMPRESSION: No scintigraphic evidence of osseous metastatic disease. Electronically Signed   By: MLavonia DanaM.D.   On: 03/10/2018 16:31   Ct Abdomen Pelvis W Contrast  Result Date: 02/21/2018 CLINICAL DATA:  67year old with history of endometrial cancer diagnosed in 2016 status post hysterectomy and radiation therapy, completed in October 2018. EXAM: CT CHEST, ABDOMEN, AND PELVIS WITH CONTRAST TECHNIQUE: Multidetector CT imaging of the chest, abdomen and pelvis was performed following the standard protocol during bolus administration of intravenous contrast. CONTRAST:  1039mOMNIPAQUE IOHEXOL 300 MG/ML  SOLN COMPARISON:  CT the chest, abdomen and pelvis 11/28/2017. FINDINGS: CT CHEST FINDINGS Cardiovascular: Heart size is normal. There is no significant pericardial fluid, thickening or pericardial calcification. No atherosclerotic calcifications in the thoracic aorta or the coronary arteries. Mediastinum/Nodes: No pathologically enlarged mediastinal or hilar lymph nodes. Esophagus is unremarkable in appearance. No axillary lymphadenopathy. Lungs/Pleura: No suspicious appearing pulmonary nodules or masses are noted. No acute consolidative airspace disease. No pleural effusions. Scattered areas of septal thickening and focal architectural distortion in the periphery of the right lung, most compatible with areas of chronic post infectious or inflammatory scarring.  Musculoskeletal: There are no aggressive appearing lytic or blastic lesions noted in the visualized portions of the skeleton. CT ABDOMEN PELVIS FINDINGS Hepatobiliary: 1.0 x 1.5 cm low-attenuation lesion in segment 7 of the liver, compatible with a simple cyst. No other suspicious hepatic lesions. No intra or extrahepatic biliary ductal dilatation. Gallbladder is normal in appearance. Pancreas: No pancreatic mass. No pancreatic ductal dilatation. No pancreatic or peripancreatic fluid or inflammatory changes. Spleen: Unremarkable. Adrenals/Urinary Tract: Bilateral kidneys and bilateral adrenal glands are normal in appearance. No hydroureteronephrosis. Urinary bladder is normal in appearance. Stomach/Bowel: Normal appearance of the stomach. No pathologic dilatation of small bowel or colon. Several colonic diverticulae are noted, particularly in the sigmoid colon, without surrounding inflammatory changes to suggest an acute diverticulitis at this time. Normal appendix. Vascular/Lymphatic: Aortic atherosclerosis, without evidence of aneurysm or dissection noted in the abdominal or pelvic vasculature. Reproductive: Status post total abdominal hysterectomy and bilateral salpingo-oophorectomy. No unexpected soft tissue lesion in the low anatomic pelvis to suggest locally recurrent disease. Other: No significant volume of ascites.  No pneumoperitoneum. Musculoskeletal: There are no aggressive appearing lytic or blastic lesions noted in the visualized portions of the skeleton. IMPRESSION: 1. No findings to suggest metastatic disease in the chest, abdomen or pelvis. 2. Mild colonic diverticulosis without evidence of acute diverticulitis at this time. 3. Aortic atherosclerosis. 4. Additional incidental findings, as above. Electronically Signed   By: DaVinnie Langton.D.   On: 02/21/2018 14:13    All questions were answered. The patient knows to call the clinic with any problems, questions or concerns. No barriers to  learning was detected.  I spent 15 minutes counseling the patient face to face. The total time spent in the appointment was 20 minutes and more than 50% was on counseling and review of test results  NiHeath LarkMD 03/14/2018 1:07 PM

## 2018-03-14 NOTE — Assessment & Plan Note (Signed)
She had has responded well to high-dose prednisone therapy Sedimentation rate is mildly elevated Bone scan showed no evidence of bony disease I recommend prednisone taper to 20 mg starting tomorrow. Starting March 20, 2018, she will reduce prednisone to 10 mg daily and I plan to call her the following week for further prednisone taper.

## 2018-03-14 NOTE — Assessment & Plan Note (Signed)
Recent CT imaging showed no evidence of active cancer in her abdomen or lungs Bone scan excluded bony metastasis She will continue close follow-up with GYN oncologist

## 2018-03-14 NOTE — Assessment & Plan Note (Signed)
She has signs of vitamin D deficiency I recommend 2000 units of vitamin D daily

## 2018-03-14 NOTE — Telephone Encounter (Signed)
Per 12/20 no new orders

## 2018-03-25 ENCOUNTER — Other Ambulatory Visit: Payer: Self-pay | Admitting: Hematology and Oncology

## 2018-03-25 DIAGNOSIS — C7800 Secondary malignant neoplasm of unspecified lung: Secondary | ICD-10-CM

## 2018-03-25 DIAGNOSIS — C541 Malignant neoplasm of endometrium: Secondary | ICD-10-CM

## 2018-03-25 MED ORDER — PREDNISONE 5 MG PO TABS: 5.0000 mg | ORAL_TABLET | Freq: Every day | ORAL | 0 refills | Status: DC

## 2018-03-26 HISTORY — PX: CARPAL TUNNEL RELEASE: SHX101

## 2018-03-27 ENCOUNTER — Ambulatory Visit: Payer: 59 | Admitting: Hematology and Oncology

## 2018-03-31 ENCOUNTER — Telehealth: Payer: Self-pay | Admitting: Hematology and Oncology

## 2018-03-31 NOTE — Telephone Encounter (Signed)
Left message re February appointments. Schedule mailed.  °

## 2018-04-01 ENCOUNTER — Ambulatory Visit
Admission: RE | Admit: 2018-04-01 | Discharge: 2018-04-01 | Disposition: A | Discharge status: Home / Self Care | Payer: 59 | Source: Ambulatory Visit | Attending: Internal Medicine | Admitting: Internal Medicine

## 2018-04-01 DIAGNOSIS — Z1231 Encounter for screening mammogram for malignant neoplasm of breast: Secondary | ICD-10-CM

## 2018-04-03 ENCOUNTER — Telehealth: Payer: Self-pay

## 2018-04-03 ENCOUNTER — Other Ambulatory Visit: Payer: Self-pay | Admitting: Hematology and Oncology

## 2018-04-03 NOTE — Telephone Encounter (Signed)
OK to stay on prednisone 10 mg daily for now Will call her again in 10 days to check Does she need refill?

## 2018-04-03 NOTE — Telephone Encounter (Signed)
pls call. See staff message to see if we should call in 5 mg pill instead

## 2018-04-03 NOTE — Telephone Encounter (Signed)
Called back and given below message. She verbalized understanding. No refill needed on Prednisone Rx. Last Rx refill she got 180 pills.

## 2018-04-03 NOTE — Telephone Encounter (Signed)
-----   Message from Heath Lark, MD sent at 04/03/2018  7:50 AM EST ----- Regarding: prednisone taper Can you call and ask how she is doing? She is supposed to taper her prednisone, by now should be done to 10 mg or less If she tolerated the taper I would like her to go to 5 mg If she does not have the lower dose pill, call in 30 days supply

## 2018-04-03 NOTE — Telephone Encounter (Signed)
Called with below message.  She is taking 5 mg Prednisone for 7 days. She would like to go back up to 10 mg daily. She has had a sinus infection since 12/27. She is using a over the counter nasal spray for swollen nasal passages. Denies fever. Complaining of being SOB with exertion. She using a Proair inhaler 1 puff daily from last year that she has from when she had bronchitis last year.

## 2018-04-09 ENCOUNTER — Telehealth: Payer: Self-pay

## 2018-04-09 NOTE — Telephone Encounter (Signed)
-----   Message from Heath Lark, MD sent at 04/09/2018  8:47 AM EST ----- Regarding: prednisone Can you check on her and see if she is ready for taper? 5 mg alternate with 10 mg every other day

## 2018-04-09 NOTE — Telephone Encounter (Signed)
Called and given below message. She verbalized understanding. 

## 2018-04-21 ENCOUNTER — Telehealth: Payer: Self-pay | Admitting: *Deleted

## 2018-04-21 NOTE — Telephone Encounter (Signed)
TCT patient re: joint pain.  Spoke with patient. She states her pain is so much better on the steroids.  States she feels well.  She is willing to go to 5 mg daily. She is asking for how long on the 5 mg/day.  She states she is going back to work on 04/28/18

## 2018-04-21 NOTE — Telephone Encounter (Signed)
TCT patient and advised her to stay on the 5 mg predisone for now.  We will call her next week to see how she is doing. Pt voiced understanding

## 2018-04-21 NOTE — Telephone Encounter (Signed)
stay on 5 mg daily for now I will call her again next week

## 2018-05-02 ENCOUNTER — Telehealth: Payer: Self-pay | Admitting: *Deleted

## 2018-05-02 NOTE — Telephone Encounter (Signed)
-----   Message from Heath Lark, MD sent at 05/02/2018  9:23 AM EST ----- Regarding: prednisone taper Can you call and ask how she is doing?Is she willing to try prednisone 5 mg every other day?

## 2018-05-02 NOTE — Telephone Encounter (Signed)
Called pt to discuss prednisone.  She states that she took herself off of prednisone & started itching.  She was off M-W & started itching & restarted last hs.  She does report a rash on her back & will try to make a picture but lives alone & not sure if she can do this.  She is willing to try Prednisone 5 mg every other day.  Instructed to call us back next week or before if other symptoms come up.

## 2018-05-21 ENCOUNTER — Ambulatory Visit (HOSPITAL_COMMUNITY)
Admission: RE | Admit: 2018-05-21 | Discharge: 2018-05-21 | Disposition: A | Discharge status: Home / Self Care | Payer: 59 | Source: Ambulatory Visit | Attending: Hematology and Oncology | Admitting: Hematology and Oncology

## 2018-05-21 ENCOUNTER — Inpatient Hospital Stay: Payer: 59 | Attending: Gynecologic Oncology

## 2018-05-21 ENCOUNTER — Encounter (HOSPITAL_COMMUNITY): Payer: Self-pay

## 2018-05-21 DIAGNOSIS — Z9221 Personal history of antineoplastic chemotherapy: Secondary | ICD-10-CM | POA: Insufficient documentation

## 2018-05-21 DIAGNOSIS — C7800 Secondary malignant neoplasm of unspecified lung: Secondary | ICD-10-CM | POA: Diagnosis present

## 2018-05-21 DIAGNOSIS — M064 Inflammatory polyarthropathy: Secondary | ICD-10-CM | POA: Insufficient documentation

## 2018-05-21 DIAGNOSIS — Z923 Personal history of irradiation: Secondary | ICD-10-CM | POA: Diagnosis not present

## 2018-05-21 DIAGNOSIS — R21 Rash and other nonspecific skin eruption: Secondary | ICD-10-CM | POA: Insufficient documentation

## 2018-05-21 DIAGNOSIS — C541 Malignant neoplasm of endometrium: Secondary | ICD-10-CM | POA: Insufficient documentation

## 2018-05-21 DIAGNOSIS — E038 Other specified hypothyroidism: Secondary | ICD-10-CM

## 2018-05-21 DIAGNOSIS — Z79899 Other long term (current) drug therapy: Secondary | ICD-10-CM | POA: Diagnosis not present

## 2018-05-21 LAB — CBC WITH DIFFERENTIAL (CANCER CENTER ONLY)
Abs Immature Granulocytes: 0.02 10*3/uL (ref 0.00–0.07)
Basophils Absolute: 0.1 10*3/uL (ref 0.0–0.1)
Basophils Relative: 2 %
Eosinophils Absolute: 0.2 10*3/uL (ref 0.0–0.5)
Eosinophils Relative: 5 %
HCT: 38.4 % (ref 36.0–46.0)
Hemoglobin: 12.7 g/dL (ref 12.0–15.0)
Immature Granulocytes: 1 %
Lymphocytes Relative: 16 %
Lymphs Abs: 0.7 10*3/uL (ref 0.7–4.0)
MCH: 30.4 pg (ref 26.0–34.0)
MCHC: 33.1 g/dL (ref 30.0–36.0)
MCV: 91.9 fL (ref 80.0–100.0)
Monocytes Absolute: 0.5 10*3/uL (ref 0.1–1.0)
Monocytes Relative: 12 %
Neutro Abs: 2.9 10*3/uL (ref 1.7–7.7)
Neutrophils Relative %: 64 %
Platelet Count: 245 10*3/uL (ref 150–400)
RBC: 4.18 MIL/uL (ref 3.87–5.11)
RDW: 13.6 % (ref 11.5–15.5)
WBC Count: 4.4 10*3/uL (ref 4.0–10.5)
nRBC: 0 % (ref 0.0–0.2)

## 2018-05-21 LAB — CMP (CANCER CENTER ONLY)
ALT: 18 U/L (ref 0–44)
AST: 14 U/L — ABNORMAL LOW (ref 15–41)
Albumin: 3.7 g/dL (ref 3.5–5.0)
Alkaline Phosphatase: 58 U/L (ref 38–126)
Anion gap: 9 (ref 5–15)
BUN: 10 mg/dL (ref 8–23)
CALCIUM: 9.4 mg/dL (ref 8.9–10.3)
CO2: 28 mmol/L (ref 22–32)
Chloride: 105 mmol/L (ref 98–111)
Creatinine: 0.76 mg/dL (ref 0.44–1.00)
GFR, Est AFR Am: >60 mL/min (ref >60)
GFR, Estimated: >60 mL/min (ref >60)
Glucose, Bld: 89 mg/dL (ref 70–99)
Potassium: 3.5 mmol/L (ref 3.5–5.1)
Sodium: 142 mmol/L (ref 135–145)
Total Bilirubin: 0.6 mg/dL (ref 0.3–1.2)
Total Protein: 6.5 g/dL (ref 6.5–8.1)

## 2018-05-21 LAB — TSH: TSH: 0.329 u[IU]/mL (ref 0.308–3.960)

## 2018-05-21 MED ORDER — IOHEXOL 300 MG/ML  SOLN
100.0000 mL | Freq: Once | INTRAMUSCULAR | Status: AC | PRN
  Administered 2018-05-21: 100 mL via INTRAVENOUS

## 2018-05-21 MED ORDER — SODIUM CHLORIDE (PF) 0.9 % IJ SOLN
INTRAMUSCULAR | Status: AC
  Filled 2018-05-21: qty 50

## 2018-05-21 MED ORDER — IOHEXOL 300 MG/ML  SOLN
30.0000 mL | Freq: Once | INTRAMUSCULAR | Status: AC | PRN
  Administered 2018-05-21: 30 mL via ORAL

## 2018-05-22 ENCOUNTER — Inpatient Hospital Stay (HOSPITAL_BASED_OUTPATIENT_CLINIC_OR_DEPARTMENT_OTHER): Payer: 59 | Admitting: Hematology and Oncology

## 2018-05-22 ENCOUNTER — Telehealth: Payer: Self-pay | Admitting: Hematology and Oncology

## 2018-05-22 ENCOUNTER — Encounter: Payer: Self-pay | Admitting: Hematology and Oncology

## 2018-05-22 VITALS — BP 130/60 | HR 81 | Temp 98.4°F | Resp 18 | Ht 60.0 in | Wt 184.0 lb

## 2018-05-22 DIAGNOSIS — Z9221 Personal history of antineoplastic chemotherapy: Secondary | ICD-10-CM

## 2018-05-22 DIAGNOSIS — C541 Malignant neoplasm of endometrium: Secondary | ICD-10-CM

## 2018-05-22 DIAGNOSIS — C7800 Secondary malignant neoplasm of unspecified lung: Secondary | ICD-10-CM | POA: Diagnosis not present

## 2018-05-22 DIAGNOSIS — M1380 Other specified arthritis, unspecified site: Secondary | ICD-10-CM

## 2018-05-22 DIAGNOSIS — Z923 Personal history of irradiation: Secondary | ICD-10-CM

## 2018-05-22 DIAGNOSIS — M1990 Unspecified osteoarthritis, unspecified site: Secondary | ICD-10-CM

## 2018-05-22 DIAGNOSIS — M064 Inflammatory polyarthropathy: Secondary | ICD-10-CM

## 2018-05-22 DIAGNOSIS — R21 Rash and other nonspecific skin eruption: Secondary | ICD-10-CM

## 2018-05-22 DIAGNOSIS — Z79899 Other long term (current) drug therapy: Secondary | ICD-10-CM

## 2018-05-22 NOTE — Telephone Encounter (Signed)
Patient requested monday

## 2018-05-22 NOTE — Assessment & Plan Note (Signed)
She managed to stop prednisone completely a week ago and tolerated that well.

## 2018-05-22 NOTE — Assessment & Plan Note (Signed)
She has nonspecific skin rash of unknown etiology I recommend dermatology consult for further evaluation

## 2018-05-22 NOTE — Progress Notes (Signed)
[place] OFFICE PROGRESS NOTE  Patient Care Team: [name], MD as PCP - General (Internal Medicine) Jacelyn Pi, MD as Consulting Physician (Endocrinology) Heath Lark, MD as Consulting Physician (Hematology and Oncology)  ASSESSMENT & PLAN:  Endometrial cancer [facility]) CT imaging showed no evidence of disease recurrence She has appointment to see radiation oncologist next month I will schedule appointment to see me in 6 months along with imaging study  Metastasis to lung [facility]) She has no signs of cancer recurrence in her lungs Nevertheless, she is at high risk of disease recurrence I plan to repeat imaging study in 6 months  Inflammatory arthritis She managed to stop prednisone completely a week ago and tolerated that well.  Skin rash She has nonspecific skin rash of unknown etiology I recommend dermatology consult for further evaluation   Orders Placed This Encounter  Procedures  . CT CHEST W CONTRAST    Standing Status:   Future    Standing Expiration Date:   06/26/2019    Order Specific Question:   If indicated for the ordered procedure, I authorize the administration of contrast media per Radiology protocol    Answer:   Yes    Order Specific Question:   Preferred imaging location?    Answer:   [facility]    Order Specific Question:   Radiology Contrast Protocol - do NOT remove file path    Answer:   _0 [email or web address]  . CT ABDOMEN PELVIS W CONTRAST    Standing Status:   Future    Standing Expiration Date:   06/26/2019    Order Specific Question:   If indicated for the ordered procedure, I authorize the administration of contrast media per Radiology protocol    Answer:   Yes    Order Specific Question:   Preferred imaging location?    Answer:   [facility]    Order Specific Question:   Radiology Contrast Protocol - do NOT remove file path    Answer:   _1 [email or web address]  .  Comprehensive metabolic panel    Standing Status:   Future    Standing Expiration Date:   06/26/2019  . CBC with Differential/Platelet    Standing Status:   Future    Standing Expiration Date:   06/26/2019    INTERVAL HISTORY: Please see below for problem oriented charting. She returns to review test results She managed to taper off prednisone a week ago and tolerated that fine She continues to have recurrent skin rash predominantly on her anterior chest wall that is itchy She denies significant joint pain No recent cough, fever or chills  SUMMARY OF ONCOLOGIC HISTORY: Oncology History   Abnormal MSI, germline mutation was negative Endometrioid     Endometrial cancer ([place])   09/15/2014 Pathology Results    Endometrium, curettage - ENDOMETRIAL ADENOCARCINOMA. - SEE COMMENT. Microscopic Comment Although definitive characterization is best performed on resection specimen, as sampled, the endometrial adenocarcinoma appears to be endometrioid subtype, FIGO grade 1.     09/15/2014 Surgery    PreOp: postmenopausal bleeding, cervical stenosis PostOp: same and uterine polyp Procedure:  Hysteroscopy, Dilation and Curettage, Myosure polypectomy Surgeon: Dr. Janyth Pupa  Findings:8cm uterus with thickened polypoid-like endometrium, large 2cm irregular appearing polyp  Specimens: 1) endometrial curettings    10/26/2014 Pathology Results    1. Uterus +/- tubes/ovaries, neoplastic - INVASIVE ADENOCARCINOMA, ENDOMETRIOID TYPE, SEE COMMENT. - TUMOR INVOLVES LESS THAN ONE HALF MYOMETRIAL THICKNESS. - TUMOR INVOLVES UTERINE ADENOMYOSIS. - BENIGN LEIOMYOMATA (UP  TO 2.5 CM). - BENIGN CERVICAL MUCOSA; NEGATIVE FOR INTRAEPITHELIAL LESION OR MALIGNANCY. - BENIGN RIGHT AND LEFT OVARIES; NEGATIVE FOR ATYPIA OR MALIGNANCY. - BENIGN RIGHT AND LEFT FALLOPIAN TUBE; NEGATIVE FOR ATYPIA OR MALIGNANCY. - BENIGN PARATUBAL CYSTS; NEGATIVE FOR ATYPIA OR MALIGNANCY. - SEE TUMOR SYNOPTIC TEMPLATE BELOW. 2.  Lymph nodes, regional resection, right pelvic - FOUR LYMPH NODES, NEGATIVE FOR TUMOR (0/4). 3. Lymph nodes, regional resection, left pelvic - FOUR LYMPH NODES, NEGATIVE FOR TUMOR (0/4). Microscopic Comment 1. ONCOLOGY TABLE-UTERUS, CARCINOMA OR CARCINOSARCOMA Specimen: Uterus and bilateral fallopian tubes and ovaries. Procedure: Hysterectomy and bilateral salpingo-oophorectomy. Lymph node sampling performed: Yes. Specimen integrity: Intact. Maximum tumor size: 4.0 cm (tumor involved entire endometrium) Histologic type: Adenocarcinoma, endometrioid type. Grade: 1 Myometrial invasion: 0.5 cm where myometrium is 1.5 cm in thickness Cervical stromal involvement: Absent. Extent of involvement of other organs: None Lymph - vascular invasion: Absent. Peritoneal washings: N/A Lymph nodes: # examined - 8 ; # positive - 0 Pelvic lymph nodes: N/A involved of N/A lymph nodes. Para-aortic lymph nodes: N/A involved of N/A lymph nodes. Other (specify involvement and site): N/A TNM code: pT1a, pN0 FIGO Stage (based on pathologic findings, needs clinical correlation): N/A Comments: None MSI testing abnormal    10/26/2014 Surgery    Surgery: Total robotic hysterectomy, bilateral salpingo-oophorectomy, bilateral pelvic lymph node dissection.   Surgeons:  Lucita Lora. Alycia Rossetti, MD; Lahoma Crocker, MD   Pathology: Uterus, cervix, bilateral tubes and ovaries, bilateral pelvic lymph nodes to pathology.   Operative findings: Omentum adherent to midline vertical incision. Fibroid uterus. Normal adnexa. Adhesions of anterior bladder to uterus. Frozen section with grade 1 cancer focally involving adenomyosis, minimal myometrial invasion.    11/17/2014 Imaging    Ct abdomen 1. Status post hysterectomy. Pelvic edema which is greater than typically seen 3 weeks postop. Suspicious for postoperative infection. 2. Bladder wall thickening and irregularity. Although this could be partially due to  underdistention, Concurrent cystitis cannot be excluded. 3. Bilateral fluid density lesions along the pelvic sidewalls. Favored to represent seromas or lymphangiomas. No specific features to suggest abscess. If the patient's symptoms persist after appropriate antibiotic therapy, aspiration of the largest left pelvic sidewall "Lesion" should be considered. 4. Suspicion of mild hepatic steatosis. Indeterminate right hepatic lobe lesion. If definitive characterization is desired in this patient with history of primary malignancy, nonemergent pre and post contrast abdominal MRI could be performed. 5. Hiatal hernia.    11/25/2014 Imaging    1. Persistent bilateral pelvic sidewall fluid collections, left greater than right, with left-sided inflammatory changes. 2. Technically successful 12 French left pelvic abscess drain catheter placement. A sample of the aspirate was sent for Gram stain, culture and sensitivity.    12/08/2014 Imaging    CT abdomen and pelvis 1. Complete resolution of dominant fluid collection within the left hemipelvis following percutaneous drainage catheter placement. This percutaneous drainage catheter was subsequent removed intact at the patient's bedside. 2. Continued decrease in size of bilobed fluid collections within the right hemipelvis with superficial component measuring 1.6 cm and posterior component measuring 2.6 cm, indeterminate though both favored to represent evolving seromas. 3. Unchanged indeterminate approximately 1.2 cm hepatic lesion. Further evaluation with contrast-enhanced abdominal MRI could be performed as clinically indicated. 4. Colonic diverticulosis without evidence of diverticulitis.    10/19/2016 Imaging    New 2.6 cm soft tissue mass involving right vaginal cuff, consistent with locally recurrent carcinoma.  No other sites of metastatic disease identified.  Colonic diverticulosis, without radiographic evidence of  diverticulitis.  Mild hepatic  steatosis.     10/24/2016 Relapse/Recurrence    Vaginal cuff recurrence. No evidence of distant disease.    10/24/2016 Pathology Results    Vagina, biopsy, right apex - ADENOCARCINOMA - SEE COMMENT Microscopic Comment The morphologic features are consistent with the patient's previously diagnosed endometrioid adenocarcinoma    11/08/2016 - 12/27/2016 Radiation Therapy    45 Gy in 25 fractions of 1.8 Gy. The residual pelvic mass was boosted to 14 Gy in 7 fractions of 2 Gy. No intracavitary lesion seen at completion of pelvic fields to boost with brachytherapy.    06/12/2017 Imaging    CT abdomen and pelvis 1. New 1.1 by 0.8 cm right lower lobe pulmonary nodule, concerning for pulmonary metastatic disease. Possibilities for further assessment include biopsy or nuclear medicine PET-CT to assess this lesion and the rest of the neck/chest/abdomen/pelvis for other potential hypermetabolic lesions. 2. The previous soft tissue mass along the right vaginal cuff is no longer present. 3. Stable and likely benign right hepatic lobe hypodense lesion. 4. Other imaging findings of potential clinical significance: Sigmoid colon diverticulosis. Aortic Atherosclerosis (ICD10-I70.0). Multilevel lumbar degenerative disc disease. Mild cardiomegaly. Small type 1 hiatal hernia.    07/05/2017 Imaging    Ct chest Several bilateral pulmonary nodules measuring up to 11 mm, highly suspicious for pulmonary metastases.   No evidence of lymphadenopathy or pleural effusion.  Aortic Atherosclerosis (ICD10-I70.0).    07/25/2017 Imaging    Status post CT-guided lung nodule biopsy. Tissue specimen sent to pathology for complete histopathologic analysis.    07/25/2017 Pathology Results    Lung, needle/core biopsy(ies), RLL - METASTATIC ADENOCARCINOMA - SEE COMMENT Microscopic Comment By immunohistochemistry, the neoplastic cells are positive for Pax-8 and ER but negative for TTF-1. Overall, this immunoprofile is consistent  with metastasis from the patient's known endometrioid adenocarcinoma    07/30/2017 Cancer Staging    Staging form: Corpus Uteri - Carcinoma and Carcinosarcoma, AJCC 8th Edition - Clinical: Stage IVB (rcT1, cN0, pM1) - Signed by Heath Lark, MD on 07/30/2017    08/09/2017 - 11/01/2017 Chemotherapy    The patient had pembrolizumab for chemotherapy treatment.     10/10/2017 Imaging    1. Interval decrease in size and contour. The of previously described right and left lower lobe pulmonary nodules. 2. Interval increase in size of mediastinal and hilar adenopathy, potentially metastatic in etiology. 3. Aortic Atherosclerosis (ICD10-I70.0).    11/08/2017 - 11/10/2017 Hospital Admission    She was admitted to the hospital due to uncontrolled pain    11/29/2017 Imaging    Chest Impression:  No thoracic metastasis. No discrete pulmonary nodules identified.  Abdomen / Pelvis Impression:  No evidence of local recurrence or metastatic endometrial carcinoma in the abdomen pelvis.    02/21/2018 Imaging    1. No findings to suggest metastatic disease in the chest, abdomen or pelvis. 2. Mild colonic diverticulosis without evidence of acute diverticulitis at this time. 3. Aortic atherosclerosis. 4. Additional incidental findings, as above.    03/10/2018 Imaging    Bone scan: No scintigraphic evidence of osseous metastatic disease.    05/22/2018 Imaging    1. Stable exam. No evidence of recurrent or metastatic carcinoma within the chest, abdomen, or pelvis. 2. Colonic diverticulosis, without radiographic evidence of diverticulitis.     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      Metastasis to lung ([place])   07/30/2017 Initial Diagnosis    Metastasis to lung ([place])    08/02/2017 -  Chemotherapy    The patient had pembrolizumab (KEYTRUDA) 200 mg in sodium chloride 0.9 % 50 mL chemo infusion, 200 mg, Intravenous, Once, 5 of 7 cycles Administration: 200 mg (08/09/2017), 200 mg (08/30/2017), 200 mg (09/20/2017), 200 mg (10/11/2017), 200 mg (11/01/2017)  for chemotherapy treatment.      REVIEW OF SYSTEMS:   Constitutional: Denies fevers, chills or abnormal weight loss Eyes: Denies blurriness of vision Ears, nose, mouth, throat, and face: Denies mucositis or sore throat Respiratory: Denies cough, dyspnea or wheezes Cardiovascular: Denies palpitation, chest discomfort or lower extremity swelling Gastrointestinal:  Denies nausea, heartburn or change in bowel habits Lymphatics: Denies new lymphadenopathy or easy bruising Neurological:Denies numbness, tingling or new weaknesses Behavioral/Psych: Mood is stable, no new changes  All other systems were reviewed with the patient and are negative.  I have reviewed the past medical history, past surgical history, social history and family history with the patient and they are unchanged from previous note.  ALLERGIES:  is allergic to hydrocodone.  MEDICATIONS:  Current Outpatient Medications  Medication Sig Dispense Refill  . FLUoxetine (PROZAC) 40 MG capsule Take 40 mg by mouth daily.    Marland Kitchen levothyroxine (SYNTHROID, LEVOTHROID) 112 MCG tablet Take 112 mcg by mouth daily before breakfast.      No current facility-administered medications for this visit.     PHYSICAL EXAMINATION: ECOG PERFORMANCE STATUS: 0 - Asymptomatic  Vitals:   05/22/18 1208  BP: 130/60  Pulse: 81  Resp: 18  Temp: 98.4 F (36.9 C)  SpO2: 98%   Filed Weights   05/22/18 1208  Weight: 184 lb (83.5 kg)    GENERAL:alert, no distress and comfortable SKIN: Noted skin rash on her chest consistent with  dermatitis EYES: normal, Conjunctiva are pink and non-injected, sclera clear OROPHARYNX:no exudate, no erythema and lips, buccal mucosa, and tongue normal  NECK: supple, thyroid normal size, non-tender, without nodularity LYMPH:  no palpable lymphadenopathy in the cervical, axillary or inguinal LUNGS: clear to auscultation and percussion with normal breathing effort HEART: regular rate & rhythm and no murmurs and no lower extremity edema ABDOMEN:abdomen soft, non-tender and normal bowel sounds Musculoskeletal:no cyanosis of digits and no clubbing  NEURO: alert & oriented x 3 with fluent speech, no focal motor/sensory deficits  LABORATORY DATA:  I have reviewed the data as listed    Component Value Date/Time   NA 142 05/21/2018 1337   K 3.5 05/21/2018 1337   CL 105 05/21/2018 1337   CO2 28 05/21/2018 1337   GLUCOSE 89 05/21/2018 1337   BUN 10 05/21/2018 1337   BUN 18.7 10/17/2016 1353   CREATININE 0.76 05/21/2018 1337   CREATININE 0.8 10/17/2016 1353   CALCIUM 9.4 05/21/2018 1337   PROT 6.5 05/21/2018 1337   ALBUMIN 3.7 05/21/2018 1337   AST 14 (L) 05/21/2018 1337   ALT 18 05/21/2018 1337   ALKPHOS 58 05/21/2018 1337   BILITOT 0.6 05/21/2018 1337   GFRNONAA >60 05/21/2018 1337   GFRAA >60 05/21/2018 1337    No results found for: SPEP, UPEP  Lab Results  Component Value Date   WBC 4.4 05/21/2018  NEUTROABS 2.9 05/21/2018   HGB 12.7 05/21/2018   HCT 38.4 05/21/2018   MCV 91.9 05/21/2018   PLT 245 05/21/2018      Chemistry      Component Value Date/Time   NA 142 05/21/2018 1337   K 3.5 05/21/2018 1337   CL 105 05/21/2018 1337   CO2 28 05/21/2018 1337   BUN 10 05/21/2018 1337   BUN 18.7 10/17/2016 1353   CREATININE 0.76 05/21/2018 1337   CREATININE 0.8 10/17/2016 1353      Component Value Date/Time   CALCIUM 9.4 05/21/2018 1337   ALKPHOS 58 05/21/2018 1337   AST 14 (L) 05/21/2018 1337   ALT 18 05/21/2018 1337   BILITOT 0.6 05/21/2018 1337        RADIOGRAPHIC STUDIES: I have reviewed multiple imaging studies with the patient I have personally reviewed the radiological images as listed and agreed with the findings in the report. Ct Chest W Contrast  Result Date: 05/22/2018 CLINICAL DATA:  Follow-up endometrial carcinoma. Previous hysterectomy, radiation therapy, and immunotherapy. EXAM: CT CHEST, ABDOMEN, AND PELVIS WITH CONTRAST TECHNIQUE: Multidetector CT imaging of the chest, abdomen and pelvis was performed following the standard protocol during bolus administration of intravenous contrast. CONTRAST:  43m OMNIPAQUE IOHEXOL 300 MG/ML SOLN, 1040mOMNIPAQUE IOHEXOL 300 MG/ML SOLN COMPARISON:  02/21/2018 FINDINGS: CT CHEST FINDINGS Cardiovascular: No acute findings. Aortic atherosclerosis. Mediastinum/Lymph Nodes: No masses or pathologically enlarged lymph nodes identified. Lungs/Pleura: Mild right lung scarring is unchanged. 3 mm pulmonary nodule in the lateral right upper lobe is stable compared to multiple prior exams, consistent with benign etiology. No suspicious pulmonary nodules or masses identified. No evidence of pleural effusion. Musculoskeletal:  No suspicious bone lesions identified. CT ABDOMEN AND PELVIS FINDINGS Hepatobiliary: No masses identified. Stable small right hepatic lobe cyst. Gallbladder is unremarkable. Pancreas:  No mass or inflammatory changes. Spleen:  Within normal limits in size and appearance. Adrenals/Urinary tract:  No masses or hydronephrosis. Stomach/Bowel: No evidence of obstruction, inflammatory process, or abnormal fluid collections. Diverticulosis is seen mainly involving the sigmoid colon, however there is no evidence of diverticulitis. Normal appendix visualized. Vascular/Lymphatic: No pathologically enlarged lymph nodes identified. No abdominal aortic aneurysm. Aortic atherosclerosis. Reproductive: Previous hysterectomy. No adnexal or other pelvic masses identified. No evidence of ascites. Other:  None.  Musculoskeletal:  No suspicious bone lesions identified. IMPRESSION: 1. Stable exam. No evidence of recurrent or metastatic carcinoma within the chest, abdomen, or pelvis. 2. Colonic diverticulosis, without radiographic evidence of diverticulitis. Electronically Signed   By: JoEarle Gell.D.   On: 05/22/2018 11:07   Ct Abdomen Pelvis W Contrast  Result Date: 05/22/2018 CLINICAL DATA:  Follow-up endometrial carcinoma. Previous hysterectomy, radiation therapy, and immunotherapy. EXAM: CT CHEST, ABDOMEN, AND PELVIS WITH CONTRAST TECHNIQUE: Multidetector CT imaging of the chest, abdomen and pelvis was performed following the standard protocol during bolus administration of intravenous contrast. CONTRAST:  3048mMNIPAQUE IOHEXOL 300 MG/ML SOLN, 100m51mNIPAQUE IOHEXOL 300 MG/ML SOLN COMPARISON:  02/21/2018 FINDINGS: CT CHEST FINDINGS Cardiovascular: No acute findings. Aortic atherosclerosis. Mediastinum/Lymph Nodes: No masses or pathologically enlarged lymph nodes identified. Lungs/Pleura: Mild right lung scarring is unchanged. 3 mm pulmonary nodule in the lateral right upper lobe is stable compared to multiple prior exams, consistent with benign etiology. No suspicious pulmonary nodules or masses identified. No evidence of pleural effusion. Musculoskeletal:  No suspicious bone lesions identified. CT ABDOMEN AND PELVIS FINDINGS Hepatobiliary: No masses identified. Stable small right hepatic lobe cyst. Gallbladder is unremarkable. Pancreas:  No mass or  inflammatory changes. Spleen:  Within normal limits in size and appearance. Adrenals/Urinary tract:  No masses or hydronephrosis. Stomach/Bowel: No evidence of obstruction, inflammatory process, or abnormal fluid collections. Diverticulosis is seen mainly involving the sigmoid colon, however there is no evidence of diverticulitis. Normal appendix visualized. Vascular/Lymphatic: No pathologically enlarged lymph nodes identified. No abdominal aortic aneurysm. Aortic  atherosclerosis. Reproductive: Previous hysterectomy. No adnexal or other pelvic masses identified. No evidence of ascites. Other:  None. Musculoskeletal:  No suspicious bone lesions identified. IMPRESSION: 1. Stable exam. No evidence of recurrent or metastatic carcinoma within the chest, abdomen, or pelvis. 2. Colonic diverticulosis, without radiographic evidence of diverticulitis. Electronically Signed   By: Earle Gell M.D.   On: 05/22/2018 11:07    All questions were answered. The patient knows to call the clinic with any problems, questions or concerns. No barriers to learning was detected.  I spent 25 minutes counseling the patient face to face. The total time spent in the appointment was 30 minutes and more than 50% was on counseling and review of test results  Heath Lark, MD 05/22/2018 1:02 PM

## 2018-05-22 NOTE — Assessment & Plan Note (Signed)
CT imaging showed no evidence of disease recurrence She has appointment to see radiation oncologist next month I will schedule appointment to see me in 6 months along with imaging study

## 2018-05-22 NOTE — Assessment & Plan Note (Signed)
She has no signs of cancer recurrence in her lungs Nevertheless, she is at high risk of disease recurrence I plan to repeat imaging study in 6 months

## 2018-06-09 ENCOUNTER — Telehealth: Payer: Self-pay | Admitting: *Deleted

## 2018-06-09 NOTE — Telephone Encounter (Signed)
CALLED PATIENT TO ALTER FU APPT. ON 06-12-18 DUE TO DR. KINARD BEING IN THE OR, RESCHEDULED FOR 07-14-18 @ 10:15 AM, PATIENT AGREED TO NEW TIME AND DATE

## 2018-06-12 ENCOUNTER — Ambulatory Visit: Payer: Medicare Other | Admitting: Radiation Oncology

## 2018-07-14 ENCOUNTER — Ambulatory Visit
Admission: RE | Admit: 2018-07-14 | Discharge: 2018-07-14 | Disposition: A | Discharge status: Home / Self Care | Payer: 59 | Source: Ambulatory Visit | Attending: Radiation Oncology | Admitting: Radiation Oncology

## 2018-07-14 ENCOUNTER — Other Ambulatory Visit: Payer: Self-pay

## 2018-07-14 ENCOUNTER — Encounter: Payer: Self-pay | Admitting: Radiation Oncology

## 2018-07-14 VITALS — BP 129/56 | HR 81 | Temp 98.8°F | Resp 20 | Ht 60.0 in | Wt 181.8 lb

## 2018-07-14 DIAGNOSIS — Z923 Personal history of irradiation: Secondary | ICD-10-CM | POA: Insufficient documentation

## 2018-07-14 DIAGNOSIS — Z79899 Other long term (current) drug therapy: Secondary | ICD-10-CM | POA: Insufficient documentation

## 2018-07-14 DIAGNOSIS — R102 Pelvic and perineal pain: Secondary | ICD-10-CM | POA: Diagnosis not present

## 2018-07-14 DIAGNOSIS — R140 Abdominal distension (gaseous): Secondary | ICD-10-CM | POA: Insufficient documentation

## 2018-07-14 DIAGNOSIS — C541 Malignant neoplasm of endometrium: Secondary | ICD-10-CM | POA: Diagnosis not present

## 2018-07-14 DIAGNOSIS — C7800 Secondary malignant neoplasm of unspecified lung: Secondary | ICD-10-CM

## 2018-07-14 DIAGNOSIS — R110 Nausea: Secondary | ICD-10-CM | POA: Insufficient documentation

## 2018-07-14 DIAGNOSIS — N939 Abnormal uterine and vaginal bleeding, unspecified: Secondary | ICD-10-CM | POA: Insufficient documentation

## 2018-07-14 DIAGNOSIS — Z90710 Acquired absence of both cervix and uterus: Secondary | ICD-10-CM | POA: Diagnosis not present

## 2018-07-14 LAB — CMP (CANCER CENTER ONLY)
ALT: 24 U/L (ref 0–44)
AST: 16 U/L (ref 15–41)
Albumin: 3.8 g/dL (ref 3.5–5.0)
Alkaline Phosphatase: 57 U/L (ref 38–126)
Anion gap: 9 (ref 5–15)
BUN: 10 mg/dL (ref 8–23)
CO2: 26 mmol/L (ref 22–32)
Calcium: 9.2 mg/dL (ref 8.9–10.3)
Chloride: 107 mmol/L (ref 98–111)
Creatinine: 0.83 mg/dL (ref 0.44–1.00)
GFR, Est AFR Am: >60 mL/min (ref >60)
GFR, Estimated: >60 mL/min (ref >60)
Glucose, Bld: 92 mg/dL (ref 70–99)
Potassium: 4.3 mmol/L (ref 3.5–5.1)
Sodium: 142 mmol/L (ref 135–145)
Total Bilirubin: 0.7 mg/dL (ref 0.3–1.2)
Total Protein: 6.7 g/dL (ref 6.5–8.1)

## 2018-07-14 LAB — CBC WITH DIFFERENTIAL (CANCER CENTER ONLY)
Abs Immature Granulocytes: 0.01 10*3/uL (ref 0.00–0.07)
Basophils Absolute: 0.1 10*3/uL (ref 0.0–0.1)
Basophils Relative: 2 %
Eosinophils Absolute: 0.2 10*3/uL (ref 0.0–0.5)
Eosinophils Relative: 5 %
HCT: 40.1 % (ref 36.0–46.0)
Hemoglobin: 13.1 g/dL (ref 12.0–15.0)
Immature Granulocytes: 0 %
Lymphocytes Relative: 19 %
Lymphs Abs: 0.8 10*3/uL (ref 0.7–4.0)
MCH: 30 pg (ref 26.0–34.0)
MCHC: 32.7 g/dL (ref 30.0–36.0)
MCV: 91.8 fL (ref 80.0–100.0)
Monocytes Absolute: 0.6 10*3/uL (ref 0.1–1.0)
Monocytes Relative: 13 %
Neutro Abs: 2.8 10*3/uL (ref 1.7–7.7)
Neutrophils Relative %: 61 %
Platelet Count: 243 10*3/uL (ref 150–400)
RBC: 4.37 MIL/uL (ref 3.87–5.11)
RDW: 12.9 % (ref 11.5–15.5)
WBC Count: 4.5 10*3/uL (ref 4.0–10.5)
nRBC: 0 % (ref 0.0–0.2)

## 2018-07-14 NOTE — Patient Instructions (Signed)
Coronavirus (COVID-19) Are you at risk?  Are you at risk for the Coronavirus (COVID-19)?  To be considered HIGH RISK for Coronavirus (COVID-19), you have to meet the following criteria:  . Traveled to [place], [place], [name], [place] or [place]; or in the [place] to [place], [place], [place], or [place]; and have fever, cough, and shortness of breath within the last 2 weeks of travel OR . Been in close contact with a person diagnosed with COVID-19 within the last 2 weeks and have fever, cough, and shortness of breath . IF YOU DO NOT MEET THESE CRITERIA, YOU ARE CONSIDERED LOW RISK FOR COVID-19.  What to do if you are HIGH RISK for COVID-19?  . If you are having a medical emergency, call 911. . Seek medical care right away. Before you go to a doctor's office, urgent care or emergency department, call ahead and tell them about your recent travel, contact with someone diagnosed with COVID-19, and your symptoms. You should receive instructions from your physician's office regarding next steps of care.  . When you arrive at healthcare provider, tell the healthcare staff immediately you have returned from visiting [place], [place], [place], [place] or [name]; or traveled in the [place] to [place], [place], [place], or [place]; in the last two weeks or you have been in close contact with a person diagnosed with COVID-19 in the last 2 weeks.   . Tell the health care staff about your symptoms: fever, cough and shortness of breath. . After you have been seen by a medical provider, you will be either: o Tested for (COVID-19) and discharged home on quarantine except to seek medical care if symptoms worsen, and asked to  - Stay home and avoid contact with others until you get your results (4-5 days)  - Avoid travel on public transportation if possible (such as bus, train, or airplane) or o Sent to the Emergency Department by EMS for evaluation, COVID-19 testing, and possible  admission depending on your condition and test results.  What to do if you are LOW RISK for COVID-19?  Reduce your risk of any infection by using the same precautions used for avoiding the common cold or flu:  . Wash your hands often with soap and warm water for at least 20 seconds.  If soap and water are not readily available, use an alcohol-based hand sanitizer with at least 60% alcohol.  . If coughing or sneezing, cover your mouth and nose by coughing or sneezing into the elbow areas of your shirt or coat, into a tissue or into your sleeve (not your hands). . Avoid shaking hands with others and consider head nods or verbal greetings only. . Avoid touching your eyes, nose, or mouth with unwashed hands.  . Avoid close contact with people who are sick. . Avoid places or events with large numbers of people in one location, like concerts or sporting events. . Carefully consider travel plans you have or are making. . If you are planning any travel outside or inside the [place], visit the CDC's Travelers' Health webpage for the latest health notices. . If you have some symptoms but not all symptoms, continue to monitor at home and seek medical attention if your symptoms worsen. . If you are having a medical emergency, call 911.   ADDITIONAL HEALTHCARE OPTIONS FOR PATIENTS  [facility] / e-Visit: [email or web address]         [facility]: [phone]  [facility]: [phone]                   [facility]: [phone]   

## 2018-07-14 NOTE — Progress Notes (Signed)
Pt presents today for f/u with Dr. Sondra Come. Pt reports very intense pain in Right pelvic area for past "couple weeks". Pt has not tried any OTC pain medications for pain, rated 8/10. Pt denies dysuria/hematuria. Pt reports light vaginal bleeding a "couple weeks ago" and that is when pain started. Bleeding has resolved. Pt reports at that time, she was doing a lot of outside, heavy work and attributes bleeding to work but does not attribute pain to that activity despite pain began next day. Pt denies rectal bleeding. Pt reports loose bowels that has been ongoing since radiation. Pt reports abdominal bloating that started with pain. Pt reports that she "feels sick when the pain gets really bad" when asked about N/V.  BP (!) 129/56 (BP Location: Left Arm, Patient Position: Sitting)   Pulse 81   Temp 98.8 F (37.1 C) (Oral)   Resp 20   Ht 5' (1.524 m)   Wt 181 lb 12.8 oz (82.5 kg)   SpO2 97%   BMI 35.51 kg/m   Wt Readings from Last 3 Encounters:  07/14/18 181 lb 12.8 oz (82.5 kg)  05/22/18 184 lb (83.5 kg)  03/14/18 177 lb 8 oz (80.5 kg)   Loma Sousa, RN BSN

## 2018-07-14 NOTE — Progress Notes (Signed)
Radiation Oncology         [phone] ________________________________  Name: Ashley Villegas MRN: [id number]  Date: 07/14/2018  DOB: [birth year]  Follow-Up Visit Note  CC: [name], MD  [name], MD    ICD-10-CM   1. Endometrial cancer ([place]) C54.1 CMP ([facility] only)    CBC with Differential (Cancer Center Only)    Diagnosis:  68 y.o.woman with recurrent endometrial cancer, stage IA grade 1, s/p hysterectomy.     Interval Since Last Radiation:  1 year and 6.5 months  11/08/2016 - 12/27/2016: 1. The pelvis was treated to 45 Gy in 25 fractions of 1.8 Gy. 2. The residual pelvic mass was boosted to 14 Gy in 7 fractions of 2 Gy. (Most of recurrence was outside vaginal vault so therefore brachii therapy was not used for a boost)   Narrative:  The patient returns today for routine follow-up. She reports very intense pain in her right pelvic area for the past "couple weeks," rated 8/10. She notes she has not tried any OTC pain medications. She reports light vaginal bleeding that began at the same time as the pelvic pain, but notes the bleeding has resolved. She states at the time of these issues starting, she was doing a lot of heavy outside work. She attributes the bleeding to these activities but not the pain despite the pain beginning the following day. She also reports associated abdominal bloating and nausea with the pelvic pain, stating she "feels sick when the pain gets really bad."  Since her last visit, she underwent bone scan on 03/10/2018 after presenting with new bone pain. The scan showed no scintigraphic evidence of osseous metastatic disease.  She also underwent bilateral screening mammogram on 04/01/2018, which showed no mammographic evidence of malignancy.   She also underwent chest/abdomen/pelvis CT scans on 05/21/2018, which was stable. No evidence of recurrent or metastatic carcinoma within the chest, abdomen, or pelvis.  On further review of systems,  she denies dysuria or hematuria and rectal bleeding. She reports continued loose bowels since radiation. She denies any other symptoms than those mentioned above.                     ALLERGIES:  is allergic to hydrocodone.  Meds: Current Outpatient Medications  Medication Sig Dispense Refill  . cetirizine (ZYRTEC) 10 MG tablet Take 10 mg by mouth daily.    . Cholecalciferol (VITAMIN D) 50 MCG (2000 UT) CAPS Take by mouth.    Marland Kitchen FLUoxetine (PROZAC) 40 MG capsule Take 40 mg by mouth daily.    Marland Kitchen levothyroxine (SYNTHROID, LEVOTHROID) 112 MCG tablet Take 112 mcg by mouth daily before breakfast.      No current facility-administered medications for this encounter.     Physical Findings: The patient is in no acute distress. Patient is alert and oriented.  height is 5' (1.524 m) and weight is 181 lb 12.8 oz (82.5 kg). Her oral temperature is 98.8 F (37.1 C). Her blood pressure is 129/56 (abnormal) and her pulse is 81. Her respiration is 20 and oxygen saturation is 97%. . Lungs are clear to auscultation bilaterally. Heart has regular rate and rhythm. No palpable cervical, supraclavicular, or axillary adenopathy. Abdomen soft, non-tender, normal bowel sounds.  On pelvic examination the external genitalia were unremarkable. A speculum exam was performed. There are no mucosal lesions noted in the vaginal vault. On bimanual and rectovaginal examination there were no pelvic masses appreciated.  Exam was generally uncomfortable for the patient.  Lab Findings: Lab Results  Component Value Date   WBC 4.5 07/14/2018   HGB 13.1 07/14/2018   HCT 40.1 07/14/2018   MCV 91.8 07/14/2018   PLT 243 07/14/2018    Radiographic Findings: No results found.  Impression:  Recurrent Endometrial Cancer The patient, since completing her radiation therapy, has developed an additional recurrence in the lung. She was treated with immunotherapy, which has placed her in remission. This was discontinued in light of her  side effects. The patient did have CT scans of the chest/adbomen/pelvis approximately 2 months ago, which showed no evidence of recurrence.   Plan:  The etiology of the patient's right pelvic pain is unknown at this time. Given her high risk for recurrence, we will repeat CT chest/abdomen/pelvis for further evaluation. Otherwise, routine follow up in 6 months, and the patient will be seen by gynecologic oncology in 3 months. Patient is scheduled to see Dr. Alvy Bimler on 11/17/2018.    ____________________________________ -----------------------------------  Blair Promise, PhD, MD   This document serves as a record of services personally performed by Gery Pray, MD. It was created on his behalf by Wilburn Mylar, a trained medical scribe. The creation of this record is based on the scribe's personal observations and the provider's statements to them. This document has been checked and approved by the attending provider.

## 2018-07-15 ENCOUNTER — Telehealth: Payer: Self-pay | Admitting: Oncology

## 2018-07-15 NOTE — Telephone Encounter (Signed)
Scheduled apt with Dr. Alvy Bimler on 07/22/18 at 11:15.  Ashley Villegas verbalized agreement.

## 2018-07-15 NOTE — Telephone Encounter (Signed)
Ashley Villegas with appointment for CT on 07/21/18 at 11 am at [facility] with instructions to arrive at 9 am to drink water based contrast and to not eat or drink anything 4 hours before.  She verbalized understanding and agreement.

## 2018-07-21 ENCOUNTER — Encounter (HOSPITAL_COMMUNITY): Payer: Self-pay

## 2018-07-21 ENCOUNTER — Ambulatory Visit (HOSPITAL_COMMUNITY)
Admission: RE | Admit: 2018-07-21 | Discharge: 2018-07-21 | Disposition: A | Discharge status: Home / Self Care | Payer: 59 | Source: Ambulatory Visit | Attending: Radiation Oncology | Admitting: Radiation Oncology

## 2018-07-21 ENCOUNTER — Ambulatory Visit (HOSPITAL_COMMUNITY): Payer: Medicare Other

## 2018-07-21 ENCOUNTER — Other Ambulatory Visit: Payer: Self-pay

## 2018-07-21 DIAGNOSIS — C541 Malignant neoplasm of endometrium: Secondary | ICD-10-CM | POA: Insufficient documentation

## 2018-07-21 MED ORDER — SODIUM CHLORIDE (PF) 0.9 % IJ SOLN
INTRAMUSCULAR | Status: AC
  Filled 2018-07-21: qty 50

## 2018-07-21 MED ORDER — IOHEXOL 300 MG/ML  SOLN
30.0000 mL | Freq: Once | INTRAMUSCULAR | Status: AC | PRN
  Administered 2018-07-21: 30 mL via ORAL

## 2018-07-21 MED ORDER — IOHEXOL 300 MG/ML  SOLN
100.0000 mL | Freq: Once | INTRAMUSCULAR | Status: AC | PRN
  Administered 2018-07-21: 100 mL via INTRAVENOUS

## 2018-07-22 ENCOUNTER — Other Ambulatory Visit: Payer: Self-pay

## 2018-07-22 ENCOUNTER — Encounter: Payer: Self-pay | Admitting: Hematology and Oncology

## 2018-07-22 ENCOUNTER — Inpatient Hospital Stay: Payer: 59 | Attending: Gynecologic Oncology | Admitting: Hematology and Oncology

## 2018-07-22 DIAGNOSIS — C541 Malignant neoplasm of endometrium: Secondary | ICD-10-CM

## 2018-07-22 DIAGNOSIS — Z79899 Other long term (current) drug therapy: Secondary | ICD-10-CM | POA: Diagnosis not present

## 2018-07-22 DIAGNOSIS — C7800 Secondary malignant neoplasm of unspecified lung: Secondary | ICD-10-CM | POA: Diagnosis not present

## 2018-07-22 DIAGNOSIS — M1990 Unspecified osteoarthritis, unspecified site: Secondary | ICD-10-CM

## 2018-07-22 DIAGNOSIS — Z9221 Personal history of antineoplastic chemotherapy: Secondary | ICD-10-CM | POA: Insufficient documentation

## 2018-07-22 DIAGNOSIS — M064 Inflammatory polyarthropathy: Secondary | ICD-10-CM | POA: Diagnosis not present

## 2018-07-22 DIAGNOSIS — Z923 Personal history of irradiation: Secondary | ICD-10-CM | POA: Insufficient documentation

## 2018-07-22 NOTE — Progress Notes (Signed)
[place] OFFICE PROGRESS NOTE  Patient Care Team: [name], MD as PCP - General (Internal Medicine) Jacelyn Pi, MD as Consulting Physician (Endocrinology) Heath Lark, MD as Consulting Physician (Hematology and Oncology)  ASSESSMENT & PLAN:  Endometrial cancer [facility]) I have reviewed CT imaging with the patient She has no signs of cancer recurrence I could not think of the cause of her recent symptoms Recent vaginal bleeding could be related to dry vaginal area causing bruising The patient is reassured  Metastasis to lung [facility]) CT imaging of the lung show no evidence of metastasis Her previous lung nodule stable  Inflammatory arthritis The cause of her right groin pain is unknown Imaging study is reviewed with great detail which showed no evidence of bone lesion It could be flare of arthritis I recommend close follow-up with her primary care doctor for further management or consideration for referral to orthopedic surgery for further evaluation   No orders of the defined types were placed in this encounter.   INTERVAL HISTORY: Please see below for problem oriented charting. She returns to review test results Since last time I saw her, she continues to have intermittent right hip pain She denies further vaginal bleeding CT imaging was ordered by radiation oncologist due to recent symptoms of vaginal bleeding and right groin pain She denies recent cough, chest pain or shortness of breath SUMMARY OF ONCOLOGIC HISTORY: Oncology History   Abnormal MSI, germline mutation was negative Endometrioid     Endometrial cancer ([place])   09/15/2014 Pathology Results    Endometrium, curettage - ENDOMETRIAL ADENOCARCINOMA. - SEE COMMENT. Microscopic Comment Although definitive characterization is best performed on resection specimen, as sampled, the endometrial adenocarcinoma appears to be endometrioid subtype, FIGO grade 1.     09/15/2014 Surgery    PreOp:  postmenopausal bleeding, cervical stenosis PostOp: same and uterine polyp Procedure:  Hysteroscopy, Dilation and Curettage, Myosure polypectomy Surgeon: Dr. Janyth Pupa  Findings:8cm uterus with thickened polypoid-like endometrium, large 2cm irregular appearing polyp  Specimens: 1) endometrial curettings    10/26/2014 Pathology Results    1. Uterus +/- tubes/ovaries, neoplastic - INVASIVE ADENOCARCINOMA, ENDOMETRIOID TYPE, SEE COMMENT. - TUMOR INVOLVES LESS THAN ONE HALF MYOMETRIAL THICKNESS. - TUMOR INVOLVES UTERINE ADENOMYOSIS. - BENIGN LEIOMYOMATA (UP TO 2.5 CM). - BENIGN CERVICAL MUCOSA; NEGATIVE FOR INTRAEPITHELIAL LESION OR MALIGNANCY. - BENIGN RIGHT AND LEFT OVARIES; NEGATIVE FOR ATYPIA OR MALIGNANCY. - BENIGN RIGHT AND LEFT FALLOPIAN TUBE; NEGATIVE FOR ATYPIA OR MALIGNANCY. - BENIGN PARATUBAL CYSTS; NEGATIVE FOR ATYPIA OR MALIGNANCY. - SEE TUMOR SYNOPTIC TEMPLATE BELOW. 2. Lymph nodes, regional resection, right pelvic - FOUR LYMPH NODES, NEGATIVE FOR TUMOR (0/4). 3. Lymph nodes, regional resection, left pelvic - FOUR LYMPH NODES, NEGATIVE FOR TUMOR (0/4). Microscopic Comment 1. ONCOLOGY TABLE-UTERUS, CARCINOMA OR CARCINOSARCOMA Specimen: Uterus and bilateral fallopian tubes and ovaries. Procedure: Hysterectomy and bilateral salpingo-oophorectomy. Lymph node sampling performed: Yes. Specimen integrity: Intact. Maximum tumor size: 4.0 cm (tumor involved entire endometrium) Histologic type: Adenocarcinoma, endometrioid type. Grade: 1 Myometrial invasion: 0.5 cm where myometrium is 1.5 cm in thickness Cervical stromal involvement: Absent. Extent of involvement of other organs: None Lymph - vascular invasion: Absent. Peritoneal washings: N/A Lymph nodes: # examined - 8 ; # positive - 0 Pelvic lymph nodes: N/A involved of N/A lymph nodes. Para-aortic lymph nodes: N/A involved of N/A lymph nodes. Other (specify involvement and site): N/A TNM code: pT1a, pN0 FIGO Stage  (based on pathologic findings, needs clinical correlation): N/A Comments: None MSI testing abnormal  10/26/2014 Surgery    Surgery: Total robotic hysterectomy, bilateral salpingo-oophorectomy, bilateral pelvic lymph node dissection.   Surgeons:  Lucita Lora. Alycia Rossetti, MD; Lahoma Crocker, MD   Pathology: Uterus, cervix, bilateral tubes and ovaries, bilateral pelvic lymph nodes to pathology.   Operative findings: Omentum adherent to midline vertical incision. Fibroid uterus. Normal adnexa. Adhesions of anterior bladder to uterus. Frozen section with grade 1 cancer focally involving adenomyosis, minimal myometrial invasion.    11/17/2014 Imaging    Ct abdomen 1. Status post hysterectomy. Pelvic edema which is greater than typically seen 3 weeks postop. Suspicious for postoperative infection. 2. Bladder wall thickening and irregularity. Although this could be partially due to underdistention, Concurrent cystitis cannot be excluded. 3. Bilateral fluid density lesions along the pelvic sidewalls. Favored to represent seromas or lymphangiomas. No specific features to suggest abscess. If the patient's symptoms persist after appropriate antibiotic therapy, aspiration of the largest left pelvic sidewall "Lesion" should be considered. 4. Suspicion of mild hepatic steatosis. Indeterminate right hepatic lobe lesion. If definitive characterization is desired in this patient with history of primary malignancy, nonemergent pre and post contrast abdominal MRI could be performed. 5. Hiatal hernia.    11/25/2014 Imaging    1. Persistent bilateral pelvic sidewall fluid collections, left greater than right, with left-sided inflammatory changes. 2. Technically successful 12 French left pelvic abscess drain catheter placement. A sample of the aspirate was sent for Gram stain, culture and sensitivity.    12/08/2014 Imaging    CT abdomen and pelvis 1. Complete resolution of dominant fluid collection within the left  hemipelvis following percutaneous drainage catheter placement. This percutaneous drainage catheter was subsequent removed intact at the patient's bedside. 2. Continued decrease in size of bilobed fluid collections within the right hemipelvis with superficial component measuring 1.6 cm and posterior component measuring 2.6 cm, indeterminate though both favored to represent evolving seromas. 3. Unchanged indeterminate approximately 1.2 cm hepatic lesion. Further evaluation with contrast-enhanced abdominal MRI could be performed as clinically indicated. 4. Colonic diverticulosis without evidence of diverticulitis.    10/19/2016 Imaging    New 2.6 cm soft tissue mass involving right vaginal cuff, consistent with locally recurrent carcinoma.  No other sites of metastatic disease identified.  Colonic diverticulosis, without radiographic evidence of diverticulitis.  Mild hepatic steatosis.     10/24/2016 Relapse/Recurrence    Vaginal cuff recurrence. No evidence of distant disease.    10/24/2016 Pathology Results    Vagina, biopsy, right apex - ADENOCARCINOMA - SEE COMMENT Microscopic Comment The morphologic features are consistent with the patient's previously diagnosed endometrioid adenocarcinoma    11/08/2016 - 12/27/2016 Radiation Therapy    45 Gy in 25 fractions of 1.8 Gy. The residual pelvic mass was boosted to 14 Gy in 7 fractions of 2 Gy. No intracavitary lesion seen at completion of pelvic fields to boost with brachytherapy.    06/12/2017 Imaging    CT abdomen and pelvis 1. New 1.1 by 0.8 cm right lower lobe pulmonary nodule, concerning for pulmonary metastatic disease. Possibilities for further assessment include biopsy or nuclear medicine PET-CT to assess this lesion and the rest of the neck/chest/abdomen/pelvis for other potential hypermetabolic lesions. 2. The previous soft tissue mass along the right vaginal cuff is no longer present. 3. Stable and likely benign right hepatic lobe  hypodense lesion. 4. Other imaging findings of potential clinical significance: Sigmoid colon diverticulosis. Aortic Atherosclerosis (ICD10-I70.0). Multilevel lumbar degenerative disc disease. Mild cardiomegaly. Small type 1 hiatal hernia.    07/05/2017 Imaging  Ct chest Several bilateral pulmonary nodules measuring up to 11 mm, highly suspicious for pulmonary metastases.   No evidence of lymphadenopathy or pleural effusion.  Aortic Atherosclerosis (ICD10-I70.0).    07/25/2017 Imaging    Status post CT-guided lung nodule biopsy. Tissue specimen sent to pathology for complete histopathologic analysis.    07/25/2017 Pathology Results    Lung, needle/core biopsy(ies), RLL - METASTATIC ADENOCARCINOMA - SEE COMMENT Microscopic Comment By immunohistochemistry, the neoplastic cells are positive for Pax-8 and ER but negative for TTF-1. Overall, this immunoprofile is consistent with metastasis from the patient's known endometrioid adenocarcinoma    07/30/2017 Cancer Staging    Staging form: Corpus Uteri - Carcinoma and Carcinosarcoma, AJCC 8th Edition - Clinical: Stage IVB (rcT1, cN0, pM1) - Signed by Heath Lark, MD on 07/30/2017    08/09/2017 - 11/01/2017 Chemotherapy    The patient had pembrolizumab for chemotherapy treatment.     10/10/2017 Imaging    1. Interval decrease in size and contour. The of previously described right and left lower lobe pulmonary nodules. 2. Interval increase in size of mediastinal and hilar adenopathy, potentially metastatic in etiology. 3. Aortic Atherosclerosis (ICD10-I70.0).    11/08/2017 - 11/10/2017 Hospital Admission    She was admitted to the hospital due to uncontrolled pain    11/29/2017 Imaging    Chest Impression:  No thoracic metastasis. No discrete pulmonary nodules identified.  Abdomen / Pelvis Impression:  No evidence of local recurrence or metastatic endometrial carcinoma in the abdomen pelvis.    02/21/2018 Imaging    1. No findings to suggest  metastatic disease in the chest, abdomen or pelvis. 2. Mild colonic diverticulosis without evidence of acute diverticulitis at this time. 3. Aortic atherosclerosis. 4. Additional incidental findings, as above.    03/10/2018 Imaging    Bone scan: No scintigraphic evidence of osseous metastatic disease.    05/22/2018 Imaging    1. Stable exam. No evidence of recurrent or metastatic carcinoma within the chest, abdomen, or pelvis. 2. Colonic diverticulosis, without radiographic evidence of diverticulitis.     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      Metastasis to lung ([place])   07/30/2017 Initial Diagnosis    Metastasis to lung ([place])    08/09/2017 - 11/21/2017 Chemotherapy    The patient had pembrolizumab (KEYTRUDA) 200 mg in sodium chloride 0.9 % 50 mL chemo infusion, 200 mg, Intravenous, Once, 5 of 7 cycles Administration: 200 mg (08/09/2017), 200 mg (08/30/2017), 200 mg (09/20/2017), 200 mg (10/11/2017), 200 mg (11/01/2017)  for chemotherapy treatment.      REVIEW OF SYSTEMS:   Constitutional: Denies fevers, chills or abnormal weight loss Eyes: Denies blurriness of vision Ears, nose, mouth, throat, and face: Denies mucositis or sore throat Respiratory: Denies cough, dyspnea or wheezes Cardiovascular: Denies palpitation, chest discomfort or lower extremity swelling Gastrointestinal:  Denies nausea, heartburn or change in bowel habits Skin: Denies abnormal skin rashes Lymphatics: Denies new lymphadenopathy or easy bruising Neurological:Denies numbness, tingling or new weaknesses Behavioral/Psych: Mood is  stable, no new changes  All other systems were reviewed with the patient and are negative.  I have reviewed the  past medical history, past surgical history, social history and family history with the patient and they are unchanged from previous note.  ALLERGIES:  is allergic to hydrocodone.  MEDICATIONS:  Current Outpatient Medications  Medication Sig Dispense Refill  . cetirizine (ZYRTEC) 10 MG tablet Take 10 mg by mouth daily.    . Cholecalciferol (VITAMIN D) 50 MCG (2000 UT) CAPS Take by mouth.    Marland Kitchen FLUoxetine (PROZAC) 40 MG capsule Take 40 mg by mouth daily.    Marland Kitchen levothyroxine (SYNTHROID, LEVOTHROID) 112 MCG tablet Take 100 mcg by mouth daily before breakfast.     No current facility-administered medications for this visit.     PHYSICAL EXAMINATION: ECOG PERFORMANCE STATUS: 0 - Asymptomatic  Vitals:   07/22/18 1133  BP: 119/66  Pulse: 76  Resp: 18  Temp: 98.1 F (36.7 C)  SpO2: 98%   Filed Weights   07/22/18 1133  Weight: 182 lb (82.6 kg)    GENERAL:alert, no distress and comfortable Musculoskeletal:no cyanosis of digits and no clubbing  NEURO: alert & oriented x 3 with fluent speech, no focal motor/sensory deficits  LABORATORY DATA:  I have reviewed the data as listed    Component Value Date/Time   NA 142 07/14/2018 1211   K 4.3 07/14/2018 1211   CL 107 07/14/2018 1211   CO2 26 07/14/2018 1211   GLUCOSE 92 07/14/2018 1211   BUN 10 07/14/2018 1211   BUN 18.7 10/17/2016 1353   CREATININE 0.83 07/14/2018 1211   CREATININE 0.8 10/17/2016 1353   CALCIUM 9.2 07/14/2018 1211   PROT 6.7 07/14/2018 1211   ALBUMIN 3.8 07/14/2018 1211   AST 16 07/14/2018 1211   ALT 24 07/14/2018 1211   ALKPHOS 57 07/14/2018 1211   BILITOT 0.7 07/14/2018 1211   GFRNONAA >60 07/14/2018 1211   GFRAA >60 07/14/2018 1211    No results found for: SPEP, UPEP  Lab Results  Component Value Date   WBC 4.5 07/14/2018   NEUTROABS 2.8 07/14/2018   HGB 13.1 07/14/2018   HCT 40.1  07/14/2018   MCV 91.8 07/14/2018   PLT 243 07/14/2018      Chemistry      Component Value Date/Time   NA 142 07/14/2018 1211   K 4.3 07/14/2018 1211   CL 107 07/14/2018 1211   CO2 26 07/14/2018 1211   BUN 10 07/14/2018 1211   BUN 18.7 10/17/2016 1353   CREATININE 0.83 07/14/2018 1211   CREATININE 0.8 10/17/2016 1353      Component Value Date/Time   CALCIUM 9.2 07/14/2018 1211   ALKPHOS 57 07/14/2018 1211   AST 16 07/14/2018 1211   ALT 24 07/14/2018 1211   BILITOT 0.7 07/14/2018 1211       RADIOGRAPHIC STUDIES: I have reviewed multiple imaging studies with the patient in great detail I have personally reviewed the radiological images as listed and agreed with the findings in the report. Ct Chest W Contrast  Result Date: 07/21/2018 CLINICAL DATA:  Endometrial cancer diagnosed 2016, radiation therapy finished October 2018, immunotherapy completed October 2019. Recent right lower quadrant abdominal pain for 3 weeks. EXAM: CT CHEST, ABDOMEN, AND PELVIS WITH CONTRAST TECHNIQUE: Multidetector CT imaging of the chest, abdomen and pelvis was performed following the standard protocol during bolus administration of intravenous contrast. CONTRAST:  158m OMNIPAQUE IOHEXOL 300 MG/ML SOLN, 338mOMNIPAQUE IOHEXOL 300 MG/ML SOLN COMPARISON:  Multiple exams, including 05/21/2018 FINDINGS: CT CHEST FINDINGS Cardiovascular: Atherosclerotic calcification of the aortic arch. Mild cardiomegaly. Mediastinum/Nodes: Unremarkable Lungs/Pleura: Mild biapical  pleuroparenchymal scarring. Chronically stable 3 mm right upper lobe pulmonary nodule on image 37/7. Mild scattered scarring, unchanged. Musculoskeletal: Bifid right fourth rib. Mild lower thoracic spondylosis. CT ABDOMEN PELVIS FINDINGS Hepatobiliary: Probable hepatic steatosis. 1.4 by 1.0 cm right hepatic lobe hypodense lesion on image 54/2, previously the same by my measurements back through 11/17/2014. This has been characterized as a cyst on prior exam  reports. Gallbladder unremarkable. Pancreas: Unremarkable Spleen: Unremarkable Adrenals/Urinary Tract: Adrenal glands normal. 0.5 cm hypodense lesion of the right kidney upper pole is technically too small to characterize although statistically likely to be benign urinary bladder unremarkable. Stomach/Bowel: Mild sigmoid colon diverticulosis. Normal appendix. Ileocecal valve and terminal ileum appear unremarkable. Vascular/Lymphatic: Aortoiliac atherosclerotic vascular disease. No current pathologic adenopathy. Reproductive: Uterus absent.  Adnexa unremarkable. Other: No supplemental non-categorized findings. Musculoskeletal: Lumbar spondylosis and degenerative disc disease with suspected right foraminal impingement at L4-5 and L5-S1, and borderline left foraminal impingement at all levels between L3 and S1. IMPRESSION: 1. No current findings of active malignancy. 2. A cause for the patient's right lower quadrant abdominal pain is not identified. 3. Other imaging findings of potential clinical significance: Aortic Atherosclerosis (ICD10-I70.0) and Emphysema (ICD10-J43.9). Mild cardiomegaly. Chronically stable 3 mm right upper lobe pulmonary nodule. Hypodense right hepatic lobe lesions stable from 2016, considered benign and probably a cyst. Suspected hepatic steatosis. Mild sigmoid colon diverticulosis. Lower lumbar foraminal impingement due to spondylosis and degenerative disc disease. Electronically Signed   By: Van Clines M.D.   On: 07/21/2018 15:05   Ct Abdomen Pelvis W Contrast  Result Date: 07/21/2018 CLINICAL DATA:  Endometrial cancer diagnosed 2016, radiation therapy finished October 2018, immunotherapy completed October 2019. Recent right lower quadrant abdominal pain for 3 weeks. EXAM: CT CHEST, ABDOMEN, AND PELVIS WITH CONTRAST TECHNIQUE: Multidetector CT imaging of the chest, abdomen and pelvis was performed following the standard protocol during bolus administration of intravenous contrast.  CONTRAST:  163m OMNIPAQUE IOHEXOL 300 MG/ML SOLN, 366mOMNIPAQUE IOHEXOL 300 MG/ML SOLN COMPARISON:  Multiple exams, including 05/21/2018 FINDINGS: CT CHEST FINDINGS Cardiovascular: Atherosclerotic calcification of the aortic arch. Mild cardiomegaly. Mediastinum/Nodes: Unremarkable Lungs/Pleura: Mild biapical pleuroparenchymal scarring. Chronically stable 3 mm right upper lobe pulmonary nodule on image 37/7. Mild scattered scarring, unchanged. Musculoskeletal: Bifid right fourth rib. Mild lower thoracic spondylosis. CT ABDOMEN PELVIS FINDINGS Hepatobiliary: Probable hepatic steatosis. 1.4 by 1.0 cm right hepatic lobe hypodense lesion on image 54/2, previously the same by my measurements back through 11/17/2014. This has been characterized as a cyst on prior exam reports. Gallbladder unremarkable. Pancreas: Unremarkable Spleen: Unremarkable Adrenals/Urinary Tract: Adrenal glands normal. 0.5 cm hypodense lesion of the right kidney upper pole is technically too small to characterize although statistically likely to be benign urinary bladder unremarkable. Stomach/Bowel: Mild sigmoid colon diverticulosis. Normal appendix. Ileocecal valve and terminal ileum appear unremarkable. Vascular/Lymphatic: Aortoiliac atherosclerotic vascular disease. No current pathologic adenopathy. Reproductive: Uterus absent.  Adnexa unremarkable. Other: No supplemental non-categorized findings. Musculoskeletal: Lumbar spondylosis and degenerative disc disease with suspected right foraminal impingement at L4-5 and L5-S1, and borderline left foraminal impingement at all levels between L3 and S1. IMPRESSION: 1. No current findings of active malignancy. 2. A cause for the patient's right lower quadrant abdominal pain is not identified. 3. Other imaging findings of potential clinical significance: Aortic Atherosclerosis (ICD10-I70.0) and Emphysema (ICD10-J43.9). Mild cardiomegaly. Chronically stable 3 mm right upper lobe pulmonary nodule.  Hypodense right hepatic lobe lesions stable from 2016, considered benign and probably a cyst. Suspected hepatic steatosis. Mild sigmoid colon diverticulosis. Lower lumbar  foraminal impingement due to spondylosis and degenerative disc disease. Electronically Signed   By: Van Clines M.D.   On: 07/21/2018 15:05    All questions were answered. The patient knows to call the clinic with any problems, questions or concerns. No barriers to learning was detected.  I spent 15 minutes counseling the patient face to face. The total time spent in the appointment was 20 minutes and more than 50% was on counseling and review of test results  Heath Lark, MD 07/22/2018 12:10 PM

## 2018-07-22 NOTE — Assessment & Plan Note (Signed)
The cause of her right groin pain is unknown Imaging study is reviewed with great detail which showed no evidence of bone lesion It could be flare of arthritis I recommend close follow-up with her primary care doctor for further management or consideration for referral to orthopedic surgery for further evaluation

## 2018-07-22 NOTE — Assessment & Plan Note (Signed)
CT imaging of the lung show no evidence of metastasis Her previous lung nodule stable

## 2018-07-22 NOTE — Assessment & Plan Note (Signed)
I have reviewed CT imaging with the patient Ashley Villegas has no signs of cancer recurrence I could not think of the cause of her recent symptoms Recent vaginal bleeding could be related to dry vaginal area causing bruising The patient is reassured

## 2018-08-27 ENCOUNTER — Ambulatory Visit: Payer: 59 | Admitting: Obstetrics

## 2018-10-14 NOTE — Progress Notes (Signed)
Consult Note: Gyn-Onc  Ashley Villegas 68 y.o. female  CC:  Chief Complaint  Patient presents with  . Follow-up    HPI: Ashley Villegas is a 68 y.o. year old No obstetric history on file. initially seen in consultation on 09/30/14, referred by Dr Nelda Marseille for grade 1 endometrial cancer.  She then underwent a robotic hysterectomy, BSO and bilateral pelvic node dissection on 10/28/44 without complications.  Her postoperative course was uncomplicated .  Her final pathologic diagnosis is a Stage IA Grade 1 endometrioid endometrial cancer with no  lymphovascular space invasion, 5/15 mm (33%) of myometrial invasion and negative lymph nodes. Postoperatively she developed bilateral symptomatic lymphocyst. There were drained and she was treated with antibiotics.  IMPRESSION: 1. Status post hysterectomy. Pelvic edema which is greater than typically seen 3 weeks postop. Suspicious for postoperative infection. 2. Bladder wall thickening and irregularity. Although this could be partially due to underdistention, Concurrent cystitis cannot be excluded. 3. Bilateral fluid density lesions along the pelvic sidewalls. Favored to represent seromas or lymphangiomas. No specific features to suggest abscess. If the patient's symptoms persist after appropriate antibiotic therapy, aspiration of the largest left pelvic sidewall "Lesion" should be considered. 4. Suspicion of mild hepatic steatosis. Indeterminate right hepatic lobe lesion. If definitive characterization is desired in this patient with history of primary malignancy, nonemergent pre and post contrast abdominal MRI could be performed. 5. Hiatal hernia.  IMPRESSION (9/16): 1. Complete resolution of dominant fluid collection within the left hemipelvis following percutaneous drainage catheter placement. This percutaneous drainage catheter was subsequent removed intact at the patient's bedside. 2. Continued decrease in size of bilobed fluid collections  within the right hemipelvis with superficial component measuring 1.6 cm and posterior component measuring 2.6 cm, indeterminate though both favored to represent evolving seromas. 3. Unchanged indeterminate approximately 1.2 cm hepatic lesion. 4. Colonic diverticulosis without evidence of diverticulitis.  I lsaw her in March 2017. She did not keep her March 2018 appointment and called stating that she was having pelvic pain and she needed to come in. She did not see her physician Dr. Suanne Marker in between our 2 visits either until last week when she saw her on Wednesday complaining of pain. She states that her a few months ago she developed food poisoning after eating out and that's when this discomfort started. On the pain is primarily on the right is worse with sitting some days are better than others. It occasionally and rarely does wake her up at night but is mostly during the day.   I saw her in clinic at which time she was tender and I felt a fullness on RV exam towards her right side. A CT scan was obtained that revealed: IMPRESSION 10/19/16: New 2.6 cm soft tissue mass involving right vaginal cuff, consistent with locally recurrent carcinoma.No other sites of metastatic disease identified. Colonic diverticulosis, without radiographic evidence of diverticulitis. Mild hepatic steatosis.   10/24/16: Diagnosis Vagina, biopsy, right apex - ADENOCARCINOMA - SEE COMMENT Microscopic Comment  The morphologic features are consistent with the patient's previously diagnosed endometrioid adenocarcinoma.  11/08/2016-12/27/2016  1. The pelvis was treated to 45 Gy in 25 fractions of 1.8 Gy. 2. Theresidual pelvic masswas boosted to 14 Gy in 7 fractions of 2 Gy. (No intracavitary lesionseen at completion of pelvic fieldsto boost with brachytherapy).  At that time CT scan for her boost fieldrevealed the tumor mass to be shrinking and they held off on repeating diagnostic CT scan at this time unless  symptoms worsen.  She  suffered a pulmonary recurrence and was treated with pembrolizumab from 5/19-8/19 as below.  She was last seen by Dr. Alvy Bimler in April.  Her last imaging (4/20) revealed: IMPRESSION: 1. No current findings of active malignancy. 2. A cause for the patient's right lower quadrant abdominal pain is not identified. 3. Other imaging findings of potential clinical significance: Aortic Atherosclerosis (ICD10-I70.0) and Emphysema (ICD10-J43.9). Mild cardiomegaly. Chronically stable 3 mm right upper lobe pulmonary nodule. Hypodense right hepatic lobe lesions stable from 2016, considered benign and probably a cyst. Suspected hepatic steatosis. Mild sigmoid colon diverticulosis. Lower lumbar foraminal impingement due to spondylosis and degenerative disc disease.  Interval History:  She comes in today for follow-up.  She is overall doing what very well.  The biggest issue is that she is having some left-sided knee pain.  She was seen by orthopedics and states that it is "bone-on-bone".  She received a sterile void injection and that is helped.  As a result of not being able to walk she has gained some weight though she does have an aboveground pool and is been walking in her pool.  She was having some right-sided lower abdominal discomfort CT scan did not show anything of concern there.  We discussed that it could be her hip and if she is walking protecting her left knee her gait may be off.  In retrospect she thinks that this very well may be what the issue is.  There are no new medical problems in her family.  Review of Systems: Constitutional: + explainable weight gain Skin: No rash Cardiovascular: No edema  Pulmonary: No cough  Gastro Intestinal: No nausea, vomiting, constipation, or diarrhea reported.  Genitourinary: Denies vaginal bleeding and discharge.  Musculoskeletal: As above Psychology: +  Nervous about visit  Current Meds:  Outpatient Encounter Medications as of 10/21/2018   Medication Sig  . cetirizine (ZYRTEC) 10 MG tablet Take 10 mg by mouth daily.  . Cholecalciferol (VITAMIN D) 50 MCG (2000 UT) CAPS Take by mouth.  Marland Kitchen FLUoxetine (PROZAC) 40 MG capsule Take 40 mg by mouth daily.  Marland Kitchen levothyroxine (SYNTHROID, LEVOTHROID) 112 MCG tablet Take 100 mcg by mouth daily before breakfast.   No facility-administered encounter medications on file as of 10/21/2018.     Allergy:  Allergies  Allergen Reactions  . Hydrocodone Rash    Social Hx:   Social History   Socioeconomic History  . Marital status: Single    Spouse name: Not on file  . Number of children: 2  . Years of education: Not on file  . Highest education level: Not on file  Occupational History  . Occupation: Therapist, art  Social Needs  . Financial resource strain: Not on file  . Food insecurity    Worry: Not on file    Inability: Not on file  . Transportation needs    Medical: Not on file    Non-medical: Not on file  Tobacco Use  . Smoking status: Former Smoker    Packs/day: 0.50    Years: 13.00    Pack years: 6.50    Types: Cigarettes    Quit date: 10/20/2007    Years since quitting: 11.0  . Smokeless tobacco: Never Used  Substance and Sexual Activity  . Alcohol use: No  . Drug use: No  . Sexual activity: Not on file  Lifestyle  . Physical activity    Days per week: Not on file    Minutes per session: Not on file  . Stress: Not on  file  Relationships  . Social Herbalist on phone: Not on file    Gets together: Not on file    Attends religious service: Not on file    Active member of club or organization: Not on file    Attends meetings of clubs or organizations: Not on file    Relationship status: Not on file  . Intimate partner violence    Fear of current or ex partner: Not on file    Emotionally abused: Not on file    Physically abused: Not on file    Forced sexual activity: Not on file  Other Topics Concern  . Not on file  Social History Narrative  .  Not on file    Past Surgical Hx:  Past Surgical History:  Procedure Laterality Date  . CESAREAN SECTION    . COLONOSCOPY    . DIAGNOSTIC LAPAROSCOPY     fibroid tumors on her ovaries   . DILATION AND CURETTAGE OF UTERUS    . HAND SURGERY Right 2019   carpal tunnel  . HYSTEROSCOPY W/D&C N/A 09/15/2014   Procedure: DILATATION AND CURETTAGE /HYSTEROSCOPY/MYOSURE RESECTION OF POLYP;  Surgeon: Janyth Pupa, DO;  Location: [facility];  Service: Gynecology;  Laterality: N/A;  . KNEE SURGERY    . LYMPH NODE DISSECTION N/A 10/26/2014   Procedure: POSS LYMPHADENECTOMY ;  Surgeon: [name], MD;  Location: [facility];  Service: Gynecology;  Laterality: N/A;  . ROBOTIC ASSISTED TOTAL HYSTERECTOMY WITH BILATERAL SALPINGO OOPHERECTOMY Bilateral 10/26/2014   Procedure: ROBOTIC ASSISTED TOTAL HYSTERECTOMY WITH BILATERAL SALPINGO OOPHORECTOMY;  Surgeon: [name], MD;  Location: [facility];  Service: Gynecology;  Laterality: Bilateral;  . TUBAL LIGATION    . WISDOM TOOTH EXTRACTION      Past Medical Hx:  Past Medical History:  Diagnosis Date  . #[id number] dx'd 2016   endometrial cancer  . Anxiety   . Arthritis   . Complication of anesthesia   . Depression   . Family history of uterine cancer   . GERD (gastroesophageal reflux disease)   . History of radiation therapy 11/08/2016-12/27/2016   pelvis 45 Gy in 25 fractions, pelvis boost 14 gy in 7 fractions  . Hypothyroidism    on synthroid   . PONV (postoperative nausea and vomiting)     Oncology Hx:  Oncology History Overview Note  Abnormal MSI, germline mutation was negative Endometrioid   Endometrial cancer ([place])  09/15/2014 Pathology Results   Endometrium, curettage - ENDOMETRIAL ADENOCARCINOMA. - SEE COMMENT. Microscopic Comment Although definitive characterization is best performed on resection specimen, as sampled, the endometrial adenocarcinoma appears to be endometrioid subtype, FIGO grade 1.    09/15/2014 Surgery   PreOp: postmenopausal  bleeding, cervical stenosis PostOp: same and uterine polyp Procedure:  Hysteroscopy, Dilation and Curettage, Myosure polypectomy Surgeon: Dr. Janyth Pupa  Findings:8cm uterus with thickened polypoid-like endometrium, large 2cm irregular appearing polyp  Specimens: 1) endometrial curettings   10/26/2014 Pathology Results   1. Uterus +/- tubes/ovaries, neoplastic - INVASIVE ADENOCARCINOMA, ENDOMETRIOID TYPE, SEE COMMENT. - TUMOR INVOLVES LESS THAN ONE HALF MYOMETRIAL THICKNESS. - TUMOR INVOLVES UTERINE ADENOMYOSIS. - BENIGN LEIOMYOMATA (UP TO 2.5 CM). - BENIGN CERVICAL MUCOSA; NEGATIVE FOR INTRAEPITHELIAL LESION OR MALIGNANCY. - BENIGN RIGHT AND LEFT OVARIES; NEGATIVE FOR ATYPIA OR MALIGNANCY. - BENIGN RIGHT AND LEFT FALLOPIAN TUBE; NEGATIVE FOR ATYPIA OR MALIGNANCY. - BENIGN PARATUBAL CYSTS; NEGATIVE FOR ATYPIA OR MALIGNANCY. - SEE TUMOR SYNOPTIC TEMPLATE BELOW. 2. Lymph nodes, regional resection, right pelvic - FOUR LYMPH  NODES, NEGATIVE FOR TUMOR (0/4). 3. Lymph nodes, regional resection, left pelvic - FOUR LYMPH NODES, NEGATIVE FOR TUMOR (0/4). Microscopic Comment 1. ONCOLOGY TABLE-UTERUS, CARCINOMA OR CARCINOSARCOMA Specimen: Uterus and bilateral fallopian tubes and ovaries. Procedure: Hysterectomy and bilateral salpingo-oophorectomy. Lymph node sampling performed: Yes. Specimen integrity: Intact. Maximum tumor size: 4.0 cm (tumor involved entire endometrium) Histologic type: Adenocarcinoma, endometrioid type. Grade: 1 Myometrial invasion: 0.5 cm where myometrium is 1.5 cm in thickness Cervical stromal involvement: Absent. Extent of involvement of other organs: None Lymph - vascular invasion: Absent. Peritoneal washings: N/A Lymph nodes: # examined - 8 ; # positive - 0 Pelvic lymph nodes: N/A involved of N/A lymph nodes. Para-aortic lymph nodes: N/A involved of N/A lymph nodes. Other (specify involvement and site): N/A TNM code: pT1a, pN0 FIGO Stage (based on  pathologic findings, needs clinical correlation): N/A Comments: None MSI testing abnormal   10/26/2014 Surgery   Surgery: Total robotic hysterectomy, bilateral salpingo-oophorectomy, bilateral pelvic lymph node dissection.   Surgeons:  Lucita Lora. Alycia Rossetti, MD; Lahoma Crocker, MD   Pathology: Uterus, cervix, bilateral tubes and ovaries, bilateral pelvic lymph nodes to pathology.   Operative findings: Omentum adherent to midline vertical incision. Fibroid uterus. Normal adnexa. Adhesions of anterior bladder to uterus. Frozen section with grade 1 cancer focally involving adenomyosis, minimal myometrial invasion.   11/17/2014 Imaging   Ct abdomen 1. Status post hysterectomy. Pelvic edema which is greater than typically seen 3 weeks postop. Suspicious for postoperative infection. 2. Bladder wall thickening and irregularity. Although this could be partially due to underdistention, Concurrent cystitis cannot be excluded. 3. Bilateral fluid density lesions along the pelvic sidewalls. Favored to represent seromas or lymphangiomas. No specific features to suggest abscess. If the patient's symptoms persist after appropriate antibiotic therapy, aspiration of the largest left pelvic sidewall "Lesion" should be considered. 4. Suspicion of mild hepatic steatosis. Indeterminate right hepatic lobe lesion. If definitive characterization is desired in this patient with history of primary malignancy, nonemergent pre and post contrast abdominal MRI could be performed. 5. Hiatal hernia.   11/25/2014 Imaging   1. Persistent bilateral pelvic sidewall fluid collections, left greater than right, with left-sided inflammatory changes. 2. Technically successful 12 French left pelvic abscess drain catheter placement. A sample of the aspirate was sent for Gram stain, culture and sensitivity.   12/08/2014 Imaging   CT abdomen and pelvis 1. Complete resolution of dominant fluid collection within the left hemipelvis  following percutaneous drainage catheter placement. This percutaneous drainage catheter was subsequent removed intact at the patient's bedside. 2. Continued decrease in size of bilobed fluid collections within the right hemipelvis with superficial component measuring 1.6 cm and posterior component measuring 2.6 cm, indeterminate though both favored to represent evolving seromas. 3. Unchanged indeterminate approximately 1.2 cm hepatic lesion. Further evaluation with contrast-enhanced abdominal MRI could be performed as clinically indicated. 4. Colonic diverticulosis without evidence of diverticulitis.   10/19/2016 Imaging   New 2.6 cm soft tissue mass involving right vaginal cuff, consistent with locally recurrent carcinoma.  No other sites of metastatic disease identified.  Colonic diverticulosis, without radiographic evidence of diverticulitis.  Mild hepatic steatosis.    10/24/2016 Relapse/Recurrence   Vaginal cuff recurrence. No evidence of distant disease.   10/24/2016 Pathology Results   Vagina, biopsy, right apex - ADENOCARCINOMA - SEE COMMENT Microscopic Comment The morphologic features are consistent with the patient's previously diagnosed endometrioid adenocarcinoma   11/08/2016 - 12/27/2016 Radiation Therapy   45 Gy in 25 fractions of 1.8 Gy. The residual pelvic mass  was boosted to 14 Gy in 7 fractions of 2 Gy. No intracavitary lesion seen at completion of pelvic fields to boost with brachytherapy.   06/12/2017 Imaging   CT abdomen and pelvis 1. New 1.1 by 0.8 cm right lower lobe pulmonary nodule, concerning for pulmonary metastatic disease. Possibilities for further assessment include biopsy or nuclear medicine PET-CT to assess this lesion and the rest of the neck/chest/abdomen/pelvis for other potential hypermetabolic lesions. 2. The previous soft tissue mass along the right vaginal cuff is no longer present. 3. Stable and likely benign right hepatic lobe hypodense  lesion. 4. Other imaging findings of potential clinical significance: Sigmoid colon diverticulosis. Aortic Atherosclerosis (ICD10-I70.0). Multilevel lumbar degenerative disc disease. Mild cardiomegaly. Small type 1 hiatal hernia.   07/05/2017 Imaging   Ct chest Several bilateral pulmonary nodules measuring up to 11 mm, highly suspicious for pulmonary metastases.   No evidence of lymphadenopathy or pleural effusion.  Aortic Atherosclerosis (ICD10-I70.0).   07/25/2017 Imaging   Status post CT-guided lung nodule biopsy. Tissue specimen sent to pathology for complete histopathologic analysis.   07/25/2017 Pathology Results   Lung, needle/core biopsy(ies), RLL - METASTATIC ADENOCARCINOMA - SEE COMMENT Microscopic Comment By immunohistochemistry, the neoplastic cells are positive for Pax-8 and ER but negative for TTF-1. Overall, this immunoprofile is consistent with metastasis from the patient's known endometrioid adenocarcinoma   07/30/2017 Cancer Staging   Staging form: Corpus Uteri - Carcinoma and Carcinosarcoma, AJCC 8th Edition - Clinical: Stage IVB (rcT1, cN0, pM1) - Signed by Heath Lark, MD on 07/30/2017   08/09/2017 - 11/01/2017 Chemotherapy   The patient had pembrolizumab for chemotherapy treatment.    10/10/2017 Imaging   1. Interval decrease in size and contour. The of previously described right and left lower lobe pulmonary nodules. 2. Interval increase in size of mediastinal and hilar adenopathy, potentially metastatic in etiology. 3. Aortic Atherosclerosis (ICD10-I70.0).   11/08/2017 - 11/10/2017 Hospital Admission   She was admitted to the hospital due to uncontrolled pain   11/29/2017 Imaging   Chest Impression:  No thoracic metastasis. No discrete pulmonary nodules identified.  Abdomen / Pelvis Impression:  No evidence of local recurrence or metastatic endometrial carcinoma in the abdomen pelvis.   02/21/2018 Imaging   1. No findings to suggest metastatic disease in the  chest, abdomen or pelvis. 2. Mild colonic diverticulosis without evidence of acute diverticulitis at this time. 3. Aortic atherosclerosis. 4. Additional incidental findings, as above.   03/10/2018 Imaging   Bone scan: No scintigraphic evidence of osseous metastatic disease.   05/22/2018 Imaging   1. Stable exam. No evidence of recurrent or metastatic carcinoma within the chest, abdomen, or pelvis. 2. Colonic diverticulosis, without radiographic evidence of diverticulitis.   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    Metastasis to lung ([place])  07/30/2017 Initial Diagnosis   Metastasis to lung ([place])   08/09/2017 - 11/21/2017 Chemotherapy   The patient had pembrolizumab (KEYTRUDA) 200 mg in sodium chloride 0.9 % 50 mL chemo infusion, 200 mg, Intravenous, Once, 5 of 7 cycles Administration: 200 mg (08/09/2017), 200 mg (08/30/2017), 200 mg (09/20/2017), 200 mg (10/11/2017), 200 mg (11/01/2017)  for chemotherapy treatment.  Family Hx:  Family History  Problem Relation Age of Onset  . Breast cancer Maternal Aunt        dx late 50's/60's, died in 51's  . Uterine cancer Mother 55  . Heart failure Mother   . Dementia Father   . Heart attack Paternal Grandfather 10  . Other Sister 68       had abnromal finding on mammogram, unsure if cancer. Was told to 'remove part of of body'    Vitals:  Blood pressure (!) 152/74, pulse 75, temperature 98 F (36.7 C), temperature source Oral, resp. rate 18, height 5' (1.524 m), weight 182 lb 14.4 oz (83 kg), SpO2 99 %.  Physical Exam: Well-nourished well-developed female in no  acute distress.  Neck: Supple, no lymphadenopathy, no thyromegaly.  Abdomen: Soft, nontender, nondistended.  Is a well-healed vertical midline incision.  No evidence of an incisional hernia.  Groins: No lymphadenopathy.  Extremities: 1+ nonpitting edema equal bilaterally.  Pelvic: External genitalia within normal limits.  The vagina is atrophic.  The vaginal cuff is visualized there are no visible lesions.  Bimanual examination feels no masses or nodularity.  Rectal confirms.  Assessment/Plan: 68 year old with a history of a stage Ia grade 1 endometrial cancer diagnosed in August 2016.  Unfortunately in August 2018 she had a vaginal cuff recurrence and was treated with external beam radiation.  In the spring 2019 she had lung metastases.  She was treated with pembrolizumab and based on most recent imaging in April has no evidence of recurrent disease.  She is scheduled for CT scan on August 14 with follow-up with Dr. Alvy Bimler on August 24.  She will see Dr. Sondra Come January 15, 2019 and will be followed by [name] in December.  We will follow-up on the results of her CT scan in August and see if Dr. Alvy Bimler would like a repeat CT scan prior to her visit with [name] in December.  Tamarick Kovalcik A., MD 10/21/2018, 9:31 AM

## 2018-10-21 ENCOUNTER — Inpatient Hospital Stay: Payer: 59 | Attending: Gynecologic Oncology | Admitting: Gynecologic Oncology

## 2018-10-21 ENCOUNTER — Other Ambulatory Visit: Payer: Self-pay

## 2018-10-21 VITALS — BP 152/74 | HR 75 | Temp 98.0°F | Resp 18 | Ht 60.0 in | Wt 182.9 lb

## 2018-10-21 DIAGNOSIS — C7800 Secondary malignant neoplasm of unspecified lung: Secondary | ICD-10-CM | POA: Diagnosis not present

## 2018-10-21 DIAGNOSIS — E039 Hypothyroidism, unspecified: Secondary | ICD-10-CM | POA: Insufficient documentation

## 2018-10-21 DIAGNOSIS — Z90710 Acquired absence of both cervix and uterus: Secondary | ICD-10-CM | POA: Diagnosis not present

## 2018-10-21 DIAGNOSIS — K219 Gastro-esophageal reflux disease without esophagitis: Secondary | ICD-10-CM | POA: Insufficient documentation

## 2018-10-21 DIAGNOSIS — Z87891 Personal history of nicotine dependence: Secondary | ICD-10-CM | POA: Insufficient documentation

## 2018-10-21 DIAGNOSIS — Z90722 Acquired absence of ovaries, bilateral: Secondary | ICD-10-CM

## 2018-10-21 DIAGNOSIS — C541 Malignant neoplasm of endometrium: Secondary | ICD-10-CM | POA: Insufficient documentation

## 2018-10-21 DIAGNOSIS — Z923 Personal history of irradiation: Secondary | ICD-10-CM | POA: Insufficient documentation

## 2018-10-21 NOTE — Patient Instructions (Signed)
Please make sure to return to see [name] in December.  When you see Dr. Sondra Come in October you may call up to schedule an appointment with [name] or you may ask the staff and radiation oncology to make that appointment.  Good luck with your CT scan in August.

## 2018-10-22 ENCOUNTER — Telehealth: Payer: Self-pay | Admitting: Hematology and Oncology

## 2018-10-22 NOTE — Telephone Encounter (Signed)
Scheduled appt per 7/29 sch message - left message for patient with appt date and time

## 2018-11-07 ENCOUNTER — Ambulatory Visit (HOSPITAL_COMMUNITY): Admission: RE | Admit: 2018-11-07 | Payer: Medicare Other | Source: Ambulatory Visit

## 2018-11-07 ENCOUNTER — Other Ambulatory Visit: Payer: 59

## 2018-11-10 ENCOUNTER — Other Ambulatory Visit: Payer: 59

## 2018-11-13 ENCOUNTER — Inpatient Hospital Stay: Payer: 59 | Attending: Gynecologic Oncology

## 2018-11-13 ENCOUNTER — Ambulatory Visit (HOSPITAL_COMMUNITY)
Admission: RE | Admit: 2018-11-13 | Discharge: 2018-11-13 | Disposition: A | Discharge status: Home / Self Care | Payer: No Typology Code available for payment source | Source: Ambulatory Visit | Attending: Hematology and Oncology | Admitting: Hematology and Oncology

## 2018-11-13 ENCOUNTER — Other Ambulatory Visit: Payer: Self-pay

## 2018-11-13 DIAGNOSIS — M2550 Pain in unspecified joint: Secondary | ICD-10-CM | POA: Insufficient documentation

## 2018-11-13 DIAGNOSIS — C7800 Secondary malignant neoplasm of unspecified lung: Secondary | ICD-10-CM | POA: Insufficient documentation

## 2018-11-13 DIAGNOSIS — C541 Malignant neoplasm of endometrium: Secondary | ICD-10-CM | POA: Diagnosis not present

## 2018-11-13 DIAGNOSIS — Z923 Personal history of irradiation: Secondary | ICD-10-CM | POA: Insufficient documentation

## 2018-11-13 DIAGNOSIS — Z87891 Personal history of nicotine dependence: Secondary | ICD-10-CM | POA: Insufficient documentation

## 2018-11-13 LAB — CBC WITH DIFFERENTIAL/PLATELET
Abs Immature Granulocytes: 0.01 10*3/uL (ref 0.00–0.07)
Basophils Absolute: 0.1 10*3/uL (ref 0.0–0.1)
Basophils Relative: 2 %
Eosinophils Absolute: 0.3 10*3/uL (ref 0.0–0.5)
Eosinophils Relative: 6 %
HCT: 39.7 % (ref 36.0–46.0)
Hemoglobin: 13.1 g/dL (ref 12.0–15.0)
Immature Granulocytes: 0 %
Lymphocytes Relative: 17 %
Lymphs Abs: 0.7 10*3/uL (ref 0.7–4.0)
MCH: 30.3 pg (ref 26.0–34.0)
MCHC: 33 g/dL (ref 30.0–36.0)
MCV: 91.9 fL (ref 80.0–100.0)
Monocytes Absolute: 0.5 10*3/uL (ref 0.1–1.0)
Monocytes Relative: 11 %
Neutro Abs: 2.8 10*3/uL (ref 1.7–7.7)
Neutrophils Relative %: 64 %
Platelets: 227 10*3/uL (ref 150–400)
RBC: 4.32 MIL/uL (ref 3.87–5.11)
RDW: 14.2 % (ref 11.5–15.5)
WBC: 4.4 10*3/uL (ref 4.0–10.5)
nRBC: 0 % (ref 0.0–0.2)

## 2018-11-13 LAB — COMPREHENSIVE METABOLIC PANEL
ALT: 28 U/L (ref 0–44)
AST: 18 U/L (ref 15–41)
Albumin: 3.6 g/dL (ref 3.5–5.0)
Alkaline Phosphatase: 63 U/L (ref 38–126)
Anion gap: 9 (ref 5–15)
BUN: 15 mg/dL (ref 8–23)
CO2: 25 mmol/L (ref 22–32)
Calcium: 8.9 mg/dL (ref 8.9–10.3)
Chloride: 106 mmol/L (ref 98–111)
Creatinine, Ser: 0.79 mg/dL (ref 0.44–1.00)
GFR calc Af Amer: >60 mL/min (ref >60)
GFR calc non Af Amer: >60 mL/min (ref >60)
Glucose, Bld: 93 mg/dL (ref 70–99)
Potassium: 4.1 mmol/L (ref 3.5–5.1)
Sodium: 140 mmol/L (ref 135–145)
Total Bilirubin: 0.7 mg/dL (ref 0.3–1.2)
Total Protein: 6.6 g/dL (ref 6.5–8.1)

## 2018-11-13 MED ORDER — IOHEXOL 300 MG/ML  SOLN
25.0000 mL | Freq: Once | INTRAMUSCULAR | Status: AC | PRN
  Administered 2018-11-13: 30 mL via ORAL

## 2018-11-13 MED ORDER — SODIUM CHLORIDE (PF) 0.9 % IJ SOLN
INTRAMUSCULAR | Status: AC
  Filled 2018-11-13: qty 50

## 2018-11-13 MED ORDER — IOHEXOL 300 MG/ML  SOLN
100.0000 mL | Freq: Once | INTRAMUSCULAR | Status: AC | PRN
  Administered 2018-11-13: 100 mL via INTRAVENOUS

## 2018-11-17 ENCOUNTER — Inpatient Hospital Stay (HOSPITAL_BASED_OUTPATIENT_CLINIC_OR_DEPARTMENT_OTHER): Payer: 59 | Admitting: Hematology and Oncology

## 2018-11-17 ENCOUNTER — Other Ambulatory Visit: Payer: Self-pay

## 2018-11-17 DIAGNOSIS — C541 Malignant neoplasm of endometrium: Secondary | ICD-10-CM | POA: Diagnosis not present

## 2018-11-17 DIAGNOSIS — M1990 Unspecified osteoarthritis, unspecified site: Secondary | ICD-10-CM | POA: Diagnosis not present

## 2018-11-17 DIAGNOSIS — Z923 Personal history of irradiation: Secondary | ICD-10-CM | POA: Diagnosis not present

## 2018-11-17 DIAGNOSIS — Z87891 Personal history of nicotine dependence: Secondary | ICD-10-CM | POA: Diagnosis not present

## 2018-11-17 DIAGNOSIS — M2550 Pain in unspecified joint: Secondary | ICD-10-CM | POA: Diagnosis not present

## 2018-11-17 DIAGNOSIS — C7800 Secondary malignant neoplasm of unspecified lung: Secondary | ICD-10-CM | POA: Diagnosis not present

## 2018-11-18 ENCOUNTER — Encounter: Payer: Self-pay | Admitting: Hematology and Oncology

## 2018-11-18 ENCOUNTER — Telehealth: Payer: Self-pay | Admitting: Hematology and Oncology

## 2018-11-18 NOTE — Progress Notes (Signed)
[place] OFFICE PROGRESS NOTE  Patient Care Team: [name], MD as PCP - General (Internal Medicine) Jacelyn Pi, MD as Consulting Physician (Endocrinology) Heath Lark, MD as Consulting Physician (Hematology and Oncology)  ASSESSMENT & PLAN:  Endometrial cancer [facility]) I have reviewed her blood work and imaging studies She has no signs of cancer recurrence However, she is still at high risk of relapse She has not been treated for a long time I recommend repeat imaging study in 6 months She agreed with the plan of care The patient is educated to watch for signs and symptoms of cancer recurrence  Inflammatory arthritis She continues to have intermittent arthritis joint pain She was referred to see orthopedic surgery by primary care doctor for management   Orders Placed This Encounter  Procedures  . CT CHEST W CONTRAST    Standing Status:   Future    Standing Expiration Date:   11/18/2019    Order Specific Question:   If indicated for the ordered procedure, I authorize the administration of contrast media per Radiology protocol    Answer:   Yes    Order Specific Question:   Preferred imaging location?    Answer:   [facility]    Order Specific Question:   Radiology Contrast Protocol - do NOT remove file path    Answer:   \\[email or web address]  . CT ABDOMEN PELVIS W CONTRAST    Standing Status:   Future    Standing Expiration Date:   11/18/2019    Order Specific Question:   If indicated for the ordered procedure, I authorize the administration of contrast media per Radiology protocol    Answer:   Yes    Order Specific Question:   Preferred imaging location?    Answer:   [facility]    Order Specific Question:   Radiology Contrast Protocol - do NOT remove file path    Answer:   \\[email or web address]  . Comprehensive metabolic panel    Standing Status:   Future    Standing Expiration Date:    12/23/2019  . CBC with Differential/Platelet    Standing Status:   Future    Standing Expiration Date:   12/23/2019    INTERVAL HISTORY: Please see below for problem oriented charting. She returns for further follow-up She has intermittent inflammatory arthritis joint pain No recent infection, fever or chills Denies abnormal vaginal bleeding No cough, chest pain or shortness of breath  SUMMARY OF ONCOLOGIC HISTORY: Oncology History Overview Note  Abnormal MSI, germline mutation was negative Endometrioid   Endometrial cancer ([place])  09/15/2014 Pathology Results   Endometrium, curettage - ENDOMETRIAL ADENOCARCINOMA. - SEE COMMENT. Microscopic Comment Although definitive characterization is best performed on resection specimen, as sampled, the endometrial adenocarcinoma appears to be endometrioid subtype, FIGO grade 1.    09/15/2014 Surgery   PreOp: postmenopausal bleeding, cervical stenosis PostOp: same and uterine polyp Procedure:  Hysteroscopy, Dilation and Curettage, Myosure polypectomy Surgeon: Dr. Janyth Pupa  Findings:8cm uterus with thickened polypoid-like endometrium, large 2cm irregular appearing polyp  Specimens: 1) endometrial curettings   10/26/2014 Pathology Results   1. Uterus +/- tubes/ovaries, neoplastic - INVASIVE ADENOCARCINOMA, ENDOMETRIOID TYPE, SEE COMMENT. - TUMOR INVOLVES LESS THAN ONE HALF MYOMETRIAL THICKNESS. - TUMOR INVOLVES UTERINE ADENOMYOSIS. - BENIGN LEIOMYOMATA (UP TO 2.5 CM). - BENIGN CERVICAL MUCOSA; NEGATIVE FOR INTRAEPITHELIAL LESION OR MALIGNANCY. - BENIGN RIGHT AND LEFT OVARIES; NEGATIVE FOR ATYPIA OR MALIGNANCY. - BENIGN RIGHT AND LEFT FALLOPIAN TUBE; NEGATIVE FOR ATYPIA OR  MALIGNANCY. - BENIGN PARATUBAL CYSTS; NEGATIVE FOR ATYPIA OR MALIGNANCY. - SEE TUMOR SYNOPTIC TEMPLATE BELOW. 2. Lymph nodes, regional resection, right pelvic - FOUR LYMPH NODES, NEGATIVE FOR TUMOR (0/4). 3. Lymph nodes, regional resection, left pelvic - FOUR  LYMPH NODES, NEGATIVE FOR TUMOR (0/4). Microscopic Comment 1. ONCOLOGY TABLE-UTERUS, CARCINOMA OR CARCINOSARCOMA Specimen: Uterus and bilateral fallopian tubes and ovaries. Procedure: Hysterectomy and bilateral salpingo-oophorectomy. Lymph node sampling performed: Yes. Specimen integrity: Intact. Maximum tumor size: 4.0 cm (tumor involved entire endometrium) Histologic type: Adenocarcinoma, endometrioid type. Grade: 1 Myometrial invasion: 0.5 cm where myometrium is 1.5 cm in thickness Cervical stromal involvement: Absent. Extent of involvement of other organs: None Lymph - vascular invasion: Absent. Peritoneal washings: N/A Lymph nodes: # examined - 8 ; # positive - 0 Pelvic lymph nodes: N/A involved of N/A lymph nodes. Para-aortic lymph nodes: N/A involved of N/A lymph nodes. Other (specify involvement and site): N/A TNM code: pT1a, pN0 FIGO Stage (based on pathologic findings, needs clinical correlation): N/A Comments: None MSI testing abnormal   10/26/2014 Surgery   Surgery: Total robotic hysterectomy, bilateral salpingo-oophorectomy, bilateral pelvic lymph node dissection.   Surgeons:  Lucita Lora. Alycia Rossetti, MD; Lahoma Crocker, MD   Pathology: Uterus, cervix, bilateral tubes and ovaries, bilateral pelvic lymph nodes to pathology.   Operative findings: Omentum adherent to midline vertical incision. Fibroid uterus. Normal adnexa. Adhesions of anterior bladder to uterus. Frozen section with grade 1 cancer focally involving adenomyosis, minimal myometrial invasion.   11/17/2014 Imaging   Ct abdomen 1. Status post hysterectomy. Pelvic edema which is greater than typically seen 3 weeks postop. Suspicious for postoperative infection. 2. Bladder wall thickening and irregularity. Although this could be partially due to underdistention, Concurrent cystitis cannot be excluded. 3. Bilateral fluid density lesions along the pelvic sidewalls. Favored to represent seromas or lymphangiomas.  No specific features to suggest abscess. If the patient's symptoms persist after appropriate antibiotic therapy, aspiration of the largest left pelvic sidewall "Lesion" should be considered. 4. Suspicion of mild hepatic steatosis. Indeterminate right hepatic lobe lesion. If definitive characterization is desired in this patient with history of primary malignancy, nonemergent pre and post contrast abdominal MRI could be performed. 5. Hiatal hernia.   11/25/2014 Imaging   1. Persistent bilateral pelvic sidewall fluid collections, left greater than right, with left-sided inflammatory changes. 2. Technically successful 12 French left pelvic abscess drain catheter placement. A sample of the aspirate was sent for Gram stain, culture and sensitivity.   12/08/2014 Imaging   CT abdomen and pelvis 1. Complete resolution of dominant fluid collection within the left hemipelvis following percutaneous drainage catheter placement. This percutaneous drainage catheter was subsequent removed intact at the patient's bedside. 2. Continued decrease in size of bilobed fluid collections within the right hemipelvis with superficial component measuring 1.6 cm and posterior component measuring 2.6 cm, indeterminate though both favored to represent evolving seromas. 3. Unchanged indeterminate approximately 1.2 cm hepatic lesion. Further evaluation with contrast-enhanced abdominal MRI could be performed as clinically indicated. 4. Colonic diverticulosis without evidence of diverticulitis.   10/19/2016 Imaging   New 2.6 cm soft tissue mass involving right vaginal cuff, consistent with locally recurrent carcinoma.  No other sites of metastatic disease identified.  Colonic diverticulosis, without radiographic evidence of diverticulitis.  Mild hepatic steatosis.    10/24/2016 Relapse/Recurrence   Vaginal cuff recurrence. No evidence of distant disease.   10/24/2016 Pathology Results   Vagina, biopsy, right apex -  ADENOCARCINOMA - SEE COMMENT Microscopic Comment The morphologic features are consistent with  the patient's previously diagnosed endometrioid adenocarcinoma   11/08/2016 - 12/27/2016 Radiation Therapy   45 Gy in 25 fractions of 1.8 Gy. The residual pelvic mass was boosted to 14 Gy in 7 fractions of 2 Gy. No intracavitary lesion seen at completion of pelvic fields to boost with brachytherapy.   06/12/2017 Imaging   CT abdomen and pelvis 1. New 1.1 by 0.8 cm right lower lobe pulmonary nodule, concerning for pulmonary metastatic disease. Possibilities for further assessment include biopsy or nuclear medicine PET-CT to assess this lesion and the rest of the neck/chest/abdomen/pelvis for other potential hypermetabolic lesions. 2. The previous soft tissue mass along the right vaginal cuff is no longer present. 3. Stable and likely benign right hepatic lobe hypodense lesion. 4. Other imaging findings of potential clinical significance: Sigmoid colon diverticulosis. Aortic Atherosclerosis (ICD10-I70.0). Multilevel lumbar degenerative disc disease. Mild cardiomegaly. Small type 1 hiatal hernia.   07/05/2017 Imaging   Ct chest Several bilateral pulmonary nodules measuring up to 11 mm, highly suspicious for pulmonary metastases.   No evidence of lymphadenopathy or pleural effusion.  Aortic Atherosclerosis (ICD10-I70.0).   07/25/2017 Imaging   Status post CT-guided lung nodule biopsy. Tissue specimen sent to pathology for complete histopathologic analysis.   07/25/2017 Pathology Results   Lung, needle/core biopsy(ies), RLL - METASTATIC ADENOCARCINOMA - SEE COMMENT Microscopic Comment By immunohistochemistry, the neoplastic cells are positive for Pax-8 and ER but negative for TTF-1. Overall, this immunoprofile is consistent with metastasis from the patient's known endometrioid adenocarcinoma   07/30/2017 Cancer Staging   Staging form: Corpus Uteri - Carcinoma and Carcinosarcoma, AJCC 8th Edition -  Clinical: Stage IVB (rcT1, cN0, pM1) - Signed by Heath Lark, MD on 07/30/2017   08/09/2017 - 11/21/2017 Chemotherapy   The patient had pembrolizumab for chemotherapy treatment.    10/10/2017 Imaging   1. Interval decrease in size and contour. The of previously described right and left lower lobe pulmonary nodules. 2. Interval increase in size of mediastinal and hilar adenopathy, potentially metastatic in etiology. 3. Aortic Atherosclerosis (ICD10-I70.0).   11/08/2017 - 11/10/2017 Hospital Admission   She was admitted to the hospital due to uncontrolled pain   11/29/2017 Imaging   Chest Impression:  No thoracic metastasis. No discrete pulmonary nodules identified.  Abdomen / Pelvis Impression:  No evidence of local recurrence or metastatic endometrial carcinoma in the abdomen pelvis.   02/21/2018 Imaging   1. No findings to suggest metastatic disease in the chest, abdomen or pelvis. 2. Mild colonic diverticulosis without evidence of acute diverticulitis at this time. 3. Aortic atherosclerosis. 4. Additional incidental findings, as above.   03/10/2018 Imaging   Bone scan: No scintigraphic evidence of osseous metastatic disease.   05/22/2018 Imaging   1. Stable exam. No evidence of recurrent or metastatic carcinoma within the chest, abdomen, or pelvis. 2. Colonic diverticulosis, without radiographic evidence of diverticulitis.   11/13/2018 Imaging   1. No evidence of recurrent or metastatic carcinoma within the chest, abdomen, or pelvis. 2. Colonic diverticulosis, without radiographic evidence of diverticulitis. 3. Stable hepatic steatosis.   Aortic Atherosclerosis (ICD10-I70.0).   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    Metastasis to lung (  [place])  07/30/2017 Initial Diagnosis   Metastasis to lung ([place])   08/09/2017 - 11/21/2017 Chemotherapy   The patient had pembrolizumab (KEYTRUDA) 200 mg in sodium chloride 0.9 % 50 mL chemo infusion, 200 mg, Intravenous, Once, 5 of 7 cycles Administration: 200 mg (08/09/2017), 200 mg (08/30/2017), 200 mg (09/20/2017), 200 mg (10/11/2017), 200 mg (11/01/2017)  for chemotherapy treatment.      REVIEW OF SYSTEMS:   Constitutional: Denies fevers, chills or abnormal weight loss Eyes: Denies blurriness of vision Ears, nose, mouth, throat, and face: Denies mucositis or sore throat Respiratory: Denies cough, dyspnea or wheezes Cardiovascular: Denies palpitation, chest discomfort or lower extremity swelling Gastrointestinal:  Denies nausea, heartburn or change in bowel habits Skin: Denies abnormal skin rashes Lymphatics: Denies new lymphadenopathy or easy bruising Neurological:Denies numbness, tingling or new weaknesses Behavioral/Psych: Mood is stable, no new changes  All other systems were reviewed with the patient and are negative.  I have reviewed the past medical history, past surgical history, social history and family history with the patient and they are unchanged from previous note.  ALLERGIES:  is allergic to hydrocodone.  MEDICATIONS:  Current Outpatient Medications  Medication Sig Dispense Refill  . cetirizine (ZYRTEC) 10 MG tablet Take 10 mg by mouth daily.    . Cholecalciferol (VITAMIN D) 50 MCG (2000 UT) CAPS Take by mouth.    Marland Kitchen FLUoxetine (PROZAC) 40 MG capsule Take 40 mg by mouth daily.    Marland Kitchen SYNTHROID 100 MCG tablet 1 TABLET IN THE MORNING ON AN EMPTY STOMACH ONCE A DAY ORALLY 90 DAYS     No current facility-administered medications for this visit.     PHYSICAL EXAMINATION: ECOG PERFORMANCE STATUS: 0 - Asymptomatic  Vitals:   11/17/18  0850  BP: (!) 129/57  Pulse: 70  Resp: 18  Temp: 98.2 F (36.8 C)  SpO2: 97%   Filed Weights   11/17/18 0850  Weight: 183 lb 3.2 oz (83.1 kg)    GENERAL:alert, no distress and comfortable SKIN: skin color, texture, turgor are normal, no rashes or significant lesions EYES: normal, Conjunctiva are pink and non-injected, sclera clear OROPHARYNX:no exudate, no erythema and lips, buccal mucosa, and tongue normal  NECK: supple, thyroid normal size, non-tender, without nodularity LYMPH:  no palpable lymphadenopathy in the cervical, axillary or inguinal LUNGS: clear to auscultation and percussion with normal breathing effort HEART: regular rate & rhythm and no murmurs and no lower extremity edema ABDOMEN:abdomen soft, non-tender and normal bowel sounds Musculoskeletal:no cyanosis of digits and no clubbing  NEURO: alert & oriented x 3 with fluent speech, no focal motor/sensory deficits  LABORATORY DATA:  I have reviewed the data as listed    Component Value Date/Time   NA 140 11/13/2018 0906   K 4.1 11/13/2018 0906   CL 106 11/13/2018 0906   CO2 25 11/13/2018 0906   GLUCOSE 93 11/13/2018 0906   BUN 15 11/13/2018 0906   BUN 18.7 10/17/2016 1353   CREATININE 0.79 11/13/2018 0906   CREATININE 0.83 07/14/2018 1211   CREATININE 0.8 10/17/2016 1353   CALCIUM 8.9 11/13/2018 0906   PROT 6.6 11/13/2018 0906   ALBUMIN 3.6 11/13/2018 0906   AST 18 11/13/2018 0906   AST 16 07/14/2018 1211   ALT 28 11/13/2018 0906   ALT 24 07/14/2018 1211   ALKPHOS 63 11/13/2018 0906   BILITOT 0.7 11/13/2018 0906   BILITOT 0.7 07/14/2018 1211   GFRNONAA >60 11/13/2018 0906   GFRNONAA >60 07/14/2018 1211   GFRAA >60 11/13/2018 4098  GFRAA >60 07/14/2018 1211    No results found for: SPEP, UPEP  Lab Results  Component Value Date   WBC 4.4 11/13/2018   NEUTROABS 2.8 11/13/2018   HGB 13.1 11/13/2018   HCT 39.7 11/13/2018   MCV 91.9 11/13/2018   PLT 227 11/13/2018      Chemistry       Component Value Date/Time   NA 140 11/13/2018 0906   K 4.1 11/13/2018 0906   CL 106 11/13/2018 0906   CO2 25 11/13/2018 0906   BUN 15 11/13/2018 0906   BUN 18.7 10/17/2016 1353   CREATININE 0.79 11/13/2018 0906   CREATININE 0.83 07/14/2018 1211   CREATININE 0.8 10/17/2016 1353      Component Value Date/Time   CALCIUM 8.9 11/13/2018 0906   ALKPHOS 63 11/13/2018 0906   AST 18 11/13/2018 0906   AST 16 07/14/2018 1211   ALT 28 11/13/2018 0906   ALT 24 07/14/2018 1211   BILITOT 0.7 11/13/2018 0906   BILITOT 0.7 07/14/2018 1211       RADIOGRAPHIC STUDIES: I have reviewed multiple imaging studies with the patient I have personally reviewed the radiological images as listed and agreed with the findings in the report. Ct Chest W Contrast  Result Date: 11/13/2018 CLINICAL DATA:  Followup endometrial carcinoma. EXAM: CT CHEST, ABDOMEN, AND PELVIS WITH CONTRAST TECHNIQUE: Multidetector CT imaging of the chest, abdomen and pelvis was performed following the standard protocol during bolus administration of intravenous contrast. CONTRAST:  172m OMNIPAQUE IOHEXOL 300 MG/ML SOLN, 337mOMNIPAQUE IOHEXOL 300 MG/ML SOLN COMPARISON:  07/21/2018 FINDINGS: CT CHEST FINDINGS Cardiovascular: No acute findings. Aortic atherosclerosis. Mediastinum/Lymph Nodes: No masses or pathologically enlarged lymph nodes identified. Lungs/Pleura: Stable mild right lung pleural-parenchymal scarring. No suspicious pulmonary nodules or masses identified. No evidence of pulmonary infiltrate or pleural effusion. Musculoskeletal:  No suspicious bone lesions identified. CT ABDOMEN AND PELVIS FINDINGS Hepatobiliary: No masses identified. Stable small right hepatic lobe cyst. Stable hepatic steatosis. Gallbladder is unremarkable. No evidence of biliary ductal dilatation. Pancreas:  No mass or inflammatory changes. Spleen:  Within normal limits in size and appearance. Adrenals/Urinary tract: Stable tiny sub-cm right renal cyst. No  masses or hydronephrosis. Stomach/Bowel: No evidence of obstruction, inflammatory process, or abnormal fluid collections. Normal appendix visualized. Diverticulosis is seen mainly involving the sigmoid colon, however there is no evidence of diverticulitis. Vascular/Lymphatic: No pathologically enlarged lymph nodes identified. No abdominal aortic aneurysm. Aortic atherosclerosis. Reproductive: Prior hysterectomy noted. Adnexal regions are unremarkable in appearance. Other:  None. Musculoskeletal:  No suspicious bone lesions identified. IMPRESSION: 1. No evidence of recurrent or metastatic carcinoma within the chest, abdomen, or pelvis. 2. Colonic diverticulosis, without radiographic evidence of diverticulitis. 3. Stable hepatic steatosis. Aortic Atherosclerosis (ICD10-I70.0). Electronically Signed   By: JoMarlaine Hind.D.   On: 11/13/2018 12:59   Ct Abdomen Pelvis W Contrast  Result Date: 11/13/2018 CLINICAL DATA:  Followup endometrial carcinoma. EXAM: CT CHEST, ABDOMEN, AND PELVIS WITH CONTRAST TECHNIQUE: Multidetector CT imaging of the chest, abdomen and pelvis was performed following the standard protocol during bolus administration of intravenous contrast. CONTRAST:  10033mMNIPAQUE IOHEXOL 300 MG/ML SOLN, 12m47mNIPAQUE IOHEXOL 300 MG/ML SOLN COMPARISON:  07/21/2018 FINDINGS: CT CHEST FINDINGS Cardiovascular: No acute findings. Aortic atherosclerosis. Mediastinum/Lymph Nodes: No masses or pathologically enlarged lymph nodes identified. Lungs/Pleura: Stable mild right lung pleural-parenchymal scarring. No suspicious pulmonary nodules or masses identified. No evidence of pulmonary infiltrate or pleural effusion. Musculoskeletal:  No suspicious bone lesions identified. CT ABDOMEN AND PELVIS FINDINGS Hepatobiliary:  No masses identified. Stable small right hepatic lobe cyst. Stable hepatic steatosis. Gallbladder is unremarkable. No evidence of biliary ductal dilatation. Pancreas:  No mass or inflammatory changes.  Spleen:  Within normal limits in size and appearance. Adrenals/Urinary tract: Stable tiny sub-cm right renal cyst. No masses or hydronephrosis. Stomach/Bowel: No evidence of obstruction, inflammatory process, or abnormal fluid collections. Normal appendix visualized. Diverticulosis is seen mainly involving the sigmoid colon, however there is no evidence of diverticulitis. Vascular/Lymphatic: No pathologically enlarged lymph nodes identified. No abdominal aortic aneurysm. Aortic atherosclerosis. Reproductive: Prior hysterectomy noted. Adnexal regions are unremarkable in appearance. Other:  None. Musculoskeletal:  No suspicious bone lesions identified. IMPRESSION: 1. No evidence of recurrent or metastatic carcinoma within the chest, abdomen, or pelvis. 2. Colonic diverticulosis, without radiographic evidence of diverticulitis. 3. Stable hepatic steatosis. Aortic Atherosclerosis (ICD10-I70.0). Electronically Signed   By: Marlaine Hind M.D.   On: 11/13/2018 12:59    All questions were answered. The patient knows to call the clinic with any problems, questions or concerns. No barriers to learning was detected.  I spent 15 minutes counseling the patient face to face. The total time spent in the appointment was 20 minutes and more than 50% was on counseling and review of test results  Heath Lark, MD 11/18/2018 8:04 AM

## 2018-11-18 NOTE — Assessment & Plan Note (Signed)
She continues to have intermittent arthritis joint pain She was referred to see orthopedic surgery by primary care doctor for management

## 2018-11-18 NOTE — Telephone Encounter (Signed)
I talk with patient regarding schedule  

## 2018-11-18 NOTE — Assessment & Plan Note (Signed)
I have reviewed her blood work and imaging studies She has no signs of cancer recurrence However, she is still at high risk of relapse She has not been treated for a long time I recommend repeat imaging study in 6 months She agreed with the plan of care The patient is educated to watch for signs and symptoms of cancer recurrence

## 2018-11-19 ENCOUNTER — Other Ambulatory Visit: Payer: 59

## 2018-11-20 ENCOUNTER — Ambulatory Visit: Payer: 59 | Admitting: Hematology and Oncology

## 2019-01-15 ENCOUNTER — Ambulatory Visit
Admission: RE | Admit: 2019-01-15 | Discharge: 2019-01-15 | Disposition: A | Discharge status: Home / Self Care | Payer: 59 | Source: Ambulatory Visit | Attending: Radiation Oncology | Admitting: Radiation Oncology

## 2019-01-15 ENCOUNTER — Other Ambulatory Visit: Payer: Self-pay

## 2019-01-15 ENCOUNTER — Encounter: Payer: Self-pay | Admitting: Radiation Oncology

## 2019-01-15 VITALS — BP 140/75 | HR 82 | Temp 98.0°F | Resp 18 | Ht 60.0 in | Wt 181.6 lb

## 2019-01-15 DIAGNOSIS — M25562 Pain in left knee: Secondary | ICD-10-CM | POA: Diagnosis not present

## 2019-01-15 DIAGNOSIS — C541 Malignant neoplasm of endometrium: Secondary | ICD-10-CM

## 2019-01-15 DIAGNOSIS — Z79899 Other long term (current) drug therapy: Secondary | ICD-10-CM | POA: Insufficient documentation

## 2019-01-15 DIAGNOSIS — Z8542 Personal history of malignant neoplasm of other parts of uterus: Secondary | ICD-10-CM | POA: Diagnosis not present

## 2019-01-15 NOTE — Progress Notes (Signed)
Pt presents today for f/u with Dr. Sondra Come. Pt denies c/o pain. Pt denies dysuria/hematuria. Pt denies vaginal bleeding/discharge. Pt denies rectal bleeding, diarrhea/constipation. Pt denies abdominal bloating, N/V.   BP 140/75 (Patient Position: Sitting)   Pulse 82   Temp 98 F (36.7 C) (Temporal)   Resp 18   Ht 5' (1.524 m)   Wt 181 lb 9.6 oz (82.4 kg)   SpO2 98%   BMI 35.47 kg/m   Wt Readings from Last 3 Encounters:  01/15/19 181 lb 9.6 oz (82.4 kg)  11/17/18 183 lb 3.2 oz (83.1 kg)  10/21/18 182 lb 14.4 oz (83 kg)   Loma Sousa, RN BSN

## 2019-01-15 NOTE — Progress Notes (Signed)
Radiation Oncology         [phone] ________________________________  Name: Ashley Villegas MRN: [id number]  Date: 01/15/2019  DOB: 04/26/1950  Follow-Up Visit Note  CC: [name], MD  [name], MD    ICD-10-CM   1. Endometrial cancer ([place])  C54.1     Diagnosis:  68 y.o.woman with recurrent endometrial cancer, original stage IA grade 1, s/p hysterectomy.     Interval Since Last Radiation:  2 years, 2 weeks  11/08/2016 - 12/27/2016: 1. The pelvis was treated to 45 Gy in 25 fractions of 1.8 Gy. 2. The residual pelvic mass was boosted to 14 Gy in 7 fractions of 2 Gy. (Most of recurrence was outside vaginal vault so therefore brachii therapy was not used for a boost)   Narrative:  The patient returns today for routine follow-up. She last saw Dr. Alycia Rossetti on 10/21/2018, with no evidence of recurrence on clinical exam.  Since her last visit, she has undergone follow up studies. Most recently, she underwent chest/abdomen/pelvis CT on 11/13/2018. These showed no evidence of recurrent or metastatic carcinoma.  She last saw Dr. Alvy Bimler on 11/17/2018, and they discussed these scans. Dr. Alvy Bimler recommends repeat imaging study in 04/2019.  On review of systems, she continues have left knee pain.  Per discussion with the patient she is not a good candidate for knee replacement.  She recently had an injection which has not helped with her pain.  Neck step with the injection of a gel material within the joint.  Denies any pelvic pain abdominal bloating vaginal bleeding or rectal bleeding.  She denies any hematuria.  sHe denies any problems with diarrhea.  ALLERGIES:  is allergic to hydrocodone.  Meds: Current Outpatient Medications  Medication Sig Dispense Refill  . Black Pepper-Turmeric (TURMERIC COMPLEX/BLACK PEPPER PO) Take by mouth.    . cetirizine (ZYRTEC) 10 MG tablet Take 10 mg by mouth daily.    . Cholecalciferol (VITAMIN D) 50 MCG (2000 UT) CAPS Take by mouth.    Marland Kitchen  FLUoxetine (PROZAC) 40 MG capsule Take 40 mg by mouth daily.    . Garlic 123XX123 MG CAPS Take by mouth.    . SYNTHROID 100 MCG tablet 1 TABLET IN THE MORNING ON AN EMPTY STOMACH ONCE A DAY ORALLY 90 DAYS     No current facility-administered medications for this encounter.     Physical Findings: The patient is in no acute distress. Patient is alert and oriented.  height is 5' (1.524 m) and weight is 181 lb 9.6 oz (82.4 kg). Her temporal temperature is 98 F (36.7 C). Her blood pressure is 140/75 and her pulse is 82. Her respiration is 18 and oxygen saturation is 98%. . Lungs are clear to auscultation bilaterally. Heart has regular rate and rhythm. No palpable cervical, supraclavicular, or axillary adenopathy. Abdomen soft, non-tender, normal bowel sounds.  On pelvic examination the external genitalia were unremarkable. A speculum exam was performed. There are no mucosal lesions noted in the vaginal vault. On bimanual and rectovaginal examination there were no pelvic masses appreciated.  She seemed to tolerate the pelvic exam much better compared to previous exams.   Lab Findings: Lab Results  Component Value Date   WBC 4.4 11/13/2018   HGB 13.1 11/13/2018   HCT 39.7 11/13/2018   MCV 91.9 11/13/2018   PLT 227 11/13/2018    Radiographic Findings: No results found.  Impression:  Recurrent Endometrial Cancer  No evidence of recurrence on clinical exam today and recent scans of  the chest abdomen and pelvis.  Plan:  Routine follow up in June 2021.  the patient will be seen by gynecologic oncology in 3 months.  Since she is out over 2 years since her treatment,  I will stretch her next appointment out to  6 months interval.   ____________________________________   Blair Promise, PhD, MD   This document serves as a record of services personally performed by Gery Pray, MD. It was created on his behalf by Wilburn Mylar, a trained medical scribe. The creation of this record is based  on the scribe's personal observations and the provider's statements to them. This document has been checked and approved by the attending provider.

## 2019-01-15 NOTE — Patient Instructions (Signed)
Coronavirus (COVID-19) Are you at risk?  Are you at risk for the Coronavirus (COVID-19)?  To be considered HIGH RISK for Coronavirus (COVID-19), you have to meet the following criteria:  . Traveled to [place], [place], [name], [place] or [place]; or in the [place] to [place], [place], [place], or [place]; and have fever, cough, and shortness of breath within the last 2 weeks of travel OR . Been in close contact with a person diagnosed with COVID-19 within the last 2 weeks and have fever, cough, and shortness of breath . IF YOU DO NOT MEET THESE CRITERIA, YOU ARE CONSIDERED LOW RISK FOR COVID-19.  What to do if you are HIGH RISK for COVID-19?  . If you are having a medical emergency, call 911. . Seek medical care right away. Before you go to a doctor's office, urgent care or emergency department, call ahead and tell them about your recent travel, contact with someone diagnosed with COVID-19, and your symptoms. You should receive instructions from your physician's office regarding next steps of care.  . When you arrive at healthcare provider, tell the healthcare staff immediately you have returned from visiting [place], [place], [place], [place] or [name]; or traveled in the [place] to [place], [place], [place], or [place]; in the last two weeks or you have been in close contact with a person diagnosed with COVID-19 in the last 2 weeks.   . Tell the health care staff about your symptoms: fever, cough and shortness of breath. . After you have been seen by a medical provider, you will be either: o Tested for (COVID-19) and discharged home on quarantine except to seek medical care if symptoms worsen, and asked to  - Stay home and avoid contact with others until you get your results (4-5 days)  - Avoid travel on public transportation if possible (such as bus, train, or airplane) or o Sent to the Emergency Department by EMS for evaluation, COVID-19 testing, and possible  admission depending on your condition and test results.  What to do if you are LOW RISK for COVID-19?  Reduce your risk of any infection by using the same precautions used for avoiding the common cold or flu:  . Wash your hands often with soap and warm water for at least 20 seconds.  If soap and water are not readily available, use an alcohol-based hand sanitizer with at least 60% alcohol.  . If coughing or sneezing, cover your mouth and nose by coughing or sneezing into the elbow areas of your shirt or coat, into a tissue or into your sleeve (not your hands). . Avoid shaking hands with others and consider head nods or verbal greetings only. . Avoid touching your eyes, nose, or mouth with unwashed hands.  . Avoid close contact with people who are sick. . Avoid places or events with large numbers of people in one location, like concerts or sporting events. . Carefully consider travel plans you have or are making. . If you are planning any travel outside or inside the [place], visit the CDC's Travelers' Health webpage for the latest health notices. . If you have some symptoms but not all symptoms, continue to monitor at home and seek medical attention if your symptoms worsen. . If you are having a medical emergency, call 911.   ADDITIONAL HEALTHCARE OPTIONS FOR PATIENTS  [facility] / e-Visit: [email or web address]         [facility]: [phone]  [facility]: [phone]                   [facility]: [phone]   

## 2019-04-01 ENCOUNTER — Other Ambulatory Visit: Payer: Self-pay | Admitting: Orthopaedic Surgery

## 2019-04-09 NOTE — Patient Instructions (Addendum)
DUE TO COVID-19 ONLY ONE VISITOR IS ALLOWED TO COME WITH YOU AND STAY IN THE WAITING ROOM ONLY DURING PRE OP AND PROCEDURE DAY OF SURGERY. THE 1 VISITOR MAY VISIT WITH YOU AFTER SURGERY IN YOUR PRIVATE ROOM DURING VISITING HOURS ONLY!  YOU NEED TO HAVE A COVID 19 TEST ON 04/11/2019, ONCE YOUR COVID TEST IS COMPLETED, PLEASE CONTINUE THE QUARANTINE INSTRUCTIONS AS OUTLINED IN YOUR HANDOUT.                Ashley Villegas    Your procedure is scheduled on: 04/15/2019   Report to [facility] Main  Entrance    Report to Admitting at 8:45 AM     Call this number if you have problems the morning of surgery [phone]    Remember: NO SOLID FOOD AFTER MIDNIGHT THE NIGHT PRIOR TO SURGERY. YOU MAY DRINK CLEAR LIQUIDS.   STOP CLEAR LIQUIDS AT 8:15 AM AND THEN DRINK THE ENSURE PRE-SURGERY DRINK. NOTHING BY MOUTH AFTER THE ENSURE DRINK!    CLEAR LIQUID DIET   Foods Allowed                                                                     Foods Excluded  Coffee and tea, regular and decaf                             liquids that you cannot  Plain Jell-O any favor except red or purple                                           see through such as: Fruit ices (not with fruit pulp)                                     milk, soups, orange juice  Iced Popsicles                                    All solid food Carbonated beverages, regular and diet                                    Cranberry, grape and apple juices Sports drinks like Gatorade Lightly seasoned clear broth or consume(fat free) Sugar, honey syrup  Sample Menu Breakfast                                Lunch                                     Supper Cranberry juice                    Beef broth  Chicken broth Jell-O                                     Grape juice                           Apple juice Coffee or tea                        Jell-O                                      Popsicle                                                 Coffee or tea                        Coffee or tea  _____________________________________________________________________  BRUSH YOUR TEETH MORNING OF SURGERY AND RINSE YOUR MOUTH OUT, NO CHEWING GUM CANDY OR MINTS.     Take these medicines the morning of surgery with A SIP OF WATER: FLUOXETINE, SYNTHROID                                 You may not have any metal on your body including hair pins and              piercings              Do not wear jewelry, make-up, lotions, powders or perfumes, deodorant              Do not wear nail polish on your fingernails.  Do not shave  48 hours prior to surgery.     Do not bring valuables to the hospital. [place].  Contacts, dentures or bridgework may not be worn into surgery.      Patients discharged the day of surgery will not be allowed to drive home. IF YOU ARE HAVING SURGERY AND GOING HOME THE SAME DAY, YOU MUST HAVE AN ADULT TO DRIVE YOU HOME AND BE WITH YOU FOR 24 HOURS. YOU MAY GO HOME BY TAXI OR UBER OR ORTHERWISE, BUT AN ADULT MUST ACCOMPANY YOU HOME AND STAY WITH YOU FOR 24 HOURS.  Name and phone number of your driver:  Ashley Villegas [phone]               Please read over the following fact sheets you were given: _____________________________________________________________________             [facility] - Preparing for Surgery Before surgery, you can play an important role.  Because skin is not sterile, your skin needs to be as free of germs as possible.  You can reduce the number of germs on your skin by washing with CHG (chlorahexidine gluconate) soap before surgery.  CHG is an antiseptic cleaner which kills germs and bonds with the skin to continue killing germs even after washing. Please DO NOT use if  you have an allergy to CHG or antibacterial soaps.  If your skin becomes reddened/irritated stop using the CHG and inform your  nurse when you arrive at Short Stay. Do not shave (including legs and underarms) for at least 48 hours prior to the first CHG shower.  You may shave your face/neck. Please follow these instructions carefully:  1.  Shower with CHG Soap the night before surgery and the  morning of Surgery.  2.  If you choose to wash your hair, wash your hair first as usual with your  normal  shampoo.  3.  After you shampoo, rinse your hair and body thoroughly to remove the  shampoo.                           4.  Use CHG as you would any other liquid soap.  You can apply chg directly  to the skin and wash                       Gently with a scrungie or clean washcloth.  5.  Apply the CHG Soap to your body ONLY FROM THE NECK DOWN.   Do not use on face/ open                           Wound or open sores. Avoid contact with eyes, ears mouth and genitals (private parts).                       Wash face,  Genitals (private parts) with your normal soap.             6.  Wash thoroughly, paying special attention to the area where your surgery  will be performed.  7.  Thoroughly rinse your body with warm water from the neck down.  8.  DO NOT shower/wash with your normal soap after using and rinsing off  the CHG Soap.                9.  Pat yourself dry with a clean towel.            10.  Wear clean pajamas.            11.  Place clean sheets on your bed the night of your first shower and do not  sleep with pets. Day of Surgery : Do not apply any lotions/deodorants the morning of surgery.  Please wear clean clothes to the hospital/surgery center.  FAILURE TO FOLLOW THESE INSTRUCTIONS MAY RESULT IN THE CANCELLATION OF YOUR SURGERY PATIENT SIGNATURE_________________________________  NURSE SIGNATURE__________________________________  ________________________________________________________________________   Ashley Villegas  An incentive spirometer is a tool that can help keep your lungs clear and active. This tool  measures how well you are filling your lungs with each breath. Taking long deep breaths may help reverse or decrease the chance of developing breathing (pulmonary) problems (especially infection) following:  A long period of time when you are unable to move or be active. BEFORE THE PROCEDURE   If the spirometer includes an indicator to show your best effort, your nurse or respiratory therapist will set it to a desired goal.  If possible, sit up straight or lean slightly forward. Try not to slouch.  Hold the incentive spirometer in an upright position. INSTRUCTIONS FOR USE  1. Sit on the edge of your bed if possible, or sit  up as far as you can in bed or on a chair. 2. Hold the incentive spirometer in an upright position. 3. Breathe out normally. 4. Place the mouthpiece in your mouth and seal your lips tightly around it. 5. Breathe in slowly and as deeply as possible, raising the piston or the ball toward the top of the column. 6. Hold your breath for 3-5 seconds or for as long as possible. Allow the piston or ball to fall to the bottom of the column. 7. Remove the mouthpiece from your mouth and breathe out normally. 8. Rest for a few seconds and repeat Steps 1 through 7 at least 10 times every 1-2 hours when you are awake. Take your time and take a few normal breaths between deep breaths. 9. The spirometer may include an indicator to show your best effort. Use the indicator as a goal to work toward during each repetition. 10. After each set of 10 deep breaths, practice coughing to be sure your lungs are clear. If you have an incision (the cut made at the time of surgery), support your incision when coughing by placing a pillow or rolled up towels firmly against it. Once you are able to get out of bed, walk around indoors and cough well. You may stop using the incentive spirometer when instructed by your caregiver.  RISKS AND COMPLICATIONS  Take your time so you do not get dizzy or  light-headed.  If you are in pain, you may need to take or ask for pain medication before doing incentive spirometry. It is harder to take a deep breath if you are having pain. AFTER USE  Rest and breathe slowly and easily.  It can be helpful to keep track of a log of your progress. Your caregiver can provide you with a simple table to help with this. If you are using the spirometer at home, follow these instructions: [place] IF:   You are having difficultly using the spirometer.  You have trouble using the spirometer as often as instructed.  Your pain medication is not giving enough relief while using the spirometer.  You develop fever of 100.5 F (38.1 C) or higher. SEEK IMMEDIATE MEDICAL CARE IF:   You cough up bloody sputum that had not been present before.  You develop fever of 102 F (38.9 C) or greater.  You develop worsening pain at or near the incision site. MAKE SURE YOU:   Understand these instructions.  Will watch your condition.  Will get help right away if you are not doing well or get worse. Document Released: 07/23/2006 Document Revised: 06/04/2011 Document Reviewed: 09/23/2006 ExitCare Patient Information 2014 ExitCare, [place].   ________________________________________________________________________  WHAT IS A BLOOD TRANSFUSION? Blood Transfusion Information  A transfusion is the replacement of blood or some of its parts. Blood is made up of multiple cells which provide different functions.  Red blood cells carry oxygen and are used for blood loss replacement.  White blood cells fight against infection.  Platelets control bleeding.  Plasma helps clot blood.  Other blood products are available for specialized needs, such as hemophilia or other clotting disorders. BEFORE THE TRANSFUSION  Who gives blood for transfusions?   Healthy volunteers who are fully evaluated to make sure their blood is safe. This is blood bank  blood. Transfusion therapy is the safest it has ever been in the practice of medicine. Before blood is taken from a donor, a complete history is taken to make sure that person has  no history of diseases nor engages in risky social behavior (examples are intravenous drug use or sexual activity with multiple partners). The donor's travel history is screened to minimize risk of transmitting infections, such as malaria. The donated blood is tested for signs of infectious diseases, such as HIV and hepatitis. The blood is then tested to be sure it is compatible with you in order to minimize the chance of a transfusion reaction. If you or a relative donates blood, this is often done in anticipation of surgery and is not appropriate for emergency situations. It takes many days to process the donated blood. RISKS AND COMPLICATIONS Although transfusion therapy is very safe and saves many lives, the main dangers of transfusion include:   Getting an infectious disease.  Developing a transfusion reaction. This is an allergic reaction to something in the blood you were given. Every precaution is taken to prevent this. The decision to have a blood transfusion has been considered carefully by your caregiver before blood is given. Blood is not given unless the benefits outweigh the risks. AFTER THE TRANSFUSION  Right after receiving a blood transfusion, you will usually feel much better and more energetic. This is especially true if your red blood cells have gotten low (anemic). The transfusion raises the level of the red blood cells which carry oxygen, and this usually causes an energy increase.  The nurse administering the transfusion will monitor you carefully for complications. HOME CARE INSTRUCTIONS  No special instructions are needed after a transfusion. You may find your energy is better. Speak with your caregiver about any limitations on activity for underlying diseases you may have. SEEK MEDICAL CARE IF:    Your condition is not improving after your transfusion.  You develop redness or irritation at the intravenous (IV) site. SEEK IMMEDIATE MEDICAL CARE IF:  Any of the following symptoms occur over the next 12 hours:  Shaking chills.  You have a temperature by mouth above 102 F (38.9 C), not controlled by medicine.  Chest, back, or muscle pain.  People around you feel you are not acting correctly or are confused.  Shortness of breath or difficulty breathing.  Dizziness and fainting.  You get a rash or develop hives.  You have a decrease in urine output.  Your urine turns a dark color or changes to pink, red, or brown. Any of the following symptoms occur over the next 10 days:  You have a temperature by mouth above 102 F (38.9 C), not controlled by medicine.  Shortness of breath.  Weakness after normal activity.  The white part of the eye turns yellow (jaundice).  You have a decrease in the amount of urine or are urinating less often.  Your urine turns a dark color or changes to pink, red, or brown. Document Released: 03/09/2000 Document Revised: 06/04/2011 Document Reviewed: 10/27/2007 [facility] Patient Information 2014 [place], [place].  _______________________________________________________________________

## 2019-04-10 ENCOUNTER — Other Ambulatory Visit: Payer: Self-pay | Admitting: Orthopaedic Surgery

## 2019-04-10 NOTE — H&P (Signed)
TOTAL KNEE ADMISSION H&P  Patient is being admitted for left total knee arthroplasty.  Subjective:  Chief Complaint:left knee pain.  HPI: Ashley Villegas, 69 y.o. female, has a history of pain and functional disability in the left knee due to arthritis and has failed non-surgical conservative treatments for greater than 12 weeks to includeNSAID's and/or analgesics, corticosteriod injections, flexibility and strengthening excercises, use of assistive devices, weight reduction as appropriate and activity modification.  Onset of symptoms was gradual, starting 5 years ago with gradually worsening course since that time. The patient noted prior procedures on the knee to include  arthroscopy on the left knee(s).  Patient currently rates pain in the left knee(s) at 10 out of 10 with activity. Patient has night pain, worsening of pain with activity and weight bearing, pain that interferes with activities of daily living, crepitus and joint swelling.  Patient has evidence of subchondral cysts, subchondral sclerosis, periarticular osteophytes and joint space narrowing by imaging studies. There is no active infection.  Patient Active Problem List   Diagnosis Date Noted  . Inflammatory arthritis 03/14/2018  . Neuropathy 01/16/2018  . Vitamin D deficiency 11/11/2017  . Hypothyroid 11/09/2017  . Total body pain 11/09/2017  . Sinusitis 10/11/2017  . Mediastinal lymphadenopathy 10/11/2017  . Skin rash 10/11/2017  . Genetic testing 08/29/2017  . Hypothyroidism 08/08/2017  . 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 Depression 07/30/2017  . Encounter for antineoplastic immunotherapy 07/30/2017  . 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 Groin pain, right 07/30/2017  . Family history of uterine cancer   . Left groin pain   . Endometrial cancer [facility])    Past Medical History:  Diagnosis Date  . #[id number] dx'd 2016   endometrial cancer  . Anxiety   . Arthritis   . Complication of  anesthesia   . Depression   . Family history of uterine cancer   . GERD (gastroesophageal reflux disease)   . History of radiation therapy 11/08/2016-12/27/2016   pelvis 45 Gy in 25 fractions, pelvis boost 14 gy in 7 fractions  . Hypothyroidism    on synthroid   . PONV (postoperative nausea and vomiting)     Past Surgical History:  Procedure Laterality Date  . CESAREAN SECTION    . COLONOSCOPY    . DIAGNOSTIC LAPAROSCOPY     fibroid tumors on her ovaries   . DILATION AND CURETTAGE OF UTERUS    . HAND SURGERY Right 2019   carpal tunnel  . HYSTEROSCOPY WITH D & C N/A 09/15/2014   Procedure: DILATATION AND CURETTAGE /HYSTEROSCOPY/MYOSURE RESECTION OF POLYP;  Surgeon: Janyth Pupa, DO;  Location: [facility];  Service: Gynecology;  Laterality: N/A;  . KNEE SURGERY    . LYMPH NODE DISSECTION N/A 10/26/2014   Procedure: POSS LYMPHADENECTOMY ;  Surgeon: [name], MD;  Location: [facility];  Service: Gynecology;  Laterality: N/A;  . ROBOTIC ASSISTED TOTAL HYSTERECTOMY WITH BILATERAL SALPINGO OOPHERECTOMY Bilateral 10/26/2014   Procedure: ROBOTIC ASSISTED TOTAL HYSTERECTOMY WITH BILATERAL SALPINGO OOPHORECTOMY;  Surgeon: [name], MD;  Location: [facility];  Service: Gynecology;  Laterality: Bilateral;  . TUBAL LIGATION    . WISDOM TOOTH EXTRACTION      No current facility-administered medications for this encounter.   Current Outpatient Medications  Medication Sig Dispense Refill Last Dose  . Cholecalciferol (VITAMIN D) 50 MCG (2000 UT) CAPS Take 2,000 Units by mouth daily.      Marland Kitchen FLUoxetine (PROZAC) 40 MG capsule Take 40 mg by mouth daily.     Marland Kitchen  SYNTHROID 100 MCG tablet Take 100 mcg by mouth daily before breakfast.       Allergies  Allergen Reactions  . Hydrocodone Rash    Social History   Tobacco Use  . Smoking status: Former Smoker    Packs/day: 0.50    Years: 13.00    Pack years: 6.50    Types: Cigarettes    Quit date: 10/20/2007    Years since quitting: 11.4  . Smokeless tobacco:  Never Used  Substance Use Topics  . Alcohol use: No    Family History  Problem Relation Age of Onset  . Breast cancer Maternal Aunt        dx late 50's/60's, died in 21's  . Uterine cancer Mother 69  . Heart failure Mother   . Dementia Father   . Heart attack Paternal Grandfather 58  . Other Sister 32       had abnromal finding on mammogram, unsure if cancer. Was told to 'remove part of of body'     Review of Systems  Musculoskeletal: Positive for arthralgias.       Left knee  All other systems reviewed and are negative.   Objective:  Physical Exam  Constitutional: She is oriented to person, place, and time. She appears well-developed and well-nourished.  HENT:  Head: Normocephalic and atraumatic.  Eyes: Pupils are equal, round, and reactive to light.  Cardiovascular: Normal rate and regular rhythm.  Respiratory: Effort normal.  GI: Soft.  Musculoskeletal:     Cervical back: Normal range of motion.     Comments: Left knee range of motion is about 0-110.  She has medial joint line pain and crepitation but no effusion.  Hip motion is full and straight leg raise is negative.  Sensation and motor function are intact in her feet with palpable pulses on both sides.    Neurological: She is alert and oriented to person, place, and time.  Skin: Skin is warm and dry.  Psychiatric: She has a normal mood and affect. Her behavior is normal. Judgment and thought content normal.    Vital signs in last 24 hours: BP: ()/()  Arterial Line BP: ()/()   Labs:   Estimated body mass index is 35.47 kg/m as calculated from the following:   Height as of 01/15/19: 5' (1.524 m).   Weight as of 01/15/19: 82.4 kg.   Imaging Review Plain radiographs demonstrate severe degenerative joint disease of the left knee(s). The overall alignment isneutral. The bone quality appears to be good for age and reported activity level.      Assessment/Plan:  End stage primary arthritis, left knee    The patient history, physical examination, clinical judgment of the provider and imaging studies are consistent with end stage degenerative joint disease of the left knee(s) and total knee arthroplasty is deemed medically necessary. The treatment options including medical management, injection therapy arthroscopy and arthroplasty were discussed at length. The risks and benefits of total knee arthroplasty were presented and reviewed. The risks due to aseptic loosening, infection, stiffness, patella tracking problems, thromboembolic complications and other imponderables were discussed. The patient acknowledged the explanation, agreed to proceed with the plan and consent was signed. Patient is being admitted for inpatient treatment for surgery, pain control, PT, OT, prophylactic antibiotics, VTE prophylaxis, progressive ambulation and ADL's and discharge planning. The patient is planning to be discharged home with home health services  Patient's anticipated LOS is less than 2 midnights, meeting these requirements: - Younger than 45 - Lives  within 1 hour of care - Has a competent adult at home to recover with post-op recover - NO history of  - Chronic pain requiring opiods  - Diabetes  - Coronary Artery Disease  - Heart failure  - Heart attack  - Stroke  - DVT/VTE  - Cardiac arrhythmia  - Respiratory Failure/COPD  - Renal failure  - Anemia  - Advanced Liver disease

## 2019-04-11 ENCOUNTER — Other Ambulatory Visit (HOSPITAL_COMMUNITY)
Admission: RE | Admit: 2019-04-11 | Discharge: 2019-04-11 | Disposition: A | Discharge status: Home / Self Care | Payer: 59 | Source: Ambulatory Visit | Attending: Orthopaedic Surgery | Admitting: Orthopaedic Surgery

## 2019-04-11 DIAGNOSIS — Z01812 Encounter for preprocedural laboratory examination: Secondary | ICD-10-CM | POA: Diagnosis present

## 2019-04-11 DIAGNOSIS — Z20822 Contact with and (suspected) exposure to covid-19: Secondary | ICD-10-CM | POA: Diagnosis not present

## 2019-04-12 LAB — NOVEL CORONAVIRUS, NAA (HOSP ORDER, SEND-OUT TO REF LAB; TAT 18-24 HRS): SARS-CoV-2, NAA: NOT DETECTED

## 2019-04-12 NOTE — Care Plan (Signed)
Ortho Bundle Case Management Note  Patient Details  Name: Ashley Villegas MRN: [id number] Date of Birth: 03-28-1950  Patient plans to discharge to home with family. HHPT referral to [facility]. OPPT set up with [place]. Rolling walker ordered from [place].  Patient and MD in agreement with plan. Choice offered.                     DME Arranged:  [place] rolling DME Agency:  [employer]  HH Arranged:  PT [place] Agency:  [facility] (formerly [facility])  Additional Comments: Please contact me with any questions of if this plan should need to change.  Ladell Heads,  [facility]  [phone] 04/12/2019, 10:31 PM

## 2019-04-14 ENCOUNTER — Ambulatory Visit (HOSPITAL_COMMUNITY)
Admission: RE | Admit: 2019-04-14 | Discharge: 2019-04-14 | Disposition: A | Discharge status: Home / Self Care | Payer: 59 | Source: Ambulatory Visit | Attending: Orthopaedic Surgery | Admitting: Orthopaedic Surgery

## 2019-04-14 ENCOUNTER — Encounter (HOSPITAL_COMMUNITY)
Admission: RE | Admit: 2019-04-14 | Discharge: 2019-04-14 | Disposition: A | Discharge status: Home / Self Care | Payer: 59 | Source: Ambulatory Visit | Attending: Orthopaedic Surgery | Admitting: Orthopaedic Surgery

## 2019-04-14 ENCOUNTER — Other Ambulatory Visit: Payer: Self-pay

## 2019-04-14 ENCOUNTER — Encounter (HOSPITAL_COMMUNITY): Payer: Self-pay

## 2019-04-14 DIAGNOSIS — Z01818 Encounter for other preprocedural examination: Secondary | ICD-10-CM | POA: Diagnosis present

## 2019-04-14 HISTORY — DX: Carpal tunnel syndrome, bilateral upper limbs: G56.03

## 2019-04-14 LAB — CBC WITH DIFFERENTIAL/PLATELET
Abs Immature Granulocytes: 0.02 10*3/uL (ref 0.00–0.07)
Basophils Absolute: 0.1 10*3/uL (ref 0.0–0.1)
Basophils Relative: 2 %
Eosinophils Absolute: 0.3 10*3/uL (ref 0.0–0.5)
Eosinophils Relative: 5 %
HCT: 41.5 % (ref 36.0–46.0)
Hemoglobin: 13.9 g/dL (ref 12.0–15.0)
Immature Granulocytes: 0 %
Lymphocytes Relative: 20 %
Lymphs Abs: 1 10*3/uL (ref 0.7–4.0)
MCH: 31.2 pg (ref 26.0–34.0)
MCHC: 33.5 g/dL (ref 30.0–36.0)
MCV: 93 fL (ref 80.0–100.0)
Monocytes Absolute: 0.5 10*3/uL (ref 0.1–1.0)
Monocytes Relative: 9 %
Neutro Abs: 3 10*3/uL (ref 1.7–7.7)
Neutrophils Relative %: 64 %
Platelets: 234 10*3/uL (ref 150–400)
RBC: 4.46 MIL/uL (ref 3.87–5.11)
RDW: 13.1 % (ref 11.5–15.5)
WBC: 4.8 10*3/uL (ref 4.0–10.5)
nRBC: 0 % (ref 0.0–0.2)

## 2019-04-14 LAB — SURGICAL PCR SCREEN
MRSA, PCR: NEGATIVE
Staphylococcus aureus: NEGATIVE

## 2019-04-14 LAB — APTT: aPTT: 24 seconds (ref 24–36)

## 2019-04-14 LAB — URINALYSIS, ROUTINE W REFLEX MICROSCOPIC
Bilirubin Urine: NEGATIVE
Glucose, UA: NEGATIVE mg/dL
Hgb urine dipstick: NEGATIVE
Ketones, ur: NEGATIVE mg/dL
Leukocytes,Ua: NEGATIVE
Nitrite: NEGATIVE
Protein, ur: NEGATIVE mg/dL
Specific Gravity, Urine: 1.009 (ref 1.005–1.030)
pH: 6 (ref 5.0–8.0)

## 2019-04-14 LAB — BASIC METABOLIC PANEL
Anion gap: 7 (ref 5–15)
BUN: 15 mg/dL (ref 8–23)
CO2: 28 mmol/L (ref 22–32)
Calcium: 9.3 mg/dL (ref 8.9–10.3)
Chloride: 104 mmol/L (ref 98–111)
Creatinine, Ser: 0.83 mg/dL (ref 0.44–1.00)
GFR calc Af Amer: >60 mL/min (ref >60)
GFR calc non Af Amer: >60 mL/min (ref >60)
Glucose, Bld: 105 mg/dL — ABNORMAL HIGH (ref 70–99)
Potassium: 4.1 mmol/L (ref 3.5–5.1)
Sodium: 139 mmol/L (ref 135–145)

## 2019-04-14 LAB — PROTIME-INR
INR: 1 (ref 0.8–1.2)
Prothrombin Time: 12.6 seconds (ref 11.4–15.2)

## 2019-04-14 MED ORDER — BUPIVACAINE LIPOSOME 1.3 % IJ SUSP
20.0000 mL | INTRAMUSCULAR | Status: DC
  Filled 2019-04-14: qty 20

## 2019-04-14 MED ORDER — TRANEXAMIC ACID 1000 MG/10ML IV SOLN
2000.0000 mg | INTRAVENOUS | Status: DC
  Filled 2019-04-14: qty 20

## 2019-04-15 ENCOUNTER — Ambulatory Visit (HOSPITAL_COMMUNITY): Payer: No Typology Code available for payment source | Admitting: Physician Assistant

## 2019-04-15 ENCOUNTER — Encounter (HOSPITAL_COMMUNITY): Payer: Self-pay | Admitting: Orthopaedic Surgery

## 2019-04-15 ENCOUNTER — Encounter (HOSPITAL_COMMUNITY)
Admission: RE | Disposition: A | Discharge status: Home / Self Care | Payer: Self-pay | Source: Home / Self Care | Attending: Orthopaedic Surgery

## 2019-04-15 ENCOUNTER — Ambulatory Visit (HOSPITAL_COMMUNITY)
Admission: RE | Admit: 2019-04-15 | Discharge: 2019-04-15 | Disposition: A | Discharge status: Home / Self Care | Payer: No Typology Code available for payment source | Attending: Orthopaedic Surgery | Admitting: Orthopaedic Surgery

## 2019-04-15 DIAGNOSIS — Z79890 Hormone replacement therapy (postmenopausal): Secondary | ICD-10-CM | POA: Insufficient documentation

## 2019-04-15 DIAGNOSIS — Z79899 Other long term (current) drug therapy: Secondary | ICD-10-CM | POA: Diagnosis not present

## 2019-04-15 DIAGNOSIS — Z87891 Personal history of nicotine dependence: Secondary | ICD-10-CM | POA: Insufficient documentation

## 2019-04-15 DIAGNOSIS — Z8542 Personal history of malignant neoplasm of other parts of uterus: Secondary | ICD-10-CM | POA: Insufficient documentation

## 2019-04-15 DIAGNOSIS — Z923 Personal history of irradiation: Secondary | ICD-10-CM | POA: Insufficient documentation

## 2019-04-15 DIAGNOSIS — F329 Major depressive disorder, single episode, unspecified: Secondary | ICD-10-CM | POA: Diagnosis not present

## 2019-04-15 DIAGNOSIS — K219 Gastro-esophageal reflux disease without esophagitis: Secondary | ICD-10-CM | POA: Diagnosis not present

## 2019-04-15 DIAGNOSIS — E559 Vitamin D deficiency, unspecified: Secondary | ICD-10-CM | POA: Diagnosis not present

## 2019-04-15 DIAGNOSIS — Z85118 Personal history of other malignant neoplasm of bronchus and lung: Secondary | ICD-10-CM | POA: Insufficient documentation

## 2019-04-15 DIAGNOSIS — M1712 Unilateral primary osteoarthritis, left knee: Secondary | ICD-10-CM | POA: Diagnosis present

## 2019-04-15 DIAGNOSIS — E039 Hypothyroidism, unspecified: Secondary | ICD-10-CM | POA: Diagnosis not present

## 2019-04-15 DIAGNOSIS — F419 Anxiety disorder, unspecified: Secondary | ICD-10-CM | POA: Insufficient documentation

## 2019-04-15 HISTORY — PX: TOTAL KNEE ARTHROPLASTY: SHX125

## 2019-04-15 LAB — TYPE AND SCREEN
ABO/RH(D): A POS
Antibody Screen: NEGATIVE

## 2019-04-15 SURGERY — ARTHROPLASTY, KNEE, TOTAL
Anesthesia: Monitor Anesthesia Care | Site: Knee | Laterality: Left

## 2019-04-15 MED ORDER — OXYCODONE HCL 5 MG PO TABS: 5.0000 mg | ORAL_TABLET | ORAL | Status: DC | PRN

## 2019-04-15 MED ORDER — CEFAZOLIN SODIUM-DEXTROSE 2-4 GM/100ML-% IV SOLN: 2.0000 g | Freq: Four times a day (QID) | INTRAVENOUS | Status: DC

## 2019-04-15 MED ORDER — CEFAZOLIN SODIUM-DEXTROSE 2-4 GM/100ML-% IV SOLN
2.0000 g | INTRAVENOUS | Status: AC
  Administered 2019-04-15: 2 g via INTRAVENOUS
  Filled 2019-04-15: qty 100

## 2019-04-15 MED ORDER — ONDANSETRON HCL 4 MG/2ML IJ SOLN
INTRAMUSCULAR | Status: DC | PRN
  Administered 2019-04-15: 4 mg via INTRAVENOUS

## 2019-04-15 MED ORDER — DEXAMETHASONE SODIUM PHOSPHATE 10 MG/ML IJ SOLN
INTRAMUSCULAR | Status: AC
  Filled 2019-04-15: qty 1

## 2019-04-15 MED ORDER — ACETAMINOPHEN 325 MG PO TABS: 325.0000 mg | ORAL_TABLET | ORAL | Status: DC | PRN

## 2019-04-15 MED ORDER — ACETAMINOPHEN 500 MG PO TABS
ORAL_TABLET | ORAL | Status: AC
  Administered 2019-04-15: 1000 mg via ORAL
  Filled 2019-04-15: qty 2

## 2019-04-15 MED ORDER — MIDAZOLAM HCL 2 MG/2ML IJ SOLN
1.0000 mg | Freq: Once | INTRAMUSCULAR | Status: AC
  Administered 2019-04-15: 2 mg via INTRAVENOUS
  Filled 2019-04-15: qty 2

## 2019-04-15 MED ORDER — METHOCARBAMOL 500 MG IVPB - SIMPLE MED
INTRAVENOUS | Status: AC
  Administered 2019-04-15: 500 mg via INTRAVENOUS
  Filled 2019-04-15: qty 50

## 2019-04-15 MED ORDER — BUPIVACAINE-EPINEPHRINE 0.5% -1:200000 IJ SOLN
INTRAMUSCULAR | Status: DC | PRN
  Administered 2019-04-15: 30 mL

## 2019-04-15 MED ORDER — LACTATED RINGERS IV BOLUS: 250.0000 mL | Freq: Once | INTRAVENOUS | Status: DC

## 2019-04-15 MED ORDER — KETOROLAC TROMETHAMINE 15 MG/ML IJ SOLN
INTRAMUSCULAR | Status: AC
  Administered 2019-04-15: 7.5 mg via INTRAVENOUS
  Filled 2019-04-15: qty 1

## 2019-04-15 MED ORDER — LACTATED RINGERS IV BOLUS
250.0000 mL | Freq: Once | INTRAVENOUS | Status: AC
  Administered 2019-04-15: 250 mL via INTRAVENOUS

## 2019-04-15 MED ORDER — PROPOFOL 500 MG/50ML IV EMUL
INTRAVENOUS | Status: AC
  Filled 2019-04-15: qty 50

## 2019-04-15 MED ORDER — METHOCARBAMOL 500 MG PO TABS: 500.0000 mg | ORAL_TABLET | Freq: Four times a day (QID) | ORAL | Status: DC | PRN

## 2019-04-15 MED ORDER — ASPIRIN EC 81 MG PO TBEC: 81.0000 mg | DELAYED_RELEASE_TABLET | Freq: Every day | ORAL | 0 refills | Status: DC

## 2019-04-15 MED ORDER — LACTATED RINGERS IV SOLN: INTRAVENOUS | Status: DC

## 2019-04-15 MED ORDER — PROPOFOL 500 MG/50ML IV EMUL
INTRAVENOUS | Status: DC | PRN
  Administered 2019-04-15: 40 ug/kg/min via INTRAVENOUS

## 2019-04-15 MED ORDER — OXYCODONE HCL 5 MG PO TABS
ORAL_TABLET | ORAL | Status: AC
  Administered 2019-04-15: 10 mg via ORAL
  Filled 2019-04-15: qty 2

## 2019-04-15 MED ORDER — TRANEXAMIC ACID 1000 MG/10ML IV SOLN
INTRAVENOUS | Status: DC | PRN
  Administered 2019-04-15: 2000 mg via TOPICAL

## 2019-04-15 MED ORDER — SODIUM CHLORIDE (PF) 0.9 % IJ SOLN
INTRAMUSCULAR | Status: DC | PRN
  Administered 2019-04-15: 30 mL

## 2019-04-15 MED ORDER — LACTATED RINGERS IV BOLUS
500.0000 mL | Freq: Once | INTRAVENOUS | Status: AC
  Administered 2019-04-15: 500 mL via INTRAVENOUS

## 2019-04-15 MED ORDER — OXYCODONE HCL 5 MG/5ML PO SOLN: 5.0000 mg | Freq: Once | ORAL | Status: DC | PRN

## 2019-04-15 MED ORDER — ACETAMINOPHEN 160 MG/5ML PO SOLN: 325.0000 mg | ORAL | Status: DC | PRN

## 2019-04-15 MED ORDER — ROPIVACAINE HCL 7.5 MG/ML IJ SOLN
INTRAMUSCULAR | Status: DC | PRN
  Administered 2019-04-15: 30 mL via PERINEURAL

## 2019-04-15 MED ORDER — DEXAMETHASONE SODIUM PHOSPHATE 10 MG/ML IJ SOLN
INTRAMUSCULAR | Status: DC | PRN
  Administered 2019-04-15: 8 mg
  Administered 2019-04-15: 10 mg

## 2019-04-15 MED ORDER — ONDANSETRON HCL 4 MG/2ML IJ SOLN: 4.0000 mg | Freq: Once | INTRAMUSCULAR | Status: DC | PRN

## 2019-04-15 MED ORDER — CHLORHEXIDINE GLUCONATE 4 % EX LIQD: 60.0000 mL | Freq: Once | CUTANEOUS | Status: DC

## 2019-04-15 MED ORDER — ACETAMINOPHEN 500 MG PO TABS: 1000.0000 mg | ORAL_TABLET | Freq: Four times a day (QID) | ORAL | Status: DC

## 2019-04-15 MED ORDER — TIZANIDINE HCL 4 MG PO TABS: 4.0000 mg | ORAL_TABLET | Freq: Four times a day (QID) | ORAL | 1 refills | Status: DC | PRN

## 2019-04-15 MED ORDER — POVIDONE-IODINE 10 % EX SWAB
2.0000 "application " | Freq: Once | CUTANEOUS | Status: AC
  Administered 2019-04-15: 2 via TOPICAL

## 2019-04-15 MED ORDER — BUPIVACAINE LIPOSOME 1.3 % IJ SUSP
INTRAMUSCULAR | Status: DC | PRN
  Administered 2019-04-15: 20 mL

## 2019-04-15 MED ORDER — 0.9 % SODIUM CHLORIDE (POUR BTL) OPTIME
TOPICAL | Status: DC | PRN
  Administered 2019-04-15: 1000 mL

## 2019-04-15 MED ORDER — EPHEDRINE SULFATE-NACL 50-0.9 MG/10ML-% IV SOSY
PREFILLED_SYRINGE | INTRAVENOUS | Status: DC | PRN
  Administered 2019-04-15 (×2): 10 mg via INTRAVENOUS

## 2019-04-15 MED ORDER — FENTANYL CITRATE (PF) 100 MCG/2ML IJ SOLN
50.0000 ug | Freq: Once | INTRAMUSCULAR | Status: AC
  Administered 2019-04-15: 100 ug via INTRAVENOUS
  Filled 2019-04-15: qty 2

## 2019-04-15 MED ORDER — MEPIVACAINE HCL (PF) 2 % IJ SOLN
INTRAMUSCULAR | Status: AC
  Filled 2019-04-15: qty 20

## 2019-04-15 MED ORDER — ONDANSETRON HCL 4 MG/2ML IJ SOLN
INTRAMUSCULAR | Status: AC
  Filled 2019-04-15: qty 2

## 2019-04-15 MED ORDER — OXYCODONE HCL 5 MG PO TABS: 10.0000 mg | ORAL_TABLET | ORAL | Status: DC | PRN

## 2019-04-15 MED ORDER — OXYCODONE HCL 5 MG PO TABS: 5.0000 mg | ORAL_TABLET | Freq: Once | ORAL | Status: DC | PRN

## 2019-04-15 MED ORDER — TRANEXAMIC ACID-NACL 1000-0.7 MG/100ML-% IV SOLN: 1000.0000 mg | Freq: Once | INTRAVENOUS | Status: DC

## 2019-04-15 MED ORDER — MEPERIDINE HCL 50 MG/ML IJ SOLN: 6.2500 mg | INTRAMUSCULAR | Status: DC | PRN

## 2019-04-15 MED ORDER — KETOROLAC TROMETHAMINE 15 MG/ML IJ SOLN: 7.5000 mg | Freq: Four times a day (QID) | INTRAMUSCULAR | Status: DC

## 2019-04-15 MED ORDER — TRANEXAMIC ACID-NACL 1000-0.7 MG/100ML-% IV SOLN
1000.0000 mg | INTRAVENOUS | Status: AC
  Administered 2019-04-15: 1000 mg via INTRAVENOUS
  Filled 2019-04-15: qty 100

## 2019-04-15 MED ORDER — FENTANYL CITRATE (PF) 100 MCG/2ML IJ SOLN: 25.0000 ug | INTRAMUSCULAR | Status: DC | PRN

## 2019-04-15 MED ORDER — METHOCARBAMOL 500 MG IVPB - SIMPLE MED: 500.0000 mg | Freq: Four times a day (QID) | INTRAVENOUS | Status: DC | PRN

## 2019-04-15 MED ORDER — SODIUM CHLORIDE 0.9 % IR SOLN
Status: DC | PRN
  Administered 2019-04-15: 1000 mL

## 2019-04-15 MED ORDER — OXYCODONE-ACETAMINOPHEN 5-325 MG PO TABS: 1.0000 | ORAL_TABLET | Freq: Four times a day (QID) | ORAL | 0 refills | Status: DC | PRN

## 2019-04-15 MED ORDER — EPHEDRINE 5 MG/ML INJ
INTRAVENOUS | Status: AC
  Filled 2019-04-15: qty 10

## 2019-04-15 MED ORDER — MEPIVACAINE HCL (PF) 2 % IJ SOLN
INTRAMUSCULAR | Status: DC | PRN
  Administered 2019-04-15: 65 mg via INTRATHECAL

## 2019-04-15 SURGICAL SUPPLY — 53 items
ATTUNE MED DOME PAT 32 KNEE (Knees) ×1 IMPLANT
ATTUNE MED DOME PAT 32MM KNEE (Knees) ×1 IMPLANT
ATTUNE PSFEM LTSZ4 NARCEM KNEE (Femur) ×2 IMPLANT
ATTUNE PSRP INSR SZ4 5 KNEE (Insert) ×1 IMPLANT
ATTUNE PSRP INSR SZ4 5MM KNEE (Insert) ×1 IMPLANT
BAG DECANTER FOR FLEXI CONT (MISCELLANEOUS) ×3 IMPLANT
BAG SPEC THK2 15X12 ZIP CLS (MISCELLANEOUS) ×1
BAG ZIPLOCK 12X15 (MISCELLANEOUS) ×3 IMPLANT
BASEPLATE TIBIAL ROTATING SZ 4 (Knees) ×2 IMPLANT
BLADE SAGITTAL 25.0X1.19X90 (BLADE) ×2 IMPLANT
BLADE SAGITTAL 25.0X1.19X90MM (BLADE) ×1
BLADE SAW SGTL 11.0X1.19X90.0M (BLADE) ×3 IMPLANT
BNDG ELASTIC 6X5.8 VLCR STR LF (GAUZE/BANDAGES/DRESSINGS) ×3 IMPLANT
BOOTIES KNEE HIGH SLOAN (MISCELLANEOUS) ×3 IMPLANT
BOWL SMART MIX CTS (DISPOSABLE) ×3 IMPLANT
BSPLAT TIB 4 CMNT ROT PLAT STR (Knees) ×1 IMPLANT
CEMENT HV SMART SET (Cement) ×6 IMPLANT
COVER SURGICAL LIGHT HANDLE (MISCELLANEOUS) ×3 IMPLANT
COVER WAND RF STERILE (DRAPES) ×3 IMPLANT
CUFF TOURN SGL QUICK 34 (TOURNIQUET CUFF) ×3
CUFF TRNQT CYL 34X4.125X (TOURNIQUET CUFF) ×1 IMPLANT
DECANTER SPIKE VIAL GLASS SM (MISCELLANEOUS) ×6 IMPLANT
DRAPE SHEET LG 3/4 BI-LAMINATE (DRAPES) ×3 IMPLANT
DRAPE TOP 10253 STERILE (DRAPES) ×3 IMPLANT
DRAPE U-SHAPE 47X51 STRL (DRAPES) ×3 IMPLANT
DRSG AQUACEL AG ADV 3.5X10 (GAUZE/BANDAGES/DRESSINGS) ×3 IMPLANT
DURAPREP 26ML APPLICATOR (WOUND CARE) ×6 IMPLANT
ELECT REM PT RETURN 15FT ADLT (MISCELLANEOUS) ×3 IMPLANT
GLOVE BIO SURGEON STRL SZ8 (GLOVE) ×8 IMPLANT
GLOVE BIOGEL PI IND STRL 8 (GLOVE) ×2 IMPLANT
GLOVE BIOGEL PI INDICATOR 8 (GLOVE) ×4
GOWN STRL REUS W/TWL XL LVL3 (GOWN DISPOSABLE) ×6 IMPLANT
HANDPIECE INTERPULSE COAX TIP (DISPOSABLE) ×3
HOLDER FOLEY CATH W/STRAP (MISCELLANEOUS) IMPLANT
HOOD PEEL AWAY FLYTE STAYCOOL (MISCELLANEOUS) ×9 IMPLANT
KIT TURNOVER KIT A (KITS) ×2 IMPLANT
MANIFOLD NEPTUNE II (INSTRUMENTS) ×3 IMPLANT
NS IRRIG 1000ML POUR BTL (IV SOLUTION) ×3 IMPLANT
PACK TOTAL KNEE CUSTOM (KITS) ×3 IMPLANT
PAD ARMBOARD 7.5X6 YLW CONV (MISCELLANEOUS) ×3 IMPLANT
PENCIL SMOKE EVACUATOR (MISCELLANEOUS) ×2 IMPLANT
PIN DRILL FIX HALF THREAD (BIT) ×2 IMPLANT
PIN STEINMAN FIXATION KNEE (PIN) ×2 IMPLANT
PROTECTOR NERVE ULNAR (MISCELLANEOUS) ×3 IMPLANT
SET HNDPC FAN SPRY TIP SCT (DISPOSABLE) ×1 IMPLANT
SUT ETHIBOND NAB CT1 #1 30IN (SUTURE) ×6 IMPLANT
SUT VIC AB 0 CT1 36 (SUTURE) ×3 IMPLANT
SUT VIC AB 2-0 CT1 27 (SUTURE) ×3
SUT VIC AB 2-0 CT1 TAPERPNT 27 (SUTURE) ×1 IMPLANT
SUT VICRYL AB 3-0 FS1 BRD 27IN (SUTURE) ×3 IMPLANT
TRAY FOLEY MTR SLVR 16FR STAT (SET/KITS/TRAYS/PACK) ×2 IMPLANT
WATER STERILE IRR 1000ML POUR (IV SOLUTION) ×5 IMPLANT
WRAP KNEE MAXI GEL POST OP (GAUZE/BANDAGES/DRESSINGS) ×3 IMPLANT

## 2019-04-15 NOTE — Op Note (Signed)
PREOP DIAGNOSIS: DJD LEFT KNEE POSTOP DIAGNOSIS:  same PROCEDURE: LEFT TKR ANESTHESIA: Spinal and MAC ATTENDING SURGEON: Hessie Dibble ASSISTANT: Loni Dolly PA  INDICATIONS FOR PROCEDURE: Ashley Villegas is a 69 y.o. female who has struggled for a long time with pain due to degenerative arthritis of the left knee.  The patient has failed many conservative non-operative measures and at this point has pain which limits the ability to sleep and walk.  The patient is offered total knee replacement.  Informed operative consent was obtained after discussion of possible risks of anesthesia, infection, neurovascular injury, DVT, and death.  The importance of the post-operative rehabilitation protocol to optimize result was stressed extensively with the patient.  SUMMARY OF FINDINGS AND PROCEDURE:  Ashley Villegas was taken to the operative suite where under the above anesthesia a left knee replacement was performed.  There were advanced degenerative changes and the bone quality was fair.  We used the DePuyAttune system and placed size 4 narrow femur, 4 tibia, 32 mm all polyethylene patella, and a size 5 mm spacer.  Loni Dolly PA-C assisted throughout and was invaluable to the completion of the case in that he helped retract and maintain exposure while I placed the components.  He also helped close thereby minimizing OR time.  The patient was admitted for appropriate post-op care to include perioperative antibiotics and mechanical and pharmacologic measures for DVT prophylaxis.  DESCRIPTION OF PROCEDURE:  Ashley Villegas was taken to the operative suite where the above anesthesia was applied.  The patient was positioned supine and prepped and draped in normal sterile fashion.  An appropriate time out was performed.  After the administration of kefzol pre-op antibiotic the leg was elevated and exsanguinated and a tourniquet inflated.  A standard longitudinal incision was made on the  anterior knee.  Dissection was carried down to the extensor mechanism.  All appropriate anti-infective measures were used including the pre-operative antibiotic, betadine impregnated drape, and closed hooded exhaust systems for each member of the surgical team.  A medial parapatellar incision was made in the extensor mechanism and the knee cap flipped and the knee flexed.  Some residual meniscal tissues were removed along with any remaining ACL/PCL tissue.  A guide was placed on the tibia and a flat cut was made on it's superior surface.  An intramedullary guide was placed in the femur and was utilized to make anterior and posterior cuts creating an appropriate flexion gap.  A second intramedullary guide was placed in the femur to make a distal cut properly balancing the knee with an extension gap equal to the flexion gap.  The three bones sized to the above mentioned sizes and the appropriate guides were placed and utilized.  A trial reduction was done and the knee easily came to full extension and the patella tracked well on flexion.  The trial components were removed and all bones were cleaned with pulsatile lavage and then dried thoroughly.  Cement was mixed and was pressurized onto the bones followed by placement of the aforementioned components.  Excess cement was trimmed and pressure was held on the components until the cement had hardened.  The tourniquet was deflated and a small amount of bleeding was controlled with cautery and pressure.  The knee was irrigated thoroughly.  The extensor mechanism was re-approximated with #1 ethibond in interrupted fashion.  The knee was flexed and the repair was solid.  The subcutaneous tissues were re-approximated with #0 and #2-0 vicryl and the skin closed with  a subcuticular stitch and steristrips.  A sterile dressing was applied.  Intraoperative fluids, EBL, and tourniquet time can be obtained from anesthesia records.  DISPOSITION:  The patient was taken to recovery  room in stable condition and admitted for appropriate post-op care to include peri-operative antibiotic and DVT prophylaxis with mechanical and pharmacologic measures.  Hessie Dibble 04/15/2019, 12:44 PM

## 2019-04-15 NOTE — Anesthesia Preprocedure Evaluation (Addendum)
Anesthesia Evaluation  Patient identified by MRN, date of birth, ID band Patient awake    Reviewed: Allergy & Precautions, NPO status , Patient's Chart, lab work & pertinent test results  History of Anesthesia Complications (+) PONV and history of anesthetic complications  Airway Mallampati: II  TM Distance: >3 FB Neck ROM: Full    Dental no notable dental hx. (+) Edentulous Upper, Upper Dentures   Pulmonary former smoker,  CXR: Chronic bronchitic changes.   Pulmonary exam normal breath sounds clear to auscultation       Cardiovascular Exercise Tolerance: Good negative cardio ROS Normal cardiovascular exam Rhythm:Regular Rate:Normal  ECG: NSR, low voltage.   Neuro/Psych PSYCHIATRIC DISORDERS Anxiety Depression negative neurological ROS     GI/Hepatic Neg liver ROS, GERD  ,  Endo/Other  Hypothyroidism   Renal/GU negative Renal ROS  negative genitourinary   Musculoskeletal  (+) Arthritis ,   Abdominal (+) + obese,   Peds negative pediatric ROS (+)  Hematology negative hematology ROS (+)   Anesthesia Other Findings   Reproductive/Obstetrics negative OB ROS                            Anesthesia Physical  Anesthesia Plan  ASA: II  Anesthesia Plan: Spinal and MAC   Post-op Pain Management:  Regional for Post-op pain   Induction: Intravenous  PONV Risk Score and Plan: 3 and Treatment may vary due to age or medical condition and Propofol infusion  Airway Management Planned: Nasal Cannula, Simple Face Mask, Mask and Natural Airway  Additional Equipment:   Intra-op Plan:   Post-operative Plan:   Informed Consent: I have reviewed the patients History and Physical, chart, labs and discussed the procedure including the risks, benefits and alternatives for the proposed anesthesia with the patient or authorized representative who has indicated his/her understanding and acceptance.      Dental advisory given  Plan Discussed with: CRNA, Anesthesiologist and Surgeon  Anesthesia Plan Comments: (  )        Anesthesia Quick Evaluation

## 2019-04-15 NOTE — Interval H&P Note (Signed)
History and Physical Interval Note:  04/15/2019 10:13 AM  Ashley Villegas  has presented today for surgery, with the diagnosis of LEFT KNEE [place].  The various methods of treatment have been discussed with the patient and family. After consideration of risks, benefits and other options for treatment, the patient has consented to  Procedure(s): LEFT TOTAL KNEE ARTHROPLASTY (Left) as a surgical intervention.  The patient's history has been reviewed, patient examined, no change in status, stable for surgery.  I have reviewed the patient's chart and labs.  Questions were answered to the patient's satisfaction.     Hessie Dibble

## 2019-04-15 NOTE — Evaluation (Signed)
Physical Therapy Evaluation Patient Details Name: Ashley Villegas MRN: [id number] DOB: April 18, 1950 Today's Date: 04/15/2019   History of Present Illness  Pt is 69 yo female s/p L TKA on 04/15/19.  See HandP for PMH.  Clinical Impression  Pt is s/p TKA resulting in the deficits listed below (see PT Problem List). Pt demonstrating mobility necessary to return home with family from PT perspective, but if still in hospital will benefit from PT to progress.  She was able to transfer, perform steps and gait with min guard.  Pt with good quad activation.   Pt will benefit from skilled PT to increase their independence and safety with mobility to allow discharge to the venue listed below.      Follow Up Recommendations Follow surgeon's recommendation for DC plan and follow-up therapies    Equipment Recommendations  None recommended by PT(Has RW, declined need for [facility])    Recommendations for Other Services       Precautions / Restrictions Precautions Precautions: Fall Restrictions Weight Bearing Restrictions: Yes LLE Weight Bearing: Weight bearing as tolerated      Mobility  Bed Mobility Overal bed mobility: Needs Assistance Bed Mobility: Supine to Sit;Sit to Supine     Supine to sit: Min guard Sit to supine: Min guard   General bed mobility comments: cues for sequence  Transfers Overall transfer level: Needs assistance Equipment used: Rolling walker (2 wheeled) Transfers: Sit to/from Stand Sit to Stand: Min guard         General transfer comment: cues for safe hand placement  Ambulation/Gait Ambulation/Gait assistance: Min guard Gait Distance (Feet): 100 Feet Assistive device: Rolling walker (2 wheeled) Gait Pattern/deviations: Step-to pattern;Step-through pattern;Decreased stride length Gait velocity: decreased   General Gait Details: began step to and progressed to step through with cues  Stairs Stairs: Yes Stairs assistance: Min guard Stair Management: One  rail Right;Step to pattern Number of Stairs: 5 General stair comments: started with 2 rails and progressed to 1; cued for sequence  Wheelchair Mobility    Modified Rankin (Stroke Patients Only)       Balance Overall balance assessment: Needs assistance Sitting-balance support: Bilateral upper extremity supported;Feet supported Sitting balance-Leahy Scale: Good     Standing balance support: Bilateral upper extremity supported;During functional activity Standing balance-Leahy Scale: Fair                               Pertinent Vitals/Pain Pain Assessment: No/denies pain    Home Living Family/patient expects to be discharged to:: Private residence Living Arrangements: Alone(But son is staying with her for a while.) Available Help at Discharge: Family Type of Home: House Home Access: Stairs to enter Entrance Stairs-Rails: Psychiatric nurse of Steps: 5 Home Layout: One level Home Equipment: [place] - single point;Walker - 2 wheels;Crutches      Prior Function Level of Independence: Independent         Comments: Works Therapist, 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, newspaper        Extremity/Trunk Assessment   Upper Extremity Assessment Upper Extremity Assessment: Overall WFL for tasks assessed    Lower Extremity Assessment Lower Extremity Assessment: LLE deficits/detail(R LE 5/5 and ROM WFL) LLE Deficits / Details: L ankle and hip ROM WFL; L knee ROM lacked 5 ext, 90 flex;  MMT ankle 5/5, hip and knee 3/5       Communication   Communication: No difficulties  Cognition Arousal/Alertness: Awake/alert Behavior  During Therapy: WFL for tasks assessed/performed Overall Cognitive Status: Within Functional Limits for tasks assessed                                        General Comments      Exercises Total Joint Exercises Ankle Circles/Pumps: AROM;Both;10 reps;Supine Quad Sets: AROM;Both;10 reps;Supine Towel Squeeze:  AROM;Both;10 reps;Supine Heel Slides: AAROM;Left;5 reps;Supine Hip ABduction/ADduction: AROM;Both;5 reps;Supine Straight Leg Raises: AAROM;Left;5 reps;Supine Long Arc Quad: AROM;Left;5 reps;Seated Knee Flexion: AROM;Left;10 reps;Seated   Assessment/Plan    PT Assessment Patient needs continued PT services  PT Problem List Decreased strength;Decreased mobility;Decreased range of motion;Decreased activity tolerance;Decreased balance;Decreased knowledge of use of DME       PT Treatment Interventions DME instruction;Therapeutic activities;Gait training;Therapeutic exercise;Patient/family education;Stair training;Functional mobility training;Balance training    PT Goals (Current goals can be found in the Care Plan section)  Acute Rehab PT Goals Patient Stated Goal: return home ASAP PT Goal Formulation: With patient Time For Goal Achievement: 04/30/19 Potential to Achieve Goals: Good    Frequency 7X/week   Barriers to discharge        Co-evaluation               AM-PAC PT "6 Clicks" Mobility  Outcome Measure Help needed turning from your back to your side while in a flat bed without using bedrails?: None Help needed moving from lying on your back to sitting on the side of a flat bed without using bedrails?: None Help needed moving to and from a bed to a chair (including a wheelchair)?: None Help needed standing up from a chair using your arms (e.g., wheelchair or bedside chair)?: None Help needed to walk in hospital room?: None Help needed climbing 3-5 steps with a railing? : None 6 Click Score: 24    End of Session Equipment Utilized During Treatment: Gait belt Activity Tolerance: Patient tolerated treatment well Patient left: in bed(in PACU; notified RN) Nurse Communication: Mobility status PT Visit Diagnosis: Other abnormalities of gait and mobility (R26.89);Muscle weakness (generalized) (M62.81)    Time: [id number] PT Time Calculation (min) (ACUTE ONLY): 33  min   Charges:   PT Evaluation $PT Eval Low Complexity: 1 Low PT Treatments $Gait Training: 8-22 mins        Maggie Font, PT Acute Rehab Services Pager [phone] [facility] [phone] [facility] [phone]   Karlton Lemon 04/15/2019, 6:32 PM

## 2019-04-15 NOTE — Progress Notes (Signed)
Assisted Dr. Oddono with left, ultrasound guided, adductor canal block. Side rails up, monitors on throughout procedure. See vital signs in flow sheet. Tolerated Procedure well.  

## 2019-04-15 NOTE — Anesthesia Procedure Notes (Signed)
Anesthesia Regional Block: Adductor canal block   Pre-Anesthetic Checklist: ,, timeout performed, Correct Patient, Correct Site, Correct Laterality, Correct Procedure, Correct Position, site marked, Risks and benefits discussed,  Surgical consent,  Pre-op evaluation,  At surgeon's request and post-op pain management  Laterality: Left  Prep: chloraprep       Needles:  Injection technique: Single-shot  Needle Type: Echogenic Stimulator Needle     Needle Length: 5cm  Needle Gauge: 22     Additional Needles:   Procedures:, nerve stimulator,,, ultrasound used (permanent image in chart),,,,  Narrative:  Start time: 04/15/2019 10:20 AM End time: 04/15/2019 10:25 AM Injection made incrementally with aspirations every 5 mL.  Performed by: Personally  Anesthesiologist: Janeece Riggers, MD  Additional Notes: Functioning IV was confirmed and monitors were applied.  A 15mm 22ga Arrow echogenic stimulator needle was used. Sterile prep and drape,hand hygiene and sterile gloves were used. Ultrasound guidance: relevant anatomy identified, needle position confirmed, local anesthetic spread visualized around nerve(s)., vascular puncture avoided.  Image printed for medical record. Negative aspiration and negative test dose prior to incremental administration of local anesthetic. The patient tolerated the procedure well.

## 2019-04-15 NOTE — Anesthesia Procedure Notes (Signed)
Spinal  Start time: 04/15/2019 11:09 AM End time: 04/15/2019 11:16 AM Staffing Performed: anesthesiologist  Anesthesiologist: Janeece Riggers, MD Resident/CRNA: Victoriano Lain, CRNA Preanesthetic Checklist Completed: patient identified, IV checked, site marked, risks and benefits discussed, surgical consent, monitors and equipment checked, pre-op evaluation and timeout performed Spinal Block Patient position: sitting Prep: DuraPrep Patient monitoring: heart rate, continuous pulse ox and blood pressure Approach: midline Location: L3-4 Injection technique: single-shot Needle Needle type: Pencan  Needle gauge: 24 G Needle length: 10 cm Assessment Sensory level: T10 Additional Notes Pt placed in sitting position for spinal placement. Spinal kit expiration date checked and verified. One attempt by CRNA. One attempt by Dr Ambrose Pancoast. +CSF, - heme. Pt tolerated well.

## 2019-04-15 NOTE — Anesthesia Postprocedure Evaluation (Signed)
Anesthesia Post Note  Patient: Ashley Villegas  Procedure(s) Performed: LEFT TOTAL KNEE ARTHROPLASTY (Left Knee)     Patient location during evaluation: PACU Anesthesia Type: MAC Level of consciousness: awake and alert Pain management: pain level controlled Vital Signs Assessment: post-procedure vital signs reviewed and stable Respiratory status: spontaneous breathing, nonlabored ventilation, respiratory function stable and patient connected to nasal cannula oxygen Cardiovascular status: stable and blood pressure returned to baseline Postop Assessment: no apparent nausea or vomiting Anesthetic complications: no    Last Vitals:  Vitals:   04/15/19 1500 04/15/19 1600  BP: 122/72 124/76  Pulse: 73 72  Resp: 17   Temp: 36.9 C   SpO2: 96% 96%    Last Pain:  Vitals:   04/15/19 1720  TempSrc:   PainSc: 3                  Mallory Schaad

## 2019-04-15 NOTE — Transfer of Care (Signed)
Immediate Anesthesia Transfer of Care Note  Patient: Ashley Villegas  Procedure(s) Performed: LEFT TOTAL KNEE ARTHROPLASTY (Left Knee)  Patient Location: PACU  Anesthesia Type:MAC and Spinal  Level of Consciousness: awake, alert , oriented and patient cooperative  Airway & Oxygen Therapy: Patient Spontanous Breathing and Patient connected to face mask oxygen  Post-op Assessment: Report given to RN and Post -op Vital signs reviewed and stable  Post vital signs: Reviewed and stable  Last Vitals:  Vitals Value Taken Time  BP 112/64 04/15/19 1313  Temp    Pulse 71 04/15/19 1314  Resp 16 04/15/19 1314  SpO2 100 % 04/15/19 1314  Vitals shown include unvalidated device data.  Last Pain:  Vitals:   04/15/19 0920  TempSrc: Oral         Complications: No apparent anesthesia complications

## 2019-04-16 ENCOUNTER — Encounter: Payer: Self-pay | Admitting: *Deleted

## 2019-05-12 ENCOUNTER — Other Ambulatory Visit: Payer: Self-pay | Admitting: Internal Medicine

## 2019-05-12 DIAGNOSIS — Z1231 Encounter for screening mammogram for malignant neoplasm of breast: Secondary | ICD-10-CM

## 2019-05-14 ENCOUNTER — Ambulatory Visit: Payer: 59

## 2019-05-22 ENCOUNTER — Other Ambulatory Visit: Payer: Self-pay

## 2019-05-22 ENCOUNTER — Inpatient Hospital Stay: Payer: 59 | Attending: Hematology and Oncology

## 2019-05-22 ENCOUNTER — Ambulatory Visit (HOSPITAL_COMMUNITY)
Admission: RE | Admit: 2019-05-22 | Discharge: 2019-05-22 | Disposition: A | Discharge status: Home / Self Care | Payer: No Typology Code available for payment source | Source: Ambulatory Visit | Attending: Hematology and Oncology | Admitting: Hematology and Oncology

## 2019-05-22 DIAGNOSIS — C541 Malignant neoplasm of endometrium: Secondary | ICD-10-CM | POA: Insufficient documentation

## 2019-05-22 DIAGNOSIS — C7800 Secondary malignant neoplasm of unspecified lung: Secondary | ICD-10-CM

## 2019-05-22 LAB — CBC WITH DIFFERENTIAL (CANCER CENTER ONLY)
Abs Immature Granulocytes: 0.01 10*3/uL (ref 0.00–0.07)
Basophils Absolute: 0.1 10*3/uL (ref 0.0–0.1)
Basophils Relative: 2 %
Eosinophils Absolute: 0.3 10*3/uL (ref 0.0–0.5)
Eosinophils Relative: 6 %
HCT: 39.5 % (ref 36.0–46.0)
Hemoglobin: 13 g/dL (ref 12.0–15.0)
Immature Granulocytes: 0 %
Lymphocytes Relative: 18 %
Lymphs Abs: 0.8 10*3/uL (ref 0.7–4.0)
MCH: 30.7 pg (ref 26.0–34.0)
MCHC: 32.9 g/dL (ref 30.0–36.0)
MCV: 93.4 fL (ref 80.0–100.0)
Monocytes Absolute: 0.5 10*3/uL (ref 0.1–1.0)
Monocytes Relative: 12 %
Neutro Abs: 2.7 10*3/uL (ref 1.7–7.7)
Neutrophils Relative %: 62 %
Platelet Count: 252 10*3/uL (ref 150–400)
RBC: 4.23 MIL/uL (ref 3.87–5.11)
RDW: 13.3 % (ref 11.5–15.5)
WBC Count: 4.3 10*3/uL (ref 4.0–10.5)
nRBC: 0 % (ref 0.0–0.2)

## 2019-05-22 LAB — CMP (CANCER CENTER ONLY)
ALT: 22 U/L (ref 0–44)
AST: 19 U/L (ref 15–41)
Albumin: 3.8 g/dL (ref 3.5–5.0)
Alkaline Phosphatase: 62 U/L (ref 38–126)
Anion gap: 9 (ref 5–15)
BUN: 14 mg/dL (ref 8–23)
CO2: 27 mmol/L (ref 22–32)
Calcium: 8.9 mg/dL (ref 8.9–10.3)
Chloride: 105 mmol/L (ref 98–111)
Creatinine: 0.75 mg/dL (ref 0.44–1.00)
GFR, Est AFR Am: >60 mL/min (ref >60)
GFR, Estimated: >60 mL/min (ref >60)
Glucose, Bld: 98 mg/dL (ref 70–99)
Potassium: 3.9 mmol/L (ref 3.5–5.1)
Sodium: 141 mmol/L (ref 135–145)
Total Bilirubin: 0.8 mg/dL (ref 0.3–1.2)
Total Protein: 6.6 g/dL (ref 6.5–8.1)

## 2019-05-22 MED ORDER — IOHEXOL 9 MG/ML PO SOLN
ORAL | Status: AC
  Filled 2019-05-22: qty 1000

## 2019-05-22 MED ORDER — IOHEXOL 9 MG/ML PO SOLN
1000.0000 mL | ORAL | Status: AC
  Administered 2019-05-22: 11:00:00 1000 mL via ORAL

## 2019-05-22 MED ORDER — SODIUM CHLORIDE (PF) 0.9 % IJ SOLN
INTRAMUSCULAR | Status: AC
  Filled 2019-05-22: qty 50

## 2019-05-22 MED ORDER — IOHEXOL 300 MG/ML  SOLN
100.0000 mL | Freq: Once | INTRAMUSCULAR | Status: AC | PRN
  Administered 2019-05-22: 14:00:00 100 mL via INTRAVENOUS

## 2019-05-25 ENCOUNTER — Other Ambulatory Visit: Payer: Self-pay

## 2019-05-25 ENCOUNTER — Telehealth: Payer: Self-pay | Admitting: Hematology and Oncology

## 2019-05-25 ENCOUNTER — Inpatient Hospital Stay: Payer: 59 | Attending: Hematology and Oncology | Admitting: Hematology and Oncology

## 2019-05-25 DIAGNOSIS — Z9221 Personal history of antineoplastic chemotherapy: Secondary | ICD-10-CM | POA: Diagnosis not present

## 2019-05-25 DIAGNOSIS — Z79899 Other long term (current) drug therapy: Secondary | ICD-10-CM | POA: Insufficient documentation

## 2019-05-25 DIAGNOSIS — E669 Obesity, unspecified: Secondary | ICD-10-CM

## 2019-05-25 DIAGNOSIS — C541 Malignant neoplasm of endometrium: Secondary | ICD-10-CM | POA: Diagnosis present

## 2019-05-25 DIAGNOSIS — C7800 Secondary malignant neoplasm of unspecified lung: Secondary | ICD-10-CM | POA: Diagnosis not present

## 2019-05-25 NOTE — Telephone Encounter (Signed)
Scheduled appt per 3/1 sch message - mailed reminder letter with appt date and time 

## 2019-05-26 ENCOUNTER — Encounter: Payer: Self-pay | Admitting: Hematology and Oncology

## 2019-05-26 DIAGNOSIS — E669 Obesity, unspecified: Secondary | ICD-10-CM | POA: Insufficient documentation

## 2019-05-26 NOTE — Assessment & Plan Note (Signed)
I have reviewed multiple CT imaging with the patient She have no clinical signs of recurrence Due to high risk situation, I plan to repeat blood work and CT imaging again in 6 months She agreed with the plan of care

## 2019-05-26 NOTE — Assessment & Plan Note (Signed)
She has class II obesity We discussed association of obesity with risks of cancer The patient appears to be motivated to lose weight through dietary modification and exercise as tolerated I encouraged the patient to lose some weight and we discussed some common strategies for weight loss

## 2019-05-26 NOTE — Progress Notes (Signed)
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   All questions were answered. The patient knows to call the clinic with any problems, questions or concerns. The total time spent in the appointment was 20 minutes encounter with patients including review of chart and various tests results, discussions about plan of care and coordination of care plan   Heath Lark, MD 05/26/2019 7:22 AM  INTERVAL HISTORY: Please see below for problem oriented charting. She returns to review test results and to discuss plan of care Since last time I saw her, she underwent knee surgery with improve mobility Clinically, she feels well No recent changes in bowel habits No recent cough, chest pain or shortness of breath  SUMMARY OF ONCOLOGIC HISTORY: Oncology History Overview Note  Abnormal MSI, germline mutation was negative Endometrioid   Endometrial cancer ([place])  09/15/2014 Pathology Results   Endometrium, curettage - ENDOMETRIAL ADENOCARCINOMA. - SEE  COMMENT. Microscopic Comment Although definitive characterization is best performed on resection specimen, as sampled, the endometrial adenocarcinoma appears to be endometrioid subtype, FIGO grade 1.    09/15/2014 Surgery   PreOp: postmenopausal bleeding, cervical stenosis PostOp: same and uterine polyp Procedure:  Hysteroscopy, Dilation and Curettage, Myosure polypectomy Surgeon: Dr. Janyth Pupa  Findings:8cm uterus with thickened polypoid-like endometrium, large 2cm irregular appearing polyp  Specimens: 1) endometrial curettings   10/26/2014 Pathology Results   1. Uterus +/- tubes/ovaries, neoplastic - INVASIVE ADENOCARCINOMA, ENDOMETRIOID TYPE, SEE COMMENT. - TUMOR INVOLVES LESS THAN ONE HALF MYOMETRIAL THICKNESS. - TUMOR INVOLVES UTERINE ADENOMYOSIS. - BENIGN LEIOMYOMATA (UP TO 2.5 CM). - BENIGN CERVICAL MUCOSA; NEGATIVE FOR INTRAEPITHELIAL LESION OR MALIGNANCY. - BENIGN RIGHT AND LEFT OVARIES; NEGATIVE FOR ATYPIA OR MALIGNANCY. - BENIGN RIGHT AND LEFT FALLOPIAN TUBE; NEGATIVE FOR ATYPIA OR MALIGNANCY. - BENIGN PARATUBAL CYSTS; NEGATIVE FOR ATYPIA OR MALIGNANCY. - SEE TUMOR SYNOPTIC TEMPLATE BELOW. 2. Lymph nodes, regional resection, right pelvic - FOUR LYMPH NODES, NEGATIVE FOR TUMOR (0/4). 3. Lymph nodes, regional resection, left pelvic - FOUR LYMPH NODES, NEGATIVE FOR TUMOR (0/4). Microscopic Comment 1. ONCOLOGY TABLE-UTERUS, CARCINOMA OR CARCINOSARCOMA Specimen: Uterus and bilateral fallopian tubes and ovaries. Procedure: Hysterectomy and bilateral salpingo-oophorectomy. Lymph node sampling performed: Yes. Specimen integrity: Intact. Maximum tumor size: 4.0 cm (tumor involved entire endometrium) Histologic type: Adenocarcinoma, endometrioid type. Grade: 1 Myometrial invasion: 0.5 cm where myometrium is 1.5 cm in thickness Cervical stromal involvement: Absent. Extent of involvement of other organs: None Lymph - vascular invasion: Absent. Peritoneal washings:  N/A Lymph nodes: # examined - 8 ; # positive - 0 Pelvic lymph nodes: N/A involved of  N/A lymph nodes. Para-aortic lymph nodes: N/A involved of N/A lymph nodes. Other (specify involvement and site): N/A TNM code: pT1a, pN0 FIGO Stage (based on pathologic findings, needs clinical correlation): N/A Comments: None MSI testing abnormal   10/26/2014 Surgery   Surgery: Total robotic hysterectomy, bilateral salpingo-oophorectomy, bilateral pelvic lymph node dissection.   Surgeons:  Lucita Lora. Alycia Rossetti, MD; Lahoma Crocker, MD   Pathology: Uterus, cervix, bilateral tubes and ovaries, bilateral pelvic lymph nodes to pathology.   Operative findings: Omentum adherent to midline vertical incision. Fibroid uterus. Normal adnexa. Adhesions of anterior bladder to uterus. Frozen section with grade 1 cancer focally involving adenomyosis, minimal myometrial invasion.   11/17/2014 Imaging   Ct abdomen 1. Status post hysterectomy. Pelvic edema which is greater than typically seen 3 weeks postop. Suspicious for postoperative infection. 2. Bladder wall thickening and irregularity. Although this could be partially due to underdistention, Concurrent cystitis cannot be excluded. 3. Bilateral fluid density lesions along the pelvic sidewalls. Favored to represent seromas or lymphangiomas. No specific features to suggest abscess. If the patient's symptoms persist after appropriate antibiotic therapy, aspiration of the largest left pelvic sidewall "Lesion" should be considered. 4. Suspicion of mild hepatic steatosis. Indeterminate right hepatic lobe lesion. If definitive characterization is desired in this patient with history of primary malignancy, nonemergent pre and post contrast abdominal MRI could be performed. 5. Hiatal hernia.   11/25/2014 Imaging   1. Persistent bilateral pelvic sidewall fluid collections, left greater than right, with left-sided inflammatory changes. 2. Technically successful 12 French left  pelvic abscess drain catheter placement. A sample of the aspirate was sent for Gram stain, culture and sensitivity.   12/08/2014 Imaging   CT abdomen and pelvis 1. Complete resolution of dominant fluid collection within the left hemipelvis following percutaneous drainage catheter placement. This percutaneous drainage catheter was subsequent removed intact at the patient's bedside. 2. Continued decrease in size of bilobed fluid collections within the right hemipelvis with superficial component measuring 1.6 cm and posterior component measuring 2.6 cm, indeterminate though both favored to represent evolving seromas. 3. Unchanged indeterminate approximately 1.2 cm hepatic lesion. Further evaluation with contrast-enhanced abdominal MRI could be performed as clinically indicated. 4. Colonic diverticulosis without evidence of diverticulitis.   10/19/2016 Imaging   New 2.6 cm soft tissue mass involving right vaginal cuff, consistent with locally recurrent carcinoma.  No other sites of metastatic disease identified.  Colonic diverticulosis, without radiographic evidence of diverticulitis.  Mild hepatic steatosis.    10/24/2016 Relapse/Recurrence   Vaginal cuff recurrence. No evidence of distant disease.   10/24/2016 Pathology Results   Vagina, biopsy, right apex - ADENOCARCINOMA - SEE COMMENT Microscopic Comment The morphologic features are consistent with the patient's previously diagnosed endometrioid adenocarcinoma   11/08/2016 - 12/27/2016 Radiation Therapy   45 Gy in 25 fractions of 1.8 Gy. The residual pelvic mass was boosted to 14 Gy in 7 fractions of 2 Gy. No intracavitary lesion seen at completion of pelvic fields to boost with brachytherapy.   06/12/2017 Imaging   CT abdomen and pelvis 1. New 1.1 by 0.8 cm right lower lobe pulmonary nodule, concerning for pulmonary metastatic disease. Possibilities for further assessment include biopsy or nuclear medicine PET-CT to assess this lesion  and the rest of the neck/chest/abdomen/pelvis for other potential hypermetabolic lesions. 2. The previous soft tissue mass along the right vaginal cuff is no longer present. 3. Stable and likely benign right hepatic lobe hypodense lesion. 4. Other imaging findings of potential clinical significance: Sigmoid colon diverticulosis. Aortic  Atherosclerosis (ICD10-I70.0). Multilevel lumbar degenerative disc disease. Mild cardiomegaly. Small type 1 hiatal hernia.   07/05/2017 Imaging   Ct chest Several bilateral pulmonary nodules measuring up to 11 mm, highly suspicious for pulmonary metastases.   No evidence of lymphadenopathy or pleural effusion.  Aortic Atherosclerosis (ICD10-I70.0).   07/25/2017 Imaging   Status post CT-guided lung nodule biopsy. Tissue specimen sent to pathology for complete histopathologic analysis.   07/25/2017 Pathology Results   Lung, needle/core biopsy(ies), RLL - METASTATIC ADENOCARCINOMA - SEE COMMENT Microscopic Comment By immunohistochemistry, the neoplastic cells are positive for Pax-8 and ER but negative for TTF-1. Overall, this immunoprofile is consistent with metastasis from the patient's known endometrioid adenocarcinoma   07/30/2017 Cancer Staging   Staging form: Corpus Uteri - Carcinoma and Carcinosarcoma, AJCC 8th Edition - Clinical: Stage IVB (rcT1, cN0, pM1) - Signed by Heath Lark, MD on 07/30/2017   08/09/2017 - 11/21/2017 Chemotherapy   The patient had pembrolizumab for chemotherapy treatment.    10/10/2017 Imaging   1. Interval decrease in size and contour. The of previously described right and left lower lobe pulmonary nodules. 2. Interval increase in size of mediastinal and hilar adenopathy, potentially metastatic in etiology. 3. Aortic Atherosclerosis (ICD10-I70.0).   11/08/2017 - 11/10/2017 Hospital Admission   She was admitted to the hospital due to uncontrolled pain   11/29/2017 Imaging   Chest Impression:  No thoracic metastasis. No discrete  pulmonary nodules identified.  Abdomen / Pelvis Impression:  No evidence of local recurrence or metastatic endometrial carcinoma in the abdomen pelvis.   02/21/2018 Imaging   1. No findings to suggest metastatic disease in the chest, abdomen or pelvis. 2. Mild colonic diverticulosis without evidence of acute diverticulitis at this time. 3. Aortic atherosclerosis. 4. Additional incidental findings, as above.   03/10/2018 Imaging   Bone scan: No scintigraphic evidence of osseous metastatic disease.   05/22/2018 Imaging   1. Stable exam. No evidence of recurrent or metastatic carcinoma within the chest, abdomen, or pelvis. 2. Colonic diverticulosis, without radiographic evidence of diverticulitis.   11/13/2018 Imaging   1. No evidence of recurrent or metastatic carcinoma within the chest, abdomen, or pelvis. 2. Colonic diverticulosis, without radiographic evidence of diverticulitis. 3. Stable hepatic steatosis.   Aortic Atherosclerosis (ICD10-I70.0).   05/22/2019 Imaging   1. No evidence of recurrent or metastatic disease in the chest, abdomen, or pelvis status post hysterectomy. 2. Previously seen metastatic pulmonary nodules in the bilateral lower lobes remain resolved. 3. Unchanged irregular opacities of the peripheral right upper lobe, likely chronic sequelae of prior infection or inflammation. 4. Hepatic steatosis. 5. Sigmoid diverticulosis. 6. Aortic Atherosclerosis (ICD10-I70.0).     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    Metastasis to lung ([place])  07/30/2017 Initial Diagnosis   Metastasis to  lung ([place])   08/09/2017 - 11/21/2017 Chemotherapy   The patient had pembrolizumab (KEYTRUDA) 200 mg in sodium chloride 0.9 % 50 mL chemo infusion, 200 mg, Intravenous, Once, 5 of 7 cycles Administration: 200 mg (08/09/2017), 200 mg (08/30/2017), 200 mg (09/20/2017), 200 mg (10/11/2017), 200 mg (11/01/2017)  for chemotherapy treatment.      REVIEW OF SYSTEMS:  Constitutional: Denies fevers, chills or abnormal weight loss Eyes: Denies blurriness of vision Ears, nose, mouth, throat, and face: Denies mucositis or sore throat Respiratory: Denies cough, dyspnea or wheezes Cardiovascular: Denies palpitation, chest discomfort or lower extremity swelling Gastrointestinal:  Denies nausea, heartburn or change in bowel habits Skin: Denies abnormal skin rashes Lymphatics: Denies new lymphadenopathy or easy bruising Neurological:Denies numbness, tingling or new weaknesses Behavioral/Psych: Mood is stable, no new changes  All other systems were reviewed with the patient and are negative.  I have reviewed the past medical history, past surgical history, social history and family history with the patient and they are unchanged from previous note.  ALLERGIES:  is allergic to oxycodone and hydrocodone.  MEDICATIONS:  Current Outpatient Medications  Medication Sig Dispense Refill  . aspirin EC 81 MG tablet Take 1 tablet (81 mg total) by mouth daily. 30 tablet 0  . Cholecalciferol (VITAMIN D) 50 MCG (2000 UT) CAPS Take 2,000 Units by mouth daily.     Marland Kitchen FLUoxetine (PROZAC) 40 MG capsule Take 40 mg by mouth daily.    Marland Kitchen SYNTHROID 100 MCG tablet Take 100 mcg by mouth daily before breakfast.      No current facility-administered medications for this visit.    PHYSICAL EXAMINATION: ECOG PERFORMANCE STATUS: 0 - Asymptomatic  Vitals:   05/25/19 0950  BP: 132/64  Pulse: 94  Resp: 18  Temp: 98 F (36.7 C)  SpO2: 97%   Filed Weights   05/25/19 0950  Weight: 182 lb 12.8 oz (82.9 kg)    GENERAL:alert, no  distress and comfortable NEURO: alert & oriented x 3 with fluent speech, no focal motor/sensory deficits  LABORATORY DATA:  I have reviewed the data as listed    Component Value Date/Time   NA 141 05/22/2019 1048   K 3.9 05/22/2019 1048   CL 105 05/22/2019 1048   CO2 27 05/22/2019 1048   GLUCOSE 98 05/22/2019 1048   BUN 14 05/22/2019 1048   BUN 18.7 10/17/2016 1353   CREATININE 0.75 05/22/2019 1048   CREATININE 0.8 10/17/2016 1353   CALCIUM 8.9 05/22/2019 1048   PROT 6.6 05/22/2019 1048   ALBUMIN 3.8 05/22/2019 1048   AST 19 05/22/2019 1048   ALT 22 05/22/2019 1048   ALKPHOS 62 05/22/2019 1048   BILITOT 0.8 05/22/2019 1048   GFRNONAA >60 05/22/2019 1048   GFRAA >60 05/22/2019 1048    No results found for: SPEP, UPEP  Lab Results  Component Value Date   WBC 4.3 05/22/2019   NEUTROABS 2.7 05/22/2019   HGB 13.0 05/22/2019   HCT 39.5 05/22/2019   MCV 93.4 05/22/2019   PLT 252 05/22/2019      Chemistry      Component Value Date/Time   NA 141 05/22/2019 1048   K 3.9 05/22/2019 1048   CL 105 05/22/2019 1048   CO2 27 05/22/2019 1048   BUN 14 05/22/2019 1048   BUN 18.7 10/17/2016 1353   CREATININE 0.75 05/22/2019 1048   CREATININE 0.8 10/17/2016 1353      Component Value Date/Time   CALCIUM 8.9 05/22/2019 1048   ALKPHOS 62 05/22/2019 1048   AST 19 05/22/2019 1048   ALT 22 05/22/2019 1048   BILITOT 0.8 05/22/2019 1048       RADIOGRAPHIC STUDIES: I have reviewed multiple imaging studies with the patient I have personally reviewed the radiological images as listed and agreed with the findings in the report. CT CHEST W CONTRAST  Result Date: 05/22/2019 CLINICAL DATA:  Endometrial cancer, follow-up, history of pulmonary metastasis EXAM: CT CHEST, ABDOMEN, AND PELVIS WITH CONTRAST TECHNIQUE: Multidetector CT imaging of the chest, abdomen and pelvis was performed following the standard protocol during bolus administration of intravenous contrast. CONTRAST:  156m  OMNIPAQUE IOHEXOL 300 MG/ML SOLN, additional oral enteric contrast COMPARISON:  11/13/2018, 07/21/2018 FINDINGS: CT CHEST FINDINGS Cardiovascular: Aortic atherosclerosis. Normal heart size. No pericardial effusion. Mediastinum/Nodes: No enlarged mediastinal, hilar, or axillary lymph nodes. Thyroid gland, trachea, and esophagus demonstrate no significant findings. Lungs/Pleura: Unchanged irregular opacities of the peripheral right upper lobe (series 4, image 50). No pleural effusion or pneumothorax. Musculoskeletal: No chest wall mass or suspicious bone lesions identified. CT ABDOMEN PELVIS FINDINGS Hepatobiliary: No solid liver abnormality is seen. Simple cyst of the right lobe of the liver. Hepatic steatosis. No gallstones, gallbladder wall thickening, or biliary dilatation. Pancreas: Unremarkable. No pancreatic ductal dilatation or surrounding inflammatory changes. Spleen: Normal in size without significant abnormality. Adrenals/Urinary Tract: Adrenal glands are unremarkable. Kidneys are normal, without renal calculi, solid lesion, or hydronephrosis. Bladder is unremarkable. Stomach/Bowel: Stomach is within normal limits. Appendix appears normal. No evidence of bowel wall thickening, distention, or inflammatory changes. Sigmoid diverticulosis. Vascular/Lymphatic: Aortic atherosclerosis. No enlarged abdominal or pelvic lymph nodes. Reproductive: Status post hysterectomy. Other: No abdominal wall hernia or abnormality. No abdominopelvic ascites. Musculoskeletal: No acute or significant osseous findings. IMPRESSION: 1. No evidence of recurrent or metastatic disease in the chest, abdomen, or pelvis status post hysterectomy. 2. Previously seen metastatic pulmonary nodules in the bilateral lower lobes remain resolved. 3. Unchanged irregular opacities of the peripheral right upper lobe, likely chronic sequelae of prior infection or inflammation. 4. Hepatic steatosis. 5. Sigmoid diverticulosis. 6. Aortic Atherosclerosis  (ICD10-I70.0). Electronically Signed   By: AEddie CandleM.D.   On: 05/22/2019 14:44   CT ABDOMEN PELVIS W CONTRAST  Result Date: 05/22/2019 CLINICAL DATA:  Endometrial cancer, follow-up, history of pulmonary metastasis EXAM: CT CHEST, ABDOMEN, AND PELVIS WITH CONTRAST TECHNIQUE: Multidetector CT imaging of the chest, abdomen and pelvis was performed following the standard protocol during bolus administration of intravenous contrast. CONTRAST:  1072mOMNIPAQUE IOHEXOL 300 MG/ML SOLN, additional oral enteric contrast COMPARISON:  11/13/2018, 07/21/2018 FINDINGS: CT CHEST FINDINGS Cardiovascular: Aortic atherosclerosis. Normal heart size. No pericardial effusion. Mediastinum/Nodes: No enlarged mediastinal, hilar, or axillary lymph nodes. Thyroid gland, trachea, and esophagus demonstrate no significant findings. Lungs/Pleura: Unchanged irregular opacities of the peripheral right upper lobe (series 4, image 50). No pleural effusion or pneumothorax. Musculoskeletal: No chest wall mass or suspicious bone lesions identified. CT ABDOMEN PELVIS FINDINGS Hepatobiliary: No solid liver abnormality is seen. Simple cyst of the right lobe of the liver. Hepatic steatosis. No gallstones, gallbladder wall thickening, or biliary dilatation. Pancreas: Unremarkable. No pancreatic ductal dilatation or surrounding inflammatory changes. Spleen: Normal in size without significant abnormality. Adrenals/Urinary Tract: Adrenal glands are unremarkable. Kidneys are normal, without renal calculi, solid lesion, or hydronephrosis. Bladder is unremarkable. Stomach/Bowel: Stomach is within normal limits. Appendix appears normal. No evidence of bowel wall thickening, distention, or inflammatory changes. Sigmoid diverticulosis. Vascular/Lymphatic: Aortic atherosclerosis. No enlarged abdominal or pelvic lymph nodes. Reproductive: Status post hysterectomy. Other: No abdominal wall hernia or abnormality. No abdominopelvic ascites. Musculoskeletal: No  acute or significant osseous findings. IMPRESSION: 1. No evidence of recurrent or metastatic disease in the chest, abdomen, or pelvis status post hysterectomy. 2. Previously seen metastatic pulmonary nodules in the bilateral lower lobes remain resolved. 3. Unchanged irregular opacities of the peripheral right upper lobe, likely chronic sequelae of prior infection or  inflammation. 4. Hepatic steatosis. 5. Sigmoid diverticulosis. 6. Aortic Atherosclerosis (ICD10-I70.0). Electronically Signed   By: Eddie Candle M.D.   On: 05/22/2019 14:44

## 2019-05-26 NOTE — Assessment & Plan Note (Signed)
The lung nodules has resolved We will continue close monitoring with CT imaging in 6 months

## 2019-06-22 ENCOUNTER — Ambulatory Visit
Admission: RE | Admit: 2019-06-22 | Discharge: 2019-06-22 | Disposition: A | Discharge status: Home / Self Care | Payer: 59 | Source: Ambulatory Visit | Attending: Internal Medicine | Admitting: Internal Medicine

## 2019-06-22 ENCOUNTER — Other Ambulatory Visit: Payer: Self-pay

## 2019-06-22 DIAGNOSIS — Z1231 Encounter for screening mammogram for malignant neoplasm of breast: Secondary | ICD-10-CM

## 2019-09-10 ENCOUNTER — Ambulatory Visit: Payer: 59 | Admitting: Radiation Oncology

## 2019-10-12 ENCOUNTER — Telehealth: Payer: Self-pay | Admitting: *Deleted

## 2019-10-12 NOTE — Telephone Encounter (Signed)
RETURNED PATIENT'S PHONE CALL, SPOKE WITH PATIENT. ?

## 2019-10-14 ENCOUNTER — Telehealth: Payer: Self-pay | Admitting: *Deleted

## 2019-10-14 ENCOUNTER — Telehealth: Payer: Self-pay

## 2019-10-14 NOTE — Telephone Encounter (Signed)
Patient called and states her vaginal area has been throbbing and burning  for 3 weeks and has been in the bed. She denies vaginal bleeding or discharge. She states it started when she was going to the gym and rode an exercise bike the first day. She states she has not ridden the bike since. She has tried Vagisil and Cortisone 10 and neither has helped. She wants to know is she needs an appointment or what she needs to treat the area. Patient advised will talk with Dr. Sondra Come.

## 2019-10-14 NOTE — Telephone Encounter (Signed)
CALLED PATIENT TO INFORM OF FU WITH DR. KINARD ON 10/15/19 @ 1 PM, LVM FOR A RETURN CALL

## 2019-10-15 ENCOUNTER — Ambulatory Visit
Admission: RE | Admit: 2019-10-15 | Discharge: 2019-10-15 | Disposition: A | Discharge status: Home / Self Care | Payer: Medicare Other | Source: Ambulatory Visit | Attending: Radiation Oncology | Admitting: Radiation Oncology

## 2019-10-15 ENCOUNTER — Other Ambulatory Visit: Payer: Self-pay | Admitting: Radiation Oncology

## 2019-10-15 ENCOUNTER — Other Ambulatory Visit: Payer: Self-pay

## 2019-10-15 ENCOUNTER — Encounter: Payer: Self-pay | Admitting: Radiation Oncology

## 2019-10-15 ENCOUNTER — Telehealth: Payer: Self-pay | Admitting: *Deleted

## 2019-10-15 VITALS — BP 124/65 | HR 72 | Temp 98.4°F | Resp 18 | Ht 60.0 in | Wt 176.6 lb

## 2019-10-15 DIAGNOSIS — C541 Malignant neoplasm of endometrium: Secondary | ICD-10-CM | POA: Insufficient documentation

## 2019-10-15 DIAGNOSIS — C7800 Secondary malignant neoplasm of unspecified lung: Secondary | ICD-10-CM

## 2019-10-15 LAB — CMP (CANCER CENTER ONLY)
ALT: 19 U/L (ref 0–44)
AST: 16 U/L (ref 15–41)
Albumin: 4.1 g/dL (ref 3.5–5.0)
Alkaline Phosphatase: 54 U/L (ref 38–126)
Anion gap: 12 (ref 5–15)
BUN: 12 mg/dL (ref 8–23)
CO2: 28 mmol/L (ref 22–32)
Calcium: 9.6 mg/dL (ref 8.9–10.3)
Chloride: 103 mmol/L (ref 98–111)
Creatinine: 0.75 mg/dL (ref 0.44–1.00)
GFR, Est AFR Am: >60 mL/min (ref >60)
GFR, Estimated: >60 mL/min (ref >60)
Glucose, Bld: 89 mg/dL (ref 70–99)
Potassium: 4 mmol/L (ref 3.5–5.1)
Sodium: 143 mmol/L (ref 135–145)
Total Bilirubin: 0.7 mg/dL (ref 0.3–1.2)
Total Protein: 7.2 g/dL (ref 6.5–8.1)

## 2019-10-15 LAB — CBC WITH DIFFERENTIAL (CANCER CENTER ONLY)
Abs Immature Granulocytes: 0.01 10*3/uL (ref 0.00–0.07)
Basophils Absolute: 0.1 10*3/uL (ref 0.0–0.1)
Basophils Relative: 1 %
Eosinophils Absolute: 0.2 10*3/uL (ref 0.0–0.5)
Eosinophils Relative: 4 %
HCT: 41.9 % (ref 36.0–46.0)
Hemoglobin: 13.9 g/dL (ref 12.0–15.0)
Immature Granulocytes: 0 %
Lymphocytes Relative: 15 %
Lymphs Abs: 0.9 10*3/uL (ref 0.7–4.0)
MCH: 30.5 pg (ref 26.0–34.0)
MCHC: 33.2 g/dL (ref 30.0–36.0)
MCV: 92.1 fL (ref 80.0–100.0)
Monocytes Absolute: 0.6 10*3/uL (ref 0.1–1.0)
Monocytes Relative: 10 %
Neutro Abs: 4.4 10*3/uL (ref 1.7–7.7)
Neutrophils Relative %: 70 %
Platelet Count: 268 10*3/uL (ref 150–400)
RBC: 4.55 MIL/uL (ref 3.87–5.11)
RDW: 13.2 % (ref 11.5–15.5)
WBC Count: 6.2 10*3/uL (ref 4.0–10.5)
nRBC: 0 % (ref 0.0–0.2)

## 2019-10-15 NOTE — Telephone Encounter (Signed)
CALLED PATIENT TO INFORM OF MRI FOR 10-17-19 - ARRIVAL TIME- 7:30 AM @ [facility], PT. TO BE NPO- 4 HRS. PRIOR TO TEST, LVM FOR A RETURN CALL

## 2019-10-15 NOTE — Progress Notes (Signed)
Radiation Oncology         [phone] ________________________________  Name: Ashley Villegas MRN: [id number]  Date: 10/15/2019  DOB: [birth year]  Follow-Up Visit Note  CC: [name], MD  [name], MD    ICD-10-CM   1. 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 Endometrial cancer ([facility])  C54.1     Diagnosis: Recurrent endometrial cancer, original stage IA grade 1, s/p hysterectomy.     Interval Since Last Radiation: Two years, nine months, two weeks, and four days.  11/08/2016 - 12/27/2016: 1. The pelvis was treated to 45 Gy in 25 fractions of 1.8 Gy. 2. The residual pelvic mass was boosted to 14 Gy in 7 fractions of 2 Gy. (Most of recurrence was outside vaginal vault so therefore brachii therapy was not used for a boost)   Narrative:  The patient returns today for routine follow-up. Since her last visit, she underwent a left total knee repair on 04/15/2019 that was performed by Dr. Rhona Raider.  CT scan of chest/abdomen/pelvis on 05/22/2019 did not show any evidence of recurrent or metastatic disease in the chest, abdomen, or pelvis. 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 The irregular opacities of the peripheral right upper lobe were unchanged and were likely chronic sequelae of prior infection or inflammation.  She was last seen by Dr. Alvy Bimler on 05/25/2019, during which time there were no clinical signs of recurrence. Due to being high risk, Dr. Alvy Bimler recommended repeat blood work and CT imaging in six months following that visit.  Of note, the patient underwent a bilateral screening mammogram on 06/22/2019 that did not show any mammographic evidence of malignancy.  On review of systems, she reports painful white sores on the outside of her vagina since early June.  She also  reports pain in the right pelvis region near her pubic bone.  Issues started when she began riding a bicycle for exercise in June she denies vaginal bleeding and changes in bowel or bladder patterns.   ALLERGIES:  is allergic to oxycodone and hydrocodone.  Meds: Current Outpatient Medications  Medication Sig Dispense Refill  . cetirizine (ZYRTEC) 10 MG tablet Take 10 mg by mouth daily.    . Cholecalciferol (VITAMIN D) 50 MCG (2000 UT) CAPS Take 2,000 Units by mouth daily.     Marland Kitchen SYNTHROID 100 MCG tablet Take 100 mcg by mouth daily before breakfast.     . aspirin EC 81 MG tablet Take 1 tablet (81 mg total) by mouth daily. (Patient not taking: Reported on 10/15/2019) 30 tablet 0  . FLUoxetine (PROZAC) 40 MG capsule Take 40 mg by mouth daily.     No current facility-administered medications for this encounter.    Physical Findings: The patient is in no acute distress. Patient is alert and oriented.  height is 5' (1.524 m) and weight is 176 lb 9.6 oz (80.1 kg). Her temperature is 98.4 F (36.9 C). Her blood pressure is 124/65 and her pulse is 72. Her respiration is 18 and oxygen saturation is 98%.  Lungs are clear to auscultation bilaterally. Heart has regular rate and rhythm. No palpable cervical, supraclavicular, or axillary adenopathy. Abdomen soft, non-tender, normal bowel sounds.  On pelvic examination the external genitalia were unremarkable. A speculum exam was performed. There are no mucosal lesions  noted in the vaginal vault. On bimanual  examination there were no pelvic masses appreciated.  The patient was exquisitely tender with palpation along the right vaginal wall but no obvious palpable mass was noted.  Exam was essentially unremarkable but extremely painful for the patient. In light of this gynecologic oncology was consulted Dr. Berline Lopes graciously agreed to examine the patient.  Her exam did not find anything outstanding to explain her pain possibly some swelling towards the right pubic  bone on  vaginal exam. While in the clinic that patient had application of 2% viscous Xylocaine to the area is uncomfortable for her along the right inguinal and vaginal area.  This topical application did not help patient's pain therefore no prescription was given for this. Patient also had a vaginal swab of the area of most discomfort for the patient.  Culture will be obtained will be used to assess for possible yeast infection.  No obvious white discharge to suggest yeast infection however.   Lab Findings: Lab Results  Component Value Date   WBC 6.2 10/15/2019   HGB 13.9 10/15/2019   HCT 41.9 10/15/2019   MCV 92.1 10/15/2019   PLT 268 10/15/2019    Radiographic Findings: No results found.  Impression: H/O  Recurrent endometrial cancer  No evidence of recurrence on clinical exam today or recent scans of the chest, abdomen, and pelvis.  The etiology of the patient's pain complaints are unknown at this time.  After discussion with Dr. Berline Lopes was felt that an MRI of the pelvis would be useful in evaluating the situation.  Plan: The patient is scheduled to follow-up with Dr. Alvy Bimler on 11/27/2019 with CT scans to be done prior to that visit.  An urgent MRI of the pelvis has been ordered and is scheduled for this weekend.  Final details concerning management will depend on results of this MRI.  Total time spent in this encounter was 25 minutes which included reviewing the patient's most recent left TKR, CT scans, follow-ups, mammogram, physical examination, and documentation. ____________________________________   Blair Promise, PhD, MD  This document serves as a record of services personally performed by Gery Pray, MD. It was created on his behalf by Clerance Lav, a trained medical scribe. The creation of this record is based on the scribe's personal observations and the provider's statements to them. This document has been checked and approved by the attending provider.

## 2019-10-15 NOTE — Progress Notes (Addendum)
Patient is here for a f/u visit and to have an area on the outside of her vagina and perineum checked. Pt. States she has had white sores there since early June and is having severe pain. She denies bleeding, bladder or bowel problems. Patient has tried Vagisil and Cortisone 10 and is now taking Zyrtec. Sx are worsening.  BP 124/65 (BP Location: Right Arm, Patient Position: Sitting, Cuff Size: Normal)   Pulse 72   Temp 98.4 F (36.9 C)   Resp 18   Ht 5' (1.524 m)   Wt 176 lb 9.6 oz (80.1 kg)   SpO2 98%   BMI 34.49 kg/m   Wt Readings from Last 3 Encounters:  10/15/19 176 lb 9.6 oz (80.1 kg)  05/25/19 182 lb 12.8 oz (82.9 kg)  04/14/19 180 lb (81.6 kg)   Patient states just had a varicose vein "pop" out this last week in her right leg.

## 2019-10-17 ENCOUNTER — Other Ambulatory Visit: Payer: Self-pay

## 2019-10-17 ENCOUNTER — Ambulatory Visit (HOSPITAL_COMMUNITY)
Admission: RE | Admit: 2019-10-17 | Discharge: 2019-10-17 | Disposition: A | Discharge status: Home / Self Care | Payer: Medicare Other | Source: Ambulatory Visit | Attending: Radiation Oncology | Admitting: Radiation Oncology

## 2019-10-17 DIAGNOSIS — C541 Malignant neoplasm of endometrium: Secondary | ICD-10-CM | POA: Diagnosis present

## 2019-10-17 MED ORDER — GADOBUTROL 1 MMOL/ML IV SOLN
8.0000 mL | Freq: Once | INTRAVENOUS | Status: AC | PRN
  Administered 2019-10-17: 8 mL via INTRAVENOUS

## 2019-10-19 ENCOUNTER — Ambulatory Visit: Payer: Self-pay | Admitting: Radiation Oncology

## 2019-10-20 ENCOUNTER — Other Ambulatory Visit: Payer: Self-pay | Admitting: Hematology and Oncology

## 2019-10-20 ENCOUNTER — Telehealth: Payer: Self-pay | Admitting: Oncology

## 2019-10-20 DIAGNOSIS — C7800 Secondary malignant neoplasm of unspecified lung: Secondary | ICD-10-CM

## 2019-10-20 DIAGNOSIS — C541 Malignant neoplasm of endometrium: Secondary | ICD-10-CM

## 2019-10-20 LAB — AEROBIC/ANAEROBIC CULTURE W GRAM STAIN (SURGICAL/DEEP WOUND): Special Requests: NORMAL

## 2019-10-20 NOTE — Telephone Encounter (Signed)
Ashley Villegas with appointment for CT Chest on 8/9 at 2:30 (2:15 arrival, liquids only 4 hours prior).  Also advised her that a scheduler will call her for an appointment with Dr. Alvy Bimler on 8/10 to discuss the results.    She asked what the plan will be for treatment.  Advised her that I will find out what the tentative plan is and call her back.

## 2019-10-21 ENCOUNTER — Telehealth: Payer: Self-pay | Admitting: Hematology and Oncology

## 2019-10-21 NOTE — Telephone Encounter (Signed)
Scheduled per 7/27 sch msg. Called and spoke with pt, confirmed 8/10 appt

## 2019-11-02 ENCOUNTER — Encounter (HOSPITAL_COMMUNITY): Payer: Self-pay

## 2019-11-02 ENCOUNTER — Other Ambulatory Visit: Payer: Self-pay

## 2019-11-02 ENCOUNTER — Other Ambulatory Visit: Payer: Self-pay | Admitting: Oncology

## 2019-11-02 ENCOUNTER — Ambulatory Visit (HOSPITAL_COMMUNITY)
Admission: RE | Admit: 2019-11-02 | Discharge: 2019-11-02 | Disposition: A | Discharge status: Home / Self Care | Payer: Medicare Other | Source: Ambulatory Visit | Attending: Hematology and Oncology | Admitting: Hematology and Oncology

## 2019-11-02 DIAGNOSIS — C7800 Secondary malignant neoplasm of unspecified lung: Secondary | ICD-10-CM | POA: Insufficient documentation

## 2019-11-02 DIAGNOSIS — C541 Malignant neoplasm of endometrium: Secondary | ICD-10-CM | POA: Diagnosis not present

## 2019-11-02 MED ORDER — SODIUM CHLORIDE (PF) 0.9 % IJ SOLN
INTRAMUSCULAR | Status: AC
  Filled 2019-11-02: qty 50

## 2019-11-02 MED ORDER — IOHEXOL 300 MG/ML  SOLN
75.0000 mL | Freq: Once | INTRAMUSCULAR | Status: AC | PRN
  Administered 2019-11-02: 75 mL via INTRAVENOUS

## 2019-11-02 NOTE — Progress Notes (Signed)
Gynecologic Oncology Multi-Disciplinary Disposition Conference Note  Date of the Conference: 11/02/2019  Patient Name: Ashley Villegas  Referring Provider: Dr. Nelda Marseille Primary GYN Oncologist: Dr. Berline Lopes  Stage/Disposition: Recurrent stage !A, grade 1 endometrial cancer. Disposition is for a CT chest and if no signs of metastases, then recommendation is for external beam radiation. Consideration for chemotherapy with carboplatin/Taxol with future recurrences.  This Multidisciplinary conference took place involving physicians from [place], [place], Radiation Oncology, Pathology, Radiology along with the Gynecologic Oncology Nurse Practitioner and RN.  Comprehensive assessment of the patient's malignancy, staging, need for surgery, chemotherapy, radiation therapy, and need for further testing were reviewed. Supportive measures, both inpatient and following discharge were also discussed. The recommended plan of care is documented. Greater than 35 minutes were spent correlating and coordinating this patient's care.

## 2019-11-03 ENCOUNTER — Other Ambulatory Visit: Payer: Self-pay

## 2019-11-03 ENCOUNTER — Inpatient Hospital Stay: Payer: Medicare Other | Attending: Hematology and Oncology | Admitting: Hematology and Oncology

## 2019-11-03 ENCOUNTER — Encounter: Payer: Self-pay | Admitting: Hematology and Oncology

## 2019-11-03 VITALS — BP 132/61 | HR 72 | Temp 98.0°F | Resp 18 | Ht 60.0 in | Wt 174.2 lb

## 2019-11-03 DIAGNOSIS — Z79899 Other long term (current) drug therapy: Secondary | ICD-10-CM | POA: Diagnosis not present

## 2019-11-03 DIAGNOSIS — C541 Malignant neoplasm of endometrium: Secondary | ICD-10-CM | POA: Insufficient documentation

## 2019-11-03 DIAGNOSIS — G893 Neoplasm related pain (acute) (chronic): Secondary | ICD-10-CM | POA: Diagnosis not present

## 2019-11-03 MED ORDER — HYDROMORPHONE HCL 2 MG PO TABS: 2.0000 mg | ORAL_TABLET | ORAL | 0 refills | Status: DC | PRN

## 2019-11-03 MED FILL — HYDROmorphone HCL 2 MG TABS: 2 | 5 days supply | Qty: 30 | Fill #0

## 2019-11-03 NOTE — Assessment & Plan Note (Addendum)
She has severe, cancer associated pain due to the location of the lesion near her urethra I recommend prescription hydromorphone to take as needed along with Tylenol I warned her about risk of nausea, sedation and constipation with side effects of therapy I will call her in a few days to assess pain control She is in agreement to try

## 2019-11-03 NOTE — Assessment & Plan Note (Signed)
I have reviewed her CT imaging of the chest which show no evidence of distant metastatic disease Her case was discussed at the tumor board yesterday In the absence of metastatic disease, we are in favor of her to proceed with localized radiation therapy The patient is not a surgical candidate given history of metastatic stage IV disease After radiation treatment is completed, I will wait approximately 3 months before repeating another imaging study to assess whether she have residual disease If she have residual disease, I will prescribe systemic treatment If she has complete response to therapy, I will observe only She is in agreement to proceed We will set up appointment for her to see radiation oncology for further follow-up and treatment

## 2019-11-03 NOTE — Progress Notes (Signed)
[place] OFFICE PROGRESS NOTE  Patient Care Team: [name], MD as PCP - General (Internal Medicine) Jacelyn Pi, MD as Consulting Physician (Endocrinology) Heath Lark, MD as Consulting Physician (Hematology and Oncology)  ASSESSMENT & PLAN:  Endometrial cancer [facility]) I have reviewed her CT imaging of the chest which show no evidence of distant metastatic disease Her case was discussed at the tumor board yesterday In the absence of metastatic disease, we are in favor of her to proceed with localized radiation therapy The patient is not a surgical candidate given history of metastatic stage IV disease After radiation treatment is completed, I will wait approximately 3 months before repeating another imaging study to assess whether she have residual disease If she have residual disease, I will prescribe systemic treatment If she has complete response to therapy, I will observe only She is in agreement to proceed We will set up appointment for her to see radiation oncology for further follow-up and treatment  Cancer associated pain She has severe, cancer associated pain due to the location of the lesion near her urethra I recommend prescription hydromorphone to take as needed along with Tylenol I warned her about risk of nausea, sedation and constipation with side effects of therapy I will call her in a few days to assess pain control She is in agreement to try   No orders of the defined types were placed in this encounter.   All questions were answered. The patient knows to call the clinic with any problems, questions or concerns. The total time spent in the appointment was 30 minutes encounter with patients including review of chart and various tests results, discussions about plan of care and coordination of care plan   Heath Lark, MD 11/03/2019 12:41 PM  INTERVAL HISTORY: Please see below for problem oriented charting. She returns for further follow-up  and discussion about plan of care Since last time she was seen, she developed severe pelvic pain MRI show pelvic lesion CT scan of the chest show no evidence of distant metastatic disease Her case was presented at the GYN oncology tumor board yesterday and the consensus would be to proceed with radiation therapy to the pelvis with localized disease recurrence She is complaining of severe pain especially when she tries to sit She is taking acetaminophen only and this is not helping She have lost a bit of weight No recent nausea or constipation  SUMMARY OF ONCOLOGIC HISTORY: Oncology History Overview Note  Abnormal MSI, germline mutation was negative Endometrioid   Endometrial cancer ([place])  09/15/2014 Pathology Results   Endometrium, curettage - ENDOMETRIAL ADENOCARCINOMA. - SEE COMMENT. Microscopic Comment Although definitive characterization is best performed on resection specimen, as sampled, the endometrial adenocarcinoma appears to be endometrioid subtype, FIGO grade 1.    09/15/2014 Surgery   PreOp: postmenopausal bleeding, cervical stenosis PostOp: same and uterine polyp Procedure:  Hysteroscopy, Dilation and Curettage, Myosure polypectomy Surgeon: Dr. Janyth Pupa  Findings:8cm uterus with thickened polypoid-like endometrium, large 2cm irregular appearing polyp  Specimens: 1) endometrial curettings   10/26/2014 Pathology Results   1. Uterus +/- tubes/ovaries, neoplastic - INVASIVE ADENOCARCINOMA, ENDOMETRIOID TYPE, SEE COMMENT. - TUMOR INVOLVES LESS THAN ONE HALF MYOMETRIAL THICKNESS. - TUMOR INVOLVES UTERINE ADENOMYOSIS. - BENIGN LEIOMYOMATA (UP TO 2.5 CM). - BENIGN CERVICAL MUCOSA; NEGATIVE FOR INTRAEPITHELIAL LESION OR MALIGNANCY. - BENIGN RIGHT AND LEFT OVARIES; NEGATIVE FOR ATYPIA OR MALIGNANCY. - BENIGN RIGHT AND LEFT FALLOPIAN TUBE; NEGATIVE FOR ATYPIA OR MALIGNANCY. - BENIGN PARATUBAL CYSTS; NEGATIVE FOR ATYPIA  OR MALIGNANCY. - SEE TUMOR SYNOPTIC TEMPLATE  BELOW. 2. Lymph nodes, regional resection, right pelvic - FOUR LYMPH NODES, NEGATIVE FOR TUMOR (0/4). 3. Lymph nodes, regional resection, left pelvic - FOUR LYMPH NODES, NEGATIVE FOR TUMOR (0/4). Microscopic Comment 1. ONCOLOGY TABLE-UTERUS, CARCINOMA OR CARCINOSARCOMA Specimen: Uterus and bilateral fallopian tubes and ovaries. Procedure: Hysterectomy and bilateral salpingo-oophorectomy. Lymph node sampling performed: Yes. Specimen integrity: Intact. Maximum tumor size: 4.0 cm (tumor involved entire endometrium) Histologic type: Adenocarcinoma, endometrioid type. Grade: 1 Myometrial invasion: 0.5 cm where myometrium is 1.5 cm in thickness Cervical stromal involvement: Absent. Extent of involvement of other organs: None Lymph - vascular invasion: Absent. Peritoneal washings: N/A Lymph nodes: # examined - 8 ; # positive - 0 Pelvic lymph nodes: N/A involved of N/A lymph nodes. Para-aortic lymph nodes: N/A involved of N/A lymph nodes. Other (specify involvement and site): N/A TNM code: pT1a, pN0 FIGO Stage (based on pathologic findings, needs clinical correlation): N/A Comments: None MSI testing abnormal   10/26/2014 Surgery   Surgery: Total robotic hysterectomy, bilateral salpingo-oophorectomy, bilateral pelvic lymph node dissection.   Surgeons:  Lucita Lora. Alycia Rossetti, MD; Lahoma Crocker, MD   Pathology: Uterus, cervix, bilateral tubes and ovaries, bilateral pelvic lymph nodes to pathology.   Operative findings: Omentum adherent to midline vertical incision. Fibroid uterus. Normal adnexa. Adhesions of anterior bladder to uterus. Frozen section with grade 1 cancer focally involving adenomyosis, minimal myometrial invasion.   11/17/2014 Imaging   Ct abdomen 1. Status post hysterectomy. Pelvic edema which is greater than typically seen 3 weeks postop. Suspicious for postoperative infection. 2. Bladder wall thickening and irregularity. Although this could be partially due to  underdistention, Concurrent cystitis cannot be excluded. 3. Bilateral fluid density lesions along the pelvic sidewalls. Favored to represent seromas or lymphangiomas. No specific features to suggest abscess. If the patient's symptoms persist after appropriate antibiotic therapy, aspiration of the largest left pelvic sidewall "Lesion" should be considered. 4. Suspicion of mild hepatic steatosis. Indeterminate right hepatic lobe lesion. If definitive characterization is desired in this patient with history of primary malignancy, nonemergent pre and post contrast abdominal MRI could be performed. 5. Hiatal hernia.   11/25/2014 Imaging   1. Persistent bilateral pelvic sidewall fluid collections, left greater than right, with left-sided inflammatory changes. 2. Technically successful 12 French left pelvic abscess drain catheter placement. A sample of the aspirate was sent for Gram stain, culture and sensitivity.   12/08/2014 Imaging   CT abdomen and pelvis 1. Complete resolution of dominant fluid collection within the left hemipelvis following percutaneous drainage catheter placement. This percutaneous drainage catheter was subsequent removed intact at the patient's bedside. 2. Continued decrease in size of bilobed fluid collections within the right hemipelvis with superficial component measuring 1.6 cm and posterior component measuring 2.6 cm, indeterminate though both favored to represent evolving seromas. 3. Unchanged indeterminate approximately 1.2 cm hepatic lesion. Further evaluation with contrast-enhanced abdominal MRI could be performed as clinically indicated. 4. Colonic diverticulosis without evidence of diverticulitis.   10/19/2016 Imaging   New 2.6 cm soft tissue mass involving right vaginal cuff, consistent with locally recurrent carcinoma.  No other sites of metastatic disease identified.  Colonic diverticulosis, without radiographic evidence of diverticulitis.  Mild hepatic  steatosis.    10/24/2016 Relapse/Recurrence   Vaginal cuff recurrence. No evidence of distant disease.   10/24/2016 Pathology Results   Vagina, biopsy, right apex - ADENOCARCINOMA - SEE COMMENT Microscopic Comment The morphologic features are consistent with the patient's previously diagnosed endometrioid adenocarcinoma  11/08/2016 - 12/27/2016 Radiation Therapy   45 Gy in 25 fractions of 1.8 Gy. The residual pelvic mass was boosted to 14 Gy in 7 fractions of 2 Gy. No intracavitary lesion seen at completion of pelvic fields to boost with brachytherapy.   06/12/2017 Imaging   CT abdomen and pelvis 1. New 1.1 by 0.8 cm right lower lobe pulmonary nodule, concerning for pulmonary metastatic disease. Possibilities for further assessment include biopsy or nuclear medicine PET-CT to assess this lesion and the rest of the neck/chest/abdomen/pelvis for other potential hypermetabolic lesions. 2. The previous soft tissue mass along the right vaginal cuff is no longer present. 3. Stable and likely benign right hepatic lobe hypodense lesion. 4. Other imaging findings of potential clinical significance: Sigmoid colon diverticulosis. Aortic Atherosclerosis (ICD10-I70.0). Multilevel lumbar degenerative disc disease. Mild cardiomegaly. Small type 1 hiatal hernia.   07/05/2017 Imaging   Ct chest Several bilateral pulmonary nodules measuring up to 11 mm, highly suspicious for pulmonary metastases.   No evidence of lymphadenopathy or pleural effusion.  Aortic Atherosclerosis (ICD10-I70.0).   07/25/2017 Imaging   Status post CT-guided lung nodule biopsy. Tissue specimen sent to pathology for complete histopathologic analysis.   07/25/2017 Pathology Results   Lung, needle/core biopsy(ies), RLL - METASTATIC ADENOCARCINOMA - SEE COMMENT Microscopic Comment By immunohistochemistry, the neoplastic cells are positive for Pax-8 and ER but negative for TTF-1. Overall, this immunoprofile is consistent with  metastasis from the patient's known endometrioid adenocarcinoma   07/30/2017 Cancer Staging   Staging form: Corpus Uteri - Carcinoma and Carcinosarcoma, AJCC 8th Edition - Clinical: Stage IVB (rcT1, cN0, pM1) - Signed by Heath Lark, MD on 07/30/2017   08/09/2017 - 11/21/2017 Chemotherapy   The patient had pembrolizumab for chemotherapy treatment.    10/10/2017 Imaging   1. Interval decrease in size and contour. The of previously described right and left lower lobe pulmonary nodules. 2. Interval increase in size of mediastinal and hilar adenopathy, potentially metastatic in etiology. 3. Aortic Atherosclerosis (ICD10-I70.0).   11/08/2017 - 11/10/2017 Hospital Admission   She was admitted to the hospital due to uncontrolled pain   11/29/2017 Imaging   Chest Impression:  No thoracic metastasis. No discrete pulmonary nodules identified.  Abdomen / Pelvis Impression:  No evidence of local recurrence or metastatic endometrial carcinoma in the abdomen pelvis.   02/21/2018 Imaging   1. No findings to suggest metastatic disease in the chest, abdomen or pelvis. 2. Mild colonic diverticulosis without evidence of acute diverticulitis at this time. 3. Aortic atherosclerosis. 4. Additional incidental findings, as above.   03/10/2018 Imaging   Bone scan: No scintigraphic evidence of osseous metastatic disease.   05/22/2018 Imaging   1. Stable exam. No evidence of recurrent or metastatic carcinoma within the chest, abdomen, or pelvis. 2. Colonic diverticulosis, without radiographic evidence of diverticulitis.   11/13/2018 Imaging   1. No evidence of recurrent or metastatic carcinoma within the chest, abdomen, or pelvis. 2. Colonic diverticulosis, without radiographic evidence of diverticulitis. 3. Stable hepatic steatosis.   Aortic Atherosclerosis (ICD10-I70.0).   05/22/2019 Imaging   1. No evidence of recurrent or metastatic disease in the chest, abdomen, or pelvis status post hysterectomy. 2.  Previously seen metastatic pulmonary nodules in the bilateral lower lobes remain resolved. 3. Unchanged irregular opacities of the peripheral right upper lobe, likely chronic sequelae of prior infection or inflammation. 4. Hepatic steatosis. 5. Sigmoid diverticulosis. 6. Aortic Atherosclerosis (ICD10-I70.0).     10/18/2019 Imaging   1. Heterogeneously enhancing lesion measuring 2.4 x 1.9 x 1.9  cm immediately to the right of the urethra along the anterolateral aspect of the vaginal wall, likely centered in the right levator ani 9 musculature. This has a malignant appearance, but whether this represents a new primary lesion or a metastatic lesion is uncertain on today's examination. 2. Mild colonic diverticulosis.     11/02/2019 Imaging   CT chest 1. No evidence of metastatic disease. 2. Hepatic steatosis. 3.  Aortic atherosclerosis (ICD10-I70.0).   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    Metastasis to lung ([place])  07/30/2017 Initial Diagnosis   Metastasis to lung ([place])   08/09/2017 - 11/21/2017 Chemotherapy   The patient had pembrolizumab (KEYTRUDA) 200 mg in sodium chloride 0.9 % 50 mL chemo infusion, 200 mg, Intravenous, Once, 5 of 7 cycles Administration: 200 mg (08/09/2017), 200 mg (08/30/2017), 200 mg (09/20/2017), 200 mg (10/11/2017), 200 mg (11/01/2017)  for chemotherapy treatment.      REVIEW OF SYSTEMS:   Constitutional: Denies fevers, chills or abnormal weight loss Eyes: Denies blurriness of vision Ears, nose, mouth, throat, and face: Denies mucositis or sore throat Respiratory: Denies cough, dyspnea  or wheezes Cardiovascular: Denies palpitation, chest discomfort or lower extremity swelling Gastrointestinal:  Denies nausea, heartburn or change in bowel habits Skin: Denies abnormal skin rashes Lymphatics: Denies new lymphadenopathy or easy bruising Neurological:Denies numbness, tingling or new weaknesses Behavioral/Psych: Mood is stable, no new changes  All other systems were reviewed with the patient and are negative.  I have reviewed the past medical history, past surgical history, social history and family history with the patient and they are unchanged from previous note.  ALLERGIES:  is allergic to oxycodone and hydrocodone.  MEDICATIONS:  Current Outpatient Medications  Medication Sig Dispense Refill  . aspirin EC 81 MG tablet Take 1 tablet (81 mg total) by mouth daily. (Patient not taking: Reported on 10/15/2019) 30 tablet 0  . cetirizine (ZYRTEC) 10 MG tablet Take 10 mg by mouth daily.    . Cholecalciferol (VITAMIN D) 50 MCG (2000 UT) CAPS Take 2,000 Units by mouth daily.     Marland Kitchen FLUoxetine (PROZAC) 40 MG capsule Take 40 mg by mouth daily.    Marland Kitchen HYDROmorphone (DILAUDID) 2 MG tablet Take 1 tablet (2 mg total) by mouth every 4 (four) hours as needed for severe pain. 30 tablet 0  . SYNTHROID 100 MCG tablet Take 100 mcg by mouth daily before breakfast.      No current facility-administered medications for this visit.    PHYSICAL EXAMINATION: ECOG PERFORMANCE STATUS: 1 - Symptomatic but completely ambulatory  Vitals:   11/03/19 1228  BP: 132/61  Pulse: 72  Resp: 18  Temp: 98 F (36.7 C)  SpO2: 98%   Filed Weights   11/03/19 1228  Weight: 174 lb 3.2 oz (79 kg)    GENERAL:alert, no distress and comfortable NEURO: alert & oriented x 3 with fluent speech, no focal motor/sensory deficits  LABORATORY DATA:  I have reviewed the data as listed    Component Value Date/Time   NA 143 10/15/2019 1507   K 4.0 10/15/2019 1507   CL 103 10/15/2019 1507   CO2 28 10/15/2019 1507  GLUCOSE 89 10/15/2019 1507   BUN 12 10/15/2019 1507   BUN 18.7 10/17/2016 1353   CREATININE 0.75 10/15/2019 1507   CREATININE 0.8 10/17/2016 1353   CALCIUM 9.6 10/15/2019 1507   PROT 7.2 10/15/2019 1507   ALBUMIN 4.1 10/15/2019 1507   AST 16 10/15/2019 1507   ALT 19 10/15/2019 1507   ALKPHOS 54 10/15/2019 1507   BILITOT 0.7 10/15/2019 1507   GFRNONAA >60 10/15/2019 1507   GFRAA >60 10/15/2019 1507    No results found for: SPEP, UPEP  Lab Results  Component Value Date   WBC 6.2 10/15/2019   NEUTROABS 4.4 10/15/2019   HGB 13.9 10/15/2019   HCT 41.9 10/15/2019   MCV 92.1 10/15/2019   PLT 268 10/15/2019      Chemistry      Component Value Date/Time   NA 143 10/15/2019 1507   K 4.0 10/15/2019 1507   CL 103 10/15/2019 1507   CO2 28 10/15/2019 1507   BUN 12 10/15/2019 1507   BUN 18.7 10/17/2016 1353   CREATININE 0.75 10/15/2019 1507   CREATININE 0.8 10/17/2016 1353      Component Value Date/Time   CALCIUM 9.6 10/15/2019 1507   ALKPHOS 54 10/15/2019 1507   AST 16 10/15/2019 1507   ALT 19 10/15/2019 1507   BILITOT 0.7 10/15/2019 1507       RADIOGRAPHIC STUDIES: I have personally reviewed the radiological images as listed and agreed with the findings in the report. CT CHEST W CONTRAST  Result Date: 11/02/2019 CLINICAL DATA:  Uterine/cervical cancer.  Lung metastases. EXAM: CT CHEST WITH CONTRAST TECHNIQUE: Multidetector CT imaging of the chest was performed during intravenous contrast administration. CONTRAST:  75m OMNIPAQUE IOHEXOL 300 MG/ML  SOLN COMPARISON:  05/22/2019. FINDINGS: Cardiovascular: Atherosclerotic calcification of the aorta. Heart is enlarged. No pericardial effusion. Mediastinum/Nodes: No pathologically enlarged mediastinal, hilar or axillary lymph nodes. Esophagus is unremarkable. Lungs/Pleura: Scattered areas of peribronchovascular scarring bilaterally. No suspicious pulmonary nodules. No pleural fluid. Airway is unremarkable. Upper Abdomen: Liver is  slightly decreased in attenuation diffusely. 1.3 cm low-attenuation lesion in the right hepatic lobe is unchanged but difficult to further characterize due to size. Visualized portions of the liver, gallbladder, adrenal glands, kidneys, spleen, pancreas, stomach and bowel are otherwise unremarkable. No upper abdominal adenopathy. Musculoskeletal: Degenerative changes in the spine. No worrisome lytic or sclerotic lesions. IMPRESSION: 1. No evidence of metastatic disease. 2. Hepatic steatosis. 3.  Aortic atherosclerosis (ICD10-I70.0). Electronically Signed   By: MLorin PicketM.D.   On: 11/02/2019 16:01   MR Pelvis W Wo Contrast  Result Date: 10/18/2019 CLINICAL DATA:  69year old female with history of endometrial cancer. Right-sided pelvic pain. Evaluate for metastatic disease or infection. EXAM: MRI PELVIS WITHOUT AND WITH CONTRAST TECHNIQUE: Multiplanar multisequence MR imaging of the pelvis was performed both before and after administration of intravenous contrast. CONTRAST:  866mGADAVIST GADOBUTROL 1 MMOL/ML IV SOLN COMPARISON:  No prior pelvic MRI. CT of the chest, abdomen and pelvis 05/22/2019. FINDINGS: Urinary Tract:  No abnormality visualized. Bowel: Occasional colonic diverticulae are noted without surrounding inflammatory changes to suggest an acute diverticulitis. Otherwise, unremarkable. Vascular/Lymphatic: No pathologically enlarged lymph nodes. No significant vascular abnormality seen. Reproductive: Status post hysterectomy. Ovaries are also apparently surgically absent. Along the right-side of the introitus adjacent to the anterolateral aspect of the vagina, immediately to the right of the urethra there is a well-defined lesion (axial image 29 of series 5 and sagittal image 17 of series 6) measuring 2.4 x 1.9 x  1.9 cm which is T1 isointense to muscle, T2 hyperintense and demonstrates heterogeneous internal enhancement on post gadolinium images with relatively well-defined enhancing capsule and  some enhancement extending into the surrounding soft tissues. This lesion appears likely centered in the inferior aspect of the right levator ani musculature. Other: No significant volume of ascites noted in the visualized portions of the peritoneal cavity. Musculoskeletal: No suspicious bone lesions identified. IMPRESSION: 1. Heterogeneously enhancing lesion measuring 2.4 x 1.9 x 1.9 cm immediately to the right of the urethra along the anterolateral aspect of the vaginal wall, likely centered in the right levator ani 9 musculature. This has a malignant appearance, but whether this represents a new primary lesion or a metastatic lesion is uncertain on today's examination. 2. Mild colonic diverticulosis. Electronically Signed   By: Vinnie Langton M.D.   On: 10/18/2019 14:02

## 2019-11-04 ENCOUNTER — Other Ambulatory Visit: Payer: Self-pay

## 2019-11-04 ENCOUNTER — Ambulatory Visit
Admission: RE | Admit: 2019-11-04 | Discharge: 2019-11-04 | Disposition: A | Discharge status: Home / Self Care | Payer: Medicare Other | Source: Ambulatory Visit | Attending: Radiation Oncology | Admitting: Radiation Oncology

## 2019-11-04 DIAGNOSIS — C541 Malignant neoplasm of endometrium: Secondary | ICD-10-CM

## 2019-11-04 DIAGNOSIS — Z510 Encounter for antineoplastic radiation therapy: Secondary | ICD-10-CM | POA: Insufficient documentation

## 2019-11-04 NOTE — Addendum Note (Signed)
Encounter addended by: De Burrs, RN on: 11/04/2019 5:25 PM  Actions taken: Clinical Note Signed

## 2019-11-04 NOTE — Progress Notes (Signed)
Radiation Oncology         [phone] ________________________________  Name: Ashley Villegas MRN: [id number]  Date: 11/04/2019  DOB: Sep 17, 1950  Re-Evaluation Note  CC: [name], MD  Heath Lark, MD    ICD-10-CM   1. Endometrial cancer ([place])  C54.1     Diagnosis: Recurrent endometrial cancer, original stage IA grade 1, s/p hysterectomy now with enhancing lesion centered along the right levator ani muscle  Narrative:  The patient returns today to discuss radiation treatment options. She was initially seen in consultation on 10/29/2016, during which time she had an isolated recurrence of her endometrial cancer in the right vaginal cuff region, primarily located outside the vaginal vault. She was considered a good candidate for a definitive course of radiation therapy. Thus, she was treated with 45 Gy in 25 fractions directed at the pelvis followed by external beam boost of 14 Gy in 7 fractions directed at the site of the recurrence in the right upper vaginal area from 11/08/2016 - 12/27/2016.   Since the end of treatment, she has followed-up with radiation oncology on several occasions. On her 07/14/2018 follow-up, she was noted to have developed an additional recurrence in the lung for which she was treated with immunotherapy and was in remission. Of note, it was discontinued prior to completion secondary to side effects. CT scans of chest, abdomen, and pelvis were repeated approximately every six months for evaluation and did not show any evidence of recurrence.  She was last seen in follow-up with me on 10/15/2019. At that time, Dr. Berline Lopes had ordered an urgent pelvic MRI in light of her continued pelvic pain. MRI of pelvis was completed on 10/17/2019 and showed a heterogeneously enhancing lesion that measured 2.4 x 1.9 x 1.9 cm immediately to the right of the urethra along the anterolateral aspect of the vaginal wall, likely centered in the right levator ani  musculature. It  was noted to be malignant in appearance but it was uncertain as to whether it represented a new primary lesion or a metastatic lesion.  CT scan of chest on 11/02/2019 showed hepatic steatosis and aortic atherosclerosis without evidence of metastatic disease.  She was last seen by Dr. Alvy Bimler on 11/03/2019. Her case was discussed at the tumor board and given the absence of metastatic disease, it was recommended that she proceed with localized radiation therapy. She is not a surgical candidate given her history of metastatic stage IV disease. Imaging is to be repeated approximately three months after the completion of radiation therapy. If there is residual disease, Dr. Alvy Bimler will prescribe systemic treatment. If she has a complete response to therapy, then she will remain under observation.  On review of systems, the patient reports ongoing pain in the distal right pelvis region. She denies vaginal bleeding or discharge and any other symptoms.  She is very uncomfortable when sitting in light of this area   Allergies:  is allergic to oxycodone and hydrocodone.  Meds: Current Outpatient Medications  Medication Sig Dispense Refill  . Cholecalciferol (VITAMIN D) 50 MCG (2000 UT) CAPS Take 2,000 Units by mouth daily.     Marland Kitchen FLUoxetine (PROZAC) 40 MG capsule Take 40 mg by mouth daily.    Marland Kitchen HYDROmorphone (DILAUDID) 2 MG tablet Take 1 tablet (2 mg total) by mouth every 4 (four) hours as needed for severe pain. 30 tablet 0  . SYNTHROID 100 MCG tablet Take 100 mcg by mouth daily before breakfast.      No current facility-administered  medications for this encounter.    Physical Findings: The patient is in no acute distress. Patient is alert and oriented. Pelvic exam not repeated today  Lab Findings: Lab Results  Component Value Date   WBC 6.2 10/15/2019   HGB 13.9 10/15/2019   HCT 41.9 10/15/2019   MCV 92.1 10/15/2019   PLT 268 10/15/2019    Radiographic Findings: CT CHEST W  CONTRAST  Result Date: 11/02/2019 CLINICAL DATA:  Uterine/cervical cancer.  Lung metastases. EXAM: CT CHEST WITH CONTRAST TECHNIQUE: Multidetector CT imaging of the chest was performed during intravenous contrast administration. CONTRAST:  16mL OMNIPAQUE IOHEXOL 300 MG/ML  SOLN COMPARISON:  05/22/2019. FINDINGS: Cardiovascular: Atherosclerotic calcification of the aorta. Heart is enlarged. No pericardial effusion. Mediastinum/Nodes: No pathologically enlarged mediastinal, hilar or axillary lymph nodes. Esophagus is unremarkable. Lungs/Pleura: Scattered areas of peribronchovascular scarring bilaterally. No suspicious pulmonary nodules. No pleural fluid. Airway is unremarkable. Upper Abdomen: Liver is slightly decreased in attenuation diffusely. 1.3 cm low-attenuation lesion in the right hepatic lobe is unchanged but difficult to further characterize due to size. Visualized portions of the liver, gallbladder, adrenal glands, kidneys, spleen, pancreas, stomach and bowel are otherwise unremarkable. No upper abdominal adenopathy. Musculoskeletal: Degenerative changes in the spine. No worrisome lytic or sclerotic lesions. IMPRESSION: 1. No evidence of metastatic disease. 2. Hepatic steatosis. 3.  Aortic atherosclerosis (ICD10-I70.0). Electronically Signed   By: Lorin Picket M.D.   On: 11/02/2019 16:01   MR Pelvis W Wo Contrast  Result Date: 10/18/2019 CLINICAL DATA:  69 year old female with history of endometrial cancer. Right-sided pelvic pain. Evaluate for metastatic disease or infection. EXAM: MRI PELVIS WITHOUT AND WITH CONTRAST TECHNIQUE: Multiplanar multisequence MR imaging of the pelvis was performed both before and after administration of intravenous contrast. CONTRAST:  66mL GADAVIST GADOBUTROL 1 MMOL/ML IV SOLN COMPARISON:  No prior pelvic MRI. CT of the chest, abdomen and pelvis 05/22/2019. FINDINGS: Urinary Tract:  No abnormality visualized. Bowel: Occasional colonic diverticulae are noted without  surrounding inflammatory changes to suggest an acute diverticulitis. Otherwise, unremarkable. Vascular/Lymphatic: No pathologically enlarged lymph nodes. No significant vascular abnormality seen. Reproductive: Status post hysterectomy. Ovaries are also apparently surgically absent. Along the right-side of the introitus adjacent to the anterolateral aspect of the vagina, immediately to the right of the urethra there is a well-defined lesion (axial image 29 of series 5 and sagittal image 17 of series 6) measuring 2.4 x 1.9 x 1.9 cm which is T1 isointense to muscle, T2 hyperintense and demonstrates heterogeneous internal enhancement on post gadolinium images with relatively well-defined enhancing capsule and some enhancement extending into the surrounding soft tissues. This lesion appears likely centered in the inferior aspect of the right levator ani musculature. Other: No significant volume of ascites noted in the visualized portions of the peritoneal cavity. Musculoskeletal: No suspicious bone lesions identified. IMPRESSION: 1. Heterogeneously enhancing lesion measuring 2.4 x 1.9 x 1.9 cm immediately to the right of the urethra along the anterolateral aspect of the vaginal wall, likely centered in the right levator ani 9 musculature. This has a malignant appearance, but whether this represents a new primary lesion or a metastatic lesion is uncertain on today's examination. 2. Mild colonic diverticulosis. Electronically Signed   By: Vinnie Langton M.D.   On: 10/18/2019 14:02    Impression: Recurrent endometrial cancer, original stage IA grade 1, s/p hysterectomy now with enhancing lesion centered along the right levator ani muscle.   Patient would be a good candidate for palliative radiation therapy directed at this painful mass.  This appears to be for the most part inferior to her previous pelvic radiation fields.  Anticipated radiation fields will be compared to her previous pelvic radiation field to  account for potential overlap in this region.    I discussed the course of treatment side effects and potential long-term toxicities of radiation therapy to this area with the patient.  She appears to understand and wishes to proceed with planned course of treatment.  Plan:  Patient is scheduled for CT simulation later today.  Treatments to begin early next week.  Anticipate approximately 5 weeks of radiation therapy.  This may be adjusted depending on extent of overlap with her previous pelvic fields.  Total time spent in this encounter was 25 minutes which included reviewing the patient's most recent follow-ups, pelvic MRI, CT scans, physical examination, and documentation.  -----------------------------------  Blair Promise, PhD, MD  This document serves as a record of services personally performed by Gery Pray, MD. It was created on his behalf by Clerance Lav, a trained medical scribe. The creation of this record is based on the scribe's personal observations and the provider's statements to them. This document has been checked and approved by the attending provider.

## 2019-11-04 NOTE — Progress Notes (Signed)
Patient not seen by nurse but seen by Dr. Sondra Come in Radiation.

## 2019-11-04 NOTE — Progress Notes (Signed)
Patient here for a reconsult with Dr. Sondra Come.  Name: Ashley Villegas         MRN: [id number]         Date: 11/04/2019                      DOB: July 24, 1950  Re-Evaluation Note  CC: [name], MD  Heath Lark, MD  No diagnosis found.  Diagnosis: Recurrent endometrial cancer, original stage IA grade 1, s/p hysterectomy now with enhancing vaginal wall lesion  Narrative:  The patient returns today to discuss radiation treatment options. She was initially seen in consultation on 10/29/2016, during which time she had an isolated recurrence of her endometrial cancer in the right vaginal cuff region, primarily located outside the vaginal vault. She was considered a good candidate for a definitive course of radiation therapy. Thus, she was treated with 45 Gy in 25 fractions directed at the pelvis followed by external beam boost of 14 Gy in 7 fractions directed at the site of the recurrence in the right upper vaginal area from 11/08/2016 - 12/27/2016.   Since the end of treatment, she has followed-up with radiation oncology on several occasions. On her 07/14/2018 follow-up, she was noted to have developed an additional recurrence in the lung for which she was treated with immunotherapy and was in remission. Of note, it was discontinued prior to completion secondary to side effects. CT scans of chest, abdomen, and pelvis were repeated approximately every six months for evaluation and did not show any evidence of recurrence.  She was last seen in follow-up with me on 10/15/2019. At that time, Dr. Berline Lopes had ordered an urgent pelvic MRI in light of her continued pelvic pain. MRI of pelvis was completed on 10/17/2019 and showed a heterogeneously enhancing lesion that measured 2.4 x 1.9 x 1.9 cm immediately to the right of the urethra along the anterolateral aspect of the vaginal wall, likely centered in the right levator ani 9 musculature. It was noted to be malignant in appearance but it was  uncertain as to whether it represented a new primary lesion or a metastatic lesion.  CT scan of chest on 11/02/2019 showed hepatic steatosis and aortic atherosclerosis without evidence of metastatic disease.  She was last seen by Dr. Alvy Bimler on 11/03/2019. Her case was discussed at the tumor board and given the absence of metastatic disease, it was recommended that she proceed with localized radiation therapy. She is not a surgical candidate given her history of metastatic stage IV disease. Imaging is to be repeated approximately three months after the completion of radiation therapy. If there is residual disease, Dr. Alvy Bimler will prescribe systemic treatment. If she has a complete response to therapy, then she will remain under observation.    Weight changes, if any:   Bowel/Bladder complaints, if any:  \ Nausea/Vomiting, if any:   Pain issues, if any:    SAFETY ISSUES:  Prior radiation? Yes 2018  Pacemaker/ICD?   Possible current pregnancy? hysterectomy  Is the patient on methotrexate?   Current Complaints / other details:    Vs  wt

## 2019-11-06 ENCOUNTER — Telehealth: Payer: Self-pay

## 2019-11-06 NOTE — Telephone Encounter (Signed)
-----   Message from Heath Lark, MD sent at 11/06/2019  8:01 AM EDT ----- Regarding: can you call and ask how is her pain doing

## 2019-11-06 NOTE — Telephone Encounter (Signed)
Called and instructed to call Monday with a update. She verbalized understanding.

## 2019-11-06 NOTE — Telephone Encounter (Signed)
Called and given below message. She verbalized understanding. Her pain was not bad yesterday but today she having more intense pain. She is taking the Dilaudid Rx every 4 hours. Denies constipation. She is complaining of itching all over at night mostly. Denies rash and any new soaps etc. She thinks the itching is related to the Dilaudid. She can tolerate the itching she said. She is unable to pick up a new Rx until Monday and she does not have anyone to go get Rx. At the pharmacy.

## 2019-11-09 ENCOUNTER — Telehealth: Payer: Self-pay

## 2019-11-09 ENCOUNTER — Telehealth: Payer: Self-pay | Admitting: Hematology and Oncology

## 2019-11-09 ENCOUNTER — Other Ambulatory Visit: Payer: Self-pay | Admitting: Hematology and Oncology

## 2019-11-09 MED ORDER — HYDROMORPHONE HCL 4 MG PO TABS: 4.0000 mg | ORAL_TABLET | ORAL | 0 refills | Status: DC | PRN

## 2019-11-09 MED FILL — HYDROmorphone HCL 4 MG TABS: 4 | 5 days supply | Qty: 30 | Fill #0

## 2019-11-09 NOTE — Telephone Encounter (Signed)
Called and given below message. She verbalzied understanding. Scheduling message sent by Dr. Alvy Bimler to see her next week. She verbalized understanding.

## 2019-11-09 NOTE — Progress Notes (Signed)
°  Radiation Oncology         [phone] ________________________________  Name: Ashley Villegas MRN: [id number]  Date: 11/10/2019  DOB: May 07, 1950  Simulation Verification Note    ICD-10-CM   1. Endometrial cancer ([place])  C54.1     Status: outpatient   NARRATIVE: The patient was brought to the treatment unit and placed in the planned treatment position. The clinical setup was verified. Then port films were obtained and uploaded to the radiation oncology medical record software. The treatment beams were carefully compared against the planned radiation fields. The position location and shape of the radiation fields was reviewed. They targeted volume of tissue appears to be appropriately covered by the radiation beams. Organs at risk appear to be excluded as planned.  Based on my personal review, I approved the simulation verification. The patient's treatment will proceed as planned.   -----------------------------------  Blair Promise, PhD, MD  This document serves as a record of services personally performed by Gery Pray, MD. It was created on his behalf by Clerance Lav, a trained medical scribe. The creation of this record is based on the scribe's personal observations and the provider's statements to them. This document has been checked and approved by the attending provider.

## 2019-11-09 NOTE — Telephone Encounter (Signed)
Scheduled per 8/16 sch msg. Called and spoke with pt, confirmed 8/24 appt

## 2019-11-09 NOTE — Telephone Encounter (Signed)
She can increase to 4 mg (take 2 each time) plus extra strength tylenol I sent refill with higher strength 4 mg for pick up

## 2019-11-09 NOTE — Telephone Encounter (Signed)
She called and left a message to call her.  Called back. Her pain is not any better. Asking if pain medication can be increased? Or changed to another Rx?  She has 10 left of the Dilaudid 2 mg. Itching is a little better/ no rash. She has some to go to the pharmacy today.

## 2019-11-10 ENCOUNTER — Other Ambulatory Visit: Payer: Self-pay

## 2019-11-10 ENCOUNTER — Ambulatory Visit
Admission: RE | Admit: 2019-11-10 | Discharge: 2019-11-10 | Disposition: A | Discharge status: Home / Self Care | Payer: Medicare Other | Source: Ambulatory Visit | Attending: Radiation Oncology | Admitting: Radiation Oncology

## 2019-11-10 DIAGNOSIS — C541 Malignant neoplasm of endometrium: Secondary | ICD-10-CM

## 2019-11-10 DIAGNOSIS — Z510 Encounter for antineoplastic radiation therapy: Secondary | ICD-10-CM | POA: Diagnosis not present

## 2019-11-11 ENCOUNTER — Ambulatory Visit
Admission: RE | Admit: 2019-11-11 | Discharge: 2019-11-11 | Disposition: A | Discharge status: Home / Self Care | Payer: Medicare Other | Source: Ambulatory Visit | Attending: Radiation Oncology | Admitting: Radiation Oncology

## 2019-11-11 ENCOUNTER — Ambulatory Visit: Payer: Medicare Other | Admitting: Radiation Oncology

## 2019-11-11 ENCOUNTER — Other Ambulatory Visit: Payer: Self-pay

## 2019-11-11 DIAGNOSIS — Z510 Encounter for antineoplastic radiation therapy: Secondary | ICD-10-CM | POA: Diagnosis not present

## 2019-11-12 ENCOUNTER — Ambulatory Visit
Admission: RE | Admit: 2019-11-12 | Discharge: 2019-11-12 | Disposition: A | Discharge status: Home / Self Care | Payer: Medicare Other | Source: Ambulatory Visit | Attending: Radiation Oncology | Admitting: Radiation Oncology

## 2019-11-12 ENCOUNTER — Other Ambulatory Visit: Payer: Self-pay

## 2019-11-12 DIAGNOSIS — Z510 Encounter for antineoplastic radiation therapy: Secondary | ICD-10-CM | POA: Diagnosis not present

## 2019-11-13 ENCOUNTER — Ambulatory Visit
Admission: RE | Admit: 2019-11-13 | Discharge: 2019-11-13 | Disposition: A | Discharge status: Home / Self Care | Payer: Medicare Other | Source: Ambulatory Visit | Attending: Radiation Oncology | Admitting: Radiation Oncology

## 2019-11-13 ENCOUNTER — Other Ambulatory Visit: Payer: Self-pay

## 2019-11-13 DIAGNOSIS — Z510 Encounter for antineoplastic radiation therapy: Secondary | ICD-10-CM | POA: Diagnosis not present

## 2019-11-16 ENCOUNTER — Other Ambulatory Visit: Payer: Self-pay

## 2019-11-16 ENCOUNTER — Ambulatory Visit
Admission: RE | Admit: 2019-11-16 | Discharge: 2019-11-16 | Disposition: A | Discharge status: Home / Self Care | Payer: Medicare Other | Source: Ambulatory Visit | Attending: Radiation Oncology | Admitting: Radiation Oncology

## 2019-11-16 DIAGNOSIS — Z510 Encounter for antineoplastic radiation therapy: Secondary | ICD-10-CM | POA: Diagnosis not present

## 2019-11-17 ENCOUNTER — Encounter: Payer: Self-pay | Admitting: Hematology and Oncology

## 2019-11-17 ENCOUNTER — Inpatient Hospital Stay: Payer: Medicare Other | Attending: Hematology and Oncology | Admitting: Hematology and Oncology

## 2019-11-17 ENCOUNTER — Ambulatory Visit
Admission: RE | Admit: 2019-11-17 | Discharge: 2019-11-17 | Disposition: A | Discharge status: Home / Self Care | Payer: Medicare Other | Source: Ambulatory Visit | Attending: Radiation Oncology | Admitting: Radiation Oncology

## 2019-11-17 ENCOUNTER — Telehealth: Payer: Self-pay

## 2019-11-17 ENCOUNTER — Inpatient Hospital Stay: Payer: Medicare Other

## 2019-11-17 ENCOUNTER — Other Ambulatory Visit: Payer: Self-pay

## 2019-11-17 VITALS — BP 116/90 | HR 69 | Temp 97.9°F | Resp 18 | Ht 60.0 in | Wt 173.6 lb

## 2019-11-17 DIAGNOSIS — Z299 Encounter for prophylactic measures, unspecified: Secondary | ICD-10-CM | POA: Insufficient documentation

## 2019-11-17 DIAGNOSIS — Z23 Encounter for immunization: Secondary | ICD-10-CM

## 2019-11-17 DIAGNOSIS — C541 Malignant neoplasm of endometrium: Secondary | ICD-10-CM | POA: Diagnosis present

## 2019-11-17 DIAGNOSIS — Z510 Encounter for antineoplastic radiation therapy: Secondary | ICD-10-CM | POA: Diagnosis not present

## 2019-11-17 DIAGNOSIS — G893 Neoplasm related pain (acute) (chronic): Secondary | ICD-10-CM | POA: Diagnosis not present

## 2019-11-17 NOTE — Progress Notes (Signed)
   Covid-19 Vaccination Clinic  Name:  Ashley Villegas    MRN: [id number] DOB: Jun 07, 1950  11/17/2019  Ms. Ashley Villegas was observed post Covid-19 immunization for 15 minutes without incident. She was provided with Vaccine Information Sheet and instruction to access the V-Safe system.   Ms. Ashley Villegas was instructed to call 911 with any severe reactions post vaccine: Marland Kitchen Difficulty breathing  . Swelling of face and throat  . A fast heartbeat  . A bad rash all over body  . Dizziness and weakness   Immunizations Administered    Name Date Dose VIS Date Route   Pfizer COVID-19 Vaccine 11/17/2019 12:39 PM 0.3 mL 05/20/2018 Intramuscular   Manufacturer: [place]   Lot: [id number]   [place]: [id number]

## 2019-11-17 NOTE — Assessment & Plan Note (Signed)
She is doing well and is taking minimal pain medicine We discussed narcotic refill policy She has no side effects from pain medicine recently

## 2019-11-17 NOTE — Assessment & Plan Note (Signed)
We discussed the role of booster vaccination for patients like her who is undergoing treatment She is interested to receive the booster vaccine and we will administer it today

## 2019-11-17 NOTE — Telephone Encounter (Signed)
Called to check on her due to not arriving for 1120 appt. She sitting in the lobby. Arrived to appt.

## 2019-11-17 NOTE — Assessment & Plan Note (Signed)
She tolerated radiation therapy well Her pelvic pain is almost completely resolved Per discussion with her radiation oncologist, she will likely finish her radiation next week I recommend waiting approximately 8 weeks from the last day of radiation before ordering another imaging study Based on her prior imaging studies, the best imaging with the MRI evaluation I will order MRI of the pelvis at the end of October for further follow-up If she have complete response to treatment, we can discuss the role of maintenance treatment versus observation

## 2019-11-17 NOTE — Progress Notes (Signed)
[place] OFFICE PROGRESS NOTE  Patient Care Team: [name], MD as PCP - General (Internal Medicine) Jacelyn Pi, MD as Consulting Physician (Endocrinology) Heath Lark, MD as Consulting Physician (Hematology and Oncology)  ASSESSMENT & PLAN:  Endometrial cancer [facility]) She tolerated radiation therapy well Her pelvic pain is almost completely resolved Per discussion with her radiation oncologist, she will likely finish her radiation next week I recommend waiting approximately 8 weeks from the last day of radiation before ordering another imaging study Based on her prior imaging studies, the best imaging with the MRI evaluation I will order MRI of the pelvis at the end of October for further follow-up If she have complete response to treatment, we can discuss the role of maintenance treatment versus observation  Cancer associated pain She is doing well and is taking minimal pain medicine We discussed narcotic refill policy She has no side effects from pain medicine recently  Preventive measure We discussed the role of booster vaccination for patients like her who is undergoing treatment She is interested to receive the booster vaccine and we will administer it today   Orders Placed This Encounter  Procedures  . MR Pelvis W Wo Contrast    Standing Status:   Future    Standing Expiration Date:   11/16/2020    Order Specific Question:   If indicated for the ordered procedure, I authorize the administration of contrast media per Radiology protocol    Answer:   Yes    Order Specific Question:   What is the patient's sedation requirement?    Answer:   No Sedation    Order Specific Question:   Does the patient have a pacemaker or implanted devices?    Answer:   No    Order Specific Question:   Radiology Contrast Protocol - do NOT remove file path    Answer:   \\[email or web address]    Order Specific Question:   Preferred imaging location?     Answer:   [facility] (table limit - 550 lbs)    All questions were answered. The patient knows to call the clinic with any problems, questions or concerns. The total time spent in the appointment was 20 minutes encounter with patients including review of chart and various tests results, discussions about plan of care and coordination of care plan   Heath Lark, MD 11/17/2019 12:27 PM  INTERVAL HISTORY: Please see below for problem oriented charting. She returns for further evaluation Since last time I saw her and talk to her, her pain is getting better She had some recent dysuria but that has resolved Denies constipation or diarrhea Her appetite is fair She is feeling better She tolerated radiation therapy very well  SUMMARY OF ONCOLOGIC HISTORY: Oncology History Overview Note  Abnormal MSI, germline mutation was negative Endometrioid   Endometrial cancer ([place])  09/15/2014 Pathology Results   Endometrium, curettage - ENDOMETRIAL ADENOCARCINOMA. - SEE COMMENT. Microscopic Comment Although definitive characterization is best performed on resection specimen, as sampled, the endometrial adenocarcinoma appears to be endometrioid subtype, FIGO grade 1.    09/15/2014 Surgery   PreOp: postmenopausal bleeding, cervical stenosis PostOp: same and uterine polyp Procedure:  Hysteroscopy, Dilation and Curettage, Myosure polypectomy Surgeon: Dr. Janyth Pupa  Findings:8cm uterus with thickened polypoid-like endometrium, large 2cm irregular appearing polyp  Specimens: 1) endometrial curettings   10/26/2014 Pathology Results   1. Uterus +/- tubes/ovaries, neoplastic - INVASIVE ADENOCARCINOMA, ENDOMETRIOID TYPE, SEE COMMENT. - TUMOR INVOLVES LESS THAN ONE HALF MYOMETRIAL  THICKNESS. - TUMOR INVOLVES UTERINE ADENOMYOSIS. - BENIGN LEIOMYOMATA (UP TO 2.5 CM). - BENIGN CERVICAL MUCOSA; NEGATIVE FOR INTRAEPITHELIAL LESION OR MALIGNANCY. - BENIGN RIGHT AND LEFT OVARIES; NEGATIVE  FOR ATYPIA OR MALIGNANCY. - BENIGN RIGHT AND LEFT FALLOPIAN TUBE; NEGATIVE FOR ATYPIA OR MALIGNANCY. - BENIGN PARATUBAL CYSTS; NEGATIVE FOR ATYPIA OR MALIGNANCY. - SEE TUMOR SYNOPTIC TEMPLATE BELOW. 2. Lymph nodes, regional resection, right pelvic - FOUR LYMPH NODES, NEGATIVE FOR TUMOR (0/4). 3. Lymph nodes, regional resection, left pelvic - FOUR LYMPH NODES, NEGATIVE FOR TUMOR (0/4). Microscopic Comment 1. ONCOLOGY TABLE-UTERUS, CARCINOMA OR CARCINOSARCOMA Specimen: Uterus and bilateral fallopian tubes and ovaries. Procedure: Hysterectomy and bilateral salpingo-oophorectomy. Lymph node sampling performed: Yes. Specimen integrity: Intact. Maximum tumor size: 4.0 cm (tumor involved entire endometrium) Histologic type: Adenocarcinoma, endometrioid type. Grade: 1 Myometrial invasion: 0.5 cm where myometrium is 1.5 cm in thickness Cervical stromal involvement: Absent. Extent of involvement of other organs: None Lymph - vascular invasion: Absent. Peritoneal washings: N/A Lymph nodes: # examined - 8 ; # positive - 0 Pelvic lymph nodes: N/A involved of N/A lymph nodes. Para-aortic lymph nodes: N/A involved of N/A lymph nodes. Other (specify involvement and site): N/A TNM code: pT1a, pN0 FIGO Stage (based on pathologic findings, needs clinical correlation): N/A Comments: None MSI testing abnormal   10/26/2014 Surgery   Surgery: Total robotic hysterectomy, bilateral salpingo-oophorectomy, bilateral pelvic lymph node dissection.   Surgeons:  Lucita Lora. Alycia Rossetti, MD; Lahoma Crocker, MD   Pathology: Uterus, cervix, bilateral tubes and ovaries, bilateral pelvic lymph nodes to pathology.   Operative findings: Omentum adherent to midline vertical incision. Fibroid uterus. Normal adnexa. Adhesions of anterior bladder to uterus. Frozen section with grade 1 cancer focally involving adenomyosis, minimal myometrial invasion.   11/17/2014 Imaging   Ct abdomen 1. Status post hysterectomy.  Pelvic edema which is greater than typically seen 3 weeks postop. Suspicious for postoperative infection. 2. Bladder wall thickening and irregularity. Although this could be partially due to underdistention, Concurrent cystitis cannot be excluded. 3. Bilateral fluid density lesions along the pelvic sidewalls. Favored to represent seromas or lymphangiomas. No specific features to suggest abscess. If the patient's symptoms persist after appropriate antibiotic therapy, aspiration of the largest left pelvic sidewall "Lesion" should be considered. 4. Suspicion of mild hepatic steatosis. Indeterminate right hepatic lobe lesion. If definitive characterization is desired in this patient with history of primary malignancy, nonemergent pre and post contrast abdominal MRI could be performed. 5. Hiatal hernia.   11/25/2014 Imaging   1. Persistent bilateral pelvic sidewall fluid collections, left greater than right, with left-sided inflammatory changes. 2. Technically successful 12 French left pelvic abscess drain catheter placement. A sample of the aspirate was sent for Gram stain, culture and sensitivity.   12/08/2014 Imaging   CT abdomen and pelvis 1. Complete resolution of dominant fluid collection within the left hemipelvis following percutaneous drainage catheter placement. This percutaneous drainage catheter was subsequent removed intact at the patient's bedside. 2. Continued decrease in size of bilobed fluid collections within the right hemipelvis with superficial component measuring 1.6 cm and posterior component measuring 2.6 cm, indeterminate though both favored to represent evolving seromas. 3. Unchanged indeterminate approximately 1.2 cm hepatic lesion. Further evaluation with contrast-enhanced abdominal MRI could be performed as clinically indicated. 4. Colonic diverticulosis without evidence of diverticulitis.   10/19/2016 Imaging   New 2.6 cm soft tissue mass involving right vaginal cuff,  consistent with locally recurrent carcinoma.  No other sites of metastatic disease identified.  Colonic diverticulosis, without radiographic evidence  of diverticulitis.  Mild hepatic steatosis.    10/24/2016 Relapse/Recurrence   Vaginal cuff recurrence. No evidence of distant disease.   10/24/2016 Pathology Results   Vagina, biopsy, right apex - ADENOCARCINOMA - SEE COMMENT Microscopic Comment The morphologic features are consistent with the patient's previously diagnosed endometrioid adenocarcinoma   11/08/2016 - 12/27/2016 Radiation Therapy   45 Gy in 25 fractions of 1.8 Gy. The residual pelvic mass was boosted to 14 Gy in 7 fractions of 2 Gy. No intracavitary lesion seen at completion of pelvic fields to boost with brachytherapy.   06/12/2017 Imaging   CT abdomen and pelvis 1. New 1.1 by 0.8 cm right lower lobe pulmonary nodule, concerning for pulmonary metastatic disease. Possibilities for further assessment include biopsy or nuclear medicine PET-CT to assess this lesion and the rest of the neck/chest/abdomen/pelvis for other potential hypermetabolic lesions. 2. The previous soft tissue mass along the right vaginal cuff is no longer present. 3. Stable and likely benign right hepatic lobe hypodense lesion. 4. Other imaging findings of potential clinical significance: Sigmoid colon diverticulosis. Aortic Atherosclerosis (ICD10-I70.0). Multilevel lumbar degenerative disc disease. Mild cardiomegaly. Small type 1 hiatal hernia.   07/05/2017 Imaging   Ct chest Several bilateral pulmonary nodules measuring up to 11 mm, highly suspicious for pulmonary metastases.   No evidence of lymphadenopathy or pleural effusion.  Aortic Atherosclerosis (ICD10-I70.0).   07/25/2017 Imaging   Status post CT-guided lung nodule biopsy. Tissue specimen sent to pathology for complete histopathologic analysis.   07/25/2017 Pathology Results   Lung, needle/core biopsy(ies), RLL - METASTATIC  ADENOCARCINOMA - SEE COMMENT Microscopic Comment By immunohistochemistry, the neoplastic cells are positive for Pax-8 and ER but negative for TTF-1. Overall, this immunoprofile is consistent with metastasis from the patient's known endometrioid adenocarcinoma   07/30/2017 Cancer Staging   Staging form: Corpus Uteri - Carcinoma and Carcinosarcoma, AJCC 8th Edition - Clinical: Stage IVB (rcT1, cN0, pM1) - Signed by Heath Lark, MD on 07/30/2017   08/09/2017 - 11/21/2017 Chemotherapy   The patient had pembrolizumab for chemotherapy treatment.    10/10/2017 Imaging   1. Interval decrease in size and contour. The of previously described right and left lower lobe pulmonary nodules. 2. Interval increase in size of mediastinal and hilar adenopathy, potentially metastatic in etiology. 3. Aortic Atherosclerosis (ICD10-I70.0).   11/08/2017 - 11/10/2017 Hospital Admission   She was admitted to the hospital due to uncontrolled pain   11/29/2017 Imaging   Chest Impression:  No thoracic metastasis. No discrete pulmonary nodules identified.  Abdomen / Pelvis Impression:  No evidence of local recurrence or metastatic endometrial carcinoma in the abdomen pelvis.   02/21/2018 Imaging   1. No findings to suggest metastatic disease in the chest, abdomen or pelvis. 2. Mild colonic diverticulosis without evidence of acute diverticulitis at this time. 3. Aortic atherosclerosis. 4. Additional incidental findings, as above.   03/10/2018 Imaging   Bone scan: No scintigraphic evidence of osseous metastatic disease.   05/22/2018 Imaging   1. Stable exam. No evidence of recurrent or metastatic carcinoma within the chest, abdomen, or pelvis. 2. Colonic diverticulosis, without radiographic evidence of diverticulitis.   11/13/2018 Imaging   1. No evidence of recurrent or metastatic carcinoma within the chest, abdomen, or pelvis. 2. Colonic diverticulosis, without radiographic evidence of diverticulitis. 3. Stable  hepatic steatosis.   Aortic Atherosclerosis (ICD10-I70.0).   05/22/2019 Imaging   1. No evidence of recurrent or metastatic disease in the chest, abdomen, or pelvis status post hysterectomy. 2. Previously seen metastatic pulmonary nodules  in the bilateral lower lobes remain resolved. 3. Unchanged irregular opacities of the peripheral right upper lobe, likely chronic sequelae of prior infection or inflammation. 4. Hepatic steatosis. 5. Sigmoid diverticulosis. 6. Aortic Atherosclerosis (ICD10-I70.0).     10/18/2019 Imaging   1. Heterogeneously enhancing lesion measuring 2.4 x 1.9 x 1.9 cm immediately to the right of the urethra along the anterolateral aspect of the vaginal wall, likely centered in the right levator ani 9 musculature. This has a malignant appearance, but whether this represents a new primary lesion or a metastatic lesion is uncertain on today's examination. 2. Mild colonic diverticulosis.     11/02/2019 Imaging   CT chest 1. No evidence of metastatic disease. 2. Hepatic steatosis. 3.  Aortic atherosclerosis (ICD10-I70.0).   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    Metastasis to lung ([place])  07/30/2017 Initial Diagnosis   Metastasis to lung ([place])   08/09/2017 - 11/21/2017 Chemotherapy   The patient had pembrolizumab (KEYTRUDA) 200 mg in sodium chloride 0.9 % 50 mL chemo infusion, 200 mg, Intravenous, Once, 5 of 7 cycles Administration: 200 mg (08/09/2017), 200 mg (08/30/2017), 200 mg (09/20/2017), 200 mg (10/11/2017), 200 mg (11/01/2017)  for chemotherapy treatment.      REVIEW OF SYSTEMS:    Constitutional: Denies fevers, chills or abnormal weight loss Eyes: Denies blurriness of vision Ears, nose, mouth, throat, and face: Denies mucositis or sore throat Respiratory: Denies cough, dyspnea or wheezes Cardiovascular: Denies palpitation, chest discomfort or lower extremity swelling Gastrointestinal:  Denies nausea, heartburn or change in bowel habits Skin: Denies abnormal skin rashes Lymphatics: Denies new lymphadenopathy or easy bruising Neurological:Denies numbness, tingling or new weaknesses Behavioral/Psych: Mood is stable, no new changes  All other systems were reviewed with the patient and are negative.  I have reviewed the past medical history, past surgical history, social history and family history with the patient and they are unchanged from previous note.  ALLERGIES:  is allergic to oxycodone and hydrocodone.  MEDICATIONS:  Current Outpatient Medications  Medication Sig Dispense Refill  . Cholecalciferol (VITAMIN D) 50 MCG (2000 UT) CAPS Take 2,000 Units by mouth daily.     Marland Kitchen FLUoxetine (PROZAC) 40 MG capsule Take 40 mg by mouth daily.    Marland Kitchen HYDROmorphone (DILAUDID) 4 MG tablet Take 1 tablet (4 mg total) by mouth every 4 (four) hours as needed for severe pain. 30 tablet 0  . SYNTHROID 100 MCG tablet Take 100 mcg by mouth daily before breakfast.      No current facility-administered medications for this visit.    PHYSICAL EXAMINATION: ECOG PERFORMANCE STATUS: 1 - Symptomatic but completely ambulatory  Vitals:   11/17/19 1215  BP: 116/90  Pulse: 69  Resp: 18  Temp: 97.9 F (36.6 C)  SpO2: 99%   Filed Weights   11/17/19 1215  Weight: 173 lb 9.6 oz (78.7 kg)    GENERAL:alert, no distress and comfortable NEURO: alert & oriented x 3 with fluent speech, no focal motor/sensory deficits  LABORATORY DATA:  I have reviewed the data as listed    Component Value Date/Time   NA 143 10/15/2019 1507   K 4.0 10/15/2019 1507  CL 103 10/15/2019 1507   CO2 28  10/15/2019 1507   GLUCOSE 89 10/15/2019 1507   BUN 12 10/15/2019 1507   BUN 18.7 10/17/2016 1353   CREATININE 0.75 10/15/2019 1507   CREATININE 0.8 10/17/2016 1353   CALCIUM 9.6 10/15/2019 1507   PROT 7.2 10/15/2019 1507   ALBUMIN 4.1 10/15/2019 1507   AST 16 10/15/2019 1507   ALT 19 10/15/2019 1507   ALKPHOS 54 10/15/2019 1507   BILITOT 0.7 10/15/2019 1507   GFRNONAA >60 10/15/2019 1507   GFRAA >60 10/15/2019 1507    No results found for: SPEP, UPEP  Lab Results  Component Value Date   WBC 6.2 10/15/2019   NEUTROABS 4.4 10/15/2019   HGB 13.9 10/15/2019   HCT 41.9 10/15/2019   MCV 92.1 10/15/2019   PLT 268 10/15/2019      Chemistry      Component Value Date/Time   NA 143 10/15/2019 1507   K 4.0 10/15/2019 1507   CL 103 10/15/2019 1507   CO2 28 10/15/2019 1507   BUN 12 10/15/2019 1507   BUN 18.7 10/17/2016 1353   CREATININE 0.75 10/15/2019 1507   CREATININE 0.8 10/17/2016 1353      Component Value Date/Time   CALCIUM 9.6 10/15/2019 1507   ALKPHOS 54 10/15/2019 1507   AST 16 10/15/2019 1507   ALT 19 10/15/2019 1507   BILITOT 0.7 10/15/2019 1507

## 2019-11-18 ENCOUNTER — Other Ambulatory Visit: Payer: Self-pay

## 2019-11-18 ENCOUNTER — Ambulatory Visit
Admission: RE | Admit: 2019-11-18 | Discharge: 2019-11-18 | Disposition: A | Discharge status: Home / Self Care | Payer: Medicare Other | Source: Ambulatory Visit | Attending: Radiation Oncology | Admitting: Radiation Oncology

## 2019-11-18 ENCOUNTER — Telehealth: Payer: Self-pay | Admitting: Hematology and Oncology

## 2019-11-18 DIAGNOSIS — Z510 Encounter for antineoplastic radiation therapy: Secondary | ICD-10-CM | POA: Diagnosis not present

## 2019-11-18 NOTE — Telephone Encounter (Signed)
error 

## 2019-11-19 ENCOUNTER — Ambulatory Visit
Admission: RE | Admit: 2019-11-19 | Discharge: 2019-11-19 | Disposition: A | Discharge status: Home / Self Care | Payer: Medicare Other | Source: Ambulatory Visit | Attending: Radiation Oncology | Admitting: Radiation Oncology

## 2019-11-19 ENCOUNTER — Other Ambulatory Visit: Payer: Self-pay

## 2019-11-19 DIAGNOSIS — Z510 Encounter for antineoplastic radiation therapy: Secondary | ICD-10-CM | POA: Diagnosis not present

## 2019-11-20 ENCOUNTER — Other Ambulatory Visit: Payer: Self-pay

## 2019-11-20 ENCOUNTER — Ambulatory Visit
Admission: RE | Admit: 2019-11-20 | Discharge: 2019-11-20 | Disposition: A | Discharge status: Home / Self Care | Payer: Medicare Other | Source: Ambulatory Visit | Attending: Radiation Oncology | Admitting: Radiation Oncology

## 2019-11-20 DIAGNOSIS — Z510 Encounter for antineoplastic radiation therapy: Secondary | ICD-10-CM | POA: Diagnosis not present

## 2019-11-23 ENCOUNTER — Ambulatory Visit
Admission: RE | Admit: 2019-11-23 | Discharge: 2019-11-23 | Disposition: A | Discharge status: Home / Self Care | Payer: Medicare Other | Source: Ambulatory Visit | Attending: Radiation Oncology | Admitting: Radiation Oncology

## 2019-11-23 ENCOUNTER — Other Ambulatory Visit: Payer: Self-pay

## 2019-11-23 DIAGNOSIS — Z510 Encounter for antineoplastic radiation therapy: Secondary | ICD-10-CM | POA: Diagnosis not present

## 2019-11-24 ENCOUNTER — Other Ambulatory Visit: Payer: Self-pay

## 2019-11-24 ENCOUNTER — Ambulatory Visit
Admission: RE | Admit: 2019-11-24 | Discharge: 2019-11-24 | Disposition: A | Discharge status: Home / Self Care | Payer: Medicare Other | Source: Ambulatory Visit | Attending: Radiation Oncology | Admitting: Radiation Oncology

## 2019-11-24 DIAGNOSIS — Z510 Encounter for antineoplastic radiation therapy: Secondary | ICD-10-CM | POA: Diagnosis not present

## 2019-11-25 ENCOUNTER — Other Ambulatory Visit: Payer: Self-pay

## 2019-11-25 ENCOUNTER — Ambulatory Visit
Admission: RE | Admit: 2019-11-25 | Discharge: 2019-11-25 | Disposition: A | Discharge status: Home / Self Care | Payer: Medicare Other | Source: Ambulatory Visit | Attending: Radiation Oncology | Admitting: Radiation Oncology

## 2019-11-25 DIAGNOSIS — Z510 Encounter for antineoplastic radiation therapy: Secondary | ICD-10-CM | POA: Insufficient documentation

## 2019-11-25 DIAGNOSIS — C541 Malignant neoplasm of endometrium: Secondary | ICD-10-CM | POA: Diagnosis present

## 2019-11-26 ENCOUNTER — Other Ambulatory Visit: Payer: 59

## 2019-11-26 ENCOUNTER — Other Ambulatory Visit: Payer: Self-pay

## 2019-11-26 ENCOUNTER — Ambulatory Visit
Admission: RE | Admit: 2019-11-26 | Discharge: 2019-11-26 | Disposition: A | Discharge status: Home / Self Care | Payer: Medicare Other | Source: Ambulatory Visit | Attending: Radiation Oncology | Admitting: Radiation Oncology

## 2019-11-26 DIAGNOSIS — Z510 Encounter for antineoplastic radiation therapy: Secondary | ICD-10-CM | POA: Diagnosis not present

## 2019-11-27 ENCOUNTER — Encounter: Payer: Self-pay | Admitting: Radiation Oncology

## 2019-11-27 ENCOUNTER — Ambulatory Visit
Admission: RE | Admit: 2019-11-27 | Discharge: 2019-11-27 | Disposition: A | Discharge status: Home / Self Care | Payer: Medicare Other | Source: Ambulatory Visit | Attending: Radiation Oncology | Admitting: Radiation Oncology

## 2019-11-27 ENCOUNTER — Other Ambulatory Visit: Payer: Self-pay

## 2019-11-27 ENCOUNTER — Ambulatory Visit: Payer: 59 | Admitting: Hematology and Oncology

## 2019-11-27 DIAGNOSIS — Z510 Encounter for antineoplastic radiation therapy: Secondary | ICD-10-CM | POA: Diagnosis not present

## 2019-12-01 ENCOUNTER — Ambulatory Visit: Payer: Medicare Other

## 2019-12-02 ENCOUNTER — Ambulatory Visit: Payer: Medicare Other

## 2019-12-03 ENCOUNTER — Ambulatory Visit: Payer: Medicare Other

## 2019-12-04 ENCOUNTER — Ambulatory Visit: Payer: Medicare Other

## 2019-12-07 ENCOUNTER — Ambulatory Visit: Payer: Medicare Other

## 2019-12-08 ENCOUNTER — Ambulatory Visit: Payer: Medicare Other

## 2019-12-09 ENCOUNTER — Telehealth: Payer: Self-pay

## 2019-12-09 ENCOUNTER — Other Ambulatory Visit: Payer: Medicare Other

## 2019-12-09 ENCOUNTER — Ambulatory Visit: Payer: Medicare Other

## 2019-12-09 ENCOUNTER — Other Ambulatory Visit: Payer: Self-pay

## 2019-12-09 DIAGNOSIS — Z20822 Contact with and (suspected) exposure to covid-19: Secondary | ICD-10-CM

## 2019-12-09 NOTE — Telephone Encounter (Signed)
She called and left a message. Her daughter tested positive today for covid. She and her son have been around her daughter Sunday and today without a mask. Instructed per Dr. Alvy Bimler for her and her son to get tested for covid. Ask her to call the office back for questions. She verbalized understanding.

## 2019-12-10 ENCOUNTER — Ambulatory Visit: Payer: Medicare Other

## 2019-12-11 ENCOUNTER — Ambulatory Visit: Payer: Medicare Other

## 2019-12-12 LAB — NOVEL CORONAVIRUS, NAA: SARS-CoV-2, NAA: NOT DETECTED

## 2019-12-14 ENCOUNTER — Ambulatory Visit: Payer: Medicare Other

## 2019-12-15 ENCOUNTER — Ambulatory Visit: Payer: Medicare Other

## 2019-12-16 ENCOUNTER — Ambulatory Visit: Payer: Medicare Other

## 2019-12-23 NOTE — Progress Notes (Incomplete)
  Patient Name: Ashley Villegas MRN: [id number] DOB: 12-31-1950 Referring Physician: [name] (Profile Not Attached) Date of Service: 11/27/2019 [facility], [place]                                                        End Of Treatment Note  Diagnoses: C54.1-Malignant neoplasm of endometrium Z85.42-Personal history of malignant neoplasm of other parts of uterus  Cancer Staging: Recurrent endometrial cancer, original stage IA grade 1, s/p hysterectomy now with enhancing lesion centered along the right levator ani muscle  Intent: Palliative  Radiation Treatment Dates: 11/10/2019 through 11/27/2019 Site Technique Total Dose (Gy) Dose per Fx (Gy) Completed Fx Beam Energies  Perineum: Pelvis IMRT 28/28 2 14/14 6X   Narrative: The patient tolerated radiation therapy relatively well. She did report some vaginal pain at the beginning of treatment that resolved. She also reported mild fatigue, mild diarrhea controlled with Imodium, nausea that resolved, occasional dysuria that resolved, and urinary frequency that also resolved. She denied vomiting, hematuria, rectal bleeding, and vaginal bleeding. She was completely pain-free at the end of treatment.  Plan: The patient will follow-up with radiation oncology in one month.  ________________________________________________   Blair Promise, PhD, MD  This document serves as a record of services personally performed by Gery Pray, MD. It was created on his behalf by Clerance Lav, a trained medical scribe. The creation of this record is based on the scribe's personal observations and the provider's statements to them. This document has been checked and approved by the attending provider.

## 2019-12-30 NOTE — Progress Notes (Signed)
°  Radiation Oncology         [phone] ________________________________  Name: Ashley Villegas MRN: [id number]  Date: 12/31/2019  DOB: 1950-05-29  Follow-Up Visit Note  CC: [name], MD  [name], MD    ICD-10-CM   1. Endometrial cancer ([facility])  C54.1     Diagnosis: Recurrent endometrial cancer, original stage IA grade 1, s/p hysterectomynow with enhancinglesion centered along the right levator ani muscle  Interval Since Last Radiation: One month and four days  Radiation Treatment Dates: 11/10/2019 through 11/27/2019 Site Technique Total Dose (Gy) Dose per Fx (Gy) Completed Fx Beam Energies  Perineum: Pelvis IMRT 28/28 2 14/14 6X    Narrative:  The patient returns today for routine follow-up. No significant interval history since the end of treatment.  On review of systems, she reports no complaints. She denies pelvic pain, fatigue, vaginal bleeding, and change in bowel/bladder patterns.     She has stopped taking any pain medications              ALLERGIES:  is allergic to oxycodone and hydrocodone.  Meds: Current Outpatient Medications  Medication Sig Dispense Refill   Cholecalciferol (VITAMIN D) 50 MCG (2000 UT) CAPS Take 2,000 Units by mouth daily.      FLUoxetine (PROZAC) 40 MG capsule Take 40 mg by mouth daily.     HYDROmorphone (DILAUDID) 4 MG tablet Take 1 tablet (4 mg total) by mouth every 4 (four) hours as needed for severe pain. 30 tablet 0   SYNTHROID 100 MCG tablet Take 100 mcg by mouth daily before breakfast.      No current facility-administered medications for this encounter.    Physical Findings: The patient is in no acute distress. Patient is alert and oriented.  height is 5' (1.524 m) and weight is 172 lb 2 oz (78.1 kg). Her tympanic temperature is 96.3 F (35.7 C) (abnormal). Her blood pressure is 131/68 and her pulse is 65. Her respiration is 18 and oxygen saturation is 98%.   Lungs are clear to auscultation bilaterally. Heart  has regular rate and rhythm. No palpable cervical, supraclavicular, or axillary adenopathy. Abdomen soft, non-tender, normal bowel sounds. Pelvic exam deferred in light of recent radiation therapy.   Lab Findings: Lab Results  Component Value Date   WBC 6.2 10/15/2019   HGB 13.9 10/15/2019   HCT 41.9 10/15/2019   MCV 92.1 10/15/2019   PLT 268 10/15/2019    Radiographic Findings: No results found.  Impression: Recurrent endometrial cancer, original stage IA grade 1, s/p hysterectomynow with enhancinglesion centered along the right levator ani muscle  Patient tolerated her radiation therapy extremely well.  She in addition has had complete pain relief  Plan: The patient is scheduled to undergo a pelvic MRI on 01/21/2020. She will then follow up with Dr. Alvy Bimler on 01/22/2020. She will follow up with radiation oncology in 5 months.  I have recommended that she follow-up with Dr. Denman George in 2 months   ____________________________________   Blair Promise, PhD, MD  This document serves as a record of services personally performed by Gery Pray, MD. It was created on his behalf by Clerance Lav, a trained medical scribe. The creation of this record is based on the scribe's personal observations and the provider's statements to them. This document has been checked and approved by the attending provider.

## 2019-12-31 ENCOUNTER — Other Ambulatory Visit: Payer: Self-pay

## 2019-12-31 ENCOUNTER — Ambulatory Visit
Admission: RE | Admit: 2019-12-31 | Discharge: 2019-12-31 | Disposition: A | Discharge status: Home / Self Care | Payer: Medicare Other | Source: Ambulatory Visit | Attending: Radiation Oncology | Admitting: Radiation Oncology

## 2019-12-31 ENCOUNTER — Encounter: Payer: Self-pay | Admitting: Radiation Oncology

## 2019-12-31 DIAGNOSIS — Z90710 Acquired absence of both cervix and uterus: Secondary | ICD-10-CM | POA: Insufficient documentation

## 2019-12-31 DIAGNOSIS — C541 Malignant neoplasm of endometrium: Secondary | ICD-10-CM | POA: Insufficient documentation

## 2019-12-31 DIAGNOSIS — Z79899 Other long term (current) drug therapy: Secondary | ICD-10-CM | POA: Diagnosis not present

## 2019-12-31 DIAGNOSIS — Z923 Personal history of irradiation: Secondary | ICD-10-CM | POA: Diagnosis not present

## 2019-12-31 NOTE — Progress Notes (Signed)
Patient here for a 1 month follow up visit with Dr. Sondra Come. Patient denies pain or fatigue. She also denies no bleeding bowel or bladder problems.  BP 131/68 (BP Location: Left Arm, Patient Position: Sitting)   Pulse 65   Temp (!) 96.3 F (35.7 C) (Tympanic)   Resp 18   Ht 5' (1.524 m)   Wt 172 lb 2 oz (78.1 kg)   SpO2 98%   BMI 33.62 kg/m   Wt Readings from Last 3 Encounters:  12/31/19 172 lb 2 oz (78.1 kg)  11/17/19 173 lb 9.6 oz (78.7 kg)  11/03/19 174 lb 3.2 oz (79 kg)

## 2020-01-13 ENCOUNTER — Telehealth: Payer: Self-pay | Admitting: *Deleted

## 2020-01-13 NOTE — Telephone Encounter (Signed)
Enid Derry from radiation called and scheduled a follow up in December with Dr Berline Lopes. Enid Derry will contact the patient

## 2020-01-13 NOTE — Telephone Encounter (Signed)
CALLED PATIENT TO INFORM OF FU WITH DR. Berline Lopes ON 02-26-20 - ARRIVAL TIME - 12:30 PM AND HER FU WITH DR. KINARD ON 06-02-20 @ 11:30 AM, SPOKE WITH PATIENT AND SHE IS AWARE OF THESE  APPTS.

## 2020-01-21 ENCOUNTER — Other Ambulatory Visit: Payer: Self-pay

## 2020-01-21 ENCOUNTER — Ambulatory Visit (HOSPITAL_COMMUNITY)
Admission: RE | Admit: 2020-01-21 | Discharge: 2020-01-21 | Disposition: A | Discharge status: Home / Self Care | Payer: Medicare Other | Source: Ambulatory Visit | Attending: Radiation Oncology | Admitting: Radiation Oncology

## 2020-01-21 DIAGNOSIS — C541 Malignant neoplasm of endometrium: Secondary | ICD-10-CM | POA: Insufficient documentation

## 2020-01-21 MED ORDER — GADOBUTROL 1 MMOL/ML IV SOLN
6.0000 mL | Freq: Once | INTRAVENOUS | Status: AC | PRN
  Administered 2020-01-21: 6 mL via INTRAVENOUS

## 2020-01-22 ENCOUNTER — Encounter: Payer: Self-pay | Admitting: Hematology and Oncology

## 2020-01-22 ENCOUNTER — Other Ambulatory Visit: Payer: Self-pay

## 2020-01-22 ENCOUNTER — Inpatient Hospital Stay: Payer: Medicare Other | Attending: Hematology and Oncology | Admitting: Hematology and Oncology

## 2020-01-22 DIAGNOSIS — Z90722 Acquired absence of ovaries, bilateral: Secondary | ICD-10-CM | POA: Insufficient documentation

## 2020-01-22 DIAGNOSIS — C541 Malignant neoplasm of endometrium: Secondary | ICD-10-CM | POA: Diagnosis present

## 2020-01-22 DIAGNOSIS — Z9079 Acquired absence of other genital organ(s): Secondary | ICD-10-CM | POA: Insufficient documentation

## 2020-01-22 DIAGNOSIS — Z79899 Other long term (current) drug therapy: Secondary | ICD-10-CM | POA: Diagnosis not present

## 2020-01-22 DIAGNOSIS — Z90710 Acquired absence of both cervix and uterus: Secondary | ICD-10-CM | POA: Insufficient documentation

## 2020-01-22 DIAGNOSIS — Z923 Personal history of irradiation: Secondary | ICD-10-CM | POA: Diagnosis not present

## 2020-01-22 DIAGNOSIS — Z9221 Personal history of antineoplastic chemotherapy: Secondary | ICD-10-CM | POA: Insufficient documentation

## 2020-01-22 DIAGNOSIS — C7800 Secondary malignant neoplasm of unspecified lung: Secondary | ICD-10-CM | POA: Diagnosis not present

## 2020-01-22 NOTE — Assessment & Plan Note (Signed)
She has no further side effects from treatment She is not taking any pain medicine She tolerated radiation therapy very well I reviewed MRI imaging studies with the patient She has complete response to radiation therapy  Given disease relapse, we discussed the high risks of cancer recurrence.  Most patients would undergo systemic chemotherapy as precautionary measures again risk of systemic relapse However, in the past, she tolerated immunotherapy very poorly.  I doubt she can tolerate conventional chemotherapy. At this point in time, the risk of treatment outweighs the benefit For now, I recommend she follows very closely with GYN surgeon alternate with radiation oncologist every 3 months over the next few years for further follow-up If she is found to have systemic disease, I will see her back and will prescribe palliative treatment She is in agreement with the plan of care

## 2020-01-22 NOTE — Progress Notes (Signed)
[place] OFFICE PROGRESS NOTE  Patient Care Team: [name], MD as PCP - General (Internal Medicine) Jacelyn Pi, MD as Consulting Physician (Endocrinology) Heath Lark, MD as Consulting Physician (Hematology and Oncology)  ASSESSMENT & PLAN:  Endometrial cancer [facility]) She has no further side effects from treatment She is not taking any pain medicine She tolerated radiation therapy very well I reviewed MRI imaging studies with the patient She has complete response to radiation therapy  Given disease relapse, we discussed the high risks of cancer recurrence.  Most patients would undergo systemic chemotherapy as precautionary measures again risk of systemic relapse However, in the past, she tolerated immunotherapy very poorly.  I doubt she can tolerate conventional chemotherapy. At this point in time, the risk of treatment outweighs the benefit For now, I recommend she follows very closely with GYN surgeon alternate with radiation oncologist every 3 months over the next few years for further follow-up If she is found to have systemic disease, I will see her back and will prescribe palliative treatment She is in agreement with the plan of care   No orders of the defined types were placed in this encounter.   All questions were answered. The patient knows to call the clinic with any problems, questions or concerns. The total time spent in the appointment was 20 minutes encounter with patients including review of chart and various tests results, discussions about plan of care and coordination of care plan   Heath Lark, MD 01/22/2020 8:26 AM  INTERVAL HISTORY: Please see below for problem oriented charting. She returns for further follow-up and review of imaging studies She is doing well She is no longer taking pain medicine Denies abnormal vaginal discharge or bleeding.  SUMMARY OF ONCOLOGIC HISTORY: Oncology History Overview Note  Abnormal MSI, germline  mutation was negative Endometrioid   Endometrial cancer ([place])  09/15/2014 Pathology Results   Endometrium, curettage - ENDOMETRIAL ADENOCARCINOMA. - SEE COMMENT. Microscopic Comment Although definitive characterization is best performed on resection specimen, as sampled, the endometrial adenocarcinoma appears to be endometrioid subtype, FIGO grade 1.    09/15/2014 Surgery   PreOp: postmenopausal bleeding, cervical stenosis PostOp: same and uterine polyp Procedure:  Hysteroscopy, Dilation and Curettage, Myosure polypectomy Surgeon: Dr. Janyth Pupa  Findings:8cm uterus with thickened polypoid-like endometrium, large 2cm irregular appearing polyp  Specimens: 1) endometrial curettings   10/26/2014 Pathology Results   1. Uterus +/- tubes/ovaries, neoplastic - INVASIVE ADENOCARCINOMA, ENDOMETRIOID TYPE, SEE COMMENT. - TUMOR INVOLVES LESS THAN ONE HALF MYOMETRIAL THICKNESS. - TUMOR INVOLVES UTERINE ADENOMYOSIS. - BENIGN LEIOMYOMATA (UP TO 2.5 CM). - BENIGN CERVICAL MUCOSA; NEGATIVE FOR INTRAEPITHELIAL LESION OR MALIGNANCY. - BENIGN RIGHT AND LEFT OVARIES; NEGATIVE FOR ATYPIA OR MALIGNANCY. - BENIGN RIGHT AND LEFT FALLOPIAN TUBE; NEGATIVE FOR ATYPIA OR MALIGNANCY. - BENIGN PARATUBAL CYSTS; NEGATIVE FOR ATYPIA OR MALIGNANCY. - SEE TUMOR SYNOPTIC TEMPLATE BELOW. 2. Lymph nodes, regional resection, right pelvic - FOUR LYMPH NODES, NEGATIVE FOR TUMOR (0/4). 3. Lymph nodes, regional resection, left pelvic - FOUR LYMPH NODES, NEGATIVE FOR TUMOR (0/4). Microscopic Comment 1. ONCOLOGY TABLE-UTERUS, CARCINOMA OR CARCINOSARCOMA Specimen: Uterus and bilateral fallopian tubes and ovaries. Procedure: Hysterectomy and bilateral salpingo-oophorectomy. Lymph node sampling performed: Yes. Specimen integrity: Intact. Maximum tumor size: 4.0 cm (tumor involved entire endometrium) Histologic type: Adenocarcinoma, endometrioid type. Grade: 1 Myometrial invasion: 0.5 cm where myometrium is 1.5 cm in  thickness Cervical stromal involvement: Absent. Extent of involvement of other organs: None Lymph - vascular invasion: Absent. Peritoneal washings: N/A Lymph nodes: #  examined - 8 ; # positive - 0 Pelvic lymph nodes: N/A involved of N/A lymph nodes. Para-aortic lymph nodes: N/A involved of N/A lymph nodes. Other (specify involvement and site): N/A TNM code: pT1a, pN0 FIGO Stage (based on pathologic findings, needs clinical correlation): N/A Comments: None MSI testing abnormal   10/26/2014 Surgery   Surgery: Total robotic hysterectomy, bilateral salpingo-oophorectomy, bilateral pelvic lymph node dissection.   Surgeons:  Lucita Lora. Alycia Rossetti, MD; Lahoma Crocker, MD   Pathology: Uterus, cervix, bilateral tubes and ovaries, bilateral pelvic lymph nodes to pathology.   Operative findings: Omentum adherent to midline vertical incision. Fibroid uterus. Normal adnexa. Adhesions of anterior bladder to uterus. Frozen section with grade 1 cancer focally involving adenomyosis, minimal myometrial invasion.   11/17/2014 Imaging   Ct abdomen 1. Status post hysterectomy. Pelvic edema which is greater than typically seen 3 weeks postop. Suspicious for postoperative infection. 2. Bladder wall thickening and irregularity. Although this could be partially due to underdistention, Concurrent cystitis cannot be excluded. 3. Bilateral fluid density lesions along the pelvic sidewalls. Favored to represent seromas or lymphangiomas. No specific features to suggest abscess. If the patient's symptoms persist after appropriate antibiotic therapy, aspiration of the largest left pelvic sidewall "Lesion" should be considered. 4. Suspicion of mild hepatic steatosis. Indeterminate right hepatic lobe lesion. If definitive characterization is desired in this patient with history of primary malignancy, nonemergent pre and post contrast abdominal MRI could be performed. 5. Hiatal hernia.   11/25/2014 Imaging   1. Persistent  bilateral pelvic sidewall fluid collections, left greater than right, with left-sided inflammatory changes. 2. Technically successful 12 French left pelvic abscess drain catheter placement. A sample of the aspirate was sent for Gram stain, culture and sensitivity.   12/08/2014 Imaging   CT abdomen and pelvis 1. Complete resolution of dominant fluid collection within the left hemipelvis following percutaneous drainage catheter placement. This percutaneous drainage catheter was subsequent removed intact at the patient's bedside. 2. Continued decrease in size of bilobed fluid collections within the right hemipelvis with superficial component measuring 1.6 cm and posterior component measuring 2.6 cm, indeterminate though both favored to represent evolving seromas. 3. Unchanged indeterminate approximately 1.2 cm hepatic lesion. Further evaluation with contrast-enhanced abdominal MRI could be performed as clinically indicated. 4. Colonic diverticulosis without evidence of diverticulitis.   10/19/2016 Imaging   New 2.6 cm soft tissue mass involving right vaginal cuff, consistent with locally recurrent carcinoma.  No other sites of metastatic disease identified.  Colonic diverticulosis, without radiographic evidence of diverticulitis.  Mild hepatic steatosis.    10/24/2016 Relapse/Recurrence   Vaginal cuff recurrence. No evidence of distant disease.   10/24/2016 Pathology Results   Vagina, biopsy, right apex - ADENOCARCINOMA - SEE COMMENT Microscopic Comment The morphologic features are consistent with the patient's previously diagnosed endometrioid adenocarcinoma   11/08/2016 - 12/27/2016 Radiation Therapy   45 Gy in 25 fractions of 1.8 Gy. The residual pelvic mass was boosted to 14 Gy in 7 fractions of 2 Gy. No intracavitary lesion seen at completion of pelvic fields to boost with brachytherapy.   06/12/2017 Imaging   CT abdomen and pelvis 1. New 1.1 by 0.8 cm right lower lobe pulmonary  nodule, concerning for pulmonary metastatic disease. Possibilities for further assessment include biopsy or nuclear medicine PET-CT to assess this lesion and the rest of the neck/chest/abdomen/pelvis for other potential hypermetabolic lesions. 2. The previous soft tissue mass along the right vaginal cuff is no longer present. 3. Stable and likely benign right hepatic lobe  hypodense lesion. 4. Other imaging findings of potential clinical significance: Sigmoid colon diverticulosis. Aortic Atherosclerosis (ICD10-I70.0). Multilevel lumbar degenerative disc disease. Mild cardiomegaly. Small type 1 hiatal hernia.   07/05/2017 Imaging   Ct chest Several bilateral pulmonary nodules measuring up to 11 mm, highly suspicious for pulmonary metastases.   No evidence of lymphadenopathy or pleural effusion.  Aortic Atherosclerosis (ICD10-I70.0).   07/25/2017 Imaging   Status post CT-guided lung nodule biopsy. Tissue specimen sent to pathology for complete histopathologic analysis.   07/25/2017 Pathology Results   Lung, needle/core biopsy(ies), RLL - METASTATIC ADENOCARCINOMA - SEE COMMENT Microscopic Comment By immunohistochemistry, the neoplastic cells are positive for Pax-8 and ER but negative for TTF-1. Overall, this immunoprofile is consistent with metastasis from the patient's known endometrioid adenocarcinoma   07/30/2017 Cancer Staging   Staging form: Corpus Uteri - Carcinoma and Carcinosarcoma, AJCC 8th Edition - Clinical: Stage IVB (rcT1, cN0, pM1) - Signed by Heath Lark, MD on 07/30/2017   08/09/2017 - 11/21/2017 Chemotherapy   The patient had pembrolizumab for chemotherapy treatment.    10/10/2017 Imaging   1. Interval decrease in size and contour. The of previously described right and left lower lobe pulmonary nodules. 2. Interval increase in size of mediastinal and hilar adenopathy, potentially metastatic in etiology. 3. Aortic Atherosclerosis (ICD10-I70.0).   11/08/2017 - 11/10/2017 Hospital  Admission   She was admitted to the hospital due to uncontrolled pain   11/29/2017 Imaging   Chest Impression:  No thoracic metastasis. No discrete pulmonary nodules identified.  Abdomen / Pelvis Impression:  No evidence of local recurrence or metastatic endometrial carcinoma in the abdomen pelvis.   02/21/2018 Imaging   1. No findings to suggest metastatic disease in the chest, abdomen or pelvis. 2. Mild colonic diverticulosis without evidence of acute diverticulitis at this time. 3. Aortic atherosclerosis. 4. Additional incidental findings, as above.   03/10/2018 Imaging   Bone scan: No scintigraphic evidence of osseous metastatic disease.   05/22/2018 Imaging   1. Stable exam. No evidence of recurrent or metastatic carcinoma within the chest, abdomen, or pelvis. 2. Colonic diverticulosis, without radiographic evidence of diverticulitis.   11/13/2018 Imaging   1. No evidence of recurrent or metastatic carcinoma within the chest, abdomen, or pelvis. 2. Colonic diverticulosis, without radiographic evidence of diverticulitis. 3. Stable hepatic steatosis.   Aortic Atherosclerosis (ICD10-I70.0).   05/22/2019 Imaging   1. No evidence of recurrent or metastatic disease in the chest, abdomen, or pelvis status post hysterectomy. 2. Previously seen metastatic pulmonary nodules in the bilateral lower lobes remain resolved. 3. Unchanged irregular opacities of the peripheral right upper lobe, likely chronic sequelae of prior infection or inflammation. 4. Hepatic steatosis. 5. Sigmoid diverticulosis. 6. Aortic Atherosclerosis (ICD10-I70.0).     10/18/2019 Imaging   1. Heterogeneously enhancing lesion measuring 2.4 x 1.9 x 1.9 cm immediately to the right of the urethra along the anterolateral aspect of the vaginal wall, likely centered in the right levator ani 9 musculature. This has a malignant appearance, but whether this represents a new primary lesion or a metastatic lesion is uncertain  on today's examination. 2. Mild colonic diverticulosis.     11/02/2019 Imaging   CT chest 1. No evidence of metastatic disease. 2. Hepatic steatosis. 3.  Aortic atherosclerosis (ICD10-I70.0).   11/10/2019 - 11/27/2019 Radiation Therapy   Radiation Treatment Dates: 11/10/2019 through 11/27/2019 Site Technique Total Dose (Gy) Dose per Fx (Gy) Completed Fx Beam Energies  Perineum: Pelvis IMRT 28/28 2 14/14 6X      01/21/2020  Imaging   1. Complete resolution of the heterogeneously enhancing lesion seen along the right vaginal introitus on the prior study. No residual measurable lesion at this location today. No new or progressive findings in the pelvis/pelvic floor on today's exam. 2. Left colonic diverticulosis without features of diverticulitis.   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    Metastasis to lung ([place])  07/30/2017 Initial Diagnosis   Metastasis to lung ([place])   08/09/2017 - 11/01/2017 Chemotherapy   The patient had pembrolizumab (KEYTRUDA) 200 mg in sodium chloride 0.9 % 50 mL chemo infusion, 200 mg, Intravenous, Once, 5 of 7 cycles Administration: 200 mg (08/09/2017), 200 mg (08/30/2017), 200 mg (09/20/2017), 200 mg (10/11/2017), 200 mg (11/01/2017)  for chemotherapy treatment.      REVIEW OF SYSTEMS:   Constitutional: Denies fevers, chills or abnormal weight loss Eyes: Denies blurriness of vision Ears, nose, mouth, throat, and face: Denies mucositis or sore throat Respiratory: Denies cough, dyspnea or wheezes Cardiovascular: Denies palpitation, chest discomfort or lower extremity  swelling Gastrointestinal:  Denies nausea, heartburn or change in bowel habits Skin: Denies abnormal skin rashes Lymphatics: Denies new lymphadenopathy or easy bruising Neurological:Denies numbness, tingling or new weaknesses Behavioral/Psych: Mood is stable, no new changes  All other systems were reviewed with the patient and are negative.  I have reviewed the past medical history, past surgical history, social history and family history with the patient and they are unchanged from previous note.  ALLERGIES:  is allergic to oxycodone and hydrocodone.  MEDICATIONS:  Current Outpatient Medications  Medication Sig Dispense Refill  . Cholecalciferol (VITAMIN D) 50 MCG (2000 UT) CAPS Take 2,000 Units by mouth daily.     Marland Kitchen FLUoxetine (PROZAC) 40 MG capsule Take 40 mg by mouth daily.    Marland Kitchen SYNTHROID 100 MCG tablet Take 100 mcg by mouth daily before breakfast.      No current facility-administered medications for this visit.    PHYSICAL EXAMINATION: ECOG PERFORMANCE STATUS: 0 - Asymptomatic  Vitals:   01/22/20 0803  BP: 128/67  Pulse: 87  Resp: 18  Temp: (!) 97.3 F (36.3 C)  SpO2: 98%   Filed Weights   01/22/20 0803  Weight: 170 lb 6.4 oz (77.3 kg)    GENERAL:alert, no distress and comfortable NEURO: alert & oriented x 3 with fluent speech, no focal motor/sensory deficits  LABORATORY DATA:  I have reviewed the data as listed    Component Value Date/Time   NA 143 10/15/2019 1507   K 4.0 10/15/2019 1507   CL 103 10/15/2019 1507   CO2 28 10/15/2019 1507   GLUCOSE 89 10/15/2019 1507   BUN 12 10/15/2019 1507   BUN 18.7 10/17/2016 1353   CREATININE 0.75 10/15/2019 1507   CREATININE 0.8 10/17/2016 1353   CALCIUM 9.6 10/15/2019 1507   PROT 7.2 10/15/2019 1507   ALBUMIN 4.1 10/15/2019 1507   AST 16 10/15/2019 1507   ALT 19 10/15/2019 1507   ALKPHOS 54 10/15/2019 1507   BILITOT 0.7 10/15/2019 1507   GFRNONAA >60 10/15/2019 1507   GFRAA >60 10/15/2019 1507    No  results found for: SPEP, UPEP  Lab Results  Component Value Date   WBC 6.2 10/15/2019   NEUTROABS 4.4 10/15/2019   HGB 13.9 10/15/2019   HCT 41.9 10/15/2019   MCV 92.1 10/15/2019   PLT 268 10/15/2019      Chemistry      Component Value Date/Time   NA 143 10/15/2019 1507   K 4.0 10/15/2019 1507   CL 103 10/15/2019 1507   CO2 28 10/15/2019 1507   BUN 12 10/15/2019 1507   BUN 18.7 10/17/2016 1353   CREATININE 0.75 10/15/2019 1507   CREATININE 0.8 10/17/2016 1353      Component Value Date/Time   CALCIUM 9.6 10/15/2019 1507   ALKPHOS 54 10/15/2019 1507   AST 16 10/15/2019 1507   ALT 19 10/15/2019 1507   BILITOT 0.7 10/15/2019 1507       RADIOGRAPHIC STUDIES: I reviewed multiple imaging studies with the patient I have personally reviewed the radiological images as listed and agreed with the findings in the report. MR Pelvis W Wo Contrast  Result Date: 01/21/2020 CLINICAL DATA:  Uterine cancer. Restaging. Enhancing lesion identified along the anterolateral aspect of the right vaginal wall previously. EXAM: MRI PELVIS WITHOUT AND WITH CONTRAST TECHNIQUE: Multiplanar multisequence MR imaging of the pelvis was performed both before and after administration of intravenous contrast. CONTRAST:  75m GADAVIST GADOBUTROL 1 MMOL/ML IV SOLN COMPARISON:  10/17/2019. FINDINGS: Urinary Tract:  No abnormality visualized. Bowel: Left colonic diverticulosis without features of diverticulitis. Vascular/Lymphatic: No pathologically enlarged lymph nodes. No significant vascular abnormality seen. Reproductive: Uterus surgically absent. No adnexal mass. The 2.4 cm heterogeneously enhancing lesion seen along the right vaginal introitus previously has resolved completely in the interval with no residual measurable lesion at this location today. No new suspicious soft tissue abnormality identified in the pelvis/pelvic floor on today's exam. Other:  No intraperitoneal free fluid. Musculoskeletal: No focal  suspicious marrow enhancement within the visualized bony anatomy. IMPRESSION: 1. Complete resolution of the heterogeneously enhancing lesion seen along the right vaginal introitus on the prior study. No residual measurable lesion at this location today. No new or progressive findings in the pelvis/pelvic floor on today's exam. 2. Left colonic diverticulosis without features of diverticulitis. Electronically Signed   By: EMisty StanleyM.D.   On: 01/21/2020 09:47

## 2020-02-04 ENCOUNTER — Telehealth: Payer: Self-pay | Admitting: Oncology

## 2020-02-04 NOTE — Telephone Encounter (Signed)
Ashley Villegas called and needed to reschedule her appointment with Dr. Berline Lopes on 02/26/20.  Appointment rescheduled to 03/02/20 at 2:30.

## 2020-02-26 ENCOUNTER — Ambulatory Visit: Payer: 59 | Admitting: Gynecologic Oncology

## 2020-03-01 ENCOUNTER — Encounter: Payer: Self-pay | Admitting: Gynecologic Oncology

## 2020-03-01 NOTE — Progress Notes (Signed)
Gynecologic Oncology Return Clinic Visit  03/02/20  Reason for Visit: f/u visit after treatment for recurrent endometrial cancer  Treatment History: Oncology History Overview Note  Abnormal MSI, germline mutation was negative Endometrioid   Endometrial cancer ([place])  09/15/2014 Pathology Results   Endometrium, curettage - ENDOMETRIAL ADENOCARCINOMA. - SEE COMMENT. Microscopic Comment Although definitive characterization is best performed on resection specimen, as sampled, the endometrial adenocarcinoma appears to be endometrioid subtype, FIGO grade 1.    09/15/2014 Surgery   PreOp: postmenopausal bleeding, cervical stenosis PostOp: same and uterine polyp Procedure:  Hysteroscopy, Dilation and Curettage, Myosure polypectomy Surgeon: Dr. Janyth Pupa  Findings:8cm uterus with thickened polypoid-like endometrium, large 2cm irregular appearing polyp  Specimens: 1) endometrial curettings   10/26/2014 Pathology Results   1. Uterus +/- tubes/ovaries, neoplastic - INVASIVE ADENOCARCINOMA, ENDOMETRIOID TYPE, SEE COMMENT. - TUMOR INVOLVES LESS THAN ONE HALF MYOMETRIAL THICKNESS. - TUMOR INVOLVES UTERINE ADENOMYOSIS. - BENIGN LEIOMYOMATA (UP TO 2.5 CM). - BENIGN CERVICAL MUCOSA; NEGATIVE FOR INTRAEPITHELIAL LESION OR MALIGNANCY. - BENIGN RIGHT AND LEFT OVARIES; NEGATIVE FOR ATYPIA OR MALIGNANCY. - BENIGN RIGHT AND LEFT FALLOPIAN TUBE; NEGATIVE FOR ATYPIA OR MALIGNANCY. - BENIGN PARATUBAL CYSTS; NEGATIVE FOR ATYPIA OR MALIGNANCY. - SEE TUMOR SYNOPTIC TEMPLATE BELOW. 2. Lymph nodes, regional resection, right pelvic - FOUR LYMPH NODES, NEGATIVE FOR TUMOR (0/4). 3. Lymph nodes, regional resection, left pelvic - FOUR LYMPH NODES, NEGATIVE FOR TUMOR (0/4). Microscopic Comment 1. ONCOLOGY TABLE-UTERUS, CARCINOMA OR CARCINOSARCOMA Specimen: Uterus and bilateral fallopian tubes and ovaries. Procedure: Hysterectomy and bilateral salpingo-oophorectomy. Lymph node sampling performed:  Yes. Specimen integrity: Intact. Maximum tumor size: 4.0 cm (tumor involved entire endometrium) Histologic type: Adenocarcinoma, endometrioid type. Grade: 1 Myometrial invasion: 0.5 cm where myometrium is 1.5 cm in thickness Cervical stromal involvement: Absent. Extent of involvement of other organs: None Lymph - vascular invasion: Absent. Peritoneal washings: N/A Lymph nodes: # examined - 8 ; # positive - 0 Pelvic lymph nodes: N/A involved of N/A lymph nodes. Para-aortic lymph nodes: N/A involved of N/A lymph nodes. Other (specify involvement and site): N/A TNM code: pT1a, pN0 FIGO Stage (based on pathologic findings, needs clinical correlation): N/A Comments: None MSI testing abnormal   10/26/2014 Surgery   Surgery: Total robotic hysterectomy, bilateral salpingo-oophorectomy, bilateral pelvic lymph node dissection.   Surgeons:  Lucita Lora. Alycia Rossetti, MD; Lahoma Crocker, MD   Pathology: Uterus, cervix, bilateral tubes and ovaries, bilateral pelvic lymph nodes to pathology.   Operative findings: Omentum adherent to midline vertical incision. Fibroid uterus. Normal adnexa. Adhesions of anterior bladder to uterus. Frozen section with grade 1 cancer focally involving adenomyosis, minimal myometrial invasion.   11/17/2014 Imaging   Ct abdomen 1. Status post hysterectomy. Pelvic edema which is greater than typically seen 3 weeks postop. Suspicious for postoperative infection. 2. Bladder wall thickening and irregularity. Although this could be partially due to underdistention, Concurrent cystitis cannot be excluded. 3. Bilateral fluid density lesions along the pelvic sidewalls. Favored to represent seromas or lymphangiomas. No specific features to suggest abscess. If the patient's symptoms persist after appropriate antibiotic therapy, aspiration of the largest left pelvic sidewall "Lesion" should be considered. 4. Suspicion of mild hepatic steatosis. Indeterminate right hepatic lobe  lesion. If definitive characterization is desired in this patient with history of primary malignancy, nonemergent pre and post contrast abdominal MRI could be performed. 5. Hiatal hernia.   11/25/2014 Imaging   1. Persistent bilateral pelvic sidewall fluid collections, left greater than right, with left-sided inflammatory changes. 2. Technically successful 12 French left pelvic abscess  drain catheter placement. A sample of the aspirate was sent for Gram stain, culture and sensitivity.   12/08/2014 Imaging   CT abdomen and pelvis 1. Complete resolution of dominant fluid collection within the left hemipelvis following percutaneous drainage catheter placement. This percutaneous drainage catheter was subsequent removed intact at the patient's bedside. 2. Continued decrease in size of bilobed fluid collections within the right hemipelvis with superficial component measuring 1.6 cm and posterior component measuring 2.6 cm, indeterminate though both favored to represent evolving seromas. 3. Unchanged indeterminate approximately 1.2 cm hepatic lesion. Further evaluation with contrast-enhanced abdominal MRI could be performed as clinically indicated. 4. Colonic diverticulosis without evidence of diverticulitis.   10/19/2016 Imaging   New 2.6 cm soft tissue mass involving right vaginal cuff, consistent with locally recurrent carcinoma.  No other sites of metastatic disease identified.  Colonic diverticulosis, without radiographic evidence of diverticulitis.  Mild hepatic steatosis.    10/24/2016 Relapse/Recurrence   Vaginal cuff recurrence. No evidence of distant disease.   10/24/2016 Pathology Results   Vagina, biopsy, right apex - ADENOCARCINOMA - SEE COMMENT Microscopic Comment The morphologic features are consistent with the patient's previously diagnosed endometrioid adenocarcinoma   11/08/2016 - 12/27/2016 Radiation Therapy   45 Gy in 25 fractions of 1.8 Gy. The residual pelvic mass was  boosted to 14 Gy in 7 fractions of 2 Gy. No intracavitary lesion seen at completion of pelvic fields to boost with brachytherapy.   06/12/2017 Imaging   CT abdomen and pelvis 1. New 1.1 by 0.8 cm right lower lobe pulmonary nodule, concerning for pulmonary metastatic disease. Possibilities for further assessment include biopsy or nuclear medicine PET-CT to assess this lesion and the rest of the neck/chest/abdomen/pelvis for other potential hypermetabolic lesions. 2. The previous soft tissue mass along the right vaginal cuff is no longer present. 3. Stable and likely benign right hepatic lobe hypodense lesion. 4. Other imaging findings of potential clinical significance: Sigmoid colon diverticulosis. Aortic Atherosclerosis (ICD10-I70.0). Multilevel lumbar degenerative disc disease. Mild cardiomegaly. Small type 1 hiatal hernia.   07/05/2017 Imaging   Ct chest Several bilateral pulmonary nodules measuring up to 11 mm, highly suspicious for pulmonary metastases.   No evidence of lymphadenopathy or pleural effusion.  Aortic Atherosclerosis (ICD10-I70.0).   07/25/2017 Imaging   Status post CT-guided lung nodule biopsy. Tissue specimen sent to pathology for complete histopathologic analysis.   07/25/2017 Pathology Results   Lung, needle/core biopsy(ies), RLL - METASTATIC ADENOCARCINOMA - SEE COMMENT Microscopic Comment By immunohistochemistry, the neoplastic cells are positive for Pax-8 and ER but negative for TTF-1. Overall, this immunoprofile is consistent with metastasis from the patient's known endometrioid adenocarcinoma   07/30/2017 Cancer Staging   Staging form: Corpus Uteri - Carcinoma and Carcinosarcoma, AJCC 8th Edition - Clinical: Stage IVB (rcT1, cN0, pM1) - Signed by Heath Lark, MD on 07/30/2017   08/09/2017 - 11/21/2017 Chemotherapy   The patient had pembrolizumab for chemotherapy treatment.    10/10/2017 Imaging   1. Interval decrease in size and contour. The of previously described  right and left lower lobe pulmonary nodules. 2. Interval increase in size of mediastinal and hilar adenopathy, potentially metastatic in etiology. 3. Aortic Atherosclerosis (ICD10-I70.0).   11/08/2017 - 11/10/2017 Hospital Admission   She was admitted to the hospital due to uncontrolled pain   11/29/2017 Imaging   Chest Impression:  No thoracic metastasis. No discrete pulmonary nodules identified.  Abdomen / Pelvis Impression:  No evidence of local recurrence or metastatic endometrial carcinoma in the abdomen pelvis.  02/21/2018 Imaging   1. No findings to suggest metastatic disease in the chest, abdomen or pelvis. 2. Mild colonic diverticulosis without evidence of acute diverticulitis at this time. 3. Aortic atherosclerosis. 4. Additional incidental findings, as above.   03/10/2018 Imaging   Bone scan: No scintigraphic evidence of osseous metastatic disease.   05/22/2018 Imaging   1. Stable exam. No evidence of recurrent or metastatic carcinoma within the chest, abdomen, or pelvis. 2. Colonic diverticulosis, without radiographic evidence of diverticulitis.   11/13/2018 Imaging   1. No evidence of recurrent or metastatic carcinoma within the chest, abdomen, or pelvis. 2. Colonic diverticulosis, without radiographic evidence of diverticulitis. 3. Stable hepatic steatosis.   Aortic Atherosclerosis (ICD10-I70.0).   05/22/2019 Imaging   1. No evidence of recurrent or metastatic disease in the chest, abdomen, or pelvis status post hysterectomy. 2. Previously seen metastatic pulmonary nodules in the bilateral lower lobes remain resolved. 3. Unchanged irregular opacities of the peripheral right upper lobe, likely chronic sequelae of prior infection or inflammation. 4. Hepatic steatosis. 5. Sigmoid diverticulosis. 6. Aortic Atherosclerosis (ICD10-I70.0).     10/18/2019 Imaging   1. Heterogeneously enhancing lesion measuring 2.4 x 1.9 x 1.9 cm immediately to the right of the urethra  along the anterolateral aspect of the vaginal wall, likely centered in the right levator ani 9 musculature. This has a malignant appearance, but whether this represents a new primary lesion or a metastatic lesion is uncertain on today's examination. 2. Mild colonic diverticulosis.     11/02/2019 Imaging   CT chest 1. No evidence of metastatic disease. 2. Hepatic steatosis. 3.  Aortic atherosclerosis (ICD10-I70.0).   11/10/2019 - 11/27/2019 Radiation Therapy   Radiation Treatment Dates: 11/10/2019 through 11/27/2019 Site Technique Total Dose (Gy) Dose per Fx (Gy) Completed Fx Beam Energies  Perineum: Pelvis IMRT 28/28 2 14/14 6X      01/21/2020 Imaging   1. Complete resolution of the heterogeneously enhancing lesion seen along the right vaginal introitus on the prior study. No residual measurable lesion at this location today. No new or progressive findings in the pelvis/pelvic floor on today's exam. 2. Left colonic diverticulosis without features of diverticulitis.   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    Metastasis to lung ([place])  07/30/2017 Initial Diagnosis   Metastasis to lung ([place])   08/09/2017 - 11/01/2017 Chemotherapy   The patient had pembrolizumab (KEYTRUDA) 200 mg in sodium chloride 0.9 % 50 mL chemo infusion, 200 mg, Intravenous, Once, 5 of 7 cycles Administration: 200 mg (08/09/2017), 200 mg (08/30/2017), 200 mg (09/20/2017), 200 mg (10/11/2017), 200 mg (11/01/2017)  for chemotherapy treatment.      Interval History: Burman Nieves was initially diagnosed in 2016. Treated for pelvic recurrence in 2019 with XRT and  then [facility] for lung recurrence. Most recently, she was found to have right vaginal and peri-urethral lesion c/w metastatic disease. She completed radiation with 28Gy in early September and most recent imaging showed complete resolution of the mass (no residual or measurable disease).   Saw Dr. Sondra Come last on 10/7 after completing IMRT. Was doing well at that time. Pain (presenting symptom) had completely resolved.   She presents today  overall doing very well.  She denies any pelvic or vaginal pain.  She denies vaginal bleeding or discharge.  She has some mild fatigue, at baseline, and unchanged with treatment.  She endorses a good appetite without any nausea, emesis, or recent weight changes.  She reports regular bowel function.  She has some urinary frequency but attributes this to drinking a lot of water.  She endorses feeling like she is retaining some fluid in her ankles and hands.  She had this at some point in the past related to thyroid issues.  Past Medical/Surgical History: Past Medical History:  Diagnosis Date  . #[id number] dx'd 2016, 09/2019   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, recurrent   . Anxiety   . Arthritis   . Carpal tunnel syndrome, bilateral upper limbs   . Complication of anesthesia   . Depression   . Family history of uterine cancer   . GERD (gastroesophageal reflux disease)   . History of radiation therapy 11/08/2016-12/27/2016   pelvis 45 Gy in 25 fractions, pelvis boost 14 gy in 7 fractions  . Hypothyroidism    on synthroid   . PONV (postoperative nausea and vomiting)     Past Surgical History:  Procedure Laterality Date  . CARPAL TUNNEL RELEASE Bilateral 2020  . CESAREAN SECTION    . COLONOSCOPY    . DIAGNOSTIC LAPAROSCOPY     fibroid tumors on her ovaries   . DILATION AND CURETTAGE OF UTERUS    . HAND SURGERY Right 2019   carpal tunnel  . HYSTEROSCOPY WITH D & C N/A 09/15/2014   Procedure: DILATATION AND CURETTAGE /HYSTEROSCOPY/MYOSURE RESECTION OF POLYP;   Surgeon: Janyth Pupa, DO;  Location: [facility];  Service: Gynecology;  Laterality: N/A;  . KNEE SURGERY    . LYMPH NODE DISSECTION N/A 10/26/2014   Procedure: POSS LYMPHADENECTOMY ;  Surgeon: [name], MD;  Location: [facility];  Service: Gynecology;  Laterality: N/A;  . ROBOTIC ASSISTED TOTAL HYSTERECTOMY WITH BILATERAL SALPINGO OOPHERECTOMY Bilateral 10/26/2014   Procedure: ROBOTIC ASSISTED TOTAL HYSTERECTOMY WITH BILATERAL SALPINGO OOPHORECTOMY;  Surgeon: [name], MD;  Location: [facility];  Service: Gynecology;  Laterality: Bilateral;  . TOTAL KNEE ARTHROPLASTY Left 04/15/2019   Procedure: LEFT TOTAL KNEE ARTHROPLASTY;  Surgeon: Melrose Nakayama, MD;  Location: [facility];  Service: Orthopedics;  Laterality: Left;  . TUBAL LIGATION    . WISDOM TOOTH EXTRACTION      Family History  Problem Relation Age of Onset  . Breast cancer Maternal Aunt        dx late 50's/60's, died in 83's  . Uterine cancer Mother 26  . Heart failure Mother   . Dementia Father   . Heart attack Paternal Grandfather 80  . Other Sister 66       had abnromal finding on mammogram, unsure if cancer. Was told to 'remove part of of body'    Social History   Socioeconomic History  . Marital status: Single    Spouse name: Not on file  . Number of children: 2  . Years of education: Not on file  . Highest education level: Not on file  Occupational History  . Occupation: customer service  Tobacco Use  . Smoking status: Former Smoker    Packs/day: 0.50    Years: 13.00    Pack years: 6.50    Types: Cigarettes    Quit date: 10/20/2007    Years since quitting: 12.3  . Smokeless tobacco: Never Used  Vaping Use  . Vaping Use:  Never used  Substance and Sexual Activity  . Alcohol use: Yes    Alcohol/week: 1.0 standard drink    Types: 1 Cans of beer per week    Comment: every 2 to 3 months  . Drug use: No  . Sexual activity: Not on file  Other Topics Concern  . Not on file  Social History Narrative  . Not on file    Social Determinants of Health   Financial Resource Strain:   . Difficulty of Paying Living Expenses: Not on file  Food Insecurity:   . Worried About Charity fundraiser in the Last Year: Not on file  . Ran Out of Food in the Last Year: Not on file  Transportation Needs:   . Lack of Transportation (Medical): Not on file  . Lack of Transportation (Non-Medical): Not on file  Physical Activity:   . Days of Exercise per Week: Not on file  . Minutes of Exercise per Session: Not on file  Stress:   . Feeling of Stress : Not on file  Social Connections:   . Frequency of Communication with Friends and Family: Not on file  . Frequency of Social Gatherings with Friends and Family: Not on file  . Attends Religious Services: Not on file  . Active Member of Clubs or Organizations: Not on file  . Attends Archivist Meetings: Not on file  . Marital Status: Not on file    Current Medications:  Current Outpatient Medications:  .  Cholecalciferol (VITAMIN D3) 25 MCG (1000 UT) CAPS, Take 1 capsule by mouth daily., Disp: , Rfl:  .  FLUoxetine (PROZAC) 40 MG capsule, Take 40 mg by mouth daily., Disp: , Rfl:  .  SYNTHROID 100 MCG tablet, Take 100 mcg by mouth daily before breakfast. , Disp: , Rfl:   Review of Systems: + Hot flashes, anxiety, depression. Denies appetite changes, fevers, chills, fatigue, unexplained weight changes. Denies hearing loss, neck lumps or masses, mouth sores, ringing in ears or voice changes. Denies cough or wheezing.  Denies shortness of breath. Denies chest pain or palpitations. Denies leg swelling. Denies abdominal distention, pain, blood in stools, constipation, diarrhea, nausea, vomiting, or early satiety. Denies pain with intercourse, dysuria, frequency, hematuria or incontinence. Denies pelvic pain, vaginal bleeding or vaginal discharge.   Denies joint pain, back pain or muscle pain/cramps. Denies itching, rash, or wounds. Denies dizziness, headaches,  numbness or seizures. Denies swollen lymph nodes or glands, denies easy bruising or bleeding. Denies confusion, or decreased concentration.  Physical Exam: BP 116/60 (BP Location: Left Arm, Patient Position: Sitting)   Pulse 68   Temp (!) 97.2 F (36.2 C) (Tympanic)   Resp 18   Ht 5' (1.524 m)   Wt 171 lb (77.6 kg)   SpO2 100% Comment: RA  BMI 33.40 kg/m  General: Alert, oriented, no acute distress. HEENT: Normocephalic, atraumatic, sclera anicteric. Chest: Unlabored breathing on room air. Abdomen: Obese, soft, nontender.  Normoactive bowel sounds.  No masses or hepatosplenomegaly appreciated.  Well-healed incisions. Extremities: Grossly normal range of motion.  Warm, well perfused.  Trace edema bilaterally in ankles. Skin: No rashes or lesions noted. Lymphatics: No cervical, supraclavicular, or inguinal adenopathy. GU: Normal appearing external genitalia without erythema, excoriation, or lesions.  Speculum exam reveals moderately atrophic vaginal mucosa.  No lesions, bleeding, or discharge.  Bimanual exam reveals cuff intact.  No masses, bilateral sidewalls free of nodularity, no pain on palpation.  Rectovaginal exam confirms these findings.  Laboratory & Radiologic Studies:  MRI pelvis 10/28: 1. Complete resolution of the heterogeneously enhancing lesion seen along the right vaginal introitus on the prior study. No residual measurable lesion at this location today. No new or progressive findings in the pelvis/pelvic floor on today's exam. 2. Left colonic diverticulosis without features of diverticulitis.  Assessment & Plan: Ashley Villegas is a 69 y.o. woman with recurrent uterine cancer, most recently treated for para-vaginal lesion with RT with complete disease resolution, presenting for surveillance visit.  The patient did very well with recent treatment for recurrent disease without significant side effects.  She has had complete resolution of her pain.  She is NED on  exam today and recent MRI imaging after completion of radiation showed resolution of her vaginal sidewall lesion.  We discussed plan for surveillance including visits every 3 months, alternating between radiation oncology and our clinic.  We also reviewed signs and symptoms that would be concerning for disease recurrence and the patient knows to call the clinic if she develops any of these before her next scheduled visit.  In terms of her reported fluid retention, I encouraged the patient to call her primary care provider for management.  25 minutes of total time was spent for this patient encounter, including preparation, face-to-face counseling with the patient and coordination of care, and documentation of the encounter.  Jeral Pinch, MD  Division of Gynecologic Oncology  Department of Obstetrics and Gynecology  [facility]

## 2020-03-02 ENCOUNTER — Inpatient Hospital Stay: Payer: Medicare Other | Attending: Hematology and Oncology | Admitting: Gynecologic Oncology

## 2020-03-02 ENCOUNTER — Other Ambulatory Visit: Payer: Self-pay

## 2020-03-02 ENCOUNTER — Encounter: Payer: Self-pay | Admitting: Gynecologic Oncology

## 2020-03-02 VITALS — BP 116/60 | HR 68 | Temp 97.2°F | Resp 18 | Ht 60.0 in | Wt 171.0 lb

## 2020-03-02 DIAGNOSIS — C541 Malignant neoplasm of endometrium: Secondary | ICD-10-CM | POA: Insufficient documentation

## 2020-03-02 DIAGNOSIS — Z79899 Other long term (current) drug therapy: Secondary | ICD-10-CM | POA: Insufficient documentation

## 2020-03-02 DIAGNOSIS — E039 Hypothyroidism, unspecified: Secondary | ICD-10-CM | POA: Diagnosis not present

## 2020-03-02 DIAGNOSIS — Z923 Personal history of irradiation: Secondary | ICD-10-CM | POA: Diagnosis not present

## 2020-03-02 DIAGNOSIS — F419 Anxiety disorder, unspecified: Secondary | ICD-10-CM | POA: Diagnosis not present

## 2020-03-02 DIAGNOSIS — Z90710 Acquired absence of both cervix and uterus: Secondary | ICD-10-CM | POA: Diagnosis not present

## 2020-03-02 DIAGNOSIS — C7982 Secondary malignant neoplasm of genital organs: Secondary | ICD-10-CM | POA: Insufficient documentation

## 2020-03-02 DIAGNOSIS — F32A Depression, unspecified: Secondary | ICD-10-CM | POA: Diagnosis not present

## 2020-03-02 DIAGNOSIS — Z9221 Personal history of antineoplastic chemotherapy: Secondary | ICD-10-CM | POA: Diagnosis not present

## 2020-03-02 DIAGNOSIS — C7800 Secondary malignant neoplasm of unspecified lung: Secondary | ICD-10-CM | POA: Diagnosis not present

## 2020-03-02 DIAGNOSIS — Z90722 Acquired absence of ovaries, bilateral: Secondary | ICD-10-CM | POA: Insufficient documentation

## 2020-03-02 DIAGNOSIS — Z87891 Personal history of nicotine dependence: Secondary | ICD-10-CM | POA: Diagnosis not present

## 2020-03-02 NOTE — Patient Instructions (Signed)
I am glad you are feeling so well after treatment!  Your exam is normal today.  I will see you back in 6 months.  Please call if you develop any new or concerning symptoms before that.

## 2020-03-31 ENCOUNTER — Telehealth: Payer: Self-pay

## 2020-03-31 NOTE — Telephone Encounter (Signed)
She called and left a message to call her.  Called back. Her grandson tested positive today for covid. They have been in close contact. Asking if she should be tested? Told her to get tested for covid today. She verbalized understanding. She will call the office back with results of test.  Just FYI

## 2020-04-02 ENCOUNTER — Other Ambulatory Visit: Payer: Medicare Other

## 2020-04-02 DIAGNOSIS — Z20822 Contact with and (suspected) exposure to covid-19: Secondary | ICD-10-CM | POA: Diagnosis not present

## 2020-04-05 LAB — NOVEL CORONAVIRUS, NAA: SARS-CoV-2, NAA: NOT DETECTED

## 2020-05-14 ENCOUNTER — Encounter (HOSPITAL_COMMUNITY): Payer: Self-pay | Admitting: Emergency Medicine

## 2020-05-14 ENCOUNTER — Emergency Department (HOSPITAL_COMMUNITY): Payer: Medicare Other

## 2020-05-14 ENCOUNTER — Emergency Department (HOSPITAL_COMMUNITY)
Admission: EM | Admit: 2020-05-14 | Discharge: 2020-05-14 | Disposition: A | Discharge status: Home / Self Care | Payer: Medicare Other | Attending: Emergency Medicine | Admitting: Emergency Medicine

## 2020-05-14 ENCOUNTER — Other Ambulatory Visit: Payer: Self-pay

## 2020-05-14 DIAGNOSIS — U071 COVID-19: Secondary | ICD-10-CM | POA: Insufficient documentation

## 2020-05-14 DIAGNOSIS — R0602 Shortness of breath: Secondary | ICD-10-CM | POA: Diagnosis not present

## 2020-05-14 DIAGNOSIS — Z79899 Other long term (current) drug therapy: Secondary | ICD-10-CM | POA: Insufficient documentation

## 2020-05-14 DIAGNOSIS — Z8542 Personal history of malignant neoplasm of other parts of uterus: Secondary | ICD-10-CM | POA: Diagnosis not present

## 2020-05-14 DIAGNOSIS — E039 Hypothyroidism, unspecified: Secondary | ICD-10-CM | POA: Diagnosis not present

## 2020-05-14 DIAGNOSIS — Z87891 Personal history of nicotine dependence: Secondary | ICD-10-CM | POA: Diagnosis not present

## 2020-05-14 DIAGNOSIS — Z96652 Presence of left artificial knee joint: Secondary | ICD-10-CM | POA: Insufficient documentation

## 2020-05-14 DIAGNOSIS — R0789 Other chest pain: Secondary | ICD-10-CM | POA: Diagnosis present

## 2020-05-14 DIAGNOSIS — R079 Chest pain, unspecified: Secondary | ICD-10-CM

## 2020-05-14 LAB — HEPATIC FUNCTION PANEL
ALT: 24 U/L (ref 0–44)
AST: 22 U/L (ref 15–41)
Albumin: 3.9 g/dL (ref 3.5–5.0)
Alkaline Phosphatase: 56 U/L (ref 38–126)
Bilirubin, Direct: 0.1 mg/dL (ref 0.0–0.2)
Indirect Bilirubin: 0.6 mg/dL (ref 0.3–0.9)
Total Bilirubin: 0.7 mg/dL (ref 0.3–1.2)
Total Protein: 6.9 g/dL (ref 6.5–8.1)

## 2020-05-14 LAB — CBC
HCT: 42.7 % (ref 36.0–46.0)
Hemoglobin: 14 g/dL (ref 12.0–15.0)
MCH: 30.7 pg (ref 26.0–34.0)
MCHC: 32.8 g/dL (ref 30.0–36.0)
MCV: 93.6 fL (ref 80.0–100.0)
Platelets: 231 10*3/uL (ref 150–400)
RBC: 4.56 MIL/uL (ref 3.87–5.11)
RDW: 13.6 % (ref 11.5–15.5)
WBC: 3.4 10*3/uL — ABNORMAL LOW (ref 4.0–10.5)
nRBC: 0 % (ref 0.0–0.2)

## 2020-05-14 LAB — LIPASE, BLOOD: Lipase: 30 U/L (ref 11–51)

## 2020-05-14 LAB — BASIC METABOLIC PANEL
Anion gap: 11 (ref 5–15)
BUN: 14 mg/dL (ref 8–23)
CO2: 24 mmol/L (ref 22–32)
Calcium: 9 mg/dL (ref 8.9–10.3)
Chloride: 104 mmol/L (ref 98–111)
Creatinine, Ser: 0.69 mg/dL (ref 0.44–1.00)
GFR, Estimated: >60 mL/min (ref >60)
Glucose, Bld: 89 mg/dL (ref 70–99)
Potassium: 3.7 mmol/L (ref 3.5–5.1)
Sodium: 139 mmol/L (ref 135–145)

## 2020-05-14 LAB — TROPONIN I (HIGH SENSITIVITY)
Troponin I (High Sensitivity): 14 ng/L (ref <18)
Troponin I (High Sensitivity): 17 ng/L (ref <18)

## 2020-05-14 LAB — BRAIN NATRIURETIC PEPTIDE: B Natriuretic Peptide: 32.7 pg/mL (ref 0.0–100.0)

## 2020-05-14 MED ORDER — IOHEXOL 350 MG/ML SOLN
100.0000 mL | Freq: Once | INTRAVENOUS | Status: AC | PRN
  Administered 2020-05-14: 100 mL via INTRAVENOUS

## 2020-05-14 MED ORDER — CYCLOBENZAPRINE HCL 10 MG PO TABS
10.0000 mg | ORAL_TABLET | Freq: Two times a day (BID) | ORAL | 0 refills | Status: DC | PRN
Start: 2020-05-14 — End: 2020-12-08

## 2020-05-14 MED ORDER — LIDOCAINE 5 % EX PTCH: 1.0000 | MEDICATED_PATCH | CUTANEOUS | 0 refills | Status: DC

## 2020-05-14 NOTE — ED Triage Notes (Signed)
Patient reports Covid+ x2 days ago. Reports worsening SOB and right side chest pain today.

## 2020-05-14 NOTE — ED Provider Notes (Signed)
Patient signed to me by Dr. Sherry Ruffing pending chest CT results. That was negative for PE. He suspected muscle strain and I agree with this. Will discharge home   Lacretia Leigh, MD 05/14/20 1610

## 2020-05-14 NOTE — ED Provider Notes (Signed)
[facility] DEPT Provider Note   CSN: [id number] Arrival date & time: 05/14/20  1305     History Chief Complaint  Patient presents with  . Covid Positive    Ashley Villegas is a 70 y.o. female.  The history is provided by the patient and medical records. No language interpreter was used.  Chest Pain Pain location:  R chest Pain quality: pressure and sharp   Pain radiates to:  R shoulder Pain severity:  Severe Onset quality:  Gradual Timing:  Constant Progression:  Waxing and waning Chronicity:  New Context: breathing   Relieved by:  Nothing Worsened by:  Coughing Ineffective treatments:  None tried Associated symptoms: cough, diaphoresis, lower extremity edema and shortness of breath   Associated symptoms: no abdominal pain, no altered mental status, no back pain, no fatigue, no fever, no headache, no nausea, no near-syncope, no palpitations and no vomiting   Risk factors: not female and no prior DVT/PE        Past Medical History:  Diagnosis Date  . 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 Anxiety   . Arthritis   . Carpal tunnel syndrome, bilateral upper limbs   . Complication of anesthesia   . Depression   . Family history of uterine cancer   . GERD (gastroesophageal reflux disease)   . History of radiation therapy 11/08/2016-12/27/2016   pelvis 45 Gy in 25 fractions, pelvis boost 14 gy in 7 fractions  . Hypothyroidism    on synthroid   . PONV (postoperative nausea and vomiting)     Patient Active Problem List   Diagnosis Date Noted  . Preventive measure 11/17/2019  . Cancer associated pain 11/03/2019  . Class II obesity 05/26/2019  . Primary osteoarthritis of left knee 04/15/2019  . Inflammatory arthritis 03/14/2018  . Neuropathy 01/16/2018  . Vitamin D deficiency 11/11/2017  . Hypothyroid 11/09/2017  . Total body pain 11/09/2017  . Sinusitis 10/11/2017  . Mediastinal  lymphadenopathy 10/11/2017  . Skin rash 10/11/2017  . Genetic testing 08/29/2017  . Hypothyroidism 08/08/2017  . 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 Depression 07/30/2017  . Encounter for antineoplastic immunotherapy 07/30/2017  . Metastasis to lung ([place]) 07/30/2017  . Groin pain, right 07/30/2017  . Family history of uterine cancer   . Left groin pain   . Endometrial cancer [facility])     Past Surgical History:  Procedure Laterality Date  . CARPAL TUNNEL RELEASE Bilateral 2020  . CESAREAN SECTION    . COLONOSCOPY    . DIAGNOSTIC LAPAROSCOPY     fibroid tumors on her ovaries   . DILATION AND CURETTAGE OF UTERUS    . HAND SURGERY Right 2019   carpal tunnel  . HYSTEROSCOPY WITH D & C N/A 09/15/2014   Procedure: DILATATION AND CURETTAGE /HYSTEROSCOPY/MYOSURE RESECTION OF POLYP;  Surgeon: Janyth Pupa, DO;  Location: [facility];  Service: Gynecology;  Laterality: N/A;  . KNEE SURGERY    . LYMPH NODE DISSECTION N/A 10/26/2014   Procedure: POSS LYMPHADENECTOMY ;  Surgeon: [name], MD;  Location: [facility];  Service: Gynecology;  Laterality: N/A;  . ROBOTIC ASSISTED TOTAL HYSTERECTOMY WITH BILATERAL SALPINGO OOPHERECTOMY Bilateral 10/26/2014   Procedure: ROBOTIC ASSISTED TOTAL HYSTERECTOMY WITH BILATERAL SALPINGO OOPHORECTOMY;  Surgeon: [name], MD;  Location: [facility];  Service: Gynecology;  Laterality: Bilateral;  . TOTAL KNEE ARTHROPLASTY Left 04/15/2019   Procedure: LEFT TOTAL KNEE ARTHROPLASTY;  Surgeon: Melrose Nakayama, MD;  Location: [facility];  Service: Orthopedics;  Laterality: Left;  . TUBAL LIGATION    . WISDOM TOOTH EXTRACTION       OB History   No obstetric history on file.     Family History  Problem Relation Age of Onset  . Breast cancer Maternal Aunt        dx late 50's/60's, died in 9's  . Uterine cancer Mother 18  . Heart failure Mother   . Dementia Father   . Heart attack Paternal Grandfather 6  . Other Sister 62       had abnromal finding on  mammogram, unsure if cancer. Was told to 'remove part of of body'    Social History   Tobacco Use  . Smoking status: Former Smoker    Packs/day: 0.50    Years: 13.00    Pack years: 6.50    Types: Cigarettes    Quit date: 10/20/2007    Years since quitting: 12.5  . Smokeless tobacco: Never Used  Vaping Use  . Vaping Use: Never used  Substance Use Topics  . Alcohol use: Yes    Alcohol/week: 1.0 standard drink    Types: 1 Cans of beer per week    Comment: every 2 to 3 months  . Drug use: No    Home Medications Prior to Admission medications   Medication Sig Start Date End Date Taking? Authorizing Provider  Cholecalciferol (VITAMIN D3) 25 MCG (1000 UT) CAPS Take 1 capsule by mouth daily.    [provider]  FLUoxetine (PROZAC) 40 MG capsule Take 40 mg by mouth daily.    [provider]  SYNTHROID 100 MCG tablet Take 100 mcg by mouth daily before breakfast.  09/19/18   [provider]    Allergies    Oxycodone and Hydrocodone  Review of Systems   Review of Systems  Constitutional: Positive for diaphoresis. Negative for chills, fatigue and fever.  HENT: Negative for congestion.   Eyes: Negative for visual disturbance.  Respiratory: Positive for cough and shortness of breath. Negative for chest tightness and wheezing.   Cardiovascular: Positive for chest pain. Negative for palpitations and near-syncope.  Gastrointestinal: Negative for abdominal pain, constipation, diarrhea, nausea and vomiting.  Genitourinary: Negative for dysuria and flank pain.  Musculoskeletal: Negative for back pain and neck pain.  Neurological: Negative for light-headedness and headaches.  Psychiatric/Behavioral: Negative for agitation and confusion.  All other systems reviewed and are negative.   Physical Exam Updated Vital Signs BP (!) 161/88   Pulse 76   Temp 98.1 F (36.7 C) (Oral)   Resp 20   SpO2 98%   Physical Exam Vitals and nursing note reviewed.   Constitutional:      General: She is not in acute distress.    Appearance: She is well-developed and well-nourished. She is not ill-appearing, toxic-appearing or diaphoretic.  HENT:     Head: Normocephalic and atraumatic.     Nose: No congestion or rhinorrhea.     Mouth/Throat:     Mouth: Mucous membranes are moist.     Pharynx: No oropharyngeal exudate or posterior oropharyngeal erythema.  Eyes:     Extraocular Movements: Extraocular movements intact.     Conjunctiva/sclera: Conjunctivae normal.     Pupils: Pupils are equal, round, and reactive to light.  Cardiovascular:     Rate and Rhythm: Normal rate and regular rhythm.     Heart sounds: No murmur heard.   Pulmonary:     Effort: Pulmonary effort is  normal. No respiratory distress.     Breath sounds: Normal breath sounds. No wheezing, rhonchi or rales.  Chest:     Chest wall: Tenderness present.    Abdominal:     General: Abdomen is flat.     Palpations: Abdomen is soft.     Tenderness: There is no abdominal tenderness. There is no right CVA tenderness, left CVA tenderness, guarding or rebound.  Musculoskeletal:        General: No edema.     Cervical back: Neck supple. No tenderness.  Skin:    General: Skin is warm and dry.     Capillary Refill: Capillary refill takes less than 2 seconds.  Neurological:     General: No focal deficit present.     Mental Status: She is alert and oriented to person, place, and time.  Psychiatric:        Mood and Affect: Mood and affect and mood normal.     ED Results / Procedures / Treatments   Labs (all labs ordered are listed, but only abnormal results are displayed) Labs Reviewed  CBC - Abnormal; Notable for the following components:      Result Value   WBC 3.4 (*)    All other components within normal limits  BASIC METABOLIC PANEL  HEPATIC FUNCTION PANEL  LIPASE, BLOOD  BRAIN NATRIURETIC PEPTIDE  TROPONIN I (HIGH SENSITIVITY)  TROPONIN I (HIGH SENSITIVITY)     EKG None ED ECG REPORT   Date: 05/14/2020  Rate: 73  Rhythm: normal sinus rhythm  QRS Axis: normal  Intervals: normal  ST/T Wave abnormalities: normal  Conduction Disutrbances:none  Narrative Interpretation: low voltage  Old EKG Reviewed: unchanged  I have personally reviewed the EKG tracing and agree with the computerized printout as noted.   Radiology DG Chest Port 1 View  Result Date: 05/14/2020 CLINICAL DATA:  Shortness of breath and RIGHT chest pain. Positive COVID. EXAM: PORTABLE CHEST 1 VIEW COMPARISON:  04/14/2019 FINDINGS: Mild cardiomegaly noted. There is no evidence of focal airspace disease, pulmonary edema, suspicious pulmonary nodule/mass, pleural effusion, or pneumothorax. No acute bony abnormalities are identified. IMPRESSION: Mild cardiomegaly without evidence of acute cardiopulmonary disease. Electronically Signed   By: Margarette Canada M.D.   On: 05/14/2020 14:21    Procedures Procedures    Medications Ordered in ED Medications  iohexol (OMNIPAQUE) 350 MG/ML injection 100 mL (100 mLs Intravenous Contrast Given 05/14/20 1531)    ED Course  I have reviewed the triage vital signs and the nursing notes.  Pertinent labs & imaging results that were available during my care of the patient were reviewed by me and considered in my medical decision making (see chart for details).    MDM Rules/Calculators/A&P                          Ashley Villegas is a 70 y.o. female with a past medical history significant for prior uterine cancer with lung metastasis history, hypothyroidism, arthritis, GERD, depression, anxiety, and recent diagnosis of COVID-19 several days ago who presents with new right-sided chest pain shortness of breath, nausea, diarrhea, and fatigue.  Patient reports that she was diagnosed with COVID several days ago and started having symptoms 6 days ago and was found to be positive on the Covid test 3 days ago.  She says that she was doing well  yesterday but then today started having worsened right-sided sharp and pressure-like chest pain in her right chest.  It  radiates towards her right shoulder and has a sharp component when she takes a deep breath.  She is never felt this before.  She reports nausea vomiting and reports of diarrhea.  She reports he tried to stay hydrated.  She denies any new fevers or chills.  She denies any constipation but does report the diarrhea.  No urinary complaints.  No syncope or falls.  No trauma to the chest.  She does report a strong family history of pulmonary emboli in the past and she reports no chronic leg edema that has been worsening bilaterally.  On exam, lungs are clear and right chest is very tender to palpation.  I can reproduce some of her discomfort.  No murmur.  No rhonchi.  Abdomen nontender.  Good pulses in extremities.  Legs have some mild edema bilaterally which she reports has been chronic but worsening.  No focal neurologic deficits.  EKG shows no STEMI.  After initial conversation with patient, we will get CT PE study to rule out pulmonary embolism as well as screening labs and troponins.  We discussed that if work-up is reassuring, suspect this may be a musculoskeletal type pain in relation to her coughing fits causing discomfort.  Patient agrees with this plan.  She is not hypoxic or tachycardic on initial evaluation.  Anticipate reassessment after work-up.  Care transferred to oncoming team while waiting for results to return.  Anticipate discharge if work-up is reassuring.   Final Clinical Impression(s) / ED Diagnoses Final diagnoses:  Right-sided chest pain     Clinical Impression: 1. Right-sided chest pain   2. Chest pain     Disposition: Care transferred to oncoming team awaiting for results of work-up.  This note was prepared with assistance of Systems analyst. Occasional wrong-word or sound-a-like substitutions may have occurred due to the inherent  limitations of voice recognition software.     Tegeler, Gwenyth Allegra, MD 05/14/20 [phone]

## 2020-05-16 ENCOUNTER — Telehealth: Payer: Self-pay

## 2020-05-16 NOTE — Telephone Encounter (Signed)
TC to Pt. To follow up about Triage call on 05/14/20. No answer, Left Pt a message to return call

## 2020-05-31 DIAGNOSIS — E039 Hypothyroidism, unspecified: Secondary | ICD-10-CM | POA: Diagnosis not present

## 2020-06-02 ENCOUNTER — Encounter: Payer: Self-pay | Admitting: Radiation Oncology

## 2020-06-02 ENCOUNTER — Ambulatory Visit
Admission: RE | Admit: 2020-06-02 | Discharge: 2020-06-02 | Disposition: A | Discharge status: Home / Self Care | Payer: Medicare Other | Source: Ambulatory Visit | Attending: Radiation Oncology | Admitting: Radiation Oncology

## 2020-06-02 ENCOUNTER — Other Ambulatory Visit: Payer: Self-pay

## 2020-06-02 VITALS — BP 146/77 | HR 70 | Temp 96.7°F | Resp 18 | Ht 60.0 in | Wt 177.1 lb

## 2020-06-02 DIAGNOSIS — C541 Malignant neoplasm of endometrium: Secondary | ICD-10-CM

## 2020-06-02 DIAGNOSIS — I517 Cardiomegaly: Secondary | ICD-10-CM | POA: Insufficient documentation

## 2020-06-02 DIAGNOSIS — U071 COVID-19: Secondary | ICD-10-CM | POA: Insufficient documentation

## 2020-06-02 DIAGNOSIS — Z8542 Personal history of malignant neoplasm of other parts of uterus: Secondary | ICD-10-CM | POA: Diagnosis not present

## 2020-06-02 DIAGNOSIS — Z79899 Other long term (current) drug therapy: Secondary | ICD-10-CM | POA: Insufficient documentation

## 2020-06-02 DIAGNOSIS — Z923 Personal history of irradiation: Secondary | ICD-10-CM | POA: Insufficient documentation

## 2020-06-02 NOTE — Progress Notes (Signed)
Patient is here today for follow up post radiation to endometrium.  Radiation completed September 2021.  Patient denies having any pain today. Denies diarrhea/constipation.  Denies nausea/vomiting.  Reports urinary urgency since treatment.  Denies burning with urination.  Denies urinary incontinence.  Denies vaginal discharge.  Denies vaginal bleeding.  Reports a great deal of fatigue.  Appetite is normal. In past pelvic exams have been tolerable.  Not sexually active and denies using the vaginal dilator. Denies any skin issues post radiation.    Vitals:   06/02/20 1138  BP: (!) 146/77  Pulse: 70  Resp: 18  Temp: (!) 96.7 F (35.9 C)  TempSrc: Temporal  SpO2: 99%  Weight: 177 lb 2 oz (80.3 kg)  Height: 5' (1.524 m)

## 2020-06-02 NOTE — Progress Notes (Signed)
Radiation Oncology         [phone] ________________________________  Name: Ashley Villegas MRN: [id number]  Date: 06/02/2020  DOB: Mar 30, 1950  Follow-Up Visit Note  CC: Ashley Orn, MD  Ashley Orn, MD    ICD-10-CM   1. 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 Endometrial cancer ([facility])  C54.1     Diagnosis: Recurrent endometrial cancer, original stage IA grade 1, s/p hysterectomy with enhancinglesion centered along the right levator ani muscle, status post additional radiation therapy  Interval Since Last Radiation: Six months and one week  Radiation Treatment Dates: 11/10/2019 through 11/27/2019 Site Technique Total Dose (Gy) Dose per Fx (Gy) Completed Fx Beam Energies  Perineum: Pelvis IMRT 28/28 2 14/14 6X    Narrative:  The patient returns today for routine follow-up. Since her last visit, she underwent a pelvic MRI on 01/21/2020 that showed complete resolution of the heterogeneously enhancing lesion seen along the right vaginal introitus on the prior study. There was no residual measurable lesion at that location. There were no new or progressive findings in the pelvis/pelvic floor.  She was last seen by Ashley Villegas on 01/22/2020, at which time they discussed how most patients would undergo systemic chemotherapy as precautionary measures given the high risk of systemic relapse. However, in the past, she tolerated immunotherapy very poorly. The risk of treatment was felt to outweigh the benefit, so it was recommended that she proceed with closer follow-up between GYN oncology and radiation oncology. If she is found to have systemic disease, then palliative treatment will be prescribed.  She was last seen by Ashley Villegas on 03/02/2020, at which time there was no evidence of disease on examination.   Of note, the patient was seen in the ED on 05/14/2020 for right-sided chest pain. CTA of chest at that time did now show any focal infiltrates in the lungs, nor did  it show a PE. She did, however, test positive for COVID-19 a few days prior.  On review of systems, she reports overall feeling well with good appetite. She denies pain within the pelvis or pubic region.  She denies any vaginal bleeding or discharge.  She denies any hematuria or rectal bleeding.  The patient denies any abdominal bloating.  ALLERGIES:  is allergic to oxycodone and hydrocodone.  Meds: Current Outpatient Medications  Medication Sig Dispense Refill  . Cholecalciferol (VITAMIN D3) 25 MCG (1000 UT) CAPS Take 1,000 Units by mouth daily.    . cyclobenzaprine (FLEXERIL) 10 MG tablet Take 1 tablet (10 mg total) by mouth 2 (two) times daily as needed for muscle spasms. 20 tablet 0  . FLUoxetine (PROZAC) 40 MG capsule Take 40 mg by mouth daily.    . furosemide (LASIX) 40 MG tablet Take 40 mg by mouth 2 (two) times daily.    Marland Kitchen levothyroxine (SYNTHROID) 100 MCG tablet Take 100 mcg by mouth daily before breakfast.    . lidocaine (LIDODERM) 5 % Place 1 patch onto the skin daily. Remove & Discard patch within 12 hours or as directed by MD 15 patch 0   No current facility-administered medications for this encounter.    Physical Findings: The patient is in no acute distress. Patient is alert and oriented.  height is 5' (1.524 m) and weight is 177 lb 2 oz (80.3 kg). Her temporal temperature is 96.7 F (35.9 C) (abnormal). Her blood pressure is 146/77 (abnormal) and her pulse is 70. Her respiration is 18 and oxygen saturation is 99%.  Lungs are clear to auscultation bilaterally. Heart has regular rate and rhythm. No palpable cervical, supraclavicular, or axillary adenopathy. Abdomen soft, non-tender, normal bowel sounds.  On pelvic examination the external genitalia were unremarkable. A speculum exam was performed. There are no mucosal lesions noted in the vaginal vault. On bimanual and rectovaginal examination there were no pelvic masses appreciated.  Careful palpation along the anterior  distal right pelvic wall reveals no point tenderness or palpable abnormality.  Lab Findings: Lab Results  Component Value Date   WBC 3.4 (L) 05/14/2020   HGB 14.0 05/14/2020   HCT 42.7 05/14/2020   MCV 93.6 05/14/2020   PLT 231 05/14/2020    Radiographic Findings: CT Angio Chest PE W and/or Wo Contrast  Result Date: 05/14/2020 CLINICAL DATA:  PE suspected. Cancer history. New COVID-19. Severe pleuritic chest pain. Shortness of breath. EXAM: CT ANGIOGRAPHY CHEST WITH CONTRAST TECHNIQUE: Multidetector CT imaging of the chest was performed using the standard protocol during bolus administration of intravenous contrast. Multiplanar CT image reconstructions and MIPs were obtained to evaluate the vascular anatomy. CONTRAST:  139m OMNIPAQUE IOHEXOL 350 MG/ML SOLN COMPARISON:  November 02, 2019 FINDINGS: Cardiovascular: Minimal atherosclerotic change in the aortic arch. The thoracic aorta is otherwise normal with no aneurysm or dissection identified. Mild cardiomegaly. Evaluation for peripheral emboli is limited due to mild respiratory motion. No pulmonary emboli identified. Mediastinum/Nodes: No enlarged mediastinal, hilar, or axillary lymph nodes. Thyroid gland, trachea, and esophagus demonstrate no significant findings. Lungs/Pleura: Lungs are clear. No pleural effusion or pneumothorax. Upper Abdomen: No acute abnormality. Musculoskeletal: No chest wall abnormality. No acute or significant osseous findings. Review of the MIP images confirms the above findings. IMPRESSION: 1. No pulmonary emboli identified. No acute abnormalities are noted. 2. No focal infiltrates in the lungs. Aortic Atherosclerosis (ICD10-I70.0). Electronically Signed   By: DDorise BullionIII M.D   On: 05/14/2020 15:48   DG Chest Port 1 View  Result Date: 05/14/2020 CLINICAL DATA:  Shortness of breath and RIGHT chest pain. Positive COVID. EXAM: PORTABLE CHEST 1 VIEW COMPARISON:  04/14/2019 FINDINGS: Mild cardiomegaly noted. There is no  evidence of focal airspace disease, pulmonary edema, suspicious pulmonary nodule/mass, pleural effusion, or pneumothorax. No acute bony abnormalities are identified. IMPRESSION: Mild cardiomegaly without evidence of acute cardiopulmonary disease. Electronically Signed   By: JMargarette CanadaM.D.   On: 05/14/2020 14:21    Impression: Recurrent endometrial cancer, original stage IA grade 1, s/p hysterectomy with enhancinglesion centered along the right levator ani muscle, status post palliative radiation therapy.  No evidence of recurrence on exam today. Recent pelvic MRI showed complete resolution of the heterogeneously enhancing lesion seen along the right vaginal introitus on the prior study. There was no residual measurable lesion at that location. There were no new or progressive findings in the pelvis/pelvic floor.  Plan: The patient will follow up with Dr. TBerline Lopesin three months and with radiation oncology in six months.  Total time spent in this encounter was 20 minutes which included reviewing the patient's most recent follow-ups, MRI of pelvis, ED visit, CTA of chest, physical examination, and documentation. ____________________________________   JBlair Promise PhD, MD  This document serves as a record of services personally performed by JGery Pray MD. It was created on his behalf by MClerance Lav a trained medical scribe. The creation of this record is based on the scribe's personal observations and the provider's statements to them. This document has been checked and approved by the attending provider.

## 2020-07-05 DIAGNOSIS — R002 Palpitations: Secondary | ICD-10-CM | POA: Diagnosis not present

## 2020-07-05 DIAGNOSIS — R609 Edema, unspecified: Secondary | ICD-10-CM | POA: Diagnosis not present

## 2020-08-26 ENCOUNTER — Encounter: Payer: Self-pay | Admitting: Gynecologic Oncology

## 2020-08-29 NOTE — Progress Notes (Addendum)
Gynecologic Oncology Return Clinic Visit  08/30/20  Reason for Visit: surveillance in the setting of recurrent uterine cancer  Treatment History: Oncology History Overview Note  Abnormal MSI, germline mutation was negative Endometrioid   Endometrial cancer ([place])  09/15/2014 Pathology Results   Endometrium, curettage - ENDOMETRIAL ADENOCARCINOMA. - SEE COMMENT. Microscopic Comment Although definitive characterization is best performed on resection specimen, as sampled, the endometrial adenocarcinoma appears to be endometrioid subtype, FIGO grade 1.    09/15/2014 Surgery   PreOp: postmenopausal bleeding, cervical stenosis PostOp: same and uterine polyp Procedure:  Hysteroscopy, Dilation and Curettage, Myosure polypectomy Surgeon: Dr. Janyth Pupa  Findings:8cm uterus with thickened polypoid-like endometrium, large 2cm irregular appearing polyp  Specimens: 1) endometrial curettings   10/26/2014 Pathology Results   1. Uterus +/- tubes/ovaries, neoplastic - INVASIVE ADENOCARCINOMA, ENDOMETRIOID TYPE, SEE COMMENT. - TUMOR INVOLVES LESS THAN ONE HALF MYOMETRIAL THICKNESS. - TUMOR INVOLVES UTERINE ADENOMYOSIS. - BENIGN LEIOMYOMATA (UP TO 2.5 CM). - BENIGN CERVICAL MUCOSA; NEGATIVE FOR INTRAEPITHELIAL LESION OR MALIGNANCY. - BENIGN RIGHT AND LEFT OVARIES; NEGATIVE FOR ATYPIA OR MALIGNANCY. - BENIGN RIGHT AND LEFT FALLOPIAN TUBE; NEGATIVE FOR ATYPIA OR MALIGNANCY. - BENIGN PARATUBAL CYSTS; NEGATIVE FOR ATYPIA OR MALIGNANCY. - SEE TUMOR SYNOPTIC TEMPLATE BELOW. 2. Lymph nodes, regional resection, right pelvic - FOUR LYMPH NODES, NEGATIVE FOR TUMOR (0/4). 3. Lymph nodes, regional resection, left pelvic - FOUR LYMPH NODES, NEGATIVE FOR TUMOR (0/4). Microscopic Comment 1. ONCOLOGY TABLE-UTERUS, CARCINOMA OR CARCINOSARCOMA Specimen: Uterus and bilateral fallopian tubes and ovaries. Procedure: Hysterectomy and bilateral salpingo-oophorectomy. Lymph node sampling performed: Yes. Specimen  integrity: Intact. Maximum tumor size: 4.0 cm (tumor involved entire endometrium) Histologic type: Adenocarcinoma, endometrioid type. Grade: 1 Myometrial invasion: 0.5 cm where myometrium is 1.5 cm in thickness Cervical stromal involvement: Absent. Extent of involvement of other organs: None Lymph - vascular invasion: Absent. Peritoneal washings: N/A Lymph nodes: # examined - 8 ; # positive - 0 Pelvic lymph nodes: N/A involved of N/A lymph nodes. Para-aortic lymph nodes: N/A involved of N/A lymph nodes. Other (specify involvement and site): N/A TNM code: pT1a, pN0 FIGO Stage (based on pathologic findings, needs clinical correlation): N/A Comments: None MSI testing abnormal   10/26/2014 Surgery   Surgery: Total robotic hysterectomy, bilateral salpingo-oophorectomy, bilateral pelvic lymph node dissection.   Surgeons:  Lucita Lora. Alycia Rossetti, MD; Lahoma Crocker, MD   Pathology: Uterus, cervix, bilateral tubes and ovaries, bilateral pelvic lymph nodes to pathology.   Operative findings: Omentum adherent to midline vertical incision. Fibroid uterus. Normal adnexa. Adhesions of anterior bladder to uterus. Frozen section with grade 1 cancer focally involving adenomyosis, minimal myometrial invasion.   11/17/2014 Imaging   Ct abdomen 1. Status post hysterectomy. Pelvic edema which is greater than typically seen 3 weeks postop. Suspicious for postoperative infection. 2. Bladder wall thickening and irregularity. Although this could be partially due to underdistention, Concurrent cystitis cannot be excluded. 3. Bilateral fluid density lesions along the pelvic sidewalls. Favored to represent seromas or lymphangiomas. No specific features to suggest abscess. If the patient's symptoms persist after appropriate antibiotic therapy, aspiration of the largest left pelvic sidewall "Lesion" should be considered. 4. Suspicion of mild hepatic steatosis. Indeterminate right hepatic lobe lesion. If definitive  characterization is desired in this patient with history of primary malignancy, nonemergent pre and post contrast abdominal MRI could be performed. 5. Hiatal hernia.   11/25/2014 Imaging   1. Persistent bilateral pelvic sidewall fluid collections, left greater than right, with left-sided inflammatory changes. 2. Technically successful 12 French left pelvic abscess  drain catheter placement. A sample of the aspirate was sent for Gram stain, culture and sensitivity.   12/08/2014 Imaging   CT abdomen and pelvis 1. Complete resolution of dominant fluid collection within the left hemipelvis following percutaneous drainage catheter placement. This percutaneous drainage catheter was subsequent removed intact at the patient's bedside. 2. Continued decrease in size of bilobed fluid collections within the right hemipelvis with superficial component measuring 1.6 cm and posterior component measuring 2.6 cm, indeterminate though both favored to represent evolving seromas. 3. Unchanged indeterminate approximately 1.2 cm hepatic lesion. Further evaluation with contrast-enhanced abdominal MRI could be performed as clinically indicated. 4. Colonic diverticulosis without evidence of diverticulitis.   10/19/2016 Imaging   New 2.6 cm soft tissue mass involving right vaginal cuff, consistent with locally recurrent carcinoma.  No other sites of metastatic disease identified.  Colonic diverticulosis, without radiographic evidence of diverticulitis.  Mild hepatic steatosis.    10/24/2016 Relapse/Recurrence   Vaginal cuff recurrence. No evidence of distant disease.   10/24/2016 Pathology Results   Vagina, biopsy, right apex - ADENOCARCINOMA - SEE COMMENT Microscopic Comment The morphologic features are consistent with the patient's previously diagnosed endometrioid adenocarcinoma   11/08/2016 - 12/27/2016 Radiation Therapy   45 Gy in 25 fractions of 1.8 Gy. The residual pelvic mass was boosted to 14 Gy in 7  fractions of 2 Gy. No intracavitary lesion seen at completion of pelvic fields to boost with brachytherapy.   06/12/2017 Imaging   CT abdomen and pelvis 1. New 1.1 by 0.8 cm right lower lobe pulmonary nodule, concerning for pulmonary metastatic disease. Possibilities for further assessment include biopsy or nuclear medicine PET-CT to assess this lesion and the rest of the neck/chest/abdomen/pelvis for other potential hypermetabolic lesions. 2. The previous soft tissue mass along the right vaginal cuff is no longer present. 3. Stable and likely benign right hepatic lobe hypodense lesion. 4. Other imaging findings of potential clinical significance: Sigmoid colon diverticulosis. Aortic Atherosclerosis (ICD10-I70.0). Multilevel lumbar degenerative disc disease. Mild cardiomegaly. Small type 1 hiatal hernia.   07/05/2017 Imaging   Ct chest Several bilateral pulmonary nodules measuring up to 11 mm, highly suspicious for pulmonary metastases.   No evidence of lymphadenopathy or pleural effusion.  Aortic Atherosclerosis (ICD10-I70.0).   07/25/2017 Imaging   Status post CT-guided lung nodule biopsy. Tissue specimen sent to pathology for complete histopathologic analysis.   07/25/2017 Pathology Results   Lung, needle/core biopsy(ies), RLL - METASTATIC ADENOCARCINOMA - SEE COMMENT Microscopic Comment By immunohistochemistry, the neoplastic cells are positive for Pax-8 and ER but negative for TTF-1. Overall, this immunoprofile is consistent with metastasis from the patient's known endometrioid adenocarcinoma   07/30/2017 Cancer Staging   Staging form: Corpus Uteri - Carcinoma and Carcinosarcoma, AJCC 8th Edition - Clinical: Stage IVB (rcT1, cN0, pM1) - Signed by Heath Lark, MD on 07/30/2017   08/09/2017 - 11/21/2017 Chemotherapy   The patient had pembrolizumab for chemotherapy treatment.    10/10/2017 Imaging   1. Interval decrease in size and contour. The of previously described right and left lower  lobe pulmonary nodules. 2. Interval increase in size of mediastinal and hilar adenopathy, potentially metastatic in etiology. 3. Aortic Atherosclerosis (ICD10-I70.0).   11/08/2017 - 11/10/2017 Hospital Admission   She was admitted to the hospital due to uncontrolled pain   11/29/2017 Imaging   Chest Impression:  No thoracic metastasis. No discrete pulmonary nodules identified.  Abdomen / Pelvis Impression:  No evidence of local recurrence or metastatic endometrial carcinoma in the abdomen pelvis.  02/21/2018 Imaging   1. No findings to suggest metastatic disease in the chest, abdomen or pelvis. 2. Mild colonic diverticulosis without evidence of acute diverticulitis at this time. 3. Aortic atherosclerosis. 4. Additional incidental findings, as above.   03/10/2018 Imaging   Bone scan: No scintigraphic evidence of osseous metastatic disease.   05/22/2018 Imaging   1. Stable exam. No evidence of recurrent or metastatic carcinoma within the chest, abdomen, or pelvis. 2. Colonic diverticulosis, without radiographic evidence of diverticulitis.   11/13/2018 Imaging   1. No evidence of recurrent or metastatic carcinoma within the chest, abdomen, or pelvis. 2. Colonic diverticulosis, without radiographic evidence of diverticulitis. 3. Stable hepatic steatosis.   Aortic Atherosclerosis (ICD10-I70.0).   05/22/2019 Imaging   1. No evidence of recurrent or metastatic disease in the chest, abdomen, or pelvis status post hysterectomy. 2. Previously seen metastatic pulmonary nodules in the bilateral lower lobes remain resolved. 3. Unchanged irregular opacities of the peripheral right upper lobe, likely chronic sequelae of prior infection or inflammation. 4. Hepatic steatosis. 5. Sigmoid diverticulosis. 6. Aortic Atherosclerosis (ICD10-I70.0).     10/18/2019 Imaging   1. Heterogeneously enhancing lesion measuring 2.4 x 1.9 x 1.9 cm immediately to the right of the urethra along the anterolateral  aspect of the vaginal wall, likely centered in the right levator ani 9 musculature. This has a malignant appearance, but whether this represents a new primary lesion or a metastatic lesion is uncertain on today's examination. 2. Mild colonic diverticulosis.     11/02/2019 Imaging   CT chest 1. No evidence of metastatic disease. 2. Hepatic steatosis. 3.  Aortic atherosclerosis (ICD10-I70.0).   11/10/2019 - 11/27/2019 Radiation Therapy   Radiation Treatment Dates: 11/10/2019 through 11/27/2019 Site Technique Total Dose (Gy) Dose per Fx (Gy) Completed Fx Beam Energies  Perineum: Pelvis IMRT 28/28 2 14/14 6X      01/21/2020 Imaging   1. Complete resolution of the heterogeneously enhancing lesion seen along the right vaginal introitus on the prior study. No residual measurable lesion at this location today. No new or progressive findings in the pelvis/pelvic floor on today's exam. 2. Left colonic diverticulosis without features of diverticulitis.   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    Metastasis to lung ([place])  07/30/2017 Initial Diagnosis   Metastasis to lung ([place])   08/09/2017 - 11/01/2017 Chemotherapy   The patient had pembrolizumab (KEYTRUDA) 200 mg in sodium chloride 0.9 % 50 mL chemo infusion, 200 mg, Intravenous, Once, 5 of 7 cycles Administration: 200 mg (08/09/2017), 200 mg (08/30/2017), 200 mg (09/20/2017), 200 mg (10/11/2017), 200 mg (11/01/2017)  for chemotherapy treatment.      Interval History: The patient presents today for surveillance.  She notes overall doing well.  She denies any vaginal bleeding or  discharge.  She reports a good appetite without nausea or emesis.  She had initially lost some weight but has gained weight since her last visit.  She had been doing the [employer] program, but had to stop this because of the expense.  She was seen in the emergency department in February with her COVID infection and shortness of breath.  At that time she had  a CT of her chest that showed no pulmonary embolism and no focal pulmonary findings.  She notes that since her COVID infection, she gets short of breath more easily with activity and often hears wheezing.  This has improved some with time.  The patient saw Dr. Sondra Come for follow-up in March.  She was NED at that time.  Past Medical/Surgical History: Past Medical History:  Diagnosis Date  . 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 Anxiety   . Arthritis   . Carpal tunnel syndrome, bilateral upper limbs   . Complication of anesthesia   . Depression   . Family history of uterine cancer   . GERD (gastroesophageal reflux disease)   . History of radiation therapy 11/08/2016-12/27/2016   pelvis 45 Gy in 25 fractions, pelvis boost 14 gy in 7 fractions  . Hypothyroidism    on synthroid   . PONV (postoperative nausea and vomiting)     Past Surgical History:  Procedure Laterality Date  . CARPAL TUNNEL RELEASE Bilateral 2020  . CESAREAN SECTION    . COLONOSCOPY    . DIAGNOSTIC LAPAROSCOPY     fibroid tumors on her ovaries   . DILATION AND CURETTAGE OF UTERUS    . HAND SURGERY Right 2019   carpal tunnel  . HYSTEROSCOPY WITH D & C N/A 09/15/2014   Procedure: DILATATION AND CURETTAGE /HYSTEROSCOPY/MYOSURE RESECTION OF POLYP;  Surgeon: Janyth Pupa, DO;  Location: [facility];  Service: Gynecology;  Laterality: N/A;  . KNEE SURGERY    . LYMPH NODE DISSECTION N/A 10/26/2014   Procedure: POSS LYMPHADENECTOMY ;  Surgeon: [name], MD;  Location: [facility];  Service: Gynecology;  Laterality: N/A;  . ROBOTIC ASSISTED TOTAL  HYSTERECTOMY WITH BILATERAL SALPINGO OOPHERECTOMY Bilateral 10/26/2014   Procedure: ROBOTIC ASSISTED TOTAL HYSTERECTOMY WITH BILATERAL SALPINGO OOPHORECTOMY;  Surgeon: [name], MD;  Location: [facility];  Service: Gynecology;  Laterality: Bilateral;  . TOTAL KNEE ARTHROPLASTY Left 04/15/2019   Procedure: LEFT TOTAL KNEE ARTHROPLASTY;  Surgeon: Melrose Nakayama, MD;  Location: [facility];  Service: Orthopedics;  Laterality: Left;  . TUBAL LIGATION    . WISDOM TOOTH EXTRACTION      Family History  Problem Relation Age of Onset  . Breast cancer Maternal Aunt        dx late 50's/60's, died in 54's  . Uterine cancer Mother 22  . Heart failure Mother   . Dementia Father   . Heart attack Paternal Grandfather 42  . Other Sister 26       had abnromal finding on mammogram, unsure if cancer. Was told to 'remove part of of body'    Social History   Socioeconomic History  . Marital status: Single    Spouse name: Not on file  . Number of children: 2  . Years of education: Not on file  . Highest education level: Not on file  Occupational History  . Occupation: customer service  Tobacco Use  . Smoking status: Former Smoker    Packs/day: 0.50    Years: 13.00    Pack years: 6.50    Types: Cigarettes    Quit date: 10/20/2007    Years since quitting: 12.8  . Smokeless tobacco: Never Used  Vaping Use  . Vaping Use: Never used  Substance and Sexual Activity  . Alcohol use: Yes    Alcohol/week: 1.0 standard drink    Types: 1 Cans of beer per week    Comment: every 2  to 3 months  . Drug use: No  . Sexual activity: Not on file  Other Topics Concern  . Not on file  Social History Narrative  . Not on file   Social Determinants of Health   Financial Resource Strain: Not on file  Food Insecurity: Not on file  Transportation Needs: Not on file  Physical Activity: Not on file  Stress: Not on file  Social Connections: Not on file    Current Medications:  Current Outpatient Medications:  .   Cholecalciferol (VITAMIN D3) 25 MCG (1000 UT) CAPS, Take 1,000 Units by mouth daily., Disp: , Rfl:  .  FLUoxetine (PROZAC) 40 MG capsule, Take 40 mg by mouth daily., Disp: , Rfl:  .  levothyroxine (SYNTHROID) 100 MCG tablet, Take 100 mcg by mouth daily before breakfast., Disp: , Rfl:  .  cyclobenzaprine (FLEXERIL) 10 MG tablet, Take 1 tablet (10 mg total) by mouth 2 (two) times daily as needed for muscle spasms. (Patient not taking: Reported on 08/26/2020), Disp: 20 tablet, Rfl: 0 .  furosemide (LASIX) 40 MG tablet, Take 40 mg by mouth 2 (two) times daily. (Patient not taking: Reported on 08/26/2020), Disp: , Rfl:  .  lidocaine (LIDODERM) 5 %, Place 1 patch onto the skin daily. Remove & Discard patch within 12 hours or as directed by MD (Patient not taking: Reported on 08/26/2020), Disp: 15 patch, Rfl: 0  Review of Systems: Denies appetite changes, fevers, chills, fatigue, unexplained weight changes. Denies hearing loss, neck lumps or masses, mouth sores, ringing in ears or voice changes. Denies cough. Denies chest pain or palpitations. Denies leg swelling. Denies abdominal distention, pain, blood in stools, constipation, diarrhea, nausea, vomiting, or early satiety. Denies pain with intercourse, dysuria, frequency, hematuria or incontinence. Denies hot flashes, pelvic pain, vaginal bleeding or vaginal discharge.   Denies joint pain, back pain or muscle pain/cramps. Denies itching, rash, or wounds. Denies dizziness, headaches, numbness or seizures. Denies swollen lymph nodes or glands, denies easy bruising or bleeding. Denies anxiety, depression, confusion, or decreased concentration.  Physical Exam: BP (!) 141/78 (BP Location: Left Arm, Patient Position: Sitting)   Pulse 76   Temp 97.8 F (36.6 C) (Tympanic)   Resp 18   Ht 5' (1.524 m)   Wt 180 lb 6.4 oz (81.8 kg)   SpO2 98%   BMI 35.23 kg/m  General: Alert, oriented, no acute distress. HEENT: Atraumatic, normocephalic, sclera  anicteric. Chest: Clear to auscultation bilaterally.  Unlabored breathing on room air, no wheezing. Cardiovascular: Regular rate and rhythm, no murmurs. Abdomen: Obese, soft, nontender.  Normoactive bowel sounds.  No masses or hepatosplenomegaly appreciated.  Well-healed incisions. Extremities: Grossly normal range of motion.  Warm, well perfused.  No edema bilaterally. Skin: No rashes or lesions noted. Lymphatics: No cervical, supraclavicular, or inguinal adenopathy. GU: Normal appearing external genitalia without erythema, excoriation, or lesions.  Speculum exam reveals mildly atrophic vaginal mucosa with radiation changes noted, no bleeding or discharge.  No masses or nodularity appreciated on bimanual exam. Rectovaginal exam confirms these findings. No tenderness on exam.  Laboratory & Radiologic Studies: None new  Assessment & Plan: Ashley Villegas is a 70 y.o. woman with with recurrent uterine cancer, most recently treated for para-vaginal lesion in 2021 with RT with complete disease resolution, presenting for surveillance visit.  The patient is overall doing very well and is without evidence of recurrent disease on exam today.  Given complete resolution of her vaginal sidewall lesion on last MRI, I do not think  that routine surveillance imaging of this area is warranted.  We discussed her breathing symptoms, which started after her COVID infection in February.  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  I discussed that this would be a somewhat unusual presentation of metastatic pulmonary disease.  Additionally, when she was seen in the emergency department with the symptoms earlier this year, her CT scan did not show any focal findings.  However, I offered that we can get an x-ray today.  The patient would like to hold off and will call me if any of her symptoms worsen.  She is scheduled to see  her primary care provider in August and we will make sure to bring up her breathing symptoms at that visit.  She and I discussed other weight loss strategies that she could utilize.  I suggested a free application that she could use to calculate her daily nutrient and calorie needs as well as to log her food and beverage intake.  We discussed plan for surveillance including visits every 3 months, alternating between radiation oncology and our clinic.  We also reviewed signs and symptoms that would be concerning for disease recurrence and the patient knows to call the clinic if she develops any of these before her next scheduled visit.  32 minutes of total time was spent for this patient encounter, including preparation, face-to-face counseling with the patient and coordination of care, and documentation of the encounter.  Jeral Pinch, MD  Division of Gynecologic Oncology  Department of Obstetrics and Gynecology  [facility]

## 2020-08-30 ENCOUNTER — Other Ambulatory Visit: Payer: Self-pay

## 2020-08-30 ENCOUNTER — Encounter: Payer: Self-pay | Admitting: Gynecologic Oncology

## 2020-08-30 ENCOUNTER — Inpatient Hospital Stay: Payer: Medicare Other | Attending: Gynecologic Oncology | Admitting: Gynecologic Oncology

## 2020-08-30 VITALS — BP 141/78 | HR 76 | Temp 97.8°F | Resp 18 | Ht 60.0 in | Wt 180.4 lb

## 2020-08-30 DIAGNOSIS — Z8542 Personal history of malignant neoplasm of other parts of uterus: Secondary | ICD-10-CM | POA: Insufficient documentation

## 2020-08-30 DIAGNOSIS — R0602 Shortness of breath: Secondary | ICD-10-CM | POA: Diagnosis not present

## 2020-08-30 DIAGNOSIS — Z90722 Acquired absence of ovaries, bilateral: Secondary | ICD-10-CM | POA: Diagnosis not present

## 2020-08-30 DIAGNOSIS — K219 Gastro-esophageal reflux disease without esophagitis: Secondary | ICD-10-CM | POA: Diagnosis not present

## 2020-08-30 DIAGNOSIS — Z90710 Acquired absence of both cervix and uterus: Secondary | ICD-10-CM | POA: Insufficient documentation

## 2020-08-30 DIAGNOSIS — Z79899 Other long term (current) drug therapy: Secondary | ICD-10-CM | POA: Insufficient documentation

## 2020-08-30 DIAGNOSIS — Z923 Personal history of irradiation: Secondary | ICD-10-CM | POA: Insufficient documentation

## 2020-08-30 DIAGNOSIS — F419 Anxiety disorder, unspecified: Secondary | ICD-10-CM | POA: Diagnosis not present

## 2020-08-30 DIAGNOSIS — R062 Wheezing: Secondary | ICD-10-CM | POA: Diagnosis not present

## 2020-08-30 DIAGNOSIS — Z8616 Personal history of COVID-19: Secondary | ICD-10-CM | POA: Diagnosis not present

## 2020-08-30 DIAGNOSIS — E039 Hypothyroidism, unspecified: Secondary | ICD-10-CM | POA: Insufficient documentation

## 2020-08-30 DIAGNOSIS — C541 Malignant neoplasm of endometrium: Secondary | ICD-10-CM

## 2020-08-30 DIAGNOSIS — F32A Depression, unspecified: Secondary | ICD-10-CM | POA: Insufficient documentation

## 2020-08-30 DIAGNOSIS — Z9221 Personal history of antineoplastic chemotherapy: Secondary | ICD-10-CM | POA: Diagnosis not present

## 2020-08-30 DIAGNOSIS — Z87891 Personal history of nicotine dependence: Secondary | ICD-10-CM | POA: Diagnosis not present

## 2020-08-30 NOTE — Patient Instructions (Signed)
It was great to see you today!  I do not see or feel any evidence of cancer recurrence on your exam.  Please keep me posted about your breathing.  Given the timing of your symptoms starting after your COVID infection, I think that your breathing symptoms are likely related to that.  It would be unusual for cancer in the lungs to cause the symptoms you are having.  But, if you have any change to your symptoms or your breathing is not improving, please call the office and we can at least get a chest x-ray.  Otherwise, please make sure to bring this up with your primary care provider at your visit in August.  In terms of weight loss, the application that we discussed is called "LoseIt!"  You can get it on the [employer].  There is a version that is free for use.  I think this is a great tool to help you calculate what your daily calorie intake should be and to help you track what you are eating and drinking as well as your exercise.  You will follow-up with radiation oncology in 3 months and I will see you back in 6 months.  As always, if you have any new, worsening, or concerning symptoms, please call to see me sooner.

## 2020-08-31 ENCOUNTER — Other Ambulatory Visit: Payer: Self-pay | Admitting: Internal Medicine

## 2020-08-31 DIAGNOSIS — Z1231 Encounter for screening mammogram for malignant neoplasm of breast: Secondary | ICD-10-CM

## 2020-09-02 ENCOUNTER — Ambulatory Visit
Admission: RE | Admit: 2020-09-02 | Discharge: 2020-09-02 | Disposition: A | Discharge status: Home / Self Care | Payer: Medicare Other | Source: Ambulatory Visit | Attending: Internal Medicine | Admitting: Internal Medicine

## 2020-09-02 ENCOUNTER — Other Ambulatory Visit: Payer: Self-pay

## 2020-09-02 DIAGNOSIS — Z1231 Encounter for screening mammogram for malignant neoplasm of breast: Secondary | ICD-10-CM

## 2020-09-12 ENCOUNTER — Other Ambulatory Visit: Payer: Self-pay

## 2020-09-12 ENCOUNTER — Emergency Department (HOSPITAL_COMMUNITY)
Admission: EM | Admit: 2020-09-12 | Discharge: 2020-09-12 | Disposition: A | Discharge status: Home / Self Care | Payer: Medicare Other | Attending: Emergency Medicine | Admitting: Emergency Medicine

## 2020-09-12 ENCOUNTER — Other Ambulatory Visit (HOSPITAL_COMMUNITY): Payer: Self-pay

## 2020-09-12 ENCOUNTER — Emergency Department (HOSPITAL_COMMUNITY): Payer: Medicare Other

## 2020-09-12 ENCOUNTER — Encounter (HOSPITAL_COMMUNITY): Payer: Self-pay

## 2020-09-12 DIAGNOSIS — Z8544 Personal history of malignant neoplasm of other female genital organs: Secondary | ICD-10-CM | POA: Insufficient documentation

## 2020-09-12 DIAGNOSIS — Z20822 Contact with and (suspected) exposure to covid-19: Secondary | ICD-10-CM | POA: Diagnosis not present

## 2020-09-12 DIAGNOSIS — Z79899 Other long term (current) drug therapy: Secondary | ICD-10-CM | POA: Insufficient documentation

## 2020-09-12 DIAGNOSIS — E039 Hypothyroidism, unspecified: Secondary | ICD-10-CM | POA: Insufficient documentation

## 2020-09-12 DIAGNOSIS — Z87891 Personal history of nicotine dependence: Secondary | ICD-10-CM | POA: Diagnosis not present

## 2020-09-12 DIAGNOSIS — R059 Cough, unspecified: Secondary | ICD-10-CM | POA: Diagnosis present

## 2020-09-12 DIAGNOSIS — J209 Acute bronchitis, unspecified: Secondary | ICD-10-CM | POA: Insufficient documentation

## 2020-09-12 DIAGNOSIS — Z96652 Presence of left artificial knee joint: Secondary | ICD-10-CM | POA: Diagnosis not present

## 2020-09-12 LAB — RESP PANEL BY RT-PCR (FLU A&B, COVID) ARPGX2
Influenza A by PCR: NEGATIVE
Influenza B by PCR: NEGATIVE
SARS Coronavirus 2 by RT PCR: NEGATIVE

## 2020-09-12 MED ORDER — TRAMADOL HCL 50 MG PO TABS
50.0000 mg | ORAL_TABLET | Freq: Four times a day (QID) | ORAL | 0 refills | Status: DC | PRN
  Filled 2020-09-12: qty 15, 4d supply, fill #0

## 2020-09-12 MED ORDER — DOXYCYCLINE HYCLATE 100 MG PO CAPS
100.0000 mg | ORAL_CAPSULE | Freq: Two times a day (BID) | ORAL | 0 refills | Status: DC
  Filled 2020-09-12: qty 14, 7d supply, fill #0

## 2020-09-12 MED ORDER — DOXYCYCLINE HYCLATE 100 MG PO TABS
100.0000 mg | ORAL_TABLET | Freq: Once | ORAL | Status: AC
  Administered 2020-09-12: 100 mg via ORAL
  Filled 2020-09-12: qty 1

## 2020-09-12 MED ORDER — IBUPROFEN 600 MG PO TABS
600.0000 mg | ORAL_TABLET | Freq: Four times a day (QID) | ORAL | 0 refills | Status: DC | PRN
  Filled 2020-09-12: qty 30, 8d supply, fill #0

## 2020-09-12 NOTE — ED Provider Notes (Signed)
[facility] DEPT Provider Note   CSN: [id number] Arrival date & time: 09/12/20  6789     History Chief Complaint  Patient presents with   Cough   chest congestion    Ashley Villegas is a 70 y.o. female.  Pt presents to the ED today with cough and congestion.  Sx have been going on for 5 days.  She has not been exposed to anyone with known Covid.  She has been fully vaccinated.  She has not taken anything today for her sx.  She did take a home Covid test on Friday (6/10) which was negative.      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, recurrent    Anxiety    Arthritis    Carpal tunnel syndrome, bilateral upper limbs    Complication of anesthesia    Depression    Family history of uterine cancer    GERD (gastroesophageal reflux disease)    History of radiation therapy 11/08/2016-12/27/2016   pelvis 45 Gy in 25 fractions, pelvis boost 14 gy in 7 fractions   Hypothyroidism    on synthroid    PONV (postoperative nausea and vomiting)     Patient Active Problem List   Diagnosis Date Noted   Preventive measure 11/17/2019   Cancer associated pain 11/03/2019   Class II obesity 05/26/2019   Primary osteoarthritis of left knee 04/15/2019   Inflammatory arthritis 03/14/2018   Neuropathy 01/16/2018   Vitamin D deficiency 11/11/2017   Hypothyroid 11/09/2017   Total body pain 11/09/2017   Sinusitis 10/11/2017   Mediastinal lymphadenopathy 10/11/2017   Skin rash 10/11/2017   Genetic testing 08/29/2017   Hypothyroidism 08/08/2017   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   Depression 07/30/2017   Encounter for antineoplastic immunotherapy 07/30/2017   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/30/2017   Groin pain, right 07/30/2017   Family history of uterine cancer    Left groin pain    Endometrial cancer ([place])     Past Surgical History:  Procedure Laterality Date   CARPAL  TUNNEL RELEASE Bilateral 2020   CESAREAN SECTION     COLONOSCOPY     DIAGNOSTIC LAPAROSCOPY     fibroid tumors on her ovaries    DILATION AND CURETTAGE OF UTERUS     HAND SURGERY Right 2019   carpal tunnel   HYSTEROSCOPY WITH D & C N/A 09/15/2014   Procedure: DILATATION AND CURETTAGE /HYSTEROSCOPY/MYOSURE RESECTION OF POLYP;  Surgeon: Janyth Pupa, DO;  Location: [facility];  Service: Gynecology;  Laterality: N/A;   KNEE SURGERY     LYMPH NODE DISSECTION N/A 10/26/2014   Procedure: POSS LYMPHADENECTOMY ;  Surgeon: [name], MD;  Location: [facility];  Service: Gynecology;  Laterality: N/A;   ROBOTIC ASSISTED TOTAL HYSTERECTOMY WITH BILATERAL SALPINGO OOPHERECTOMY Bilateral 10/26/2014   Procedure: ROBOTIC ASSISTED TOTAL HYSTERECTOMY WITH BILATERAL SALPINGO OOPHORECTOMY;  Surgeon: [name], MD;  Location: [facility];  Service: Gynecology;  Laterality: Bilateral;   TOTAL KNEE ARTHROPLASTY Left 04/15/2019   Procedure: LEFT TOTAL KNEE ARTHROPLASTY;  Surgeon: Melrose Nakayama, MD;  Location: [facility];  Service: Orthopedics;  Laterality: Left;   TUBAL LIGATION     WISDOM TOOTH EXTRACTION       OB History   No obstetric history on file.     Family History  Problem Relation Age of Onset   Breast cancer Maternal Aunt        dx  late 50's/60's, died in 47's   Uterine cancer Mother 37   Heart failure Mother    Dementia Father    Heart attack Paternal Grandfather 33   Other Sister 35       had abnromal finding on mammogram, unsure if cancer. Was told to 'remove part of of body'    Social History   Tobacco Use   Smoking status: Former    Packs/day: 0.50    Years: 13.00    Pack years: 6.50    Types: Cigarettes    Quit date: 10/20/2007    Years since quitting: 12.9   Smokeless tobacco: Never  Vaping Use   Vaping Use: Never used  Substance Use Topics   Alcohol use: Yes    Alcohol/week: 1.0 standard drink    Types: 1 Cans of beer per week    Comment: every 2 to 3 months   Drug use: No     Home Medications Prior to Admission medications   Medication Sig Start Date End Date Taking? Authorizing Provider  doxycycline (VIBRAMYCIN) 100 MG capsule Take 1 capsule (100 mg total) by mouth 2 (two) times daily. 09/12/20  Yes Isla Pence, MD  ibuprofen (ADVIL) 600 MG tablet Take 1 tablet (600 mg total) by mouth every 6 (six) hours as needed. 09/12/20  Yes Isla Pence, MD  traMADol (ULTRAM) 50 MG tablet Take 1 tablet (50 mg total) by mouth every 6 (six) hours as needed. 09/12/20  Yes Isla Pence, MD  Cholecalciferol (VITAMIN D3) 25 MCG (1000 UT) CAPS Take 1,000 Units by mouth daily.    [provider]  cyclobenzaprine (FLEXERIL) 10 MG tablet Take 1 tablet (10 mg total) by mouth 2 (two) times daily as needed for muscle spasms. Patient not taking: Reported on 08/26/2020 05/14/20   Tegeler, Gwenyth Allegra, MD  FLUoxetine (PROZAC) 40 MG capsule Take 40 mg by mouth daily.    [provider]  furosemide (LASIX) 40 MG tablet Take 40 mg by mouth 2 (two) times daily. Patient not taking: Reported on 08/26/2020 03/03/20   [provider]  levothyroxine (SYNTHROID) 100 MCG tablet Take 100 mcg by mouth daily before breakfast.    [provider]  lidocaine (LIDODERM) 5 % Place 1 patch onto the skin daily. Remove & Discard patch within 12 hours or as directed by MD Patient not taking: Reported on 08/26/2020 05/14/20   Tegeler, Gwenyth Allegra, MD    Allergies    Oxycodone and Hydrocodone  Review of Systems   Review of Systems  Respiratory:  Positive for cough.   All other systems reviewed and are negative.  Physical Exam Updated Vital Signs BP (!) 148/66 (BP Location: Left Arm)   Pulse (!) 58   Temp 98 F (36.7 C) (Oral)   Resp 18   Ht 5' (1.524 m)   Wt 80.3 kg   SpO2 99%   BMI 34.57 kg/m   Physical Exam Vitals and nursing note reviewed.  Constitutional:      Appearance: Normal appearance.  HENT:     Head: Normocephalic and atraumatic.     Right  Ear: External ear normal.     Left Ear: External ear normal.     Nose: Nose normal.     Mouth/Throat:     Mouth: Mucous membranes are moist.     Pharynx: Oropharynx is clear.  Eyes:     Extraocular Movements: Extraocular movements intact.     Conjunctiva/sclera: Conjunctivae normal.     Pupils: Pupils are  equal, round, and reactive to light.  Cardiovascular:     Rate and Rhythm: Normal rate and regular rhythm.     Pulses: Normal pulses.     Heart sounds: Normal heart sounds.  Pulmonary:     Effort: Pulmonary effort is normal.     Breath sounds: Normal breath sounds.  Abdominal:     General: Abdomen is flat. Bowel sounds are normal.     Palpations: Abdomen is soft.  Musculoskeletal:        General: Normal range of motion.     Cervical back: Normal range of motion and neck supple.  Skin:    General: Skin is warm.     Capillary Refill: Capillary refill takes less than 2 seconds.  Neurological:     General: No focal deficit present.     Mental Status: She is alert and oriented to person, place, and time.  Psychiatric:        Mood and Affect: Mood normal.        Behavior: Behavior normal.        Thought Content: Thought content normal.        Judgment: Judgment normal.    ED Results / Procedures / Treatments   Labs (all labs ordered are listed, but only abnormal results are displayed) Labs Reviewed  RESP PANEL BY RT-PCR (FLU A&B, COVID) [id number]    EKG None  Radiology DG Chest Portable 1 View  Result Date: 09/12/2020 CLINICAL DATA:  Cough and chest congestion.  Chest pain. EXAM: PORTABLE CHEST 1 VIEW COMPARISON:  1922 and CT chest 05/14/2020. FINDINGS: Trachea is midline. Heart size stable. No airspace consolidation or pleural fluid. IMPRESSION: No acute findings. Electronically Signed   By: Lorin Picket M.D.   On: 09/12/2020 10:59    Procedures Procedures   Medications Ordered in ED Medications  doxycycline (VIBRA-TABS) tablet 100 mg (has no administration in time  range)    ED Course  I have reviewed the triage vital signs and the nursing notes.  Pertinent labs & imaging results that were available during my care of the patient were reviewed by me and considered in my medical decision making (see chart for details).    MDM Rules/Calculators/A&P                         Covid negative.  CXR clear.  Sx have been going on for several days, so she will be d/c with doxy.  She is to return if worse.  F/u with pcp.  Ashley Villegas was evaluated in Emergency Department on 09/12/2020 for the symptoms described in the history of present illness. She was evaluated in the context of the global COVID-19 pandemic, which necessitated consideration that the patient might be at risk for infection with the SARS-CoV-2 virus that causes COVID-19. Institutional protocols and algorithms that pertain to the evaluation of patients at risk for COVID-19 are in a state of rapid change based on information released by regulatory bodies including the CDC and federal and state organizations. These policies and algorithms were followed during the patient's care in the ED.  Final Clinical Impression(s) / ED Diagnoses Final diagnoses:  Acute bronchitis, unspecified organism    Rx / DC Orders ED Discharge Orders          Ordered    doxycycline (VIBRAMYCIN) 100 MG capsule  2 times daily        09/12/20 1144    traMADol (ULTRAM) 50 MG tablet  Every 6 hours PRN        09/12/20 1144    ibuprofen (ADVIL) 600 MG tablet  Every 6 hours PRN        09/12/20 1144             Isla Pence, MD 09/12/20 1145

## 2020-09-12 NOTE — ED Triage Notes (Addendum)
Patient c/o a non productive cough and chest congestion x 5 days. Patient states she has a sharp pain when she coughs in her chest.

## 2020-12-07 NOTE — Progress Notes (Signed)
Radiation Oncology         [phone] ________________________________  Name: Ashley Villegas MRN: [id number]  Date: 12/08/2020  DOB: November 05, 1950  Follow-Up Visit Note  CC: [name], MD  [name], MD    ICD-10-CM   1. 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 Malignant neoplasm metastatic to lung, unspecified laterality ([facility])  C78.00       Diagnosis:  Recurrent endometrial cancer, original stage IA grade 1, s/p hysterectomy  with enhancing lesion centered along the right levator ani muscle, status post additional radiation therapy   Interval Since Last Radiation:  1 year and 12 days   Radiation Treatment Dates: 11/10/2019 through 11/27/2019 Site Technique Total Dose (Gy) Dose per Fx (Gy) Completed Fx Beam Energies  Perineum: Pelvis IMRT 28/28 2 14/14 6X   Narrative:  The patient returns today for routine follow-up, she was last seen by me for follow-up on 06/02/20. Pertinent imaging since she was last seen includes a bilateral screening mammogram performed on 09/02/20 which demonstrated no evidence of malignancy in either breast.       The patient most recently followed up with Dr. Berline Lopes on 08/30/20. During which time, the patient reported doing well overall, denying  any vaginal bleeding or discharge. The patient did mention some ongoing shortness of breath with activity since her Covid infection this past February, though she reports it has slowly improved over time. Otherwise, Dr. Berline Lopes noted the patient to exhibit no evidence of cancer recurrence.       Of note: the patient presented to the [facility] ED on 09/12/20 with a  5 day history of cough and congestion. Chest x-ray taken during ED course showed no acute findings of concern. ED diagnosis documented was for acute bronchitis, for which the patient was prescribed doxycycline, tramadol, and ibuprofen then discharged home.          Allergies:  is allergic to oxycodone and  hydrocodone.  Meds: Current Outpatient Medications  Medication Sig Dispense Refill   Cholecalciferol (VITAMIN D3) 25 MCG (1000 UT) CAPS Take 1,000 Units by mouth daily.     FLUoxetine (PROZAC) 40 MG capsule Take 40 mg by mouth daily.     ibuprofen (ADVIL) 600 MG tablet Take 1 tablet (600 mg total) by mouth every 6 (six) hours as needed. 30 tablet 0   levothyroxine (SYNTHROID) 100 MCG tablet Take 100 mcg by mouth daily before breakfast.     furosemide (LASIX) 40 MG tablet Take 40 mg by mouth 2 (two) times daily. (Patient not taking: No sig reported)     No current facility-administered medications for this encounter.    Physical Findings: The patient is in no acute distress. Patient is alert and oriented.  height is 5' (1.524 m) and weight is 179 lb 12.8 oz (81.6 kg). Her temperature is 97.8 F (36.6 C). Her blood pressure is 152/64 (abnormal) and her pulse is 59 (abnormal). Her respiration is 20 and oxygen saturation is 99%. .  No significant changes. Lungs are clear to auscultation bilaterally. Heart has regular rate and rhythm. No palpable cervical, supraclavicular, or axillary adenopathy. Abdomen soft, non-tender, normal bowel sounds.  On pelvic examination the external genitalia were unremarkable. A speculum exam was performed. There are no mucosal lesions noted in the vaginal vault. On bimanual and rectovaginal examination there were no pelvic masses appreciated.    Lab Findings: Lab Results  Component Value Date   WBC 3.4 (L) 05/14/2020   HGB  14.0 05/14/2020   HCT 42.7 05/14/2020   MCV 93.6 05/14/2020   PLT 231 05/14/2020    Radiographic Findings: No results found.  Impression:  Recurrent endometrial cancer, original stage IA grade 1, s/p hysterectomy  with enhancing lesion centered along the right levator ani muscle, status post additional radiation therapy  The patient is doing well at this time.  No evidence of recurrence on clinical exam today.  Plan: She will  follow-up in radiation oncology in 6 months.  Prior to this follow-up visit she will follow-up with Dr. Berline Lopes in gynecologic oncology.   22 minutes of total time was spent for this patient encounter, including preparation, face-to-face counseling with the patient and coordination of care, physical exam, and documentation of the encounter. ____________________________________  Blair Promise, PhD, MD   This document serves as a record of services personally performed by Gery Pray, MD. It was created on his behalf by Roney Mans, a trained medical scribe. The creation of this record is based on the scribe's personal observations and the provider's statements to them. This document has been checked and approved by the attending provider.

## 2020-12-08 ENCOUNTER — Encounter: Payer: Self-pay | Admitting: Radiation Oncology

## 2020-12-08 ENCOUNTER — Ambulatory Visit
Admission: RE | Admit: 2020-12-08 | Discharge: 2020-12-08 | Disposition: A | Discharge status: Home / Self Care | Payer: Medicare Other | Source: Ambulatory Visit | Attending: Radiation Oncology | Admitting: Radiation Oncology

## 2020-12-08 ENCOUNTER — Other Ambulatory Visit: Payer: Self-pay

## 2020-12-08 VITALS — BP 152/64 | HR 59 | Temp 97.8°F | Resp 20 | Ht 60.0 in | Wt 179.8 lb

## 2020-12-08 DIAGNOSIS — Z923 Personal history of irradiation: Secondary | ICD-10-CM | POA: Diagnosis not present

## 2020-12-08 DIAGNOSIS — Z79899 Other long term (current) drug therapy: Secondary | ICD-10-CM | POA: Insufficient documentation

## 2020-12-08 DIAGNOSIS — Z8542 Personal history of malignant neoplasm of other parts of uterus: Secondary | ICD-10-CM | POA: Diagnosis present

## 2020-12-08 DIAGNOSIS — C7800 Secondary malignant neoplasm of unspecified lung: Secondary | ICD-10-CM

## 2020-12-08 DIAGNOSIS — C541 Malignant neoplasm of endometrium: Secondary | ICD-10-CM

## 2020-12-08 NOTE — Progress Notes (Signed)
Ashley Villegas is here today for follow up post radiation to the pelvic.  They completed their radiation on: 12/07/19  Does the patient complain of any of the following:  Pain:Patient denies pain.  Abdominal bloating: no Diarrhea/Constipation: no Nausea/Vomiting: no Vaginal Discharge: no Blood in Urine or Stool: no Urinary Issues (dysuria/incomplete emptying/ incontinence/ increased frequency/urgency): no Does patient report using vaginal dilator 2-3 times a week and/or sexually active 2-3 weeks: Patient not using dilator or  sexually active.  Post radiation skin changes: no   Additional comments if applicable:   Vitals:   12/08/20 1117  BP: (!) 152/64  Pulse: (!) 59  Resp: 20  Temp: 97.8 F (36.6 C)  SpO2: 99%  Weight: 179 lb 12.8 oz (81.6 kg)  Height: 5' (1.524 m)

## 2021-02-22 ENCOUNTER — Encounter: Payer: Self-pay | Admitting: Gynecologic Oncology

## 2021-02-24 ENCOUNTER — Inpatient Hospital Stay: Payer: Medicare Other | Attending: Gynecologic Oncology | Admitting: Gynecologic Oncology

## 2021-02-24 ENCOUNTER — Encounter: Payer: Self-pay | Admitting: Gynecologic Oncology

## 2021-02-24 ENCOUNTER — Other Ambulatory Visit: Payer: Self-pay

## 2021-02-24 VITALS — BP 141/64 | HR 73 | Temp 98.1°F | Resp 18 | Ht 60.0 in | Wt 181.4 lb

## 2021-02-24 DIAGNOSIS — Z923 Personal history of irradiation: Secondary | ICD-10-CM | POA: Insufficient documentation

## 2021-02-24 DIAGNOSIS — F419 Anxiety disorder, unspecified: Secondary | ICD-10-CM | POA: Diagnosis not present

## 2021-02-24 DIAGNOSIS — F32A Depression, unspecified: Secondary | ICD-10-CM | POA: Diagnosis not present

## 2021-02-24 DIAGNOSIS — R0600 Dyspnea, unspecified: Secondary | ICD-10-CM

## 2021-02-24 DIAGNOSIS — R062 Wheezing: Secondary | ICD-10-CM | POA: Insufficient documentation

## 2021-02-24 DIAGNOSIS — Z8542 Personal history of malignant neoplasm of other parts of uterus: Secondary | ICD-10-CM | POA: Insufficient documentation

## 2021-02-24 DIAGNOSIS — Z7951 Long term (current) use of inhaled steroids: Secondary | ICD-10-CM | POA: Diagnosis not present

## 2021-02-24 DIAGNOSIS — Z8540 Personal history of malignant neoplasm of unspecified female genital organ: Secondary | ICD-10-CM | POA: Insufficient documentation

## 2021-02-24 DIAGNOSIS — Z90722 Acquired absence of ovaries, bilateral: Secondary | ICD-10-CM | POA: Diagnosis not present

## 2021-02-24 DIAGNOSIS — Z90710 Acquired absence of both cervix and uterus: Secondary | ICD-10-CM | POA: Insufficient documentation

## 2021-02-24 DIAGNOSIS — M542 Cervicalgia: Secondary | ICD-10-CM | POA: Diagnosis not present

## 2021-02-24 DIAGNOSIS — C541 Malignant neoplasm of endometrium: Secondary | ICD-10-CM

## 2021-02-24 DIAGNOSIS — R0602 Shortness of breath: Secondary | ICD-10-CM | POA: Diagnosis not present

## 2021-02-24 DIAGNOSIS — Z9221 Personal history of antineoplastic chemotherapy: Secondary | ICD-10-CM | POA: Insufficient documentation

## 2021-02-24 DIAGNOSIS — Z8616 Personal history of COVID-19: Secondary | ICD-10-CM | POA: Diagnosis not present

## 2021-02-24 DIAGNOSIS — Z79899 Other long term (current) drug therapy: Secondary | ICD-10-CM | POA: Diagnosis not present

## 2021-02-24 DIAGNOSIS — Z87891 Personal history of nicotine dependence: Secondary | ICD-10-CM | POA: Insufficient documentation

## 2021-02-24 DIAGNOSIS — Z6835 Body mass index (BMI) 35.0-35.9, adult: Secondary | ICD-10-CM

## 2021-02-24 DIAGNOSIS — Z85118 Personal history of other malignant neoplasm of bronchus and lung: Secondary | ICD-10-CM | POA: Insufficient documentation

## 2021-02-24 NOTE — Patient Instructions (Signed)
It was good to see you today.  I do not see or feel any evidence of cancer recurrence on your exam.  Given your continued and worsening shortness of breath, I have ordered a chest x-ray.  I have also ordered a ultrasound of your neck to see if there is a reason for the pain that you have been having.  I will call you with the results of both of these.  I will see you in 6 months from a cancer standpoint.  If you develop any new or concerning symptoms between now and your visit with me, please call to see me sooner.

## 2021-02-24 NOTE — Progress Notes (Signed)
Gynecologic Oncology Return Clinic Visit  02/24/2021  Reason for Visit: Surveillance visit in the setting of recurrent uterine cancer  Treatment History: Oncology History Overview Note  Abnormal MSI, germline mutation was negative Endometrioid   Endometrial cancer ([place])  09/15/2014 Pathology Results   Endometrium, curettage - ENDOMETRIAL ADENOCARCINOMA. - SEE COMMENT. Microscopic Comment Although definitive characterization is best performed on resection specimen, as sampled, the endometrial adenocarcinoma appears to be endometrioid subtype, FIGO grade 1.    09/15/2014 Surgery   PreOp: postmenopausal bleeding, cervical stenosis PostOp: same and uterine polyp Procedure:  Hysteroscopy, Dilation and Curettage, Myosure polypectomy Surgeon: Dr. Janyth Pupa   Findings:8cm uterus with thickened polypoid-like endometrium, large 2cm irregular appearing polyp   Specimens: 1) endometrial curettings   10/26/2014 Pathology Results   1. Uterus +/- tubes/ovaries, neoplastic - INVASIVE ADENOCARCINOMA, ENDOMETRIOID TYPE, SEE COMMENT. - TUMOR INVOLVES LESS THAN ONE HALF MYOMETRIAL THICKNESS. - TUMOR INVOLVES UTERINE ADENOMYOSIS. - BENIGN LEIOMYOMATA (UP TO 2.5 CM). - BENIGN CERVICAL MUCOSA; NEGATIVE FOR INTRAEPITHELIAL LESION OR MALIGNANCY. - BENIGN RIGHT AND LEFT OVARIES; NEGATIVE FOR ATYPIA OR MALIGNANCY. - BENIGN RIGHT AND LEFT FALLOPIAN TUBE; NEGATIVE FOR ATYPIA OR MALIGNANCY. - BENIGN PARATUBAL CYSTS; NEGATIVE FOR ATYPIA OR MALIGNANCY. - SEE TUMOR SYNOPTIC TEMPLATE BELOW. 2. Lymph nodes, regional resection, right pelvic - FOUR LYMPH NODES, NEGATIVE FOR TUMOR (0/4). 3. Lymph nodes, regional resection, left pelvic - FOUR LYMPH NODES, NEGATIVE FOR TUMOR (0/4). Microscopic Comment 1. ONCOLOGY TABLE-UTERUS, CARCINOMA OR CARCINOSARCOMA Specimen: Uterus and bilateral fallopian tubes and ovaries. Procedure: Hysterectomy and bilateral salpingo-oophorectomy. Lymph node sampling performed:  Yes. Specimen integrity: Intact. Maximum tumor size: 4.0 cm (tumor involved entire endometrium) Histologic type: Adenocarcinoma, endometrioid type. Grade: 1 Myometrial invasion: 0.5 cm where myometrium is 1.5 cm in thickness Cervical stromal involvement: Absent. Extent of involvement of other organs: None Lymph - vascular invasion: Absent. Peritoneal washings: N/A Lymph nodes: # examined - 8 ; # positive - 0 Pelvic lymph nodes: N/A involved of N/A lymph nodes. Para-aortic lymph nodes: N/A involved of N/A lymph nodes. Other (specify involvement and site): N/A TNM code: pT1a, pN0 FIGO Stage (based on pathologic findings, needs clinical correlation): N/A Comments: None MSI testing abnormal   10/26/2014 Surgery   Surgery: Total robotic hysterectomy, bilateral salpingo-oophorectomy, bilateral pelvic lymph node dissection.    Surgeons:  Lucita Lora. Alycia Rossetti, MD; Lahoma Crocker, MD      Pathology: Uterus, cervix, bilateral tubes and ovaries, bilateral pelvic lymph nodes to pathology.    Operative findings: Omentum adherent to midline vertical incision. Fibroid uterus. Normal adnexa. Adhesions of anterior bladder to uterus. Frozen section with grade 1 cancer focally involving adenomyosis, minimal myometrial invasion.   11/17/2014 Imaging   Ct abdomen 1. Status post hysterectomy. Pelvic edema which is greater than typically seen 3 weeks postop. Suspicious for postoperative infection. 2. Bladder wall thickening and irregularity. Although this could be partially due to underdistention, Concurrent cystitis cannot be excluded. 3. Bilateral fluid density lesions along the pelvic sidewalls. Favored to represent seromas or lymphangiomas. No specific features to suggest abscess. If the patient's symptoms persist after appropriate antibiotic therapy, aspiration of the largest left pelvic sidewall "Lesion" should be considered. 4. Suspicion of mild hepatic steatosis. Indeterminate right hepatic lobe  lesion. If definitive characterization is desired in this patient with history of primary malignancy, nonemergent pre and post contrast abdominal MRI could be performed. 5. Hiatal hernia.   11/25/2014 Imaging   1. Persistent bilateral pelvic sidewall fluid collections, left greater than right, with left-sided inflammatory changes.  2. Technically successful 12 French left pelvic abscess drain catheter placement. A sample of the aspirate was sent for Gram stain, culture and sensitivity.   12/08/2014 Imaging   CT abdomen and pelvis 1. Complete resolution of dominant fluid collection within the left hemipelvis following percutaneous drainage catheter placement. This percutaneous drainage catheter was subsequent removed intact at the patient's bedside. 2. Continued decrease in size of bilobed fluid collections within the right hemipelvis with superficial component measuring 1.6 cm and posterior component measuring 2.6 cm, indeterminate though both favored to represent evolving seromas. 3. Unchanged indeterminate approximately 1.2 cm hepatic lesion. Further evaluation with contrast-enhanced abdominal MRI could be performed as clinically indicated. 4. Colonic diverticulosis without evidence of diverticulitis.   10/19/2016 Imaging   New 2.6 cm soft tissue mass involving right vaginal cuff, consistent with locally recurrent carcinoma.   No other sites of metastatic disease identified.   Colonic diverticulosis, without radiographic evidence of diverticulitis.   Mild hepatic steatosis.     10/24/2016 Relapse/Recurrence   Vaginal cuff recurrence. No evidence of distant disease.   10/24/2016 Pathology Results   Vagina, biopsy, right apex - ADENOCARCINOMA - SEE COMMENT Microscopic Comment The morphologic features are consistent with the patient's previously diagnosed endometrioid adenocarcinoma   11/08/2016 - 12/27/2016 Radiation Therapy   45 Gy in 25 fractions of 1.8 Gy. The residual pelvic mass was  boosted to 14 Gy in 7 fractions of 2 Gy. No intracavitary lesion seen at completion of pelvic fields to boost with brachytherapy.   06/12/2017 Imaging   CT abdomen and pelvis 1. New 1.1 by 0.8 cm right lower lobe pulmonary nodule, concerning for pulmonary metastatic disease. Possibilities for further assessment include biopsy or nuclear medicine PET-CT to assess this lesion and the rest of the neck/chest/abdomen/pelvis for other potential hypermetabolic lesions. 2. The previous soft tissue mass along the right vaginal cuff is no longer present. 3. Stable and likely benign right hepatic lobe hypodense lesion. 4. Other imaging findings of potential clinical significance: Sigmoid colon diverticulosis. Aortic Atherosclerosis (ICD10-I70.0). Multilevel lumbar degenerative disc disease. Mild cardiomegaly. Small type 1 hiatal hernia.   07/05/2017 Imaging   Ct chest Several bilateral pulmonary nodules measuring up to 11 mm, highly suspicious for pulmonary metastases.    No evidence of lymphadenopathy or pleural effusion.   Aortic Atherosclerosis (ICD10-I70.0).   07/25/2017 Imaging   Status post CT-guided lung nodule biopsy. Tissue specimen sent to pathology for complete histopathologic analysis.   07/25/2017 Pathology Results   Lung, needle/core biopsy(ies), RLL - METASTATIC ADENOCARCINOMA - SEE COMMENT Microscopic Comment By immunohistochemistry, the neoplastic cells are positive for Pax-8 and ER but negative for TTF-1. Overall, this immunoprofile is consistent with metastasis from the patient's known endometrioid adenocarcinoma   07/30/2017 Cancer Staging   Staging form: Corpus Uteri - Carcinoma and Carcinosarcoma, AJCC 8th Edition - Clinical: Stage IVB (rcT1, cN0, pM1) - Signed by Heath Lark, MD on 07/30/2017    08/09/2017 - 11/21/2017 Chemotherapy   The patient had pembrolizumab for chemotherapy treatment.    10/10/2017 Imaging   1. Interval decrease in size and contour. The of previously  described right and left lower lobe pulmonary nodules. 2. Interval increase in size of mediastinal and hilar adenopathy, potentially metastatic in etiology. 3. Aortic Atherosclerosis (ICD10-I70.0).   11/08/2017 - 11/10/2017 Hospital Admission   She was admitted to the hospital due to uncontrolled pain   11/29/2017 Imaging   Chest Impression:  No thoracic metastasis.  No discrete pulmonary nodules identified.  Abdomen / Pelvis Impression:  No evidence of local recurrence or metastatic endometrial carcinoma in the abdomen pelvis.   02/21/2018 Imaging   1. No findings to suggest metastatic disease in the chest, abdomen or pelvis. 2. Mild colonic diverticulosis without evidence of acute diverticulitis at this time. 3. Aortic atherosclerosis. 4. Additional incidental findings, as above.   03/10/2018 Imaging   Bone scan: No scintigraphic evidence of osseous metastatic disease.   05/22/2018 Imaging   1. Stable exam. No evidence of recurrent or metastatic carcinoma within the chest, abdomen, or pelvis. 2. Colonic diverticulosis, without radiographic evidence of diverticulitis.   11/13/2018 Imaging   1. No evidence of recurrent or metastatic carcinoma within the chest, abdomen, or pelvis. 2. Colonic diverticulosis, without radiographic evidence of diverticulitis. 3. Stable hepatic steatosis.   Aortic Atherosclerosis (ICD10-I70.0).   05/22/2019 Imaging   1. No evidence of recurrent or metastatic disease in the chest, abdomen, or pelvis status post hysterectomy. 2. Previously seen metastatic pulmonary nodules in the bilateral lower lobes remain resolved. 3. Unchanged irregular opacities of the peripheral right upper lobe, likely chronic sequelae of prior infection or inflammation. 4. Hepatic steatosis. 5. Sigmoid diverticulosis. 6. Aortic Atherosclerosis (ICD10-I70.0).     10/18/2019 Imaging   1. Heterogeneously enhancing lesion measuring 2.4 x 1.9 x 1.9 cm immediately to the right of the  urethra along the anterolateral aspect of the vaginal wall, likely centered in the right levator ani 9 musculature. This has a malignant appearance, but whether this represents a new primary lesion or a metastatic lesion is uncertain on today's examination. 2. Mild colonic diverticulosis.     11/02/2019 Imaging   CT chest 1. No evidence of metastatic disease. 2. Hepatic steatosis. 3.  Aortic atherosclerosis (ICD10-I70.0).   11/10/2019 - 11/27/2019 Radiation Therapy   Radiation Treatment Dates: 11/10/2019 through 11/27/2019 Site Technique Total Dose (Gy) Dose per Fx (Gy) Completed Fx Beam Energies  Perineum: Pelvis IMRT 28/28 2 14/14 6X      01/21/2020 Imaging   1. Complete resolution of the heterogeneously enhancing lesion seen along the right vaginal introitus on the prior study. No residual measurable lesion at this location today. No new or progressive findings in the pelvis/pelvic floor on today's exam. 2. Left colonic diverticulosis without features of diverticulitis.   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     Metastasis to lung ([place])  07/30/2017 Initial Diagnosis   Metastasis to lung ([place])   08/09/2017 - 11/01/2017 Chemotherapy   The patient had pembrolizumab (KEYTRUDA) 200 mg in sodium chloride 0.9 % 50 mL chemo infusion, 200 mg, Intravenous, Once, 5 of 7 cycles Administration: 200 mg (08/09/2017), 200 mg (08/30/2017), 200 mg (09/20/2017), 200 mg (10/11/2017), 200 mg (11/01/2017)   for chemotherapy treatment.       Interval History: Patient presents today for surveillance visit.  She notes overall doing  well.  She denies any vaginal bleeding or discharge.  She denies abdominal or pelvic pain.  She endorses normal bowel and bladder function.  She endorses a good appetite and denies any nausea or emesis.  At her last visit with me, she was having some shortness of breath and wheezing.  These were symptoms  that started after a COVID infection.  She continues to have shortness of breath with ambulation and when she does housework.  She is now trying to be more active and joined a water aerobics class.  She gets short of breath with exercise.  Her wheezing has improved some after her primary care provider gave her an inhaler for her pulmonary symptoms.  She notes having some pain along the left side of her neck.  Is unsure whether she can sometimes feel a bump there.  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, recurrent    Anxiety    Arthritis    Carpal tunnel syndrome, bilateral upper limbs    Complication of anesthesia    Depression    Family history of uterine cancer    GERD (gastroesophageal reflux disease)    History of radiation therapy 11/08/2016-12/27/2016   pelvis 45 Gy in 25 fractions, pelvis boost 14 gy in 7 fractions   Hypothyroidism    on synthroid    PONV (postoperative nausea and vomiting)     Past Surgical History:  Procedure Laterality Date   CARPAL TUNNEL RELEASE Bilateral 2020   CESAREAN SECTION     COLONOSCOPY     DIAGNOSTIC LAPAROSCOPY     fibroid tumors on her ovaries    DILATION AND CURETTAGE OF UTERUS     HAND SURGERY Right 2019   carpal tunnel   HYSTEROSCOPY WITH D & C N/A 09/15/2014   Procedure: DILATATION AND CURETTAGE /HYSTEROSCOPY/MYOSURE RESECTION OF POLYP;  Surgeon: Janyth Pupa, DO;  Location: [facility];  Service: Gynecology;  Laterality: N/A;   KNEE SURGERY     LYMPH NODE DISSECTION N/A 10/26/2014   Procedure: POSS LYMPHADENECTOMY ;  Surgeon: [name], MD;   Location: [facility];  Service: Gynecology;  Laterality: N/A;   ROBOTIC ASSISTED TOTAL HYSTERECTOMY WITH BILATERAL SALPINGO OOPHERECTOMY Bilateral 10/26/2014   Procedure: ROBOTIC ASSISTED TOTAL HYSTERECTOMY WITH BILATERAL SALPINGO OOPHORECTOMY;  Surgeon: [name], MD;  Location: [facility];  Service: Gynecology;  Laterality: Bilateral;   TOTAL KNEE ARTHROPLASTY Left 04/15/2019   Procedure: LEFT TOTAL KNEE ARTHROPLASTY;  Surgeon: Melrose Nakayama, MD;  Location: [facility];  Service: Orthopedics;  Laterality: Left;   TUBAL LIGATION     WISDOM TOOTH EXTRACTION      Family History  Problem Relation Age of Onset   Breast cancer Maternal Aunt        dx late 50's/60's, died in 75's   Uterine cancer Mother 72   Heart failure Mother    Dementia Father    Heart attack Paternal Grandfather 5   Other Sister 28       had abnromal finding on mammogram, unsure if cancer. Was told to 'remove part of of body'    Social History   Socioeconomic History   Marital status: Single    Spouse name: Not on file   Number of children: 2   Years of education: Not on file   Highest education level: Not on file  Occupational History   Occupation: customer service  Tobacco Use   Smoking status: Former    Packs/day: 0.50    Years: 13.00    Pack years: 6.50    Types: Cigarettes    Quit date: 10/20/2007    Years since quitting: 13.3   Smokeless tobacco: Never  Vaping Use   Vaping Use: Never used  Substance and Sexual Activity   Alcohol  use: Yes    Alcohol/week: 1.0 standard drink    Types: 1 Cans of beer per week    Comment: every 2 to 3 months   Drug use: No   Sexual activity: Not on file  Other Topics Concern   Not on file  Social History Narrative   Not on file   Social Determinants of Health   Financial Resource Strain: Not on file  Food Insecurity: Not on file  Transportation Needs: Not on file  Physical Activity: Not on file  Stress: Not on file  Social Connections: Not on file    Current  Medications:  Current Outpatient Medications:    acetaminophen (TYLENOL) 500 MG tablet, Take 500 mg by mouth every 6 (six) hours as needed., Disp: , Rfl:    albuterol (VENTOLIN HFA) 108 (90 Base) MCG/ACT inhaler, SMARTSIG:2 Puff(s) Via Inhaler 4 Times Daily PRN, Disp: , Rfl:    Cholecalciferol (VITAMIN D3) 25 MCG (1000 UT) CAPS, Take 1,000 Units by mouth daily., Disp: , Rfl:    FLUoxetine (PROZAC) 40 MG capsule, Take 40 mg by mouth daily., Disp: , Rfl:    furosemide (LASIX) 40 MG tablet, Take 40 mg by mouth 2 (two) times daily., Disp: , Rfl:    levothyroxine (SYNTHROID) 100 MCG tablet, Take 100 mcg by mouth daily before breakfast., Disp: , Rfl:    ibuprofen (ADVIL) 600 MG tablet, Take 1 tablet (600 mg total) by mouth every 6 (six) hours as needed., Disp: 30 tablet, Rfl: 0  Review of Systems: Pertinent positives include appetite changes, fatigue, shortness of breath, wheezing, leg swelling, hot flashes, depression. Denies fevers, chills, unexplained weight changes. Denies hearing loss, neck lumps or masses, mouth sores, ringing in ears or voice changes. Denies cough. Denies chest pain or palpitations.  Denies abdominal distention, pain, blood in stools, constipation, diarrhea, nausea, vomiting, or early satiety. Denies pain with intercourse, dysuria, frequency, hematuria or incontinence. Denies pelvic pain, vaginal bleeding or vaginal discharge.   Denies joint pain, back pain or muscle pain/cramps. Denies itching, rash, or wounds. Denies dizziness, headaches, numbness or seizures. Denies swollen lymph nodes or glands, denies easy bruising or bleeding. Denies anxiety, confusion, or decreased concentration.  Physical Exam: BP (!) 141/64 (BP Location: Left Arm, Patient Position: Sitting)   Pulse 73   Temp 98.1 F (36.7 C) (Tympanic)   Resp 18   Ht 5' (1.524 m)   Wt 181 lb 6.4 oz (82.3 kg)   SpO2 98%   BMI 35.43 kg/m  General: Alert, oriented, no acute distress. HEENT: Normocephalic,  atraumatic, sclera anicteric.  There is a less than 1 cm tender area along the left neck near her thyroid, possibly shoddy lymph node. Chest: Clear to auscultation bilaterally.  No wheezes or rhonchi. Cardiovascular: Regular rate and rhythm, no murmurs. Abdomen: Obese, soft, nontender.  Normoactive bowel sounds.  No masses or hepatosplenomegaly appreciated.  Extremities: Grossly normal range of motion.  Warm, well perfused.  No edema bilaterally. Skin: No rashes or lesions noted. Lymphatics: No supraclavicular, or inguinal adenopathy. GU: Normal appearing external genitalia without erythema, excoriation, or lesions.  Speculum exam reveals atrophic vaginal mucosa, no lesions or masses.  Cuff intact on bimanual exam, no masses or nodularity.  Rectovaginal exam confirms these findings.  Laboratory & Radiologic Studies: None new  Assessment & Plan: Ashley Villegas is a 70 y.o. woman with with recurrent uterine cancer, most recently treated for para-vaginal lesion in 2021 with RT with complete disease resolution, presenting for surveillance visit.  The patient is overall doing very well and is without evidence of recurrent disease on exam today.    Given her continued breathing symptoms, I offered that we could get a chest x-ray.  I still think it is very unlikely that her symptoms are related to metastatic disease and her CT of her chest around the time of her COVID infection this past February did not show evidence of metastatic disease.  Given pain in her neck and somewhat firm area appreciated on exam, I have ordered a ultrasound of her neck.  I will follow-up on the results with her once images back.  She is working on increasing her activity and continues to be interested in weight loss.  She and I have previously discussed weight loss strategies.   We reviewed again plan for surveillance including visits every 3 months, alternating between radiation oncology and our clinic.  We also  reviewed signs and symptoms that would be concerning for disease recurrence and the patient knows to call the clinic if she develops any of these before her next scheduled visit.  38 minutes of total time was spent for this patient encounter, including preparation, face-to-face counseling with the patient and coordination of care, and documentation of the encounter.  Jeral Pinch, MD  Division of Gynecologic Oncology  Department of Obstetrics and Gynecology  [facility]

## 2021-03-01 ENCOUNTER — Ambulatory Visit (HOSPITAL_COMMUNITY)
Admission: RE | Admit: 2021-03-01 | Discharge: 2021-03-01 | Disposition: A | Discharge status: Home / Self Care | Payer: Medicare Other | Source: Ambulatory Visit | Attending: Gynecologic Oncology | Admitting: Gynecologic Oncology

## 2021-03-01 DIAGNOSIS — M542 Cervicalgia: Secondary | ICD-10-CM | POA: Insufficient documentation

## 2021-03-01 DIAGNOSIS — R0600 Dyspnea, unspecified: Secondary | ICD-10-CM | POA: Insufficient documentation

## 2021-03-02 ENCOUNTER — Telehealth: Payer: Self-pay

## 2021-03-02 NOTE — Telephone Encounter (Signed)
Spoke with Ashley Villegas this morning and reviewed ultrasound results. Per Dr. Berline Lopes neck ultrasound did not show any abnormalities. Patient verbalized understanding and happy with news. Instructed to call with questions or concerns.

## 2021-05-29 DIAGNOSIS — E119 Type 2 diabetes mellitus without complications: Secondary | ICD-10-CM | POA: Diagnosis not present

## 2021-05-29 DIAGNOSIS — E039 Hypothyroidism, unspecified: Secondary | ICD-10-CM | POA: Diagnosis not present

## 2021-06-01 ENCOUNTER — Encounter: Payer: Self-pay | Admitting: Radiology

## 2021-06-02 ENCOUNTER — Telehealth: Payer: Self-pay | Admitting: *Deleted

## 2021-06-02 NOTE — Telephone Encounter (Signed)
CALLED PATIENT TO ALTER FU APPT. ON 06-08-21 DUE TO DR. KINARD BEING IN THE OR, RESCHEDULED FOR 07-06-21 @ 8:45 AM, LVM FOR A RETURN CALL ?

## 2021-06-05 DIAGNOSIS — E039 Hypothyroidism, unspecified: Secondary | ICD-10-CM | POA: Diagnosis not present

## 2021-06-08 ENCOUNTER — Ambulatory Visit: Payer: 59 | Admitting: Radiation Oncology

## 2021-06-12 DIAGNOSIS — R739 Hyperglycemia, unspecified: Secondary | ICD-10-CM | POA: Diagnosis not present

## 2021-06-12 DIAGNOSIS — R0609 Other forms of dyspnea: Secondary | ICD-10-CM | POA: Diagnosis not present

## 2021-06-20 ENCOUNTER — Encounter: Payer: Self-pay | Admitting: Cardiology

## 2021-06-20 ENCOUNTER — Other Ambulatory Visit: Payer: Self-pay

## 2021-06-20 ENCOUNTER — Ambulatory Visit (INDEPENDENT_AMBULATORY_CARE_PROVIDER_SITE_OTHER): Payer: Medicare Other

## 2021-06-20 ENCOUNTER — Ambulatory Visit (INDEPENDENT_AMBULATORY_CARE_PROVIDER_SITE_OTHER): Payer: Medicare Other | Admitting: Cardiology

## 2021-06-20 VITALS — BP 148/84 | HR 59 | Ht 60.0 in | Wt 182.0 lb

## 2021-06-20 DIAGNOSIS — Z79899 Other long term (current) drug therapy: Secondary | ICD-10-CM

## 2021-06-20 DIAGNOSIS — R0609 Other forms of dyspnea: Secondary | ICD-10-CM

## 2021-06-20 DIAGNOSIS — R0789 Other chest pain: Secondary | ICD-10-CM | POA: Diagnosis not present

## 2021-06-20 DIAGNOSIS — Z13220 Encounter for screening for lipoid disorders: Secondary | ICD-10-CM

## 2021-06-20 DIAGNOSIS — R072 Precordial pain: Secondary | ICD-10-CM

## 2021-06-20 DIAGNOSIS — R002 Palpitations: Secondary | ICD-10-CM

## 2021-06-20 DIAGNOSIS — R079 Chest pain, unspecified: Secondary | ICD-10-CM | POA: Diagnosis not present

## 2021-06-20 DIAGNOSIS — Z7689 Persons encountering health services in other specified circumstances: Secondary | ICD-10-CM | POA: Diagnosis not present

## 2021-06-20 MED ORDER — METOPROLOL TARTRATE 25 MG PO TABS: ORAL_TABLET | ORAL | 0 refills | Status: DC

## 2021-06-20 NOTE — Progress Notes (Signed)
?Cardiology Office Note:   ? ?Date:  06/20/2021  ? ?ID:  Ashley Villegas, DOB 1951/03/04, MRN [id number] ? ?PCP:  [name], MD  ?Cardiologist:  Berniece Salines, DO  ?Electrophysiologist:  None  ? ?Referring MD: [name], MD  ? ?" I am doing fine" ? ?History of Present Illness:   ? ?Ashley Villegas is a 71 y.o. 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  The patient tells me that she had been experiencing shortness of breath over the last few months months that has worsened.  She notes that she got COVID back in 2022 this is when all started.  Recently this has gotten worse and she is gotten significantly fatigued.  She also notes that she had some chest discomfort.  She describes it as a midsternal discomfort is intermittent nothing makes it better or worse. ? ? ?Past Medical History:  ?Diagnosis Date  ? #[id number] dx'd 2016, 09/2019  ? 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 Anxiety   ? Arthritis   ? Carpal tunnel syndrome, bilateral upper limbs   ? Complication of anesthesia   ? Depression   ? Family history of uterine cancer   ? GERD (gastroesophageal reflux disease)   ? History of radiation therapy 11/08/2016-12/27/2016  ? pelvis 45 Gy in 25 fractions, pelvis boost 14 gy in 7 fractions  ? History of radiation therapy   ? endometrial - pelvis IMRT 11/10/2019-11/27/2019  ? Hypothyroidism   ? on synthroid   ? PONV (postoperative nausea and vomiting)   ? ? ?Past Surgical History:  ?Procedure Laterality Date  ? CARPAL TUNNEL RELEASE Bilateral 2020  ? CESAREAN SECTION    ? COLONOSCOPY    ? DIAGNOSTIC LAPAROSCOPY    ? fibroid tumors on her ovaries   ? DILATION AND CURETTAGE OF UTERUS    ? HAND SURGERY Right 2019  ? carpal tunnel  ? HYSTEROSCOPY WITH D & C N/A 09/15/2014  ? Procedure: DILATATION  AND CURETTAGE /HYSTEROSCOPY/MYOSURE RESECTION OF POLYP;  Surgeon: Janyth Pupa, DO;  Location: [facility];  Service: Gynecology;  Laterality: N/A;  ? KNEE SURGERY    ? LYMPH NODE DISSECTION N/A 10/26/2014  ? Procedure: POSS LYMPHADENECTOMY ;  Surgeon: [name], MD;  Location: [facility];  Service: Gynecology;  Laterality: N/A;  ? ROBOTIC ASSISTED TOTAL HYSTERECTOMY WITH BILATERAL SALPINGO OOPHERECTOMY Bilateral 10/26/2014  ? Procedure: ROBOTIC ASSISTED TOTAL HYSTERECTOMY WITH BILATERAL SALPINGO OOPHORECTOMY;  Surgeon: [name], MD;  Location: [facility];  Service: Gynecology;  Laterality: Bilateral;  ? TOTAL KNEE ARTHROPLASTY Left 04/15/2019  ? Procedure: LEFT TOTAL KNEE ARTHROPLASTY;  Surgeon: Melrose Nakayama, MD;  Location: [facility];  Service: Orthopedics;  Laterality: Left;  ? TUBAL LIGATION    ? WISDOM TOOTH EXTRACTION    ? ? ?Current Medications: ?Current Meds  ?Medication Sig  ? acetaminophen (TYLENOL) 500 MG tablet Take 500 mg by mouth every 6 (six) hours as needed.  ? albuterol (VENTOLIN HFA) 108 (90 Base) MCG/ACT inhaler SMARTSIG:2 Puff(s) Via Inhaler 4 Times Daily PRN  ? Cholecalciferol (VITAMIN D3) 25 MCG (1000 UT) CAPS Take 1,000 Units by mouth daily.  ? FLUoxetine (PROZAC) 40 MG capsule Take 40 mg by mouth daily.  ? furosemide (LASIX) 40 MG tablet Take 40 mg by mouth 2 (two) times daily.  ?  levothyroxine (SYNTHROID) 100 MCG tablet Take 100 mcg by mouth daily before breakfast.  ? [DISCONTINUED] metoprolol tartrate (LOPRESSOR) 25 MG tablet Take 2 hours before CT scan.  ?  ? ?Allergies:   Oxycodone and Hydrocodone  ? ?Social History  ? ?Socioeconomic History  ? Marital status: Single  ?  Spouse name: Not on file  ? Number of children: 2  ? Years of education: Not on file  ? Highest education level: Not on file  ?Occupational History  ? Occupation: customer service  ?Tobacco Use  ? Smoking status: Former  ?  Packs/day: 0.50  ?  Years: 13.00  ?  Pack years: 6.50  ?  Types: Cigarettes  ?  Quit date: 10/20/2007  ?  Years  since quitting: 13.6  ? Smokeless tobacco: Never  ?Vaping Use  ? Vaping Use: Never used  ?Substance and Sexual Activity  ? Alcohol use: Yes  ?  Alcohol/week: 1.0 standard drink  ?  Types: 1 Cans of beer per week  ?  Comment: every 2 to 3 months  ? Drug use: No  ? Sexual activity: Not on file  ?Other Topics Concern  ? Not on file  ?Social History Narrative  ? Not on file  ? ?Social Determinants of Health  ? ?Financial Resource Strain: Not on file  ?Food Insecurity: Not on file  ?Transportation Needs: Not on file  ?Physical Activity: Not on file  ?Stress: Not on file  ?Social Connections: Not on file  ?  ? ?Family History: ?The patient's family history includes Breast cancer in her maternal aunt; Dementia in her father; Heart attack (age of onset: 72) in her paternal grandfather; Heart failure in her mother; Other (age of onset: 44) in her sister; Uterine cancer (age of onset: 47) in her mother. ? ?ROS:   ?Review of Systems  ?Constitution: Negative for decreased appetite, fever and weight gain.  ?HENT: Negative for congestion, ear discharge, hoarse voice and sore throat.   ?Eyes: Negative for discharge, redness, vision loss in right eye and visual halos.  ?Cardiovascular: Reports chest pain, dyspnea on exertion, and palpitations. Negative for leg swelling, orthopnea. ?Respiratory: Negative for cough, hemoptysis, shortness of breath and snoring.   ?Endocrine: Negative for heat intolerance and polyphagia.  ?Hematologic/Lymphatic: Negative for bleeding problem. Does not bruise/bleed easily.  ?Skin: Negative for flushing, nail changes, rash and suspicious lesions.  ?Musculoskeletal: Negative for arthritis, joint pain, muscle cramps, myalgias, neck pain and stiffness.  ?Gastrointestinal: Negative for abdominal pain, bowel incontinence, diarrhea and excessive appetite.  ?Genitourinary: Negative for decreased libido, genital sores and incomplete emptying.  ?Neurological: Negative for brief paralysis, focal weakness,  headaches and loss of balance.  ?Psychiatric/Behavioral: Negative for altered mental status, depression and suicidal ideas.  ?Allergic/Immunologic: Negative for HIV exposure and persistent infections.  ? ? ?EKGs/Labs/Other Studies Reviewed:   ? ?The following studies were reviewed today: ? ? ?EKG:  The ekg ordered today demonstrates sinus bradycardia ? ?Recent Labs: ?No results found for requested labs within last 8760 hours.  ?Recent Lipid Panel ?No results found for: CHOL, TRIG, HDL, CHOLHDL, VLDL, LDLCALC, LDLDIRECT ? ?Physical Exam:   ? ?VS:  BP (!) 148/84 (BP Location: Right Arm, Patient Position: Sitting, Cuff Size: Normal)   Pulse (!) 59   Ht 5' (1.524 m)   Wt 182 lb (82.6 kg)   SpO2 97%   BMI 35.54 kg/m?    ? ?Wt Readings from Last 3 Encounters:  ?06/20/21 182 lb (82.6 kg)  ?02/24/21 181 lb  6.4 oz (82.3 kg)  ?12/08/20 179 lb 12.8 oz (81.6 kg)  ?  ? ?GEN: Well nourished, well developed in no acute distress ?HEENT: Normal ?NECK: No JVD; No carotid bruits ?LYMPHATICS: No lymphadenopathy ?CARDIAC: S1S2 noted,RRR, no murmurs, rubs, gallops ?RESPIRATORY:  Clear to auscultation without rales, wheezing or rhonchi  ?ABDOMEN: Soft, non-tender, non-distended, +bowel sounds, no guarding. ?EXTREMITIES: No edema, No cyanosis, no clubbing ?MUSCULOSKELETAL:  No deformity  ?SKIN: Warm and dry ?NEUROLOGIC:  Alert and oriented x 3, non-focal ?PSYCHIATRIC:  Normal affect, good insight ? ?ASSESSMENT:   ? ?1. Chest pain, unspecified type   ?2. Palpitations   ?3. Chest pressure   ?4. DOE (dyspnea on exertion)   ?5. Medication management   ?6. Encounter to establish care   ?7. Screening for hyperlipidemia   ?8. Precordial pain   ? ?PLAN:   ? ? ?1.  The symptoms of dyspnea and chest pain is concerning, this patient does have intermediate risk for coronary artery disease and at this time I would like to pursue an ischemic evaluation in this patient.  Shared decision a coronary CTA at this time is appropriate.  I have discussed  with the patient about the testing.  The patient has no IV contrast allergy and is agreeable to proceed with this test. ? ?2.  For completeness we will also get an echocardiogram to assess for any LV dysfunction o

## 2021-06-20 NOTE — Patient Instructions (Addendum)
Medication Instructions:  ?Your physician recommends that you continue on your current medications as directed. Please refer to the Current Medication list given to you today.  ?*If you need a refill on your cardiac medications before your next appointment, please call your pharmacy* ? ? ?Lab Work: ?Your physician recommends that you return for lab work in:  ?TODAY: Lipids, Lp(a), BMET, [place] ?If you have labs (blood work) drawn today and your tests are completely normal, you will receive your results only by: ?MyChart Message (if you have MyChart) OR ?A paper copy in the mail ?If you have any lab test that is abnormal or we need to change your treatment, we will call you to review the results. ? ? ?Testing/Procedures: ?Your physician has requested that you have an echocardiogram. Echocardiography is a painless test that uses sound waves to create images of your heart. It provides your doctor with information about the size and shape of your heart and how well your heart?s chambers and valves are working. This procedure takes approximately one hour. There are no restrictions for this procedure. ? ? ? ?Your cardiac CT will be scheduled at one of the below locations:  ? ?[facility] ?[address] ?[place], [place] [address] ?[phone] ? ? ?If scheduled at [facility], please arrive at the [place] and Children's Entrance (Entrance C2) of [facility] 30 minutes prior to test start time. ?You can use the FREE valet parking offered at entrance C (encouraged to control the heart rate for the test)  ?Proceed to the [facility] Radiology Department (first floor) to check-in and test prep. ? ?All radiology patients and guests should use entrance C2 at [facility], accessed from [facility], even though the hospital's physical address listed is [address] ? ? ? ?Please follow these instructions carefully (unless otherwise directed): ? ? ? ?On the Night Before the  Test: ?Be sure to Drink plenty of water. ?Do not consume any caffeinated/decaffeinated beverages or chocolate 12 hours prior to your test. ?Do not take any antihistamines 12 hours prior to your test. ? ?On the Day of the Test: ?Drink plenty of water until 1 hour prior to the test. ?Do not eat any food 4 hours prior to the test. ?You may take your regular medications prior to the test.  ?HOLD Furosemide morning of the test. ?FEMALES- please wear underwire-free bra if available, avoid dresses & tight clothing ? ?     ?After the Test: ?Drink plenty of water. ?After receiving IV contrast, you may experience a mild flushed feeling. This is normal. ?On occasion, you may experience a mild rash up to 24 hours after the test. This is not dangerous. If this occurs, you can take Benadryl 25 mg and increase your fluid intake. ?If you experience trouble breathing, this can be serious. If it is severe call 911 IMMEDIATELY. If it is mild, please call our office. ?If you take any of these medications: Glipizide/Metformin, Avandament, Glucavance, please do not take 48 hours after completing test unless otherwise instructed. ? ?We will call to schedule your test 2-4 weeks out understanding that some insurance companies will need an authorization prior to the service being performed.  ? ?For non-scheduling related questions, please contact the cardiac imaging nurse navigator should you have any questions/concerns: ?Marchia Bond, Cardiac Imaging Nurse Navigator ?Gordy Clement, Cardiac Imaging Nurse Navigator ?[facility] ?Direct Office Dial: [phone]  ? ?For scheduling needs, including cancellations  and rescheduling, please call [place], [phone]. ? ?ZIO XT- Long Term Monitor Instructions ? ?Your physician has requested you wear a ZIO patch monitor for 14 days.  ?This is a single patch monitor. [employer] supplies one patch monitor per enrollment. Additional ?stickers are not available. Please do not  apply patch if you will be having a Nuclear Stress Test,  ?Echocardiogram, Cardiac CT, MRI, or Chest Xray during the period you would be wearing the  ?monitor. The patch cannot be worn during these tests. You cannot remove and re-apply the  ?ZIO XT patch monitor.  ?Your ZIO patch monitor will be mailed 3 day USPS to your address on file. It may take 3-5 days  ?to receive your monitor after you have been enrolled.  ?Once you have received your monitor, please review the enclosed instructions. Your monitor  ?has already been registered assigning a specific monitor serial # to you. ? ?Billing and Patient Assistance Program Information ? ?We have supplied [employer] with any of your insurance information on file for billing purposes. ?[employer] offers a sliding scale Patient Assistance Program for patients that do not have  ?insurance, or whose insurance does not completely cover the cost of the ZIO monitor.  ?You must apply for the Patient Assistance Program to qualify for this discounted rate.  ?To apply, please call [employer] at [phone], select option 4, select option 2, ask to apply for  ?Patient Assistance Program. Theodore Demark will ask your household income, and how many people  ?are in your household. They will quote your out-of-pocket cost based on that information.  ?[employer] will also be able to set up a 44-month interest-free payment plan if needed. ? ?Applying the monitor ?  ?Shave hair from upper left chest.  ?Hold abrader disc by orange tab. Rub abrader in 40 strokes over the upper left chest as  ?indicated in your monitor instructions.  ?Clean area with 4 enclosed alcohol pads. Let dry.  ?Apply patch as indicated in monitor instructions. Patch will be placed under collarbone on left  ?side of chest with arrow pointing upward.  ?Rub patch adhesive wings for 2 minutes. Remove white label marked "1". Remove the white  ?label marked "2". Rub patch adhesive wings for 2 additional minutes.  ?While looking in a mirror,  press and release button in center of patch. A small green light will  ?flash 3-4 times. This will be your only indicator that the monitor has been turned on.  ?Do not shower for the first 24 hours. You may shower after the first 24 hours.  ?Press the button if you feel a symptom. You will hear a small click. Record Date, Time and  ?Symptom in the Patient Logbook.  ?When you are ready to remove the patch, follow instructions on the last 2 pages of Patient  ?Logbook. Stick patch monitor onto the last page of Patient Logbook.  ?Place Patient Logbook in the blue and white box. Use locking tab on box and tape box closed  ?securely. The blue and white box has prepaid postage on it. Please place it in the mailbox as  ?soon as possible. Your physician should have your test results approximately 7 days after the  [facility]  ?Call [facility]at [phone] you have questions regarding  ?your ZIO XT patch monitor. Call them immediately if you see an orange light blinking on your  ?monitor.  ?If your monitor falls off in less than 4 days, contact  our Monitor department at [phone].  ?If your monitor becomes loose or falls off after 4 days call [employer] at [phone] for  ?suggestions on securing your monitor ? ? ? ?Follow-Up: ?At [facility], you and your health needs are our priority.  As part of our continuing mission to provide you with exceptional heart care, we have created designated Provider Care Teams.  These Care Teams include your primary Cardiologist (physician) and Advanced Practice Providers (APPs -  Physician Assistants and Nurse Practitioners) who all work together to provide you with the care you need, when you need it. ? ?We recommend signing up for the patient portal called "MyChart".  Sign up information is provided on this After Visit Summary.  MyChart is used to connect with patients for Virtual Visits (Telemedicine).  Patients are  able to view lab/test results, encounter notes, upcoming appointments, etc.  Non-urgent messages can be sent to your provider as well.   ?To learn more about what you can do with MyChart, go to https://

## 2021-06-20 NOTE — Progress Notes (Unsigned)
Enrolled for [employer] to mail a ZIO XT long term holter monitor to the patients address on file.  

## 2021-06-21 LAB — BASIC METABOLIC PANEL
BUN/Creatinine Ratio: 13 (ref 12–28)
BUN: 12 mg/dL (ref 8–27)
CO2: 27 mmol/L (ref 20–29)
Calcium: 10.1 mg/dL (ref 8.7–10.3)
Chloride: 101 mmol/L (ref 96–106)
Creatinine, Ser: 0.89 mg/dL (ref 0.57–1.00)
Glucose: 90 mg/dL (ref 70–99)
Potassium: 4.6 mmol/L (ref 3.5–5.2)
Sodium: 141 mmol/L (ref 134–144)
eGFR: 70 mL/min/{1.73_m2} (ref >59)

## 2021-06-21 LAB — MAGNESIUM: Magnesium: 2.2 mg/dL (ref 1.6–2.3)

## 2021-06-21 LAB — LIPID PANEL
Chol/HDL Ratio: 5 ratio — ABNORMAL HIGH (ref 0.0–4.4)
Cholesterol, Total: 256 mg/dL — ABNORMAL HIGH (ref 100–199)
HDL: 51 mg/dL (ref >39)
LDL Chol Calc (NIH): 175 mg/dL — ABNORMAL HIGH (ref 0–99)
Triglycerides: 165 mg/dL — ABNORMAL HIGH (ref 0–149)
VLDL Cholesterol Cal: 30 mg/dL (ref 5–40)

## 2021-06-21 LAB — LIPOPROTEIN A (LPA): Lipoprotein (a): 21.8 nmol/L (ref <75.0)

## 2021-06-22 ENCOUNTER — Encounter: Payer: Self-pay | Admitting: Cardiology

## 2021-06-22 MED ORDER — ROSUVASTATIN CALCIUM 10 MG PO TABS: 10.0000 mg | ORAL_TABLET | Freq: Every day | ORAL | 3 refills | Status: DC

## 2021-06-24 DIAGNOSIS — R002 Palpitations: Secondary | ICD-10-CM

## 2021-06-28 ENCOUNTER — Telehealth (HOSPITAL_COMMUNITY): Payer: Self-pay | Admitting: Emergency Medicine

## 2021-06-28 NOTE — Telephone Encounter (Signed)
Pt wearing zio and pushed CCTA appt back to 4/19 ?Pt was very appreciative ? ?Marchia Bond RN Navigator Cardiac Imaging ?[facility] ?[phone] Office  ?[phone] Cell ? ?

## 2021-06-30 ENCOUNTER — Ambulatory Visit (HOSPITAL_COMMUNITY): Payer: 59

## 2021-07-05 NOTE — Progress Notes (Signed)
?Radiation Oncology         [phone] ?________________________________ ? ?Name: Ashley Villegas MRN: [id number]  ?Date: 07/06/2021  DOB: [birth year] ? ?Follow-Up Visit Note ? ?CC: [name], MD  [name], MD ? ?  ICD-10-CM   ?1. Endometrial cancer ([place])  C54.1   ?  ? ? ?Diagnosis:  Recurrent endometrial cancer, original stage IA grade 1, s/p hysterectomy  with enhancing lesion centered along the right levator ani muscle, status post additional radiation therapy ? ?Interval Since Last Radiation: 2 years, 7 months, and 10 days  ? ?Radiation Treatment Dates: 11/10/2019 through 11/27/2019 ?Site Technique Total Dose (Gy) Dose per Fx (Gy) Completed Fx Beam Energies  ?Perineum: Pelvis IMRT 28/28 2 14/14 6X  ? ? ?Narrative:  The patient returns today for routine follow-up, she was last seen here for follow-up on 12/08/21. Since her last visit, the patient followed up with Dr. Berline Lopes on 02/24/22. During which time, the patient denied any symptoms concerning for disease recurrence and was noted as NED on examination. The patient did endorsed some ongoing SOB on exertion and wheezing since her COVID infection last year. The patient was also found to have a palpable firm area on her neck on examination.    ? ?Dr. Berline Lopes ordered a CXR for evaluation of her respiratory symptoms on 03/01/21 which showed no abnormalities. She also ordered a soft tissue head/neck ultrasound that same date which showed no evidence of cervical lymphadenopathy or other sonographic abnormality in the anterior bilateral neck.    ? ?The patient's shortness of breath continued to persist. She also developed chest pain and palpitations which prompted her to meet with her cardiologist, Dr. Harriet Masson, on 06/20/21. Given that the patient has intermediate risk factors for CAD, Dr. Harriet Masson ordered a coronary CTA and an echocardiogram for further evaluation. Coronary CTA is scheduled for 07/12/21.            ? ?She continues to have some dyspnea on  exertion and right-sided chest pain when this occurs.  She currently has a heart monitor on.  She occasionally will have some mild intermittent discomfort in the right lower pelvis area.  This is very mild and does not require pain medication.  She denies any vaginal bleeding or discharge.  She denies any abdominal bloating hematuria or recent rectal bleeding.          ? ?Allergies:  is allergic to oxycodone and hydrocodone. ? ?Meds: ?Current Outpatient Medications  ?Medication Sig Dispense Refill  ? acetaminophen (TYLENOL) 500 MG tablet Take 500 mg by mouth every 6 (six) hours as needed.    ? albuterol (VENTOLIN HFA) 108 (90 Base) MCG/ACT inhaler SMARTSIG:2 Puff(s) Via Inhaler 4 Times Daily PRN    ? Cholecalciferol (VITAMIN D3) 25 MCG (1000 UT) CAPS Take 1,000 Units by mouth daily.    ? FLUoxetine (PROZAC) 40 MG capsule Take 40 mg by mouth daily.    ? furosemide (LASIX) 40 MG tablet Take 40 mg by mouth 2 (two) times daily.    ? levothyroxine (SYNTHROID) 100 MCG tablet Take 100 mcg by mouth daily before breakfast.    ? rosuvastatin (CRESTOR) 10 MG tablet Take 1 tablet (10 mg total) by mouth daily. 90 tablet 3  ? ?No current facility-administered medications for this encounter.  ? ? ?Physical Findings: ?The patient is in no acute distress. Patient is alert and oriented. ? height is 5' (1.524 m) and weight is 181 lb 9.6 oz (82.4 kg). Her temperature is 97.6 ?  F (36.4 ?C). Her blood pressure is 135/69 and her pulse is 61. Her respiration is 20 and oxygen saturation is 98%. .  No significant changes. Lungs are clear to auscultation bilaterally. Heart has regular rate and rhythm. No palpable cervical, supraclavicular, or axillary adenopathy. Abdomen soft, non-tender, normal bowel sounds. ? ?On pelvic examination the external genitalia were unremarkable. A speculum exam was performed. There are no mucosal lesions noted in the vaginal vault. On bimanual and rectovaginal examination there were no pelvic masses appreciated.   She reports some discomfort in the right lower quadrant with pelvic examination.  Careful palpation along the distal right lateral vaginal wall elicits no pain.  Exam unremarkable in this area.  She does also report of been burning sensation with placement of the speculum, possibly some irritation noted with surgical lube. ? ? ? ?Lab Findings: ?Lab Results  ?Component Value Date  ? WBC 3.4 (L) 05/14/2020  ? HGB 14.0 05/14/2020  ? HCT 42.7 05/14/2020  ? MCV 93.6 05/14/2020  ? PLT 231 05/14/2020  ? ? ?Radiographic Findings: ?No results found. ? ?Impression:  Recurrent endometrial cancer, original stage IA grade 1, s/p hysterectomy  with enhancing lesion centered along the right levator ani muscle, status post additional radiation therapy ? ?No evidence of recurrence on clinical exam today.  She does have intermittent discomfort in the right lower pelvis area and offered to repeat MRI but given her multiple upcoming appointments for cardiac evaluation she would like to hold off on this issue. ? ?Plan: She will follow-up with Dr. Berline Lopes in June or July.  Routine follow-up in radiation oncology in 6 months. ? ? ?25 minutes of total time was spent for this patient encounter, including preparation, face-to-face counseling with the patient and coordination of care, physical exam, and documentation of the encounter. ?____________________________________ ? ?Blair Promise, PhD, MD ? ?This document serves as a record of services personally performed by Gery Pray, MD. It was created on his behalf by Roney Mans, a trained medical scribe. The creation of this record is based on the scribe's personal observations and the provider's statements to them. This document has been checked and approved by the attending provider. ? ?

## 2021-07-06 ENCOUNTER — Encounter: Payer: Self-pay | Admitting: Radiation Oncology

## 2021-07-06 ENCOUNTER — Telehealth: Payer: Self-pay | Admitting: *Deleted

## 2021-07-06 ENCOUNTER — Other Ambulatory Visit: Payer: Self-pay

## 2021-07-06 ENCOUNTER — Ambulatory Visit
Admission: RE | Admit: 2021-07-06 | Discharge: 2021-07-06 | Disposition: A | Discharge status: Home / Self Care | Payer: Medicare Other | Source: Ambulatory Visit | Attending: Radiation Oncology | Admitting: Radiation Oncology

## 2021-07-06 DIAGNOSIS — R0602 Shortness of breath: Secondary | ICD-10-CM | POA: Diagnosis not present

## 2021-07-06 DIAGNOSIS — Z79899 Other long term (current) drug therapy: Secondary | ICD-10-CM | POA: Diagnosis not present

## 2021-07-06 DIAGNOSIS — Z923 Personal history of irradiation: Secondary | ICD-10-CM | POA: Diagnosis not present

## 2021-07-06 DIAGNOSIS — Z8542 Personal history of malignant neoplasm of other parts of uterus: Secondary | ICD-10-CM | POA: Diagnosis not present

## 2021-07-06 DIAGNOSIS — C541 Malignant neoplasm of endometrium: Secondary | ICD-10-CM | POA: Diagnosis not present

## 2021-07-06 DIAGNOSIS — R0609 Other forms of dyspnea: Secondary | ICD-10-CM | POA: Insufficient documentation

## 2021-07-06 NOTE — Telephone Encounter (Signed)
CALLED PATIENT TO INFORM OF FU APPT. WITH DR. Berline Villegas BEING MOVED TO 10-09-21- ARRIVAL TIME- 12:45 PM, LVM FOR A RETURN CALL ?

## 2021-07-06 NOTE — Progress Notes (Signed)
Ashley Villegas is here today for follow up post radiation to the pelvic. ? ?They completed their radiation on: 11/27/19 ? ?Does the patient complain of any of the following: ? ?Pain: Patient reports having intermittent pain to pelvic area since completing treatment.  ?Abdominal bloating: No ?Diarrhea/Constipation: Diarrhea at times.  ?Nausea/Vomiting: No ?Vaginal Discharge: No ?Blood in Urine or Stool: No ?Urinary Issues (dysuria/incomplete emptying/ incontinence/ increased frequency/urgency): No ?Does patient report using vaginal dilator 2-3 times a week and/or sexually active 2-3 weeks: No, patient not using dilator or being sexually active.  ?Post radiation skin changes: No ? ? ?Additional comments if applicable: Patient reports having an external heart monitor.  ?  ?Vitals:  ? 07/06/21 0848  ?BP: 135/69  ?Pulse: 61  ?Resp: 20  ?Temp: 97.6 ?F (36.4 ?C)  ?SpO2: 98%  ?Weight: 181 lb 9.6 oz (82.4 kg)  ?Height: 5' (1.524 m)  ?  ?

## 2021-07-07 ENCOUNTER — Other Ambulatory Visit (HOSPITAL_COMMUNITY): Payer: 59

## 2021-07-11 ENCOUNTER — Telehealth (HOSPITAL_COMMUNITY): Payer: Self-pay | Admitting: Emergency Medicine

## 2021-07-11 NOTE — Telephone Encounter (Signed)
Reaching out to patient to offer assistance regarding upcoming cardiac imaging study; pt verbalizes understanding of appt date/time, parking situation and where to check in, pre-test NPO status and medications ordered, and verified current allergies; name and call back number provided for further questions should they arise ?Marchia Bond RN Navigator Cardiac Imaging ?[facility] ?[phone] office ?[phone] cell ? ?Daily meds, holding lasix ?Denies iv issues ?Arrival 245 ?

## 2021-07-12 ENCOUNTER — Ambulatory Visit (HOSPITAL_COMMUNITY)
Admission: RE | Admit: 2021-07-12 | Discharge: 2021-07-12 | Disposition: A | Discharge status: Home / Self Care | Payer: Medicare Other | Source: Ambulatory Visit | Attending: Cardiology | Admitting: Cardiology

## 2021-07-12 DIAGNOSIS — R072 Precordial pain: Secondary | ICD-10-CM | POA: Diagnosis not present

## 2021-07-12 MED ORDER — NITROGLYCERIN 0.4 MG SL SUBL
SUBLINGUAL_TABLET | SUBLINGUAL | Status: AC
  Filled 2021-07-12: qty 2

## 2021-07-12 MED ORDER — IOHEXOL 350 MG/ML SOLN
100.0000 mL | Freq: Once | INTRAVENOUS | Status: AC | PRN
  Administered 2021-07-12: 100 mL via INTRAVENOUS

## 2021-07-12 MED ORDER — NITROGLYCERIN 0.4 MG SL SUBL
0.8000 mg | SUBLINGUAL_TABLET | Freq: Once | SUBLINGUAL | Status: AC
  Administered 2021-07-12: 0.8 mg via SUBLINGUAL

## 2021-07-13 ENCOUNTER — Ambulatory Visit (HOSPITAL_COMMUNITY): Payer: Medicare Other | Attending: Cardiology

## 2021-07-13 DIAGNOSIS — R079 Chest pain, unspecified: Secondary | ICD-10-CM

## 2021-07-13 DIAGNOSIS — R0609 Other forms of dyspnea: Secondary | ICD-10-CM | POA: Diagnosis not present

## 2021-07-13 LAB — ECHOCARDIOGRAM COMPLETE
Area-P 1/2: 3.4 cm2
S' Lateral: 2.7 cm

## 2021-07-14 DIAGNOSIS — R002 Palpitations: Secondary | ICD-10-CM | POA: Diagnosis not present

## 2021-07-14 DIAGNOSIS — Z6835 Body mass index (BMI) 35.0-35.9, adult: Secondary | ICD-10-CM | POA: Diagnosis not present

## 2021-07-14 DIAGNOSIS — R0609 Other forms of dyspnea: Secondary | ICD-10-CM | POA: Diagnosis not present

## 2021-07-24 ENCOUNTER — Other Ambulatory Visit: Payer: Self-pay | Admitting: Internal Medicine

## 2021-07-24 DIAGNOSIS — R0602 Shortness of breath: Secondary | ICD-10-CM

## 2021-07-25 ENCOUNTER — Ambulatory Visit: Payer: Medicare Other | Admitting: Internal Medicine

## 2021-07-25 DIAGNOSIS — R0602 Shortness of breath: Secondary | ICD-10-CM

## 2021-07-25 LAB — PULMONARY FUNCTION TEST
DL/VA % pred: 102 %
DL/VA: 4.36 ml/min/mmHg/L
DLCO cor % pred: 95 %
DLCO cor: 16.14 ml/min/mmHg
DLCO unc % pred: 95 %
DLCO unc: 16.14 ml/min/mmHg
FEF 25-75 Post: 2.15 L/sec
FEF 25-75 Pre: 1.76 L/sec
FEF2575-%Change-Post: 22 %
FEF2575-%Pred-Post: 128 %
FEF2575-%Pred-Pre: 105 %
FEV1-%Change-Post: 6 %
FEV1-%Pred-Post: 93 %
FEV1-%Pred-Pre: 87 %
FEV1-Post: 1.77 L
FEV1-Pre: 1.66 L
FEV1FVC-%Change-Post: 2 %
FEV1FVC-%Pred-Pre: 108 %
FEV6-%Change-Post: 4 %
FEV6-%Pred-Post: 87 %
FEV6-%Pred-Pre: 84 %
FEV6-Post: 2.11 L
FEV6-Pre: 2.03 L
FEV6FVC-%Pred-Post: 104 %
FEV6FVC-%Pred-Pre: 104 %
FVC-%Change-Post: 4 %
FVC-%Pred-Post: 83 %
FVC-%Pred-Pre: 80 %
FVC-Post: 2.11 L
FVC-Pre: 2.03 L
Post FEV1/FVC ratio: 84 %
Post FEV6/FVC ratio: 100 %
Pre FEV1/FVC ratio: 82 %
Pre FEV6/FVC Ratio: 100 %
RV % pred: 111 %
RV: 2.22 L
TLC % pred: 98 %
TLC: 4.38 L

## 2021-07-25 NOTE — Progress Notes (Signed)
PFT done today. 

## 2021-07-28 DIAGNOSIS — Z6835 Body mass index (BMI) 35.0-35.9, adult: Secondary | ICD-10-CM | POA: Diagnosis not present

## 2021-07-28 DIAGNOSIS — E039 Hypothyroidism, unspecified: Secondary | ICD-10-CM | POA: Diagnosis not present

## 2021-07-28 DIAGNOSIS — E7800 Pure hypercholesterolemia, unspecified: Secondary | ICD-10-CM | POA: Diagnosis not present

## 2021-08-29 ENCOUNTER — Telehealth: Payer: Self-pay | Admitting: *Deleted

## 2021-08-29 NOTE — Telephone Encounter (Signed)
Per Dr Berline Lopes moved the patient's appt from 7/17 to 7/18

## 2021-09-04 ENCOUNTER — Ambulatory Visit: Payer: 59 | Admitting: Gynecologic Oncology

## 2021-10-06 ENCOUNTER — Encounter: Payer: Self-pay | Admitting: Gynecologic Oncology

## 2021-10-09 ENCOUNTER — Ambulatory Visit: Payer: 59 | Admitting: Gynecologic Oncology

## 2021-10-10 ENCOUNTER — Inpatient Hospital Stay: Payer: Medicare Other | Attending: Gynecologic Oncology | Admitting: Gynecologic Oncology

## 2021-10-10 ENCOUNTER — Telehealth: Payer: Self-pay

## 2021-10-10 ENCOUNTER — Other Ambulatory Visit: Payer: Self-pay

## 2021-10-10 ENCOUNTER — Encounter: Payer: Self-pay | Admitting: Gynecologic Oncology

## 2021-10-10 ENCOUNTER — Inpatient Hospital Stay: Payer: Medicare Other

## 2021-10-10 VITALS — BP 127/70 | HR 79 | Temp 97.8°F | Resp 16 | Ht 60.0 in | Wt 178.0 lb

## 2021-10-10 DIAGNOSIS — Z8542 Personal history of malignant neoplasm of other parts of uterus: Secondary | ICD-10-CM | POA: Insufficient documentation

## 2021-10-10 DIAGNOSIS — C541 Malignant neoplasm of endometrium: Secondary | ICD-10-CM

## 2021-10-10 DIAGNOSIS — Z9221 Personal history of antineoplastic chemotherapy: Secondary | ICD-10-CM | POA: Insufficient documentation

## 2021-10-10 DIAGNOSIS — Z90710 Acquired absence of both cervix and uterus: Secondary | ICD-10-CM | POA: Insufficient documentation

## 2021-10-10 DIAGNOSIS — Z90722 Acquired absence of ovaries, bilateral: Secondary | ICD-10-CM | POA: Insufficient documentation

## 2021-10-10 DIAGNOSIS — R102 Pelvic and perineal pain: Secondary | ICD-10-CM | POA: Diagnosis not present

## 2021-10-10 DIAGNOSIS — Z923 Personal history of irradiation: Secondary | ICD-10-CM | POA: Insufficient documentation

## 2021-10-10 LAB — BASIC METABOLIC PANEL - CANCER CENTER ONLY
Anion gap: 9 (ref 5–15)
BUN: 16 mg/dL (ref 8–23)
CO2: 28 mmol/L (ref 22–32)
Calcium: 10.4 mg/dL — ABNORMAL HIGH (ref 8.9–10.3)
Chloride: 102 mmol/L (ref 98–111)
Creatinine: 0.92 mg/dL (ref 0.44–1.00)
GFR, Estimated: >60 mL/min
Glucose, Bld: 89 mg/dL (ref 70–99)
Potassium: 4.1 mmol/L (ref 3.5–5.1)
Sodium: 139 mmol/L (ref 135–145)

## 2021-10-10 NOTE — Progress Notes (Signed)
Gynecologic Oncology Return Clinic Visit  10/10/2021  Reason for Visit: Surveillance visit in the setting of recurrent uterine cancer  Treatment History: Oncology History Overview Note  Abnormal MSI, germline mutation was negative Endometrioid   Endometrial cancer ([place])  09/15/2014 Pathology Results   Endometrium, curettage - ENDOMETRIAL ADENOCARCINOMA. - SEE COMMENT. Microscopic Comment Although definitive characterization is best performed on resection specimen, as sampled, the endometrial adenocarcinoma appears to be endometrioid subtype, FIGO grade 1.    09/15/2014 Surgery   PreOp: postmenopausal bleeding, cervical stenosis PostOp: same and uterine polyp Procedure:  Hysteroscopy, Dilation and Curettage, Myosure polypectomy Surgeon: Dr. Janyth Pupa   Findings:8cm uterus with thickened polypoid-like endometrium, large 2cm irregular appearing polyp   Specimens: 1) endometrial curettings   10/26/2014 Pathology Results   1. Uterus +/- tubes/ovaries, neoplastic - INVASIVE ADENOCARCINOMA, ENDOMETRIOID TYPE, SEE COMMENT. - TUMOR INVOLVES LESS THAN ONE HALF MYOMETRIAL THICKNESS. - TUMOR INVOLVES UTERINE ADENOMYOSIS. - BENIGN LEIOMYOMATA (UP TO 2.5 CM). - BENIGN CERVICAL MUCOSA; NEGATIVE FOR INTRAEPITHELIAL LESION OR MALIGNANCY. - BENIGN RIGHT AND LEFT OVARIES; NEGATIVE FOR ATYPIA OR MALIGNANCY. - BENIGN RIGHT AND LEFT FALLOPIAN TUBE; NEGATIVE FOR ATYPIA OR MALIGNANCY. - BENIGN PARATUBAL CYSTS; NEGATIVE FOR ATYPIA OR MALIGNANCY. - SEE TUMOR SYNOPTIC TEMPLATE BELOW. 2. Lymph nodes, regional resection, right pelvic - FOUR LYMPH NODES, NEGATIVE FOR TUMOR (0/4). 3. Lymph nodes, regional resection, left pelvic - FOUR LYMPH NODES, NEGATIVE FOR TUMOR (0/4). Microscopic Comment 1. ONCOLOGY TABLE-UTERUS, CARCINOMA OR CARCINOSARCOMA Specimen: Uterus and bilateral fallopian tubes and ovaries. Procedure: Hysterectomy and bilateral salpingo-oophorectomy. Lymph node sampling performed:  Yes. Specimen integrity: Intact. Maximum tumor size: 4.0 cm (tumor involved entire endometrium) Histologic type: Adenocarcinoma, endometrioid type. Grade: 1 Myometrial invasion: 0.5 cm where myometrium is 1.5 cm in thickness Cervical stromal involvement: Absent. Extent of involvement of other organs: None Lymph - vascular invasion: Absent. Peritoneal washings: N/A Lymph nodes: # examined - 8 ; # positive - 0 Pelvic lymph nodes: N/A involved of N/A lymph nodes. Para-aortic lymph nodes: N/A involved of N/A lymph nodes. Other (specify involvement and site): N/A TNM code: pT1a, pN0 FIGO Stage (based on pathologic findings, needs clinical correlation): N/A Comments: None MSI testing abnormal   10/26/2014 Surgery   Surgery: Total robotic hysterectomy, bilateral salpingo-oophorectomy, bilateral pelvic lymph node dissection.    Surgeons:  Lucita Lora. Alycia Rossetti, MD; Lahoma Crocker, MD      Pathology: Uterus, cervix, bilateral tubes and ovaries, bilateral pelvic lymph nodes to pathology.    Operative findings: Omentum adherent to midline vertical incision. Fibroid uterus. Normal adnexa. Adhesions of anterior bladder to uterus. Frozen section with grade 1 cancer focally involving adenomyosis, minimal myometrial invasion.   11/17/2014 Imaging   Ct abdomen 1. Status post hysterectomy. Pelvic edema which is greater than typically seen 3 weeks postop. Suspicious for postoperative infection. 2. Bladder wall thickening and irregularity. Although this could be partially due to underdistention, Concurrent cystitis cannot be excluded. 3. Bilateral fluid density lesions along the pelvic sidewalls. Favored to represent seromas or lymphangiomas. No specific features to suggest abscess. If the patient's symptoms persist after appropriate antibiotic therapy, aspiration of the largest left pelvic sidewall "Lesion" should be considered. 4. Suspicion of mild hepatic steatosis. Indeterminate right hepatic lobe  lesion. If definitive characterization is desired in this patient with history of primary malignancy, nonemergent pre and post contrast abdominal MRI could be performed. 5. Hiatal hernia.   11/25/2014 Imaging   1. Persistent bilateral pelvic sidewall fluid collections, left greater than right, with left-sided inflammatory changes.  2. Technically successful 12 French left pelvic abscess drain catheter placement. A sample of the aspirate was sent for Gram stain, culture and sensitivity.   12/08/2014 Imaging   CT abdomen and pelvis 1. Complete resolution of dominant fluid collection within the left hemipelvis following percutaneous drainage catheter placement. This percutaneous drainage catheter was subsequent removed intact at the patient's bedside. 2. Continued decrease in size of bilobed fluid collections within the right hemipelvis with superficial component measuring 1.6 cm and posterior component measuring 2.6 cm, indeterminate though both favored to represent evolving seromas. 3. Unchanged indeterminate approximately 1.2 cm hepatic lesion. Further evaluation with contrast-enhanced abdominal MRI could be performed as clinically indicated. 4. Colonic diverticulosis without evidence of diverticulitis.   10/19/2016 Imaging   New 2.6 cm soft tissue mass involving right vaginal cuff, consistent with locally recurrent carcinoma.   No other sites of metastatic disease identified.   Colonic diverticulosis, without radiographic evidence of diverticulitis.   Mild hepatic steatosis.     10/24/2016 Relapse/Recurrence   Vaginal cuff recurrence. No evidence of distant disease.   10/24/2016 Pathology Results   Vagina, biopsy, right apex - ADENOCARCINOMA - SEE COMMENT Microscopic Comment The morphologic features are consistent with the patient's previously diagnosed endometrioid adenocarcinoma   11/08/2016 - 12/27/2016 Radiation Therapy   45 Gy in 25 fractions of 1.8 Gy. The residual pelvic mass was  boosted to 14 Gy in 7 fractions of 2 Gy. No intracavitary lesion seen at completion of pelvic fields to boost with brachytherapy.   06/12/2017 Imaging   CT abdomen and pelvis 1. New 1.1 by 0.8 cm right lower lobe pulmonary nodule, concerning for pulmonary metastatic disease. Possibilities for further assessment include biopsy or nuclear medicine PET-CT to assess this lesion and the rest of the neck/chest/abdomen/pelvis for other potential hypermetabolic lesions. 2. The previous soft tissue mass along the right vaginal cuff is no longer present. 3. Stable and likely benign right hepatic lobe hypodense lesion. 4. Other imaging findings of potential clinical significance: Sigmoid colon diverticulosis. Aortic Atherosclerosis (ICD10-I70.0). Multilevel lumbar degenerative disc disease. Mild cardiomegaly. Small type 1 hiatal hernia.   07/05/2017 Imaging   Ct chest Several bilateral pulmonary nodules measuring up to 11 mm, highly suspicious for pulmonary metastases.    No evidence of lymphadenopathy or pleural effusion.   Aortic Atherosclerosis (ICD10-I70.0).   07/25/2017 Imaging   Status post CT-guided lung nodule biopsy. Tissue specimen sent to pathology for complete histopathologic analysis.   07/25/2017 Pathology Results   Lung, needle/core biopsy(ies), RLL - METASTATIC ADENOCARCINOMA - SEE COMMENT Microscopic Comment By immunohistochemistry, the neoplastic cells are positive for Pax-8 and ER but negative for TTF-1. Overall, this immunoprofile is consistent with metastasis from the patient's known endometrioid adenocarcinoma   07/30/2017 Cancer Staging   Staging form: Corpus Uteri - Carcinoma and Carcinosarcoma, AJCC 8th Edition - Clinical: Stage IVB (rcT1, cN0, pM1) - Signed by Heath Lark, MD on 07/30/2017   08/09/2017 - 11/21/2017 Chemotherapy   The patient had pembrolizumab for chemotherapy treatment.    10/10/2017 Imaging   1. Interval decrease in size and contour. The of previously described  right and left lower lobe pulmonary nodules. 2. Interval increase in size of mediastinal and hilar adenopathy, potentially metastatic in etiology. 3. Aortic Atherosclerosis (ICD10-I70.0).   11/08/2017 - 11/10/2017 Hospital Admission   She was admitted to the hospital due to uncontrolled pain   11/29/2017 Imaging   Chest Impression:  No thoracic metastasis.  No discrete pulmonary nodules identified.  Abdomen / Pelvis Impression:  No evidence of local recurrence or metastatic endometrial carcinoma in the abdomen pelvis.   02/21/2018 Imaging   1. No findings to suggest metastatic disease in the chest, abdomen or pelvis. 2. Mild colonic diverticulosis without evidence of acute diverticulitis at this time. 3. Aortic atherosclerosis. 4. Additional incidental findings, as above.   03/10/2018 Imaging   Bone scan: No scintigraphic evidence of osseous metastatic disease.   05/22/2018 Imaging   1. Stable exam. No evidence of recurrent or metastatic carcinoma within the chest, abdomen, or pelvis. 2. Colonic diverticulosis, without radiographic evidence of diverticulitis.   11/13/2018 Imaging   1. No evidence of recurrent or metastatic carcinoma within the chest, abdomen, or pelvis. 2. Colonic diverticulosis, without radiographic evidence of diverticulitis. 3. Stable hepatic steatosis.   Aortic Atherosclerosis (ICD10-I70.0).   05/22/2019 Imaging   1. No evidence of recurrent or metastatic disease in the chest, abdomen, or pelvis status post hysterectomy. 2. Previously seen metastatic pulmonary nodules in the bilateral lower lobes remain resolved. 3. Unchanged irregular opacities of the peripheral right upper lobe, likely chronic sequelae of prior infection or inflammation. 4. Hepatic steatosis. 5. Sigmoid diverticulosis. 6. Aortic Atherosclerosis (ICD10-I70.0).     10/18/2019 Imaging   1. Heterogeneously enhancing lesion measuring 2.4 x 1.9 x 1.9 cm immediately to the right of the urethra  along the anterolateral aspect of the vaginal wall, likely centered in the right levator ani 9 musculature. This has a malignant appearance, but whether this represents a new primary lesion or a metastatic lesion is uncertain on today's examination. 2. Mild colonic diverticulosis.     11/02/2019 Imaging   CT chest 1. No evidence of metastatic disease. 2. Hepatic steatosis. 3.  Aortic atherosclerosis (ICD10-I70.0).   11/10/2019 - 11/27/2019 Radiation Therapy   Radiation Treatment Dates: 11/10/2019 through 11/27/2019 Site Technique Total Dose (Gy) Dose per Fx (Gy) Completed Fx Beam Energies  Perineum: Pelvis IMRT 28/28 2 14/14 6X      01/21/2020 Imaging   1. Complete resolution of the heterogeneously enhancing lesion seen along the right vaginal introitus on the prior study. No residual measurable lesion at this location today. No new or progressive findings in the pelvis/pelvic floor on today's exam. 2. Left colonic diverticulosis without features of diverticulitis.   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    Metastasis to lung ([place])  07/30/2017 Initial Diagnosis   Metastasis to lung ([place])   08/09/2017 - 11/01/2017 Chemotherapy   The patient had pembrolizumab (KEYTRUDA) 200 mg in sodium chloride 0.9 % 50 mL chemo infusion, 200 mg, Intravenous, Once, 5 of 7 cycles Administration: 200 mg (08/09/2017), 200 mg (08/30/2017), 200 mg (09/20/2017), 200 mg (10/11/2017), 200 mg (11/01/2017)  for chemotherapy treatment.     Interval History: Patient reports overall doing well.  Denies any abdominal pain but does have occasional  right-sided pelvic pain with bowel movements.  Notes that this happens about once a week.  No more frequently than it had been at her last follow-up visit.  She denies any urinary symptoms.  She denies any vaginal bleeding or discharge.  Fatigue has improved.  She is doing water aerobics which has helped with her strength.  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, recurrent    Anxiety    Arthritis    Carpal tunnel syndrome, bilateral upper limbs    Complication of anesthesia    Depression    Family history of uterine cancer    GERD (gastroesophageal reflux disease)    History of radiation therapy 11/08/2016-12/27/2016   pelvis 45 Gy in 25 fractions, pelvis boost 14 gy in 7 fractions   History of radiation therapy    endometrial - pelvis IMRT 11/10/2019-11/27/2019   Hypothyroidism    on synthroid    PONV (postoperative nausea and vomiting)     Past Surgical History:  Procedure Laterality Date   CARPAL TUNNEL RELEASE Bilateral 2020   CESAREAN SECTION     COLONOSCOPY     DIAGNOSTIC LAPAROSCOPY     fibroid tumors on her ovaries    DILATION AND CURETTAGE OF UTERUS     HAND SURGERY Right 2019   carpal tunnel   HYSTEROSCOPY WITH D & C N/A 09/15/2014   Procedure: DILATATION AND CURETTAGE /HYSTEROSCOPY/MYOSURE RESECTION OF POLYP;  Surgeon: Janyth Pupa, DO;  Location: [facility];  Service: Gynecology;  Laterality: N/A;   KNEE SURGERY     LYMPH NODE DISSECTION N/A 10/26/2014   Procedure: POSS LYMPHADENECTOMY ;  Surgeon: [name], MD;  Location: [facility];  Service: Gynecology;  Laterality: N/A;   ROBOTIC ASSISTED TOTAL HYSTERECTOMY WITH BILATERAL SALPINGO OOPHERECTOMY Bilateral 10/26/2014   Procedure: ROBOTIC ASSISTED TOTAL HYSTERECTOMY WITH BILATERAL SALPINGO OOPHORECTOMY;  Surgeon: [name], MD;  Location: [facility];  Service: Gynecology;  Laterality: Bilateral;   TOTAL KNEE ARTHROPLASTY Left 04/15/2019    Procedure: LEFT TOTAL KNEE ARTHROPLASTY;  Surgeon: Melrose Nakayama, MD;  Location: [facility];  Service: Orthopedics;  Laterality: Left;   TUBAL LIGATION     WISDOM TOOTH EXTRACTION      Family History  Problem Relation Age of Onset   Breast cancer Maternal Aunt        dx late 50's/60's, died in 59's   Uterine cancer Mother 52   Heart failure Mother    Dementia Father    Heart attack Paternal Grandfather 6   Other Sister 1       had abnromal finding on mammogram, unsure if cancer. Was told to 'remove part of of body'    Social History   Socioeconomic History   Marital status: Single    Spouse name: Not on file   Number of children: 2   Years of education: Not on file   Highest education level: Not on file  Occupational History   Occupation: customer service  Tobacco Use   Smoking status: Former    Packs/day: 0.50    Years: 13.00    Total pack years: 6.50    Types: Cigarettes    Quit date: 10/20/2007    Years since quitting: 13.9   Smokeless tobacco: Never  Vaping Use   Vaping Use: Never used  Substance and Sexual Activity   Alcohol use: Yes    Alcohol/week: 1.0 standard drink of alcohol    Types: 1 Cans of beer per week    Comment: every 2 to 3 months   Drug use: No   Sexual activity: Not Currently  Other Topics Concern   Not on file  Social History Narrative   Not on file   Social Determinants of Health   Financial Resource Strain: Not on file  Food Insecurity: Not on file  Transportation Needs: Not on file  Physical Activity: Not on file  Stress: Not on file  Social Connections: Not on file    Current Medications:  Current Outpatient Medications:    acetaminophen (TYLENOL) 500 MG tablet, Take 500 mg by mouth every 6 (six) hours as needed., Disp: , Rfl:    albuterol (VENTOLIN HFA) 108 (90 Base) MCG/ACT inhaler, SMARTSIG:2 Puff(s) Via Inhaler 4 Times Daily PRN, Disp: , Rfl:    Cholecalciferol (VITAMIN D3) 25 MCG (1000 UT) CAPS, Take 1,000 Units by  mouth daily., Disp: , Rfl:    FLUoxetine (PROZAC) 40 MG capsule, Take 40 mg by mouth daily., Disp: , Rfl:    furosemide (LASIX) 40 MG tablet, Take 40 mg by mouth 2 (two) times daily., Disp: , Rfl:    levothyroxine (SYNTHROID) 100 MCG tablet, Take 100 mcg by mouth daily before breakfast., Disp: , Rfl:    rosuvastatin (CRESTOR) 10 MG tablet, Take 1 tablet (10 mg total) by mouth daily., Disp: 90 tablet, Rfl: 3  Review of Systems: + fatigue, shortness of breath, anxiety, depression Denies appetite changes, fevers, chills, unexplained weight changes. Denies hearing loss, neck lumps or masses, mouth sores, ringing in ears or voice changes. Denies cough or wheezing.   Denies chest pain or palpitations. Denies leg swelling. Denies abdominal distention, pain, blood in stools, constipation, diarrhea, nausea, vomiting, or early satiety. Denies pain with intercourse, dysuria, frequency, hematuria or incontinence. Denies hot flashes, pelvic pain, vaginal bleeding or vaginal discharge.   Denies joint pain, back pain or muscle pain/cramps. Denies itching, rash, or wounds. Denies dizziness, headaches, numbness or seizures. Denies swollen lymph nodes or glands, denies easy bruising or bleeding. Denies confusion, or decreased concentration.  Physical Exam: BP 127/70 (BP Location: Right Arm, Patient Position: Sitting)   Pulse 79   Temp 97.8 F (36.6 C) (Tympanic)   Resp 16   Ht 5' (1.524 m)   Wt 178 lb (80.7 kg)   SpO2 98%   BMI 34.76 kg/m  General: Alert, oriented, no acute distress. HEENT: Normocephalic, atraumatic, sclera anicteric. Chest: Clear to auscultation bilaterally.   Abdomen: Obese, soft, nontender.  Normoactive bowel sounds.  No masses or hepatosplenomegaly appreciated.  Well-healed incisions. Extremities: Grossly normal range of motion.  Warm, well perfused.  No edema bilaterally. Skin: No rashes or lesions noted. Lymphatics: No cervical, supraclavicular, or inguinal adenopathy. GU:  Normal appearing external genitalia without erythema, excoriation, or lesions.  Speculum exam reveals atrophic vaginal mucosa with radiation changes present.  No masses or atypical vascularity noted.  Bimanual exam reveals mild fullness in the cul-de-sac which on rectovaginal exam I think may represent redundant colon.  Laboratory & Radiologic Studies: None new  Assessment & Plan: Chauntae Hults is a 71 y.o. woman with recurrent uterine cancer, most recently treated for para-vaginal lesion in 2021 with RT with complete disease resolution, presenting for surveillance visit.   The patient is overall doing very well.  Discussed with her exam findings in the setting of her intermittent pain with bowel movements.  I have recommended CT scan.  This was ordered and scheduled today.  I will call her back once the scan has been performed.   She continues to be more active, doing water aerobics.  Has lost approximately 4 pounds since March.   We reviewed again plan for surveillance including visits every 3 months, alternating between radiation oncology and our clinic.  We also reviewed signs and symptoms that would be concerning  for disease recurrence and the patient knows to call the clinic if she develops any of these before her next scheduled visit.  22 minutes of total time was spent for this patient encounter, including preparation, face-to-face counseling with the patient and coordination of care, and documentation of the encounter.  Jeral Pinch, MD  Division of Gynecologic Oncology  Department of Obstetrics and Gynecology  [facility]

## 2021-10-10 NOTE — Telephone Encounter (Signed)
Spoke with patient and reviewed BMP results. Per NP overall results are stable. Calcium (10.4) is slightly over normal range, would be something to follow. Patient verbalized understanding and states she eats two cups of yogurt daily and will reduce this to one a day. Instructed to call with any needs.

## 2021-10-10 NOTE — Telephone Encounter (Signed)
Attempted to contact patient to review BMP results. Unable to reach patient, left message requesting return call.

## 2021-10-10 NOTE — Patient Instructions (Addendum)
It was good to see you today.  I will call you when I get your CT results back.  We will continue visits every 3 months.  You see Dr. Sondra Come in October.  Please call my office sometime in November or December to schedule a visit to see me in January.  As always, if you develop new and concerning symptoms before your next visit, please call to see me sooner.

## 2021-10-11 ENCOUNTER — Telehealth: Payer: Self-pay

## 2021-10-11 NOTE — Telephone Encounter (Signed)
Pt called this morning stating she had been thinking all night about what Dr.Tucker mentioned yesterday after the rectal exam . Per pt, Dr.Tucker said she felt a "flap" and that is why she ordered a CT scan. Pt wanted more clarification. Pt states she did Google (she knows she shouldn't) and it stated increase calcium level, which labs from yesterday showed she has, could be d/t cancer. She is just concerned.   Advised pt, per Tuckers note, CT scan was ordered d/t patient having pain on right side during bowel movements and upon bimanual exam felt fullness in the cul-de-sac. I reassured pt the CT results will give more answers than what we have right now, it could be bowel back up in colon. She did say she doesn't have regular BM's.  Pt was feeling better about things and not so anxious as I was talking to her. She was thankful for taking the time to talk to her and explain things more. Pt aware I will make Dr.Tucker aware of call.   CT scan is 7/27 @ 2:00pm. She is aware as soon as we get the results, she will hear from Dr.Tucker.

## 2021-10-12 ENCOUNTER — Encounter: Payer: Self-pay | Admitting: Gynecologic Oncology

## 2021-10-13 ENCOUNTER — Ambulatory Visit (INDEPENDENT_AMBULATORY_CARE_PROVIDER_SITE_OTHER): Payer: Medicare Other | Admitting: Cardiology

## 2021-10-13 ENCOUNTER — Encounter: Payer: Self-pay | Admitting: Cardiology

## 2021-10-13 VITALS — BP 128/76 | HR 68 | Ht 60.0 in | Wt 179.4 lb

## 2021-10-13 DIAGNOSIS — E785 Hyperlipidemia, unspecified: Secondary | ICD-10-CM

## 2021-10-13 DIAGNOSIS — E559 Vitamin D deficiency, unspecified: Secondary | ICD-10-CM | POA: Diagnosis not present

## 2021-10-13 DIAGNOSIS — R5383 Other fatigue: Secondary | ICD-10-CM | POA: Diagnosis not present

## 2021-10-13 DIAGNOSIS — I471 Supraventricular tachycardia: Secondary | ICD-10-CM

## 2021-10-13 DIAGNOSIS — R400 Somnolence: Secondary | ICD-10-CM

## 2021-10-13 MED ORDER — METOPROLOL SUCCINATE ER 25 MG PO TB24: 12.5000 mg | ORAL_TABLET | Freq: Every day | ORAL | 3 refills | Status: DC

## 2021-10-13 NOTE — Patient Instructions (Addendum)
Medication Instructions:  Your physician has recommended you make the following change in your medication:  START: Metoprolol XL 12.'5mg'$  Daily. The tablet will be a '25mg'$  tablet but only take 0.5 of the tablet)  *If you need a refill on your cardiac medications before your next appointment, please call your pharmacy*   Lab Work: Vitamin D , Vitamin B-12, CBC & Lipid Panel  If you have labs (blood work) drawn today and your tests are completely normal, you will receive your results only by: [place] (if you have MyChart) OR A paper copy in the mail If you have any lab test that is abnormal or we need to change your treatment, we will call you to review the results.   Testing/Procedures: NONE   Follow-Up: At [facility], you and your health needs are our priority.  As part of our continuing mission to provide you with exceptional heart care, we have created designated Provider Care Teams.  These Care Teams include your primary Cardiologist (physician) and Advanced Practice Providers (APPs -  Physician Assistants and Nurse Practitioners) who all work together to provide you with the care you need, when you need it.  We recommend signing up for the patient portal called "MyChart".  Sign up information is provided on this After Visit Summary.  MyChart is used to connect with patients for Virtual Visits (Telemedicine).  Patients are able to view lab/test results, encounter notes, upcoming appointments, etc.  Non-urgent messages can be sent to your provider as well.   To learn more about what you can do with MyChart, go to [email or web address].    Your next appointment:   6 month(s)  The format for your next appointment:   In Person  Provider:   Berniece Salines, DO

## 2021-10-13 NOTE — Progress Notes (Signed)
Cardiology Office Note:    Date:  10/13/2021   ID:  Ashley Villegas, DOB [birth year], MRN [id number]  PCP:  Lafonda Mosses, MD  Cardiologist:  Berniece Salines, DO  Electrophysiologist:  None   Referring MD: [name], MD   " I am ok"  History of Present Illness:    Ashley Villegas is a 71 y.o. female with a hx of paroxysmal supraventricular tachycardia which was recently seen on her monitor, dyslipidemia, obesity, hypothyroidism, major depression, history of uterine cancer status post surgery and radiation here today for follow-up visit.  First saw the patient on June 20, 2021 at that time was experiencing intermittent discomfort and palpitations.  We will set the patient up for various testing including echo, CCTA as well as ZIO monitor.  She wore her ZIO monitor which did show evidence of paroxysmal SVT.  Echocardiogram and cardiac CT did not show any evidence of any pathologies.  Today she does tell me that she is expressing significant fatigue with daytime somnolence.  With intermittent palpitations as well.  She has not passed out no chest pain.  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, recurrent    Anxiety    Arthritis    Carpal tunnel syndrome, bilateral upper limbs    Complication of anesthesia    Depression    Family history of uterine cancer    GERD (gastroesophageal reflux disease)    History of radiation therapy 11/08/2016-12/27/2016   pelvis 45 Gy in 25 fractions, pelvis boost 14 gy in 7 fractions   History of radiation therapy    endometrial - pelvis IMRT 11/10/2019-11/27/2019   Hypothyroidism    on synthroid    PONV (postoperative nausea and vomiting)     Past Surgical History:  Procedure Laterality Date   CARPAL TUNNEL RELEASE Bilateral 2020   CESAREAN SECTION     COLONOSCOPY     DIAGNOSTIC LAPAROSCOPY     fibroid tumors on her ovaries    DILATION AND CURETTAGE OF UTERUS      HAND SURGERY Right 2019   carpal tunnel   HYSTEROSCOPY WITH D & C N/A 09/15/2014   Procedure: DILATATION AND CURETTAGE /HYSTEROSCOPY/MYOSURE RESECTION OF POLYP;  Surgeon: Janyth Pupa, DO;  Location: [facility];  Service: Gynecology;  Laterality: N/A;   KNEE SURGERY     LYMPH NODE DISSECTION N/A 10/26/2014   Procedure: POSS LYMPHADENECTOMY ;  Surgeon: [name], MD;  Location: [facility];  Service: Gynecology;  Laterality: N/A;   ROBOTIC ASSISTED TOTAL HYSTERECTOMY WITH BILATERAL SALPINGO OOPHERECTOMY Bilateral 10/26/2014   Procedure: ROBOTIC ASSISTED TOTAL HYSTERECTOMY WITH BILATERAL SALPINGO OOPHORECTOMY;  Surgeon: [name], MD;  Location: [facility];  Service: Gynecology;  Laterality: Bilateral;   TOTAL KNEE ARTHROPLASTY Left 04/15/2019   Procedure: LEFT TOTAL KNEE ARTHROPLASTY;  Surgeon: Melrose Nakayama, MD;  Location: [facility];  Service: Orthopedics;  Laterality: Left;   TUBAL LIGATION     WISDOM TOOTH EXTRACTION      Current Medications: Current Meds  Medication Sig   acetaminophen (TYLENOL) 500 MG tablet Take 500 mg by mouth every 6 (six) hours as needed.   albuterol (VENTOLIN HFA) 108 (90 Base) MCG/ACT inhaler SMARTSIG:2 Puff(s) Via Inhaler 4 Times Daily PRN   Cholecalciferol (VITAMIN D3) 25 MCG (1000 UT) CAPS Take 1,000 Units by mouth daily.   FLUoxetine (PROZAC) 40 MG capsule Take 40 mg by mouth daily.   furosemide (LASIX) 40 MG tablet  Take 40 mg by mouth 2 (two) times daily.   levothyroxine (SYNTHROID) 100 MCG tablet Take 100 mcg by mouth daily before breakfast.   metoprolol succinate (TOPROL-XL) 25 MG 24 hr tablet Take 0.5 tablets (12.5 mg total) by mouth daily.   rosuvastatin (CRESTOR) 10 MG tablet Take 1 tablet (10 mg total) by mouth daily.     Allergies:   Oxycodone and Hydrocodone   Social History   Socioeconomic History   Marital status: Single    Spouse name: Not on file   Number of children: 2   Years of education: Not on file   Highest education level: Not on file   Occupational History   Occupation: customer service  Tobacco Use   Smoking status: Former    Packs/day: 0.50    Years: 13.00    Total pack years: 6.50    Types: Cigarettes    Quit date: 10/20/2007    Years since quitting: 13.9   Smokeless tobacco: Never  Vaping Use   Vaping Use: Never used  Substance and Sexual Activity   Alcohol use: Yes    Alcohol/week: 1.0 standard drink of alcohol    Types: 1 Cans of beer per week    Comment: every 2 to 3 months   Drug use: No   Sexual activity: Not Currently  Other Topics Concern   Not on file  Social History Narrative   Not on file   Social Determinants of Health   Financial Resource Strain: Not on file  Food Insecurity: Not on file  Transportation Needs: Not on file  Physical Activity: Not on file  Stress: Not on file  Social Connections: Not on file     Family History: The patient's family history includes Breast cancer in her maternal aunt; Dementia in her father; Heart attack (age of onset: 22) in her paternal grandfather; Heart failure in her mother; Other (age of onset: 3) in her sister; Uterine cancer (age of onset: 77) in her mother.  ROS:   Review of Systems  Constitution: Negative for decreased appetite, fever and weight gain.  HENT: Negative for congestion, ear discharge, hoarse voice and sore throat.   Eyes: Negative for discharge, redness, vision loss in right eye and visual halos.  Cardiovascular: Negative for chest pain, dyspnea on exertion, leg swelling, orthopnea and palpitations.  Respiratory: Negative for cough, hemoptysis, shortness of breath and snoring.   Endocrine: Negative for heat intolerance and polyphagia.  Hematologic/Lymphatic: Negative for bleeding problem. Does not bruise/bleed easily.  Skin: Negative for flushing, nail changes, rash and suspicious lesions.  Musculoskeletal: Negative for arthritis, joint pain, muscle cramps, myalgias, neck pain and stiffness.  Gastrointestinal: Negative for  abdominal pain, bowel incontinence, diarrhea and excessive appetite.  Genitourinary: Negative for decreased libido, genital sores and incomplete emptying.  Neurological: Negative for brief paralysis, focal weakness, headaches and loss of balance.  Psychiatric/Behavioral: Negative for altered mental status, depression and suicidal ideas.  Allergic/Immunologic: Negative for HIV exposure and persistent infections.    EKGs/Labs/Other Studies Reviewed:    The following studies were reviewed today:   EKG:  None today   Zio monitor 07/14/2021 Patch Wear Time:  14 days and 0 hours (2023-04-01T11:01:34-0400 to 2023-04-15T11:01:38-0400)   Patient had a min HR of 44 bpm, max HR of 214 bpm, and avg HR of 70 bpm. Predominant underlying rhythm was Sinus Rhythm.    56 Supraventricular Tachycardia runs occurred, the run with the fastest interval lasting 10 beats with a max rate of 214 bpm,  the  longest lasting 15.4 secs with an avg rate of 97 bpm. Idioventricular Rhythm was present. Supraventricular Tachycardia was detected within +/- 45 seconds of symptomatic patient event(s). Isolated SVEs were rare (<1.0%), SVE Couplets were rare (<1.0%),  and SVE Triplets were rare (<1.0%). Isolated VEs were rare (<1.0%, 5533), VE Couplets were rare (<1.0%, 42), and VE Triplets were rare (<1.0%, 1).   Symptoms associated with supraventricular tachycardia and rare premature ventricular complexes.   Conclusion: This study is remarkable for symptomatic paroxysmal supraventricular tachycardia.  TTE 07/13/2021 IMPRESSIONS     1. Left ventricular ejection fraction, by estimation, is 60 to 65%. The  left ventricle has normal function. The left ventricle has no regional  wall motion abnormalities. There is mild concentric left ventricular  hypertrophy. Left ventricular diastolic  parameters were normal. The average left ventricular global longitudinal  strain is -20.2 %. The global longitudinal strain is normal.   2.  Right ventricular systolic function is normal. The right ventricular  size is normal. There is normal pulmonary artery systolic pressure.   3. The mitral valve is normal in structure. Trivial mitral valve  regurgitation. No evidence of mitral stenosis.   4. The aortic valve is tricuspid. Aortic valve regurgitation is not  visualized. No aortic stenosis is present.   5. The inferior vena cava is normal in size with greater than 50%  respiratory variability, suggesting right atrial pressure of 3 mmHg.   FINDINGS   Left Ventricle: Left ventricular ejection fraction, by estimation, is 60  to 65%. The left ventricle has normal function. The left ventricle has no  regional wall motion abnormalities. The average left ventricular global  longitudinal strain is -20.2 %.  The global longitudinal strain is normal. The left ventricular internal  cavity size was normal in size. There is mild concentric left ventricular  hypertrophy. Left ventricular diastolic parameters were normal.   Right Ventricle: The right ventricular size is normal. No increase in  right ventricular wall thickness. Right ventricular systolic function is  normal. There is normal pulmonary artery systolic pressure. The tricuspid  regurgitant velocity is 2.33 m/s, and   with an assumed right atrial pressure of 3 mmHg, the estimated right  ventricular systolic pressure is 82.5 mmHg.   Left Atrium: Left atrial size was normal in size.   Right Atrium: Right atrial size was normal in size.   Pericardium: There is no evidence of pericardial effusion. Presence of  epicardial fat layer.   Mitral Valve: The mitral valve is normal in structure. Trivial mitral  valve regurgitation. No evidence of mitral valve stenosis.   Tricuspid Valve: The tricuspid valve is normal in structure. Tricuspid  valve regurgitation is trivial. No evidence of tricuspid stenosis.   Aortic Valve: The aortic valve is tricuspid. Aortic valve regurgitation  is  not visualized. No aortic stenosis is present.   Pulmonic Valve: The pulmonic valve was normal in structure. Pulmonic valve  regurgitation is not visualized. No evidence of pulmonic stenosis.   Aorta: The aortic root is normal in size and structure.   Venous: The inferior vena cava is normal in size with greater than 50%  respiratory variability, suggesting right atrial pressure of 3 mmHg.   IAS/Shunts: No atrial level shunt detected by color flow Doppler.   CCTA 07/12/2021 ADDENDUM REPORT: 07/12/2021 23:20   CLINICAL DATA:  78F with chest pain   EXAM: Cardiac/Coronary CTA   TECHNIQUE: The patient was scanned on a [employer].   FINDINGS: A 100  kV prospective scan was triggered in the descending thoracic aorta at 111 HU's. Axial non-contrast 3 mm slices were carried out through the heart. The data set was analyzed on a dedicated work station and scored using the [place]. Gantry rotation speed was 250 msecs and collimation was .6 mm. 0.8 mg of sl NTG was given. The 3D data set was reconstructed in 5% intervals of the 35-75 % of the R-R cycle. Phases were analyzed on a dedicated work station using MPR, MIP and VRT modes. The patient received 80 cc of contrast.   Coronary Arteries:  Normal coronary origin.  Right dominance.   RCA is a large dominant artery that gives rise to PDA and PLA. There is no plaque.   Left main is a large artery that gives rise to LAD and LCX arteries.   LAD is a large vessel that has no plaque.   LCX is a non-dominant artery.  There is no plaque.   Other findings:   Left Ventricle: Normal size   Left Atrium: Normal size   Pulmonary Veins: Normal configuration   Right Ventricle: Normal size   Right Atrium: Normal size   Cardiac valves: No calcifications   Thoracic aorta: Normal size   Pulmonary Arteries: Normal size   Systemic Veins: Normal drainage   Pericardium: Normal thickness   IMPRESSION: 1. Coronary  calcium score of 0.   2. Normal coronary origin with right dominance.   3. No evidence of CAD.   CAD-RADS 0. No evidence of CAD (0%). Consider non-atherosclerotic causes of chest pain.   Recent Labs: 06/20/2021: Magnesium 2.2 10/10/2021: BUN 16; Creatinine 0.92; Potassium 4.1; Sodium 139  Recent Lipid Panel    Component Value Date/Time   CHOL 256 (H) 06/20/2021 1009   TRIG 165 (H) 06/20/2021 1009   HDL 51 06/20/2021 1009   CHOLHDL 5.0 (H) 06/20/2021 1009   LDLCALC 175 (H) 06/20/2021 1009    Physical Exam:    VS:  BP 128/76   Pulse 68   Ht 5' (1.524 m)   Wt 179 lb 6.4 oz (81.4 kg)   SpO2 96%   BMI 35.04 kg/m     Wt Readings from Last 3 Encounters:  10/13/21 179 lb 6.4 oz (81.4 kg)  10/10/21 178 lb (80.7 kg)  07/06/21 181 lb 9.6 oz (82.4 kg)     GEN: Well nourished, well developed in no acute distress HEENT: Normal NECK: No JVD; No carotid bruits LYMPHATICS: No lymphadenopathy CARDIAC: S1S2 noted,RRR, no murmurs, rubs, gallops RESPIRATORY:  Clear to auscultation without rales, wheezing or rhonchi  ABDOMEN: Soft, non-tender, non-distended, +bowel sounds, no guarding. EXTREMITIES: No edema, No cyanosis, no clubbing MUSCULOSKELETAL:  No deformity  SKIN: Warm and dry NEUROLOGIC:  Alert and oriented x 3, non-focal PSYCHIATRIC:  Normal affect, good insight  ASSESSMENT:    1. Vitamin D deficiency   2. Fatigue, unspecified type   3. PSVT (paroxysmal supraventricular tachycardia) ([place])   4. Hyperlipidemia, unspecified hyperlipidemia type   5. Daytime somnolence    PLAN:    Paroxysmal SVT-we discussed her monitor results which show significant burden of Szymon SVT which were symptomatic.  Plan to treat with low-dose beta-blocker Toprol-XL 12.5 mg daily.  Hyperlipidemia-she is on Crestor which was started back in March 2023, will get blood work today for follow-up from her medication use.  Obesity-the patient understands the need to lose weight with diet and  exercise. We have discussed specific strategies for this. Fatigue-she is experiencing fatigue which could  be multifactorial.  We will get a vitamin D level as well as vitamin B12.  Her CBC to make sure she is not anemic.  Daytime somnolence-her daytime somnolence may be related to her fatigue is suspicious for sleep apnea.  She has had a home study.  Which she may benefit from in lab study making sure that her home study was not a false negative.  I have asked the patient to discuss with her primary care provider who started the initial home study.  If this is not pursued at this time I see her at her follow-up visit we will pursue in lab study.   The patient is in agreement with the above plan. The patient left the office in stable condition.  The patient will follow up in 6 months or sooner if needed.   Medication Adjustments/Labs and Tests Ordered: Current medicines are reviewed at length with the patient today.  Concerns regarding medicines are outlined above.  Orders Placed This Encounter  Procedures   VITAMIN D 25 Hydroxy (Vit-D Deficiency, Fractures)   Vitamin B12   CBC   Lipid Profile   Meds ordered this encounter  Medications   metoprolol succinate (TOPROL-XL) 25 MG 24 hr tablet    Sig: Take 0.5 tablets (12.5 mg total) by mouth daily.    Dispense:  45 tablet    Refill:  3    Patient Instructions  Medication Instructions:  Your physician has recommended you make the following change in your medication:  START: Metoprolol XL 12.33m Daily. The tablet will be a 271mtablet but only take 0.5 of the tablet)  *If you need a refill on your cardiac medications before your next appointment, please call your pharmacy*   Lab Work: Vitamin D , Vitamin B-12, CBC & Lipid Panel  If you have labs (blood work) drawn today and your tests are completely normal, you will receive your results only by: [place] have MyChart) OR A paper copy in the mail If you have any lab test  that is abnormal or we need to change your treatment, we will call you to review the results.   Testing/Procedures: NONE   Follow-Up: [facility]  As part of our continuing mission to provide you with exceptional heart care, we have created designated Provider Care Teams.  These Care Teams include your primary Cardiologist (physician) and Advanced Practice Providers (APPs -  Physician Assistants and Nurse Practitioners) who all work together to provide you with the care you need, when you need it.  We recommend signing up for the patient portal called "MyChart".  Sign up information is provided on this After Visit Summary.  MyChart is used to connect with patients for Virtual Visits (Telemedicine).  Patients are able to view lab/test results, encounter notes, upcoming appointments, etc.  Non-urgent messages can be sent to your provider as well.   To learn more about what you can do with MyChart, go to [email or web address]   Your next appointment:   6 month(s)  The format for your next appointment:   In Person  Provider:   KaBerniece SalinesDO      Adopting a Healthy Lifestyle.  Know what a healthy weight is for you (roughly BMI <25) and aim to maintain this   Aim for 7+ servings of fruits and vegetables daily   65-80+ fluid ounces of water or unsweet tea for healthy kidneys   Limit to max  1 drink of alcohol per day; avoid smoking/tobacco   Limit animal fats in diet for cholesterol and heart health - choose grass fed whenever available   Avoid highly processed foods, and foods high in saturated/trans fats   Aim for low stress - take time to unwind and care for your mental health   Aim for 150 min of moderate intensity exercise weekly for heart health, and weights twice weekly for bone health   Aim for 7-9 hours of sleep daily   When it comes to diets, agreement about the perfect plan isnt easy to find, even among the  experts. Experts at the [employer] developed an idea known as the Healthy Eating Plate. Just imagine a plate divided into logical, healthy portions.   The emphasis is on diet quality:   Load up on vegetables and fruits - one-half of your plate: Aim for color and variety, and remember that potatoes dont count.   Go for whole grains - one-quarter of your plate: Whole wheat, barley, wheat berries, quinoa, oats, brown rice, and foods made with them. If you want pasta, go with whole wheat pasta.   Protein power - one-quarter of your plate: Fish, chicken, beans, and nuts are all healthy, versatile protein sources. Limit red meat.   The diet, however, does go beyond the plate, offering a few other suggestions.   Use healthy plant oils, such as olive, canola, soy, corn, sunflower and peanut. Check the labels, and avoid partially hydrogenated oil, which have unhealthy trans fats.   If youre thirsty, drink water. Coffee and tea are good in moderation, but skip sugary drinks and limit milk and dairy products to one or two daily servings.   The type of carbohydrate in the diet is more important than the amount. Some sources of carbohydrates, such as vegetables, fruits, whole grains, and beans-are healthier than others.   Finally, stay active  Signed, Berniece Salines, DO  10/13/2021 9:38 AM    [place]

## 2021-10-14 LAB — LIPID PANEL
Chol/HDL Ratio: 3.5 ratio (ref 0.0–4.4)
Cholesterol, Total: 150 mg/dL (ref 100–199)
HDL: 43 mg/dL (ref >39)
LDL Chol Calc (NIH): 76 mg/dL (ref 0–99)
Triglycerides: 182 mg/dL — ABNORMAL HIGH (ref 0–149)
VLDL Cholesterol Cal: 31 mg/dL (ref 5–40)

## 2021-10-14 LAB — CBC
Hematocrit: 40.1 % (ref 34.0–46.6)
Hemoglobin: 13.4 g/dL (ref 11.1–15.9)
MCH: 30.9 pg (ref 26.6–33.0)
MCHC: 33.4 g/dL (ref 31.5–35.7)
MCV: 92 fL (ref 79–97)
Platelets: 233 10*3/uL (ref 150–450)
RBC: 4.34 x10E6/uL (ref 3.77–5.28)
RDW: 12.9 % (ref 11.7–15.4)
WBC: 4.7 10*3/uL (ref 3.4–10.8)

## 2021-10-14 LAB — VITAMIN B12: Vitamin B-12: 283 pg/mL (ref 232–1245)

## 2021-10-14 LAB — VITAMIN D 25 HYDROXY (VIT D DEFICIENCY, FRACTURES): Vit D, 25-Hydroxy: 50.8 ng/mL (ref 30.0–100.0)

## 2021-10-19 ENCOUNTER — Ambulatory Visit (HOSPITAL_COMMUNITY)
Admission: RE | Admit: 2021-10-19 | Discharge: 2021-10-19 | Disposition: A | Discharge status: Home / Self Care | Payer: Medicare Other | Source: Ambulatory Visit | Attending: Gynecologic Oncology | Admitting: Gynecologic Oncology

## 2021-10-19 DIAGNOSIS — C541 Malignant neoplasm of endometrium: Secondary | ICD-10-CM | POA: Insufficient documentation

## 2021-10-19 DIAGNOSIS — K5730 Diverticulosis of large intestine without perforation or abscess without bleeding: Secondary | ICD-10-CM | POA: Diagnosis not present

## 2021-10-19 DIAGNOSIS — K760 Fatty (change of) liver, not elsewhere classified: Secondary | ICD-10-CM | POA: Diagnosis not present

## 2021-10-19 DIAGNOSIS — C55 Malignant neoplasm of uterus, part unspecified: Secondary | ICD-10-CM | POA: Diagnosis not present

## 2021-10-19 MED ORDER — IOHEXOL 9 MG/ML PO SOLN
500.0000 mL | ORAL | Status: AC
  Administered 2021-10-19 (×2): 500 mL via ORAL

## 2021-10-19 MED ORDER — IOHEXOL 9 MG/ML PO SOLN
ORAL | Status: AC
  Administered 2021-10-19: 500 mL
  Filled 2021-10-19: qty 1000

## 2021-10-19 MED ORDER — SODIUM CHLORIDE (PF) 0.9 % IJ SOLN
INTRAMUSCULAR | Status: AC
  Filled 2021-10-19: qty 50

## 2021-10-19 MED ORDER — IOHEXOL 300 MG/ML  SOLN
100.0000 mL | Freq: Once | INTRAMUSCULAR | Status: AC | PRN
  Administered 2021-10-19: 100 mL via INTRAVENOUS

## 2021-10-20 ENCOUNTER — Other Ambulatory Visit (HOSPITAL_COMMUNITY): Payer: Medicare Other

## 2021-10-23 ENCOUNTER — Telehealth: Payer: Self-pay

## 2021-10-23 NOTE — Telephone Encounter (Signed)
Per Ashley Villegas, I spoke to patient to make sure she saw the results of her CT scan. She states she did receive the results and Dr.Tuckers message. She is excited about it not being cancer, however, she would like for Dr.Tucker to call her and discuss the results further, She saw it states she has an umbilical hernia and fatty liver with possible cyst. This is all new to her and she wants to know what to do.   Pt aware Dr.Tucker is in clinic today but I would notify her of pt wanting to discuss results further.

## 2021-10-23 NOTE — Telephone Encounter (Signed)
Spoke with the patient.  Reviewed results in detail with her.  Discussed that there is no evidence of cancer as the cause of her intermittent right-sided pelvic pain.  Discussed keeping a diary of her pain to see if we can pinpoint if it may be related to bowel function, foods that she is eating, or activity.  She will keep me posted with results.

## 2021-11-03 DIAGNOSIS — Z8542 Personal history of malignant neoplasm of other parts of uterus: Secondary | ICD-10-CM | POA: Diagnosis not present

## 2021-11-03 DIAGNOSIS — E039 Hypothyroidism, unspecified: Secondary | ICD-10-CM | POA: Diagnosis not present

## 2021-11-03 DIAGNOSIS — E7800 Pure hypercholesterolemia, unspecified: Secondary | ICD-10-CM | POA: Diagnosis not present

## 2021-11-03 DIAGNOSIS — R0609 Other forms of dyspnea: Secondary | ICD-10-CM | POA: Diagnosis not present

## 2021-11-03 DIAGNOSIS — F330 Major depressive disorder, recurrent, mild: Secondary | ICD-10-CM | POA: Diagnosis not present

## 2021-11-03 DIAGNOSIS — I471 Supraventricular tachycardia: Secondary | ICD-10-CM | POA: Diagnosis not present

## 2021-11-03 DIAGNOSIS — F3341 Major depressive disorder, recurrent, in partial remission: Secondary | ICD-10-CM | POA: Diagnosis not present

## 2021-11-03 DIAGNOSIS — Z0000 Encounter for general adult medical examination without abnormal findings: Secondary | ICD-10-CM | POA: Diagnosis not present

## 2021-11-03 DIAGNOSIS — Z23 Encounter for immunization: Secondary | ICD-10-CM | POA: Diagnosis not present

## 2021-11-22 ENCOUNTER — Other Ambulatory Visit: Payer: Self-pay | Admitting: Internal Medicine

## 2021-11-22 DIAGNOSIS — Z1231 Encounter for screening mammogram for malignant neoplasm of breast: Secondary | ICD-10-CM

## 2021-11-30 ENCOUNTER — Ambulatory Visit
Admission: RE | Admit: 2021-11-30 | Discharge: 2021-11-30 | Disposition: A | Discharge status: Home / Self Care | Payer: Medicare Other | Source: Ambulatory Visit | Attending: Internal Medicine | Admitting: Internal Medicine

## 2021-11-30 DIAGNOSIS — Z1231 Encounter for screening mammogram for malignant neoplasm of breast: Secondary | ICD-10-CM

## 2021-12-28 DIAGNOSIS — Z23 Encounter for immunization: Secondary | ICD-10-CM | POA: Diagnosis not present

## 2022-01-03 DIAGNOSIS — F330 Major depressive disorder, recurrent, mild: Secondary | ICD-10-CM | POA: Diagnosis not present

## 2022-01-03 DIAGNOSIS — R030 Elevated blood-pressure reading, without diagnosis of hypertension: Secondary | ICD-10-CM | POA: Diagnosis not present

## 2022-01-03 DIAGNOSIS — E669 Obesity, unspecified: Secondary | ICD-10-CM | POA: Diagnosis not present

## 2022-01-10 NOTE — Progress Notes (Signed)
Radiation Oncology         [phone] ________________________________  Name: Ashley Villegas MRN: [id number]  Date: 01/11/2022  DOB: 04-Oct-1950  Follow-Up Visit Note  CC: [name], MD  [name], MD  No diagnosis found.  Diagnosis: Recurrent endometrial cancer, original stage IA grade 1, s/p hysterectomy  with enhancing lesion centered along the right levator ani muscle, status post additional radiation therapy  Interval Since Last Radiation: 2 years, 1 month, and 16 days   Radiation Treatment Dates: 11/10/2019 through 11/27/2019 Site Technique Total Dose (Gy) Dose per Fx (Gy) Completed Fx Beam Energies  Perineum: Pelvis IMRT 28/28 2 14/14 6X    Narrative:  The patient returns today for routine 6 month follow-up, she was last seen here for follow up on 07/06/21. Since her last visit, the patient followed up with Dr. Berline Lopes on 10/10/21. During which time, the patient  denied any abdominal pain  but did endorse occasional right sided pelvic pain with bowel movements occurring approximately once per week. The patient also reported this during her last visit with. Berline Lopes. Otherwise, the patient denied any symptoms concerning for disease recurrence. However, speculum exam performed during the visit showed atrophic vaginal mucosa with radiation changes and mild fullness in the cul-de-sac, which Dr. Berline Lopes suspected to be a redundant colon on rectovaginal examination.No masses or atypical vascularity were otherwise appreciated. Given her pelvic pain with bowel movements and findings on examination, Dr. Berline Lopes ordered a CT for further evaluation.   Subsequent CT of the abdomen and pelvis on 10/19/21 showed no evidence of intestinal obstruction, pneumoperitoneum, or hydronephrosis. CT also showed a fatty liver, small hiatal hernia, a possible cyst in the liver, and diverticulosis of the colon.   Other imaging performed in the interval includes:  -- Bilateral screening mammogram on  11/30/21 which showed no evidence of malignancy in either breast.  -- CT coronary on 07/12/21 for evaluation of chest pain showed a coronary calcium score of 0 and normal coronary origin with right dominance, and no evidence of CAD.    ***  Allergies:  is allergic to oxycodone and hydrocodone.  Meds: Current Outpatient Medications  Medication Sig Dispense Refill   acetaminophen (TYLENOL) 500 MG tablet Take 500 mg by mouth every 6 (six) hours as needed.     albuterol (VENTOLIN HFA) 108 (90 Base) MCG/ACT inhaler SMARTSIG:2 Puff(s) Via Inhaler 4 Times Daily PRN     Cholecalciferol (VITAMIN D3) 25 MCG (1000 UT) CAPS Take 1,000 Units by mouth daily.     FLUoxetine (PROZAC) 40 MG capsule Take 40 mg by mouth daily.     furosemide (LASIX) 40 MG tablet Take 40 mg by mouth 2 (two) times daily.     levothyroxine (SYNTHROID) 100 MCG tablet Take 100 mcg by mouth daily before breakfast.     metoprolol succinate (TOPROL-XL) 25 MG 24 hr tablet Take 0.5 tablets (12.5 mg total) by mouth daily. 45 tablet 3   rosuvastatin (CRESTOR) 10 MG tablet Take 1 tablet (10 mg total) by mouth daily. 90 tablet 3   No current facility-administered medications for this encounter.    Physical Findings: The patient is in no acute distress. Patient is alert and oriented.  vitals were not taken for this visit. .  No significant changes. Lungs are clear to auscultation bilaterally. Heart has regular rate and rhythm. No palpable cervical, supraclavicular, or axillary adenopathy. Abdomen soft, non-tender, normal bowel sounds.  On pelvic examination the external genitalia were unremarkable. A speculum exam was  performed. There are no mucosal lesions noted in the vaginal vault. A Pap smear was obtained of the proximal vagina. On bimanual and rectovaginal examination there were no pelvic masses appreciated. ***    Lab Findings: Lab Results  Component Value Date   WBC 4.7 10/13/2021   HGB 13.4 10/13/2021   HCT 40.1 10/13/2021    MCV 92 10/13/2021   PLT 233 10/13/2021    Radiographic Findings: No results found.  Impression:  Recurrent endometrial cancer, original stage IA grade 1, s/p hysterectomy  with enhancing lesion centered along the right levator ani muscle, status post additional radiation therapy  The patient is recovering from the effects of radiation.  ***  Plan:  ***   *** minutes of total time was spent for this patient encounter, including preparation, face-to-face counseling with the patient and coordination of care, physical exam, and documentation of the encounter. ____________________________________  Blair Promise, PhD, MD  This document serves as a record of services personally performed by Gery Pray, MD. It was created on his behalf by Roney Mans, a trained medical scribe. The creation of this record is based on the scribe's personal observations and the provider's statements to them. This document has been checked and approved by the attending provider.

## 2022-01-11 ENCOUNTER — Encounter: Payer: Self-pay | Admitting: Radiation Oncology

## 2022-01-11 ENCOUNTER — Ambulatory Visit
Admission: RE | Admit: 2022-01-11 | Discharge: 2022-01-11 | Disposition: A | Discharge status: Home / Self Care | Payer: Medicare Other | Source: Ambulatory Visit | Attending: Radiation Oncology | Admitting: Radiation Oncology

## 2022-01-11 VITALS — BP 143/61 | HR 61 | Temp 98.2°F | Resp 18 | Ht 60.0 in | Wt 182.1 lb

## 2022-01-11 DIAGNOSIS — Z8542 Personal history of malignant neoplasm of other parts of uterus: Secondary | ICD-10-CM | POA: Diagnosis not present

## 2022-01-11 DIAGNOSIS — Z79890 Hormone replacement therapy (postmenopausal): Secondary | ICD-10-CM | POA: Insufficient documentation

## 2022-01-11 DIAGNOSIS — Z923 Personal history of irradiation: Secondary | ICD-10-CM | POA: Insufficient documentation

## 2022-01-11 DIAGNOSIS — Z79899 Other long term (current) drug therapy: Secondary | ICD-10-CM | POA: Diagnosis not present

## 2022-01-11 DIAGNOSIS — C541 Malignant neoplasm of endometrium: Secondary | ICD-10-CM

## 2022-01-11 NOTE — Progress Notes (Signed)
Ashley Villegas is here today for follow up post radiation to the pelvic.  They completed their radiation on: 11/27/19  Does the patient complain of any of the following:  Pain: No Abdominal bloating: No Diarrhea/Constipation: No Nausea/Vomiting: No Vaginal Discharge: No Blood in Urine or Stool: No Urinary Issues (dysuria/incomplete emptying/ incontinence/ increased frequency/urgency): No Does patient report using vaginal dilator 2-3 times a week and/or sexually active 2-3 weeks: No Post radiation skin changes: No   Additional comments if applicable:    BP (!) 311/21 (BP Location: Left Arm, Patient Position: Sitting)   Pulse 61   Temp 98.2 F (36.8 C) (Oral)   Resp 18   Ht 5' (1.524 m)   Wt 182 lb 2 oz (82.6 kg)   SpO2 99%   BMI 35.57 kg/m

## 2022-01-29 DIAGNOSIS — M25562 Pain in left knee: Secondary | ICD-10-CM | POA: Diagnosis not present

## 2022-01-29 DIAGNOSIS — M25552 Pain in left hip: Secondary | ICD-10-CM | POA: Diagnosis not present

## 2022-01-29 DIAGNOSIS — Z96652 Presence of left artificial knee joint: Secondary | ICD-10-CM | POA: Diagnosis not present

## 2022-01-29 DIAGNOSIS — M25462 Effusion, left knee: Secondary | ICD-10-CM | POA: Diagnosis not present

## 2022-02-07 ENCOUNTER — Telehealth: Payer: Self-pay

## 2022-02-07 ENCOUNTER — Telehealth: Payer: Self-pay | Admitting: *Deleted

## 2022-02-07 NOTE — Telephone Encounter (Signed)
CALLED PATIENT TO INFORM OF FU APPT. WITH DR. Berline Lopes ON 04-06-22- ARRIVAL TIME- 3 PM, SPOKE WITH PATIENT AND SHE IS AWARE OF THIS APPT.

## 2022-02-07 NOTE — Telephone Encounter (Signed)
Enid Derry from (RAD [facility]) called office.  Pt is scheduled for a follow up on 04/06/22  at 3:15 with Dr. Berline Lopes.  Enid Derry to notify pt of appointment date and time.

## 2022-02-12 DIAGNOSIS — M25462 Effusion, left knee: Secondary | ICD-10-CM | POA: Diagnosis not present

## 2022-02-13 ENCOUNTER — Other Ambulatory Visit (HOSPITAL_COMMUNITY): Payer: Self-pay | Admitting: Orthopaedic Surgery

## 2022-02-13 DIAGNOSIS — M25562 Pain in left knee: Secondary | ICD-10-CM

## 2022-02-21 ENCOUNTER — Encounter (HOSPITAL_COMMUNITY)
Admission: RE | Admit: 2022-02-21 | Discharge: 2022-02-21 | Disposition: A | Discharge status: Home / Self Care | Payer: Medicare Other | Source: Ambulatory Visit | Attending: Orthopaedic Surgery | Admitting: Orthopaedic Surgery

## 2022-02-21 ENCOUNTER — Ambulatory Visit (HOSPITAL_COMMUNITY)
Admission: RE | Admit: 2022-02-21 | Discharge: 2022-02-21 | Disposition: A | Discharge status: Home / Self Care | Payer: Medicare Other | Source: Ambulatory Visit | Attending: Orthopaedic Surgery | Admitting: Orthopaedic Surgery

## 2022-02-21 DIAGNOSIS — M25562 Pain in left knee: Secondary | ICD-10-CM | POA: Insufficient documentation

## 2022-02-21 MED ORDER — TECHNETIUM TC 99M MEDRONATE IV KIT
20.0000 | PACK | Freq: Once | INTRAVENOUS | Status: AC | PRN
  Administered 2022-02-21: 20 via INTRAVENOUS

## 2022-02-22 DIAGNOSIS — F330 Major depressive disorder, recurrent, mild: Secondary | ICD-10-CM | POA: Diagnosis not present

## 2022-02-28 DIAGNOSIS — M545 Low back pain, unspecified: Secondary | ICD-10-CM | POA: Diagnosis not present

## 2022-03-01 ENCOUNTER — Other Ambulatory Visit: Payer: Self-pay | Admitting: Orthopedic Surgery

## 2022-03-01 DIAGNOSIS — M545 Low back pain, unspecified: Secondary | ICD-10-CM

## 2022-03-20 DIAGNOSIS — R062 Wheezing: Secondary | ICD-10-CM | POA: Diagnosis not present

## 2022-03-20 DIAGNOSIS — J069 Acute upper respiratory infection, unspecified: Secondary | ICD-10-CM | POA: Diagnosis not present

## 2022-03-25 DIAGNOSIS — F330 Major depressive disorder, recurrent, mild: Secondary | ICD-10-CM | POA: Diagnosis not present

## 2022-04-03 ENCOUNTER — Ambulatory Visit
Admission: RE | Admit: 2022-04-03 | Discharge: 2022-04-03 | Disposition: A | Discharge status: Home / Self Care | Payer: Medicare Other | Source: Ambulatory Visit | Attending: Orthopedic Surgery | Admitting: Orthopedic Surgery

## 2022-04-03 DIAGNOSIS — M48061 Spinal stenosis, lumbar region without neurogenic claudication: Secondary | ICD-10-CM | POA: Diagnosis not present

## 2022-04-03 DIAGNOSIS — M545 Low back pain, unspecified: Secondary | ICD-10-CM

## 2022-04-06 ENCOUNTER — Inpatient Hospital Stay: Payer: Medicare Other | Attending: Gynecologic Oncology | Admitting: Gynecologic Oncology

## 2022-04-06 ENCOUNTER — Other Ambulatory Visit: Payer: Self-pay

## 2022-04-06 ENCOUNTER — Encounter: Payer: Self-pay | Admitting: Gynecologic Oncology

## 2022-04-06 ENCOUNTER — Other Ambulatory Visit: Payer: Medicare Other

## 2022-04-06 VITALS — BP 127/57 | HR 79 | Temp 98.4°F | Resp 16 | Wt 177.0 lb

## 2022-04-06 DIAGNOSIS — R102 Pelvic and perineal pain: Secondary | ICD-10-CM | POA: Diagnosis not present

## 2022-04-06 DIAGNOSIS — Z8542 Personal history of malignant neoplasm of other parts of uterus: Secondary | ICD-10-CM | POA: Diagnosis not present

## 2022-04-06 DIAGNOSIS — Z90722 Acquired absence of ovaries, bilateral: Secondary | ICD-10-CM | POA: Insufficient documentation

## 2022-04-06 DIAGNOSIS — Z90710 Acquired absence of both cervix and uterus: Secondary | ICD-10-CM | POA: Diagnosis not present

## 2022-04-06 DIAGNOSIS — Z923 Personal history of irradiation: Secondary | ICD-10-CM | POA: Diagnosis not present

## 2022-04-06 DIAGNOSIS — Z08 Encounter for follow-up examination after completed treatment for malignant neoplasm: Secondary | ICD-10-CM | POA: Insufficient documentation

## 2022-04-06 DIAGNOSIS — C541 Malignant neoplasm of endometrium: Secondary | ICD-10-CM

## 2022-04-06 NOTE — Progress Notes (Signed)
Gynecologic Oncology Return Clinic Visit  04/06/22  Reason for Visit: Surveillance visit in the setting of recurrent uterine cancer   Treatment History: Oncology History Overview Note  Abnormal MSI, germline mutation was negative Endometrioid   Endometrial cancer ([place])  09/15/2014 Pathology Results   Endometrium, curettage - ENDOMETRIAL ADENOCARCINOMA. - SEE COMMENT. Microscopic Comment Although definitive characterization is best performed on resection specimen, as sampled, the endometrial adenocarcinoma appears to be endometrioid subtype, FIGO grade 1.    09/15/2014 Surgery   PreOp: postmenopausal bleeding, cervical stenosis PostOp: same and uterine polyp Procedure:  Hysteroscopy, Dilation and Curettage, Myosure polypectomy Surgeon: Dr. Janyth Pupa   Findings:8cm uterus with thickened polypoid-like endometrium, large 2cm irregular appearing polyp   Specimens: 1) endometrial curettings   10/26/2014 Pathology Results   1. Uterus +/- tubes/ovaries, neoplastic - INVASIVE ADENOCARCINOMA, ENDOMETRIOID TYPE, SEE COMMENT. - TUMOR INVOLVES LESS THAN ONE HALF MYOMETRIAL THICKNESS. - TUMOR INVOLVES UTERINE ADENOMYOSIS. - BENIGN LEIOMYOMATA (UP TO 2.5 CM). - BENIGN CERVICAL MUCOSA; NEGATIVE FOR INTRAEPITHELIAL LESION OR MALIGNANCY. - BENIGN RIGHT AND LEFT OVARIES; NEGATIVE FOR ATYPIA OR MALIGNANCY. - BENIGN RIGHT AND LEFT FALLOPIAN TUBE; NEGATIVE FOR ATYPIA OR MALIGNANCY. - BENIGN PARATUBAL CYSTS; NEGATIVE FOR ATYPIA OR MALIGNANCY. - SEE TUMOR SYNOPTIC TEMPLATE BELOW. 2. Lymph nodes, regional resection, right pelvic - FOUR LYMPH NODES, NEGATIVE FOR TUMOR (0/4). 3. Lymph nodes, regional resection, left pelvic - FOUR LYMPH NODES, NEGATIVE FOR TUMOR (0/4). Microscopic Comment 1. ONCOLOGY TABLE-UTERUS, CARCINOMA OR CARCINOSARCOMA Specimen: Uterus and bilateral fallopian tubes and ovaries. Procedure: Hysterectomy and bilateral salpingo-oophorectomy. Lymph node sampling performed:  Yes. Specimen integrity: Intact. Maximum tumor size: 4.0 cm (tumor involved entire endometrium) Histologic type: Adenocarcinoma, endometrioid type. Grade: 1 Myometrial invasion: 0.5 cm where myometrium is 1.5 cm in thickness Cervical stromal involvement: Absent. Extent of involvement of other organs: None Lymph - vascular invasion: Absent. Peritoneal washings: N/A Lymph nodes: # examined - 8 ; # positive - 0 Pelvic lymph nodes: N/A involved of N/A lymph nodes. Para-aortic lymph nodes: N/A involved of N/A lymph nodes. Other (specify involvement and site): N/A TNM code: pT1a, pN0 FIGO Stage (based on pathologic findings, needs clinical correlation): N/A Comments: None MSI testing abnormal   10/26/2014 Surgery   Surgery: Total robotic hysterectomy, bilateral salpingo-oophorectomy, bilateral pelvic lymph node dissection.    Surgeons:  Lucita Lora. Alycia Rossetti, MD; Lahoma Crocker, MD      Pathology: Uterus, cervix, bilateral tubes and ovaries, bilateral pelvic lymph nodes to pathology.    Operative findings: Omentum adherent to midline vertical incision. Fibroid uterus. Normal adnexa. Adhesions of anterior bladder to uterus. Frozen section with grade 1 cancer focally involving adenomyosis, minimal myometrial invasion.   11/17/2014 Imaging   Ct abdomen 1. Status post hysterectomy. Pelvic edema which is greater than typically seen 3 weeks postop. Suspicious for postoperative infection. 2. Bladder wall thickening and irregularity. Although this could be partially due to underdistention, Concurrent cystitis cannot be excluded. 3. Bilateral fluid density lesions along the pelvic sidewalls. Favored to represent seromas or lymphangiomas. No specific features to suggest abscess. If the patient's symptoms persist after appropriate antibiotic therapy, aspiration of the largest left pelvic sidewall "Lesion" should be considered. 4. Suspicion of mild hepatic steatosis. Indeterminate right hepatic lobe  lesion. If definitive characterization is desired in this patient with history of primary malignancy, nonemergent pre and post contrast abdominal MRI could be performed. 5. Hiatal hernia.   11/25/2014 Imaging   1. Persistent bilateral pelvic sidewall fluid collections, left greater than right, with left-sided inflammatory  changes. 2. Technically successful 12 French left pelvic abscess drain catheter placement. A sample of the aspirate was sent for Gram stain, culture and sensitivity.   12/08/2014 Imaging   CT abdomen and pelvis 1. Complete resolution of dominant fluid collection within the left hemipelvis following percutaneous drainage catheter placement. This percutaneous drainage catheter was subsequent removed intact at the patient's bedside. 2. Continued decrease in size of bilobed fluid collections within the right hemipelvis with superficial component measuring 1.6 cm and posterior component measuring 2.6 cm, indeterminate though both favored to represent evolving seromas. 3. Unchanged indeterminate approximately 1.2 cm hepatic lesion. Further evaluation with contrast-enhanced abdominal MRI could be performed as clinically indicated. 4. Colonic diverticulosis without evidence of diverticulitis.   10/19/2016 Imaging   New 2.6 cm soft tissue mass involving right vaginal cuff, consistent with locally recurrent carcinoma.   No other sites of metastatic disease identified.   Colonic diverticulosis, without radiographic evidence of diverticulitis.   Mild hepatic steatosis.     10/24/2016 Relapse/Recurrence   Vaginal cuff recurrence. No evidence of distant disease.   10/24/2016 Pathology Results   Vagina, biopsy, right apex - ADENOCARCINOMA - SEE COMMENT Microscopic Comment The morphologic features are consistent with the patient's previously diagnosed endometrioid adenocarcinoma   11/08/2016 - 12/27/2016 Radiation Therapy   45 Gy in 25 fractions of 1.8 Gy. The residual pelvic mass was  boosted to 14 Gy in 7 fractions of 2 Gy. No intracavitary lesion seen at completion of pelvic fields to boost with brachytherapy.   06/12/2017 Imaging   CT abdomen and pelvis 1. New 1.1 by 0.8 cm right lower lobe pulmonary nodule, concerning for pulmonary metastatic disease. Possibilities for further assessment include biopsy or nuclear medicine PET-CT to assess this lesion and the rest of the neck/chest/abdomen/pelvis for other potential hypermetabolic lesions. 2. The previous soft tissue mass along the right vaginal cuff is no longer present. 3. Stable and likely benign right hepatic lobe hypodense lesion. 4. Other imaging findings of potential clinical significance: Sigmoid colon diverticulosis. Aortic Atherosclerosis (ICD10-I70.0). Multilevel lumbar degenerative disc disease. Mild cardiomegaly. Small type 1 hiatal hernia.   07/05/2017 Imaging   Ct chest Several bilateral pulmonary nodules measuring up to 11 mm, highly suspicious for pulmonary metastases.    No evidence of lymphadenopathy or pleural effusion.   Aortic Atherosclerosis (ICD10-I70.0).   07/25/2017 Imaging   Status post CT-guided lung nodule biopsy. Tissue specimen sent to pathology for complete histopathologic analysis.   07/25/2017 Pathology Results   Lung, needle/core biopsy(ies), RLL - METASTATIC ADENOCARCINOMA - SEE COMMENT Microscopic Comment By immunohistochemistry, the neoplastic cells are positive for Pax-8 and ER but negative for TTF-1. Overall, this immunoprofile is consistent with metastasis from the patient's known endometrioid adenocarcinoma   07/30/2017 Cancer Staging   Staging form: Corpus Uteri - Carcinoma and Carcinosarcoma, AJCC 8th Edition - Clinical: Stage IVB (rcT1, cN0, pM1) - Signed by Heath Lark, MD on 07/30/2017   08/09/2017 - 11/21/2017 Chemotherapy   The patient had pembrolizumab for chemotherapy treatment.    10/10/2017 Imaging   1. Interval decrease in size and contour. The of previously described  right and left lower lobe pulmonary nodules. 2. Interval increase in size of mediastinal and hilar adenopathy, potentially metastatic in etiology. 3. Aortic Atherosclerosis (ICD10-I70.0).   11/08/2017 - 11/10/2017 Hospital Admission   She was admitted to the hospital due to uncontrolled pain   11/29/2017 Imaging   Chest Impression:  No thoracic metastasis.  No discrete pulmonary nodules identified.  Abdomen / Pelvis Impression:  No evidence of local recurrence or metastatic endometrial carcinoma in the abdomen pelvis.   02/21/2018 Imaging   1. No findings to suggest metastatic disease in the chest, abdomen or pelvis. 2. Mild colonic diverticulosis without evidence of acute diverticulitis at this time. 3. Aortic atherosclerosis. 4. Additional incidental findings, as above.   03/10/2018 Imaging   Bone scan: No scintigraphic evidence of osseous metastatic disease.   05/22/2018 Imaging   1. Stable exam. No evidence of recurrent or metastatic carcinoma within the chest, abdomen, or pelvis. 2. Colonic diverticulosis, without radiographic evidence of diverticulitis.   11/13/2018 Imaging   1. No evidence of recurrent or metastatic carcinoma within the chest, abdomen, or pelvis. 2. Colonic diverticulosis, without radiographic evidence of diverticulitis. 3. Stable hepatic steatosis.   Aortic Atherosclerosis (ICD10-I70.0).   05/22/2019 Imaging   1. No evidence of recurrent or metastatic disease in the chest, abdomen, or pelvis status post hysterectomy. 2. Previously seen metastatic pulmonary nodules in the bilateral lower lobes remain resolved. 3. Unchanged irregular opacities of the peripheral right upper lobe, likely chronic sequelae of prior infection or inflammation. 4. Hepatic steatosis. 5. Sigmoid diverticulosis. 6. Aortic Atherosclerosis (ICD10-I70.0).     10/18/2019 Imaging   1. Heterogeneously enhancing lesion measuring 2.4 x 1.9 x 1.9 cm immediately to the right of the urethra  along the anterolateral aspect of the vaginal wall, likely centered in the right levator ani 9 musculature. This has a malignant appearance, but whether this represents a new primary lesion or a metastatic lesion is uncertain on today's examination. 2. Mild colonic diverticulosis.     11/02/2019 Imaging   CT chest 1. No evidence of metastatic disease. 2. Hepatic steatosis. 3.  Aortic atherosclerosis (ICD10-I70.0).   11/10/2019 - 11/27/2019 Radiation Therapy   Radiation Treatment Dates: 11/10/2019 through 11/27/2019 Site Technique Total Dose (Gy) Dose per Fx (Gy) Completed Fx Beam Energies  Perineum: Pelvis IMRT 28/28 2 14/14 6X      01/21/2020 Imaging   1. Complete resolution of the heterogeneously enhancing lesion seen along the right vaginal introitus on the prior study. No residual measurable lesion at this location today. No new or progressive findings in the pelvis/pelvic floor on today's exam. 2. Left colonic diverticulosis without features of diverticulitis.   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    Metastasis to lung ([place])  07/30/2017 Initial Diagnosis   Metastasis to lung ([place])   08/09/2017 - 11/01/2017 Chemotherapy   The patient had pembrolizumab (KEYTRUDA) 200 mg in sodium chloride 0.9 % 50 mL chemo infusion, 200 mg, Intravenous, Once, 5 of 7 cycles Administration: 200 mg (08/09/2017), 200 mg (08/30/2017), 200 mg (09/20/2017), 200 mg (10/11/2017), 200 mg (11/01/2017)  for chemotherapy treatment.     CT A/P 09/2021: There is no evidence of intestinal obstruction or pneumoperitoneum. There is no  hydronephrosis. Appendix is not dilated.   Fatty liver. Small hiatal hernia. Possible cyst in liver. Diverticulosis of colon.   Other findings as described in the body of the report.  Interval History: Patient reports being sick recently.  Ultimately got a sinus infection.  Was given an inhaler by her primary care doctor given shortness of breath  and wheezing.  Is still having to use the inhaler but less so.  Took a COVID test which was negative.  She continues to have some right-sided pelvic pain that comes and goes, unchanged since her last visit.  This often happens when she has been sitting for longer periods of time.  She denies any vaginal discharge or bleeding.  Endorses baseline urinary function.  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, recurrent    Anxiety    Arthritis    Carpal tunnel syndrome, bilateral upper limbs    Complication of anesthesia    Depression    Family history of uterine cancer    GERD (gastroesophageal reflux disease)    History of radiation therapy 11/08/2016-12/27/2016   pelvis 45 Gy in 25 fractions, pelvis boost 14 gy in 7 fractions   History of radiation therapy    endometrial - pelvis IMRT 11/10/2019-11/27/2019   Hypothyroidism    on synthroid    PONV (postoperative nausea and vomiting)     Past Surgical History:  Procedure Laterality Date   CARPAL TUNNEL RELEASE Bilateral 2020   CESAREAN SECTION     COLONOSCOPY     DIAGNOSTIC LAPAROSCOPY     fibroid tumors on her ovaries    DILATION AND CURETTAGE OF UTERUS     HAND SURGERY Right 2019   carpal tunnel   HYSTEROSCOPY WITH D & C N/A 09/15/2014   Procedure: DILATATION AND CURETTAGE /HYSTEROSCOPY/MYOSURE RESECTION OF POLYP;  Surgeon: Janyth Pupa, DO;  Location: [facility];  Service: Gynecology;  Laterality: N/A;   KNEE SURGERY     LYMPH NODE DISSECTION N/A 10/26/2014   Procedure: POSS LYMPHADENECTOMY ;  Surgeon:  [name], MD;  Location: [facility];  Service: Gynecology;  Laterality: N/A;   ROBOTIC ASSISTED TOTAL HYSTERECTOMY WITH BILATERAL SALPINGO OOPHERECTOMY Bilateral 10/26/2014   Procedure: ROBOTIC ASSISTED TOTAL HYSTERECTOMY WITH BILATERAL SALPINGO OOPHORECTOMY;  Surgeon: [name], MD;  Location: [facility];  Service: Gynecology;  Laterality: Bilateral;   TOTAL KNEE ARTHROPLASTY Left 04/15/2019   Procedure: LEFT TOTAL KNEE ARTHROPLASTY;  Surgeon: Melrose Nakayama, MD;  Location: [facility];  Service: Orthopedics;  Laterality: Left;   TUBAL LIGATION     WISDOM TOOTH EXTRACTION      Family History  Problem Relation Age of Onset   Breast cancer Maternal Aunt        dx late 50's/60's, died in 18's   Uterine cancer Mother 63   Heart failure Mother    Dementia Father    Heart attack Paternal Grandfather 28   Other Sister 66       had abnromal finding on mammogram, unsure if cancer. Was told to 'remove part of of body'    Social History   Socioeconomic History   Marital status: Single    Spouse name: Not on file   Number of children: 2   Years of education: Not on file   Highest education level: Not on file  Occupational History   Occupation: customer service  Tobacco Use   Smoking status: Former    Packs/day: 0.50    Years: 13.00    Total pack years: 6.50    Types: Cigarettes    Quit date: 10/20/2007    Years since quitting: 14.4   Smokeless tobacco: Never  Vaping Use   Vaping Use: Never used  Substance and Sexual Activity   Alcohol use: Yes    Alcohol/week:  1.0 standard drink of alcohol    Types: 1 Cans of beer per week    Comment: every 2 to 3 months   Drug use: No   Sexual activity: Not Currently  Other Topics Concern   Not on file  Social History Narrative   Not on file   Social Determinants of Health   Financial Resource Strain: Not on file  Food Insecurity: Not on file  Transportation Needs: Not on file  Physical Activity: Not on file  Stress: Not on file  Social  Connections: Not on file    Current Medications:  Current Outpatient Medications:    acetaminophen (TYLENOL) 500 MG tablet, Take 500 mg by mouth every 6 (six) hours as needed., Disp: , Rfl:    albuterol (VENTOLIN HFA) 108 (90 Base) MCG/ACT inhaler, SMARTSIG:2 Puff(s) Via Inhaler 4 Times Daily PRN, Disp: , Rfl:    Cholecalciferol (VITAMIN D3) 25 MCG (1000 UT) CAPS, Take 1,000 Units by mouth daily., Disp: , Rfl:    FLUoxetine (PROZAC) 40 MG capsule, Take 40 mg by mouth daily. Per patient, not taken in several months, Disp: , Rfl:    furosemide (LASIX) 40 MG tablet, Take 40 mg by mouth 2 (two) times daily., Disp: , Rfl:    levothyroxine (SYNTHROID) 100 MCG tablet, Take 100 mcg by mouth daily before breakfast., Disp: , Rfl:    metoprolol succinate (TOPROL-XL) 25 MG 24 hr tablet, Take 0.5 tablets (12.5 mg total) by mouth daily., Disp: 45 tablet, Rfl: 3   rosuvastatin (CRESTOR) 10 MG tablet, Take 1 tablet (10 mg total) by mouth daily., Disp: 90 tablet, Rfl: 3  Review of Systems: + Shortness of breath, wheezing, hot flashes Denies appetite changes, fevers, chills, fatigue, unexplained weight changes. Denies hearing loss, neck lumps or masses, mouth sores, ringing in ears or voice changes. Denies chest pain or palpitations. Denies leg swelling. Denies abdominal distention, pain, blood in stools, constipation, diarrhea, nausea, vomiting, or early satiety. Denies pain with intercourse, dysuria, frequency, hematuria or incontinence. Denies pelvic pain, vaginal bleeding or vaginal discharge.   Denies joint pain, back pain or muscle pain/cramps. Denies itching, rash, or wounds. Denies dizziness, headaches, numbness or seizures. Denies swollen lymph nodes or glands, denies easy bruising or bleeding. Denies anxiety, depression, confusion, or decreased concentration.  Physical Exam: BP (!) 127/57 (BP Location: Right Arm, Patient Position: Sitting)   Pulse 79   Temp 98.4 F (36.9 C) (Oral)   Resp 16    Wt 177 lb (80.3 kg)   SpO2 99%   BMI 34.57 kg/m  General: Alert, oriented, no acute distress. HEENT: Normocephalic, atraumatic, sclera anicteric. Chest: Mild expiratory wheezing intermittently, otherwise lungs clear to auscultation bilaterally. Abdomen: Obese, soft, nontender.  Normoactive bowel sounds.  No masses or hepatosplenomegaly appreciated.  Well-healed incisions. Extremities: Grossly normal range of motion.  Warm, well perfused.  No edema bilaterally. Skin: No rashes or lesions noted. Lymphatics: No cervical, supraclavicular, or inguinal adenopathy. GU: Normal appearing external genitalia without erythema, excoriation, or lesions.  Speculum exam reveals atrophic vaginal mucosa with radiation changes present.  No masses or atypical vascularity noted.  Bimanual exam reveals cuff intact, no nodularity or masses.  Patient has moderate pain with palpation along the right pubis/ischium.  I do not palpate any nodularity or masses in this location.  Laboratory & Radiologic Studies: None new  Assessment & Plan: Ashley Villegas is a 72 y.o. woman with recurrent low risk uterine cancer, initially with vaginal cuff recurrence in 2018, most recently  treated for para-vaginal recurrence in 2021 with RT with complete disease resolution, presenting for surveillance visit.   The patient continues to have some right-sided pelvic pain.  On exam, she has point tenderness along the right distal vagina, over the bone.  Given the location of her last recurrence, I have suggested an MRI to rule out any relation to recurrent disease.  My suspicion is that this is some muscular versus bony pain.  If imaging is negative for recurrent disease, discussed possibility of physical therapy.  We reviewed again plan for surveillance including visits every 3 months, alternating between radiation oncology and our clinic.  We also reviewed signs and symptoms that would be concerning for disease recurrence and the  patient knows to call the clinic if she develops any of these before her next scheduled visit.   22 minutes of total time was spent for this patient encounter, including preparation, face-to-face counseling with the patient and coordination of care, and documentation of the encounter.  Jeral Pinch, MD  Division of Gynecologic Oncology  Department of Obstetrics and Gynecology  [facility]

## 2022-04-06 NOTE — Patient Instructions (Signed)
It was good to see you today.  Given the pain when I push along the right side of the vagina along the bone, I am ordering an MRI to make sure that there is no evidence of cancer recurrence.  If this is the case, then I think we should consider some pelvic floor physical therapy.  I will see you for follow-up in 6 months.  As always, if you develop any new and concerning symptoms before your next visit, please call to see me sooner.

## 2022-04-11 ENCOUNTER — Other Ambulatory Visit (HOSPITAL_COMMUNITY): Payer: Medicare Other

## 2022-04-11 ENCOUNTER — Other Ambulatory Visit: Payer: Self-pay | Admitting: Gynecologic Oncology

## 2022-04-11 ENCOUNTER — Telehealth: Payer: Self-pay | Admitting: *Deleted

## 2022-04-11 DIAGNOSIS — N644 Mastodynia: Secondary | ICD-10-CM

## 2022-04-11 NOTE — Telephone Encounter (Signed)
Patient complains of new onset breast pain. Left breast on left side. "Piercing" Unable to bend over without pain- movement triggers pain. Denies redness, localized lump, or change to breast appearance. Denies fever.  Pain initially started on Monday, improved yesterday and is much worse today. Normal MMG in 11-2021 Patient teary on phone and states she is really scared. Emotional support provided.  Advised will review with Dr Berline Lopes and call back. Patient does not have routine GYN provider.

## 2022-04-11 NOTE — Telephone Encounter (Signed)
  Call to patient per response from Joylene John NP below.  She feels a little better now but reports has not pulled anything- no physical lifting or straining. Feels pain is definitely in the breast. Agreeable to diagnostic mammogram at [facility]. Phone number given to Breast center to call to schedule- order is in.       Did she say she pulled anything?   [facility] We can order a diagnostic mammogram and if things get better before then she can always cancel it. She should try heat and ice  Now [facility]

## 2022-04-13 ENCOUNTER — Ambulatory Visit: Payer: Medicare Other

## 2022-04-13 ENCOUNTER — Telehealth: Payer: Self-pay | Admitting: Gynecologic Oncology

## 2022-04-13 ENCOUNTER — Ambulatory Visit
Admission: RE | Admit: 2022-04-13 | Discharge: 2022-04-13 | Disposition: A | Discharge status: Home / Self Care | Payer: Medicare Other | Source: Ambulatory Visit | Attending: Gynecologic Oncology | Admitting: Gynecologic Oncology

## 2022-04-13 DIAGNOSIS — N644 Mastodynia: Secondary | ICD-10-CM

## 2022-04-13 NOTE — Telephone Encounter (Signed)
Called patient -discussed MRI results from today.  Reviewed things that she can try from a symptom standpoint.  Have asked her to give my office a call next week for an update.  Hopefully, pain will resolve spontaneously.  Jeral Pinch MD Gynecologic Oncology

## 2022-04-16 ENCOUNTER — Telehealth: Payer: Self-pay | Admitting: *Deleted

## 2022-04-16 ENCOUNTER — Ambulatory Visit (HOSPITAL_COMMUNITY)
Admission: RE | Admit: 2022-04-16 | Discharge: 2022-04-16 | Disposition: A | Discharge status: Home / Self Care | Payer: Medicare Other | Source: Ambulatory Visit | Attending: Gynecologic Oncology | Admitting: Gynecologic Oncology

## 2022-04-16 ENCOUNTER — Telehealth: Payer: Self-pay | Admitting: Gynecologic Oncology

## 2022-04-16 DIAGNOSIS — R102 Pelvic and perineal pain: Secondary | ICD-10-CM

## 2022-04-16 DIAGNOSIS — C541 Malignant neoplasm of endometrium: Secondary | ICD-10-CM | POA: Diagnosis not present

## 2022-04-16 DIAGNOSIS — Z90710 Acquired absence of both cervix and uterus: Secondary | ICD-10-CM | POA: Diagnosis not present

## 2022-04-16 DIAGNOSIS — K5730 Diverticulosis of large intestine without perforation or abscess without bleeding: Secondary | ICD-10-CM | POA: Diagnosis not present

## 2022-04-16 DIAGNOSIS — C55 Malignant neoplasm of uterus, part unspecified: Secondary | ICD-10-CM | POA: Diagnosis not present

## 2022-04-16 DIAGNOSIS — Z90722 Acquired absence of ovaries, bilateral: Secondary | ICD-10-CM | POA: Diagnosis not present

## 2022-04-16 MED ORDER — GADOBUTROL 1 MMOL/ML IV SOLN
8.0000 mL | Freq: Once | INTRAVENOUS | Status: AC | PRN
  Administered 2022-04-16: 8 mL via INTRAVENOUS

## 2022-04-16 NOTE — Telephone Encounter (Signed)
Call to patient for update on breast pain. Reports breast pain has "eased up." Has already talked to Dr Berline Lopes. See phone note.

## 2022-04-16 NOTE — Telephone Encounter (Signed)
Discussed with the patient option of pelvic floor physical therapy referral as MRI is negative for any evidence of recurrent.  Patient amenable.  Will place referral today.  She notes that breast pain has improved.  Will contact my office if she continues to have improvement.  Jeral Pinch MD Gynecologic Oncology

## 2022-04-16 NOTE — Therapy (Unsigned)
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, recurrent    Anxiety    Arthritis    Carpal tunnel syndrome, bilateral upper limbs    Complication of anesthesia    Depression    Family history of uterine cancer    GERD (gastroesophageal reflux disease)    History of radiation therapy 11/08/2016-12/27/2016   pelvis 45 Gy in 25 fractions, pelvis boost 14 gy in 7 fractions   History of radiation therapy    endometrial - pelvis IMRT 11/10/2019-11/27/2019   Hypothyroidism    on synthroid    PONV (postoperative nausea and vomiting)    Past Surgical History:  Procedure Laterality Date   CARPAL TUNNEL RELEASE Bilateral 2020   CESAREAN SECTION     COLONOSCOPY     DIAGNOSTIC LAPAROSCOPY     fibroid tumors on her ovaries    DILATION AND CURETTAGE OF UTERUS     HAND SURGERY Right 2019   carpal tunnel   HYSTEROSCOPY WITH D & C N/A 09/15/2014   Procedure: DILATATION AND CURETTAGE /HYSTEROSCOPY/MYOSURE RESECTION OF POLYP;  Surgeon: Janyth Pupa, DO;  Location: [facility];  Service: Gynecology;  Laterality: N/A;   KNEE SURGERY     LYMPH NODE DISSECTION N/A 10/26/2014   Procedure: POSS LYMPHADENECTOMY ;  Surgeon: [name], MD;  Location: [facility];  Service: Gynecology;  Laterality: N/A;   ROBOTIC ASSISTED TOTAL HYSTERECTOMY WITH BILATERAL SALPINGO OOPHERECTOMY Bilateral 10/26/2014   Procedure: ROBOTIC ASSISTED TOTAL HYSTERECTOMY WITH BILATERAL SALPINGO OOPHORECTOMY;  Surgeon: [name], MD;  Location: [facility];  Service: Gynecology;  Laterality: Bilateral;   TOTAL KNEE ARTHROPLASTY Left 04/15/2019   Procedure: LEFT TOTAL KNEE ARTHROPLASTY;  Surgeon: Melrose Nakayama, MD;  Location: [facility];  Service: Orthopedics;  Laterality: Left;   TUBAL LIGATION     WISDOM TOOTH  EXTRACTION     Patient Active Problem List   Diagnosis Date Noted   Preventive measure 11/17/2019   Cancer associated pain 11/03/2019   Class II obesity 05/26/2019   Primary osteoarthritis of left knee 04/15/2019   Inflammatory arthritis 03/14/2018   Neuropathy 01/16/2018   Vitamin D deficiency 11/11/2017   Hypothyroid 11/09/2017   Total body pain 11/09/2017   Sinusitis 10/11/2017   Mediastinal lymphadenopathy 10/11/2017   Skin rash 10/11/2017   Genetic testing 08/29/2017   Hypothyroidism 08/08/2017   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  Rationale for Evaluation and Treatment: Rehabilitation  ONSET DATE: ***  SUBJECTIVE:  SUBJECTIVE STATEMENT: *** Fluid intake: {Yes/No:304960894}   PAIN:  Are you having pain? {yes/no:20286} NPRS scale: ***/10 Pain location: {pelvic pain location:27098}  Pain type: {type:313116} Pain description: {PAIN DESCRIPTION:21022940}   Aggravating factors: *** Relieving factors: ***  PRECAUTIONS: {Therapy precautions:24002}  WEIGHT BEARING RESTRICTIONS: {Yes ***/No:24003}  FALLS:  Has patient fallen in last 6 months? {fallsyesno:27318}  LIVING ENVIRONMENT: Lives with: {[facility] lives with:25569::"lives with their family"} Lives in: {Lives in:25570} Stairs: {opstairs:27293} Has following equipment at home: {Assistive devices:23999}  OCCUPATION:  ***  PLOF: {PLOF:24004}  PATIENT GOALS: ***  PERTINENT HISTORY:  *** Sexual abuse: {Yes/No:304960894}  BOWEL MOVEMENT: Pain with bowel movement: {yes/no:20286} Type of bowel movement:{PT BM type:27100} Fully empty rectum: {Yes/No:304960894} Leakage: {Yes/No:304960894} Pads: {Yes/No:304960894} Fiber supplement: {Yes/No:304960894}  URINATION: Pain with urination: {yes/no:20286} Fully empty bladder: {Yes/No:304960894} Stream: {PT urination:27102} Urgency: {Yes/No:304960894} Frequency: *** Leakage: {PT leakage:27103} Pads: {Yes/No:304960894}  INTERCOURSE: Pain with intercourse: {pain with intercourse PA:27099} Ability to have vaginal penetration:  {Yes/No:304960894} Climax: *** Marinoff Scale: ***/3  PREGNANCY: Vaginal deliveries *** Tearing {Yes***/No:304960894} C-section deliveries *** Currently pregnant {Yes***/No:304960894}  PROLAPSE: {PT prolapse:27101}   OBJECTIVE:   DIAGNOSTIC FINDINGS:  ***  PATIENT SURVEYS:  {rehab surveys:24030}  PFIQ-7 ***  COGNITION: Overall cognitive status: {cognition:24006}     SENSATION: Light touch: {intact/deficits:24005} Proprioception: {intact/deficits:24005}  MUSCLE LENGTH: Hamstrings: Right *** deg; Left *** deg Thomas test: Right *** deg; Left *** deg  LUMBAR SPECIAL TESTS:  {lumbar special test:25242}  FUNCTIONAL TESTS:  {Functional tests:24029}  GAIT: Distance walked: *** Assistive device utilized: {Assistive devices:23999} Level of assistance: {Levels of assistance:24026} Comments: ***  POSTURE: {posture:25561}  PELVIC ALIGNMENT:  LUMBARAROM/PROM:  A/PROM A/PROM  eval  Flexion   Extension   Right lateral flexion   Left lateral flexion   Right rotation   Left rotation    (Blank rows = not tested)  LOWER EXTREMITY ROM:  {AROM/PROM:27142} ROM Right eval Left eval  Hip flexion    Hip extension    Hip abduction    Hip adduction    Hip internal rotation    Hip external rotation     Knee flexion    Knee extension    Ankle dorsiflexion    Ankle plantarflexion    Ankle inversion    Ankle eversion     (Blank rows = not tested)  LOWER EXTREMITY MMT:  MMT Right eval Left eval  Hip flexion    Hip extension    Hip abduction    Hip adduction    Hip internal rotation    Hip external rotation    Knee flexion    Knee extension    Ankle dorsiflexion    Ankle plantarflexion    Ankle inversion    Ankle eversion     PALPATION:   General  ***                External Perineal Exam ***                             Internal Pelvic Floor ***  Patient confirms identification and approves PT to assess internal pelvic floor and treatment {yes/no:20286}  PELVIC MMT:   MMT eval  Vaginal   Internal Anal Sphincter   External Anal Sphincter   Puborectalis   Diastasis Recti   (Blank rows = not tested)        TONE: ***  PROLAPSE: ***  TODAY'S TREATMENT:  DATE: ***  EVAL ***   PATIENT EDUCATION:  Education details: *** Person educated: {Person educated:25204} Education method: {Education Method:25205} Education comprehension: {Education Comprehension:25206}  HOME EXERCISE PROGRAM: ***  ASSESSMENT:  CLINICAL IMPRESSION: Patient is a *** y.o. *** who was seen today for physical therapy evaluation and treatment for ***.   OBJECTIVE IMPAIRMENTS: {opptimpairments:25111}.   ACTIVITY LIMITATIONS: {activitylimitations:27494}  PARTICIPATION LIMITATIONS: {participationrestrictions:25113}  PERSONAL FACTORS: {Personal factors:25162} are also affecting patient's functional outcome.   REHAB POTENTIAL: {rehabpotential:25112}  CLINICAL DECISION MAKING: {clinical decision making:25114}  EVALUATION COMPLEXITY: {Evaluation complexity:25115}   GOALS: Goals reviewed with patient? {yes/no:20286}  SHORT TERM GOALS: Target date:  ***  *** Baseline: Goal status: {GOALSTATUS:25110}  2.  *** Baseline:  Goal status: {GOALSTATUS:25110}  3.  *** Baseline:  Goal status: {GOALSTATUS:25110}  4.  *** Baseline:  Goal status: {GOALSTATUS:25110}  5.  *** Baseline:  Goal status: {GOALSTATUS:25110}  6.  *** Baseline:  Goal status: {GOALSTATUS:25110}  LONG TERM GOALS: Target date: ***  *** Baseline:  Goal status: {GOALSTATUS:25110}  2.  *** Baseline:  Goal status: {GOALSTATUS:25110}  3.  *** Baseline:  Goal status: {GOALSTATUS:25110}  4.  *** Baseline:  Goal status: {GOALSTATUS:25110}  5.  *** Baseline:  Goal status: {GOALSTATUS:25110}  6.  *** Baseline:  Goal status: {GOALSTATUS:25110}  PLAN:  PT FREQUENCY: {rehab frequency:25116}  PT DURATION: {rehab duration:25117}  PLANNED INTERVENTIONS: {rehab planned interventions:25118::"Therapeutic exercises","Therapeutic activity","Neuromuscular re-education","Balance training","Gait training","Patient/Family education","Self Care","Joint mobilization"}  PLAN FOR NEXT SESSION: ***   Camillo Flaming Brennden Masten, PT 04/16/2022, 5:25 PM

## 2022-04-17 ENCOUNTER — Ambulatory Visit: Payer: Medicare Other | Attending: Gynecologic Oncology | Admitting: Physical Therapy

## 2022-04-17 ENCOUNTER — Other Ambulatory Visit: Payer: Self-pay

## 2022-04-17 ENCOUNTER — Encounter: Payer: Self-pay | Admitting: Physical Therapy

## 2022-04-17 DIAGNOSIS — R102 Pelvic and perineal pain: Secondary | ICD-10-CM | POA: Diagnosis not present

## 2022-04-17 DIAGNOSIS — R293 Abnormal posture: Secondary | ICD-10-CM

## 2022-04-17 DIAGNOSIS — M6281 Muscle weakness (generalized): Secondary | ICD-10-CM | POA: Diagnosis not present

## 2022-04-17 DIAGNOSIS — M62838 Other muscle spasm: Secondary | ICD-10-CM

## 2022-04-17 NOTE — Therapy (Unsigned)
OUTPATIENT PHYSICAL THERAPY FEMALE PELVIC EVALUATION   Patient Name: Ashley Villegas MRN: [id number] DOB:[birth year], 72 y.o., female Today's Date: 04/17/2022  END OF SESSION:  PT End of Session - 04/17/22 1716     Visit Number 1    Date for PT Re-Evaluation 07/10/22    Authorization Type Medicare    PT Start Time 1148    PT Stop Time 1231    PT Time Calculation (min) 43 min    Activity Tolerance Patient tolerated treatment well;Patient limited by pain    Behavior During Therapy [facility] for tasks assessed/performed             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, recurrent    Anxiety    Arthritis    Carpal tunnel syndrome, bilateral upper limbs    Complication of anesthesia    Depression    Family history of uterine cancer    GERD (gastroesophageal reflux disease)    History of radiation therapy 11/08/2016-12/27/2016   pelvis 45 Gy in 25 fractions, pelvis boost 14 gy in 7 fractions   History of radiation therapy    endometrial - pelvis IMRT 11/10/2019-11/27/2019   Hypothyroidism    on synthroid    PONV (postoperative nausea and vomiting)    Past Surgical History:  Procedure Laterality Date   CARPAL TUNNEL RELEASE Bilateral 2020   CESAREAN SECTION     COLONOSCOPY     DIAGNOSTIC LAPAROSCOPY     fibroid tumors on her ovaries    DILATION AND CURETTAGE OF UTERUS     HAND SURGERY Right 2019   carpal tunnel   HYSTEROSCOPY WITH D & C N/A 09/15/2014   Procedure: DILATATION AND CURETTAGE /HYSTEROSCOPY/MYOSURE RESECTION OF POLYP;  Surgeon: Janyth Pupa, DO;  Location: [facility];  Service: Gynecology;  Laterality: N/A;   KNEE SURGERY     LYMPH NODE DISSECTION N/A 10/26/2014   Procedure: POSS LYMPHADENECTOMY ;  Surgeon: [name], MD;  Location: [facility];  Service: Gynecology;  Laterality: N/A;   ROBOTIC ASSISTED TOTAL HYSTERECTOMY WITH BILATERAL SALPINGO OOPHERECTOMY Bilateral 10/26/2014   Procedure: ROBOTIC  ASSISTED TOTAL HYSTERECTOMY WITH BILATERAL SALPINGO OOPHORECTOMY;  Surgeon: [name], MD;  Location: [facility];  Service: Gynecology;  Laterality: Bilateral;   TOTAL KNEE ARTHROPLASTY Left 04/15/2019   Procedure: LEFT TOTAL KNEE ARTHROPLASTY;  Surgeon: Melrose Nakayama, MD;  Location: [facility];  Service: Orthopedics;  Laterality: Left;   TUBAL LIGATION     WISDOM TOOTH EXTRACTION     Patient Active Problem List   Diagnosis Date Noted   Preventive measure 11/17/2019   Cancer associated pain 11/03/2019   Class II obesity 05/26/2019   Primary osteoarthritis of left knee 04/15/2019   Inflammatory arthritis 03/14/2018   Neuropathy 01/16/2018   Vitamin D deficiency 11/11/2017   Hypothyroid 11/09/2017   Total body pain 11/09/2017   Sinusitis 10/11/2017   Mediastinal lymphadenopathy 10/11/2017   Skin rash 10/11/2017   Genetic testing 08/29/2017   Hypothyroidism 08/08/2017   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[place]) 07/30/2017   Depression 07/30/2017   Encounter for antineoplastic immunotherapy 07/30/2017   Metastasis to lung ([place]) 07/30/2017   Groin pain, right 07/30/2017   Family history of uterine cancer    Left groin pain    Endometrial cancer ([place])     PCP: Charlane Ferretti, MD   REFERRING PROVIDER: Lafonda Mosses, MD   REFERRING DIAG: R10.2 (ICD-10-CM) - Vaginal pain   THERAPY  DIAG:  Other muscle spasm  Muscle weakness (generalized)  Abnormal posture  Rationale for Evaluation and Treatment: Rehabilitation  ONSET DATE: at 6 months ago  SUBJECTIVE:                                                                                                                                                                                           SUBJECTIVE STATEMENT: Pain is with bowel movements and sometimes crossing legs hurts.  Back pain is another issue and happens after standing 5-10 minutes.  I have a bulging disc L4/5 *** Fluid intake: Yes: 4 x 16 oz water and coffee in the  morning    PAIN:  Are you having pain? Yes NPRS scale: 6-10/10 Pain location: Anterior and groin, deep  Pain type: sharp Pain description: intermittent   Aggravating factors: bowel movements are the worst, when ankles crossed feel it coming on Relieving factors: uncrossing legs or just takes 5-10 min after bowel movements to go away  PRECAUTIONS: None  WEIGHT BEARING RESTRICTIONS: No  FALLS:  Has patient fallen in last 6 months? No  LIVING ENVIRONMENT: Lives with: lives alone Lives in: House/apartment   OCCUPATION: retired  PLOF: Independent  PATIENT GOALS: not having pain  PERTINENT HISTORY:  Lt TKA, complete hysterectomy, endometrial cancer, pelvic radiation therapy 2018 Sexual abuse: No  BOWEL MOVEMENT: Pain with bowel movement: Yes Type of bowel movement:Type (Bristol Stool Scale) normal, regular, does strain Fully empty rectum: Yes:   Leakage: No Pads: No Fiber supplement:   URINATION: Pain with urination: No Fully empty bladder: Yes:   Stream: Strong Urgency: Yes:   Frequency: every 2 hours Leakage: Urge to void and Walking to the bathroom Pads: Yes: only if I go out  INTERCOURSE: Pain with intercourse: not sexually active   PREGNANCY: Vaginal deliveries 1 Tearing  C-section deliveries 1 Currently pregnant  Miscarriage : 1  PROLAPSE: None   OBJECTIVE:   DIAGNOSTIC FINDINGS:    PATIENT SURVEYS:    PFIQ-7   COGNITION: Overall cognitive status: Within functional limits for tasks assessed     SENSATION: Light touch: {intact/deficits:24005} Proprioception: {intact/deficits:24005}  MUSCLE LENGTH: Hamstrings: 75%; Left + pain in knee and back but not pain that is consistent with nerve impingement Thomas test:  LUMBAR SPECIAL TESTS:  Straight leg raise test: Negative  FUNCTIONAL TESTS:  Single leg - needs UE assistance - trendelenburg bil  GAIT:  Comments: WFL   POSTURE: increased lumbar lordosis, increased thoracic  kyphosis, anterior pelvic tilt, and weight shift right  PELVIC ALIGNMENT:  LUMBARAROM/PROM:  A/PROM A/PROM  eval  Flexion WFL +pain coming back to standing  Extension  Right lateral flexion   Left lateral flexion   Right rotation   Left rotation    (Blank rows = not tested)  LOWER EXTREMITY ROM:  {AROM/PROM:27142} ROM Right eval Left eval  Hip flexion  +pain  Hip extension    Hip abduction    Hip adduction    Hip internal rotation    Hip external rotation  +pain  Knee flexion    Knee extension    Ankle dorsiflexion    Ankle plantarflexion    Ankle inversion    Ankle eversion     (Blank rows = not tested)  LOWER EXTREMITY MMT: MMT 0-5 scale MMT Right eval Left eval  Hip flexion    Hip extension 5 4/5  Hip abduction 5 4/5  Hip adduction 5 5  Hip internal rotation    Hip external rotation    Knee flexion    Knee extension    Ankle dorsiflexion    Ankle plantarflexion    Ankle inversion    Ankle eversion     PALPATION:   General  lumbar tight                External Perineal Exam TTP Rt TP and ischial cavernosis                             Internal Pelvic Floor superficial and pressing into levators bil very TTP unable to palpate further, no increased tenderness to bladder  Patient confirms identification and approves PT to assess internal pelvic floor and treatment Yes No emotional/communication barriers or cognitive limitation. Patient is motivated to learn. Patient understands and agrees with treatment goals and plan. PT explains patient will be examined in standing, sitting, and lying down to see how their muscles and joints work. When they are ready, they will be asked to remove their underwear so PT can examine their perineum. The patient is also given the option of providing their own chaperone as one is not provided in our facility. The patient also has the right and is explained the right to defer or refuse any part of the evaluation or treatment  including the internal exam. With the patient's consent, PT will use one gloved finger to gently assess the muscles of the pelvic floor, seeing how well it contracts and relaxes and if there is muscle symmetry. After, the patient will get dressed and PT and patient will discuss exam findings and plan of care. PT and patient discuss plan of care, schedule, attendance policy and HEP activities.   PELVIC MMT:   MMT eval  Vaginal 1/5, 1 rep with pain  Internal Anal Sphincter   External Anal Sphincter   Puborectalis   Diastasis Recti   (Blank rows = not tested)        TONE: high  PROLAPSE: no  TODAY'S TREATMENT:  DATE: 04/17/22  EVAL and initial breathing and butterfly strech   PATIENT EDUCATION:  Education details: *** Person educated: Patient Education method: Consulting civil engineer, Demonstration, Corporate treasurer cues, Verbal cues, and Handouts Education comprehension: verbalized understanding and returned demonstration  HOME EXERCISE PROGRAM: ***  ASSESSMENT:  CLINICAL IMPRESSION: Patient is a *** y.o. *** who was seen today for physical therapy evaluation and treatment for ***.   OBJECTIVE IMPAIRMENTS: {opptimpairments:25111}.   ACTIVITY LIMITATIONS: {activitylimitations:27494}  PARTICIPATION LIMITATIONS: {participationrestrictions:25113}  PERSONAL FACTORS: {Personal factors:25162} are also affecting patient's functional outcome.   REHAB POTENTIAL: {rehabpotential:25112}  CLINICAL DECISION MAKING: {clinical decision making:25114}  EVALUATION COMPLEXITY: {Evaluation complexity:25115}   GOALS: Goals reviewed with patient? {yes/no:20286}  SHORT TERM GOALS: Target date: ***  *** Baseline: Goal status: {GOALSTATUS:25110}  2.  *** Baseline:  Goal status: {GOALSTATUS:25110}  3.  *** Baseline:  Goal status: {GOALSTATUS:25110}  4.  *** Baseline:  Goal  status: {GOALSTATUS:25110}  5.  *** Baseline:  Goal status: {GOALSTATUS:25110}  6.  *** Baseline:  Goal status: {GOALSTATUS:25110}  LONG TERM GOALS: Target date: ***  *** Baseline:  Goal status: {GOALSTATUS:25110}  2.  *** Baseline:  Goal status: {GOALSTATUS:25110}  3.  *** Baseline:  Goal status: {GOALSTATUS:25110}  4.  *** Baseline:  Goal status: {GOALSTATUS:25110}  5.  *** Baseline:  Goal status: {GOALSTATUS:25110}  6.  *** Baseline:  Goal status: {GOALSTATUS:25110}  PLAN:  PT FREQUENCY: {rehab frequency:25116}  PT DURATION: {rehab duration:25117}  PLANNED INTERVENTIONS: {rehab planned interventions:25118::"Therapeutic exercises","Therapeutic activity","Neuromuscular re-education","Balance training","Gait training","Patient/Family education","Self Care","Joint mobilization"}  PLAN FOR NEXT SESSION: ***   Camillo Flaming Mozes Sagar, PT 04/17/2022, 5:16 PM

## 2022-04-18 ENCOUNTER — Other Ambulatory Visit: Payer: Medicare Other

## 2022-04-19 ENCOUNTER — Ambulatory Visit: Payer: Medicare Other | Admitting: Physical Therapy

## 2022-04-19 ENCOUNTER — Encounter: Payer: Self-pay | Admitting: Physical Therapy

## 2022-04-19 DIAGNOSIS — M6281 Muscle weakness (generalized): Secondary | ICD-10-CM | POA: Diagnosis not present

## 2022-04-19 DIAGNOSIS — R293 Abnormal posture: Secondary | ICD-10-CM

## 2022-04-19 DIAGNOSIS — M62838 Other muscle spasm: Secondary | ICD-10-CM

## 2022-04-19 DIAGNOSIS — R102 Pelvic and perineal pain: Secondary | ICD-10-CM | POA: Diagnosis not present

## 2022-04-19 NOTE — Therapy (Signed)
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, recurrent    Anxiety    Arthritis    Carpal tunnel syndrome, bilateral upper limbs    Complication of anesthesia    Depression    Family history of uterine cancer    GERD (gastroesophageal reflux disease)    History of radiation therapy 11/08/2016-12/27/2016   pelvis 45 Gy in 25 fractions, pelvis boost 14 gy in 7 fractions   History of radiation therapy    endometrial - pelvis IMRT 11/10/2019-11/27/2019   Hypothyroidism    on synthroid    PONV (postoperative nausea and vomiting)    Past Surgical History:  Procedure Laterality Date   CARPAL TUNNEL RELEASE Bilateral 2020   CESAREAN SECTION     COLONOSCOPY     DIAGNOSTIC LAPAROSCOPY     fibroid tumors on her ovaries    DILATION AND CURETTAGE OF UTERUS     HAND SURGERY Right 2019   carpal tunnel   HYSTEROSCOPY WITH D & C N/A 09/15/2014   Procedure: DILATATION AND CURETTAGE /HYSTEROSCOPY/MYOSURE RESECTION OF POLYP;  Surgeon: Janyth Pupa, DO;  Location: [facility];  Service: Gynecology;  Laterality: N/A;   KNEE SURGERY     LYMPH NODE DISSECTION N/A 10/26/2014   Procedure: POSS LYMPHADENECTOMY ;  Surgeon: [name], MD;  Location: [facility];  Service: Gynecology;  Laterality: N/A;   ROBOTIC ASSISTED TOTAL HYSTERECTOMY WITH BILATERAL SALPINGO OOPHERECTOMY Bilateral 10/26/2014   Procedure: ROBOTIC ASSISTED TOTAL HYSTERECTOMY  WITH BILATERAL SALPINGO OOPHORECTOMY;  Surgeon: [name], MD;  Location: [facility];  Service: Gynecology;  Laterality: Bilateral;   TOTAL KNEE ARTHROPLASTY Left 04/15/2019   Procedure: LEFT TOTAL KNEE ARTHROPLASTY;  Surgeon: Melrose Nakayama, MD;  Location: [facility];  Service: Orthopedics;  Laterality: Left;   TUBAL LIGATION     WISDOM TOOTH EXTRACTION     Patient Active Problem List   Diagnosis Date Noted   Preventive measure 11/17/2019   Cancer associated pain 11/03/2019   Class II obesity 05/26/2019   Primary osteoarthritis of left knee 04/15/2019   Inflammatory arthritis 03/14/2018   Neuropathy 01/16/2018   Vitamin D deficiency 11/11/2017   Hypothyroid 11/09/2017   Total body pain 11/09/2017   Sinusitis 10/11/2017   Mediastinal lymphadenopathy 10/11/2017   Skin rash 10/11/2017   Genetic testing 08/29/2017   Hypothyroidism 08/08/2017   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[place]) 07/30/2017   Depression 07/30/2017   Encounter for antineoplastic immunotherapy 07/30/2017   Metastasis to lung ([place]) 07/30/2017   Groin pain, right 07/30/2017   Family history of uterine cancer    Left groin pain    Endometrial cancer ([place])     PCP: Charlane Ferretti, MD   REFERRING PROVIDER: Lafonda Mosses, MD   REFERRING DIAG: R10.2 (ICD-10-CM) - Vaginal pain   THERAPY DIAG:  Other muscle spasm  Muscle weakness (generalized)  Abnormal posture  Rationale for Evaluation and Treatment: Rehabilitation  ONSET DATE: at 6 months ago  SUBJECTIVE:                                                                                                                                                                                           SUBJECTIVE STATEMENT: Pain is the same.  Doing the stretch and I feel my adductors, they are getting used to it.  Fluid intake: Yes: 4 x 16 oz water and coffee in the morning    PAIN:  PAIN:  Are you having pain? No   PRECAUTIONS: None  WEIGHT BEARING RESTRICTIONS:  No  FALLS:  Has patient fallen in last 6 months? No  LIVING ENVIRONMENT: Lives with: lives alone Lives in: House/apartment   OCCUPATION: retired  PLOF: Independent  PATIENT GOALS: not having pain  PERTINENT HISTORY:  Lt TKA, complete hysterectomy, endometrial cancer, pelvic radiation therapy 2018 Sexual abuse: No  BOWEL MOVEMENT: Pain with bowel movement: Yes Type of bowel movement:Type (Bristol Stool Scale) normal, regular, does strain Fully empty rectum: Yes:   Leakage: No Pads: No Fiber supplement:   URINATION: Pain with urination: No Fully empty bladder: Yes:   Stream: Strong Urgency: Yes:   Frequency: every 2 hours Leakage: Urge to void and Walking to the bathroom Pads: Yes: only if I go out  INTERCOURSE: Pain with intercourse: not sexually active   PREGNANCY: Vaginal deliveries 1 Tearing  C-section deliveries 1 Currently pregnant  Miscarriage : 1  PROLAPSE: None   OBJECTIVE:   DIAGNOSTIC FINDINGS:  MRI spine:  chronic straightening of lumbar lordosis, mild retrolisthesis of L4 on L5 and anterolisthesis L5 on S1 stable from one year ago    No lower cord root abnormalities  L3-4 mild bil bulge and foraminal sprurring  L4-5 mild facet hypertroph and bil disc bulge and foraminal stenosis  L5-S1 mild anterolisthesis  PATIENT SURVEYS:    PFIQ-7   COGNITION: Overall cognitive status: Within functional limits for tasks assessed     SENSATION: Light touch:  Proprioception:   MUSCLE LENGTH: Hamstrings: 75%; Left + pain in knee and back but not pain that is consistent with nerve impingement Thomas test:  LUMBAR SPECIAL TESTS:  Straight leg raise test: Negative  FUNCTIONAL TESTS:  Single leg - needs UE assistance - trendelenburg bil  GAIT:  Comments: WFL   POSTURE: increased lumbar lordosis, increased thoracic kyphosis, anterior pelvic tilt, and weight shift right  PELVIC ALIGNMENT:  LUMBARAROM/PROM:  A/PROM A/PROM  eval   Flexion WFL +pain coming back to standing  Extension  Right lateral flexion   Left lateral flexion   Right rotation   Left rotation    (Blank rows = not tested)  LOWER EXTREMITY ROM:  Passive ROM Right eval Left eval  Hip flexion 80% 75% +pain  Hip extension    Hip abduction    Hip adduction    Hip internal rotation    Hip external rotation  75% +pain  Knee flexion    Knee extension    Ankle dorsiflexion    Ankle plantarflexion    Ankle inversion    Ankle eversion     (Blank rows = not tested)  LOWER EXTREMITY MMT: MMT 0-5 scale MMT Right eval Left eval  Hip flexion    Hip extension 5 4/5  Hip abduction 5 4/5  Hip adduction 5 5  Hip internal rotation    Hip external rotation    Knee flexion    Knee extension    Ankle dorsiflexion    Ankle plantarflexion    Ankle inversion    Ankle eversion     PALPATION:   General  lumbar tight, right groin TTP                External Perineal Exam TTP Rt transverse peroneus and ischial cavernosis                             Internal Pelvic Floor superficial and pressing into levators bil very TTP unable to palpate further, no increased tenderness to bladder, urethra lifts with kegel but no circular contraction  Patient confirms identification and approves PT to assess internal pelvic floor and treatment Yes No emotional/communication barriers or cognitive limitation. Patient is motivated to learn. Patient understands and agrees with treatment goals and plan. PT explains patient will be examined in standing, sitting, and lying down to see how their muscles and joints work. When they are ready, they will be asked to remove their underwear so PT can examine their perineum. The patient is also given the option of providing their own chaperone as one is not provided in our facility. The patient also has the right and is explained the right to defer or refuse any part of the evaluation or treatment including the internal exam. With  the patient's consent, PT will use one gloved finger to gently assess the muscles of the pelvic floor, seeing how well it contracts and relaxes and if there is muscle symmetry. After, the patient will get dressed and PT and patient will discuss exam findings and plan of care. PT and patient discuss plan of care, schedule, attendance policy and HEP activities.   PELVIC MMT:   MMT eval  Vaginal 1-2/5, 1 rep with pain and needed cues to get to 2/5  Internal Anal Sphincter   External Anal Sphincter   Puborectalis   Diastasis Recti   (Blank rows = not tested)        TONE: high  PROLAPSE: no  TODAY'S TREATMENT:  DATE: 04/19/22  Manual to glutes and lumbar - STM and addaday Exercises in semi-reclined - has pain with Rt hip IR but able to do with LE in extension.  Pt did well with  butterfly, lumbar rotation to the Rt, pelvic tilt with cues for pelvic tilt   PATIENT EDUCATION:  Education details: Access Code: [email or web address] Person educated: Patient Education method: Explanation, Demonstration, Tactile cues, Verbal cues, and Handouts Education comprehension: verbalized understanding and returned demonstration  HOME EXERCISE PROGRAM: Access Code: [email or web address] K URL: [email or web address] Date: 04/18/2022 Prepared by: Jari Favre  Exercises - Supported Butterfly Stretch with Pelvic Floor Relaxation  - 1 x daily - 7 x weekly - 3 sets - 10 reps - Diaphragmatic Breathing at 90/90 Supported  - 1 x daily - 7 x weekly - 3 sets - 10 reps  ASSESSMENT:  CLINICAL IMPRESSION: Patient was given more stretches and AROM to do for imporved mobility.  Pt needed cues to do exercises correctly and not bulge abdomen when doing pelvic tilts.  Pt was given updated HEP to continue working on improvements at home.  Pt will benefit from skilled PT to address all above  mentioned impairments for pain management and improved function.   OBJECTIVE IMPAIRMENTS: decreased coordination, decreased endurance, decreased ROM, decreased strength, increased muscle spasms, impaired flexibility, impaired tone, postural dysfunction, and pain.   ACTIVITY LIMITATIONS: lifting, bending, sitting, and standing  PARTICIPATION LIMITATIONS: meal prep, cleaning, interpersonal relationship, and community activity  PERSONAL FACTORS: Time since onset of injury/illness/exacerbation and 1-2 comorbidities: history of cancer and radiation treatment, knee replacement Lt  are also affecting patient's functional outcome.   REHAB POTENTIAL: Good  CLINICAL DECISION MAKING: Evolving/moderate complexity  EVALUATION COMPLEXITY: Moderate   GOALS: Goals reviewed with patient? Yes  SHORT TERM GOALS: Target date: 05/15/22  Ind with initial HEP Baseline: Goal status: MET  2.  Pt reports 20% less low back pain during typical daily activities Baseline:  Goal status: IN PROGRESS   LONG TERM GOALS: Target date: 07/10/22  Ind with toileting techniques Baseline:  Goal status: INITIAL  2.  Pt will be able to have bowel movement without increased pain Baseline:  Goal status: INITIAL  3.  Pt will have at least 50% less lumbar pain with daily activities Baseline:  Goal status: INITIAL  4.  Pt will be independent with advanced HEP to maintain improvements made throughout therapy  Baseline:  Goal status: INITIAL  5.  Pt will be able to stand for at least 30 minutes without increased pain for daily activities Baseline:  Goal status: INITIAL   PLAN:  PT FREQUENCY: 1x/week  PT DURATION: 12 weeks  PLANNED INTERVENTIONS: Therapeutic exercises, Therapeutic activity, Neuromuscular re-education, Balance training, Gait training, Patient/Family education, Self Care, Joint mobilization, Aquatic Therapy, Dry Needling, Electrical stimulation, Cryotherapy, Moist heat, Taping, Biofeedback,  Manual therapy, and Re-evaluation  PLAN FOR NEXT SESSION: back and hip flexibility and A/PROM, activate transversus abdominus, discuss dry needling to lumbar   Camillo Flaming Liliann File, PT 04/19/2022, 5:08 PM

## 2022-04-20 DIAGNOSIS — M545 Low back pain, unspecified: Secondary | ICD-10-CM | POA: Diagnosis not present

## 2022-04-24 DIAGNOSIS — M47816 Spondylosis without myelopathy or radiculopathy, lumbar region: Secondary | ICD-10-CM | POA: Diagnosis not present

## 2022-04-25 ENCOUNTER — Ambulatory Visit: Payer: Medicare Other | Admitting: Physical Therapy

## 2022-04-25 DIAGNOSIS — M6281 Muscle weakness (generalized): Secondary | ICD-10-CM | POA: Diagnosis not present

## 2022-04-25 DIAGNOSIS — F330 Major depressive disorder, recurrent, mild: Secondary | ICD-10-CM | POA: Diagnosis not present

## 2022-04-25 DIAGNOSIS — R293 Abnormal posture: Secondary | ICD-10-CM

## 2022-04-25 DIAGNOSIS — R102 Pelvic and perineal pain: Secondary | ICD-10-CM | POA: Diagnosis not present

## 2022-04-25 DIAGNOSIS — M62838 Other muscle spasm: Secondary | ICD-10-CM

## 2022-04-25 NOTE — Therapy (Signed)
OUTPATIENT PHYSICAL THERAPY FEMALE PELVIC TREATMENT   Patient Name: Ashley Villegas MRN: [id number] DOB:05-20-1950, 72 y.o., female Today's Date: 04/25/2022  END OF SESSION:  PT End of Session - 04/25/22 1023     Visit Number 3    Date for PT Re-Evaluation 07/10/22    Authorization Type Medicare    PT Start Time 3893    PT Stop Time 1102    PT Time Calculation (min) 39 min    Activity Tolerance Patient tolerated treatment well    Behavior During Therapy [facility] for tasks assessed/performed               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, recurrent    Anxiety    Arthritis    Carpal tunnel syndrome, bilateral upper limbs    Complication of anesthesia    Depression    Family history of uterine cancer    GERD (gastroesophageal reflux disease)    History of radiation therapy 11/08/2016-12/27/2016   pelvis 45 Gy in 25 fractions, pelvis boost 14 gy in 7 fractions   History of radiation therapy    endometrial - pelvis IMRT 11/10/2019-11/27/2019   Hypothyroidism    on synthroid    PONV (postoperative nausea and vomiting)    Past Surgical History:  Procedure Laterality Date   CARPAL TUNNEL RELEASE Bilateral 2020   CESAREAN SECTION     COLONOSCOPY     DIAGNOSTIC LAPAROSCOPY     fibroid tumors on her ovaries    DILATION AND CURETTAGE OF UTERUS     HAND SURGERY Right 2019   carpal tunnel   HYSTEROSCOPY WITH D & C N/A 09/15/2014   Procedure: DILATATION AND CURETTAGE /HYSTEROSCOPY/MYOSURE RESECTION OF POLYP;  Surgeon: Janyth Pupa, DO;  Location: [facility];  Service: Gynecology;  Laterality: N/A;   KNEE SURGERY     LYMPH NODE DISSECTION N/A 10/26/2014   Procedure: POSS LYMPHADENECTOMY ;  Surgeon: [name], MD;  Location: [facility];  Service: Gynecology;  Laterality: N/A;   ROBOTIC ASSISTED TOTAL HYSTERECTOMY WITH BILATERAL SALPINGO OOPHERECTOMY Bilateral 10/26/2014   Procedure: ROBOTIC ASSISTED TOTAL HYSTERECTOMY  WITH BILATERAL SALPINGO OOPHORECTOMY;  Surgeon: [name], MD;  Location: [facility];  Service: Gynecology;  Laterality: Bilateral;   TOTAL KNEE ARTHROPLASTY Left 04/15/2019   Procedure: LEFT TOTAL KNEE ARTHROPLASTY;  Surgeon: Melrose Nakayama, MD;  Location: [facility];  Service: Orthopedics;  Laterality: Left;   TUBAL LIGATION     WISDOM TOOTH EXTRACTION     Patient Active Problem List   Diagnosis Date Noted   Preventive measure 11/17/2019   Cancer associated pain 11/03/2019   Class II obesity 05/26/2019   Primary osteoarthritis of left knee 04/15/2019   Inflammatory arthritis 03/14/2018   Neuropathy 01/16/2018   Vitamin D deficiency 11/11/2017   Hypothyroid 11/09/2017   Total body pain 11/09/2017   Sinusitis 10/11/2017   Mediastinal lymphadenopathy 10/11/2017   Skin rash 10/11/2017   Genetic testing 08/29/2017   Hypothyroidism 08/08/2017   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[place]) 07/30/2017   Depression 07/30/2017   Encounter for antineoplastic immunotherapy 07/30/2017   Metastasis to lung ([place]) 07/30/2017   Groin pain, right 07/30/2017   Family history of uterine cancer    Left groin pain    Endometrial cancer ([place])     PCP: Charlane Ferretti, MD   REFERRING PROVIDER: Lafonda Mosses, MD   REFERRING DIAG: R10.2 (ICD-10-CM) - Vaginal pain   THERAPY DIAG:  Other muscle spasm  Muscle weakness (generalized)  Abnormal posture  Rationale for Evaluation and Treatment: Rehabilitation  ONSET DATE: at 6 months ago  SUBJECTIVE:                                                                                                                                                                                           SUBJECTIVE STATEMENT: I went to an orthopedic yesterday and he is going to do an injection in the back.  Yesterday was a bad day but the pain was better after doing the exercises.  Today it is not bothering me.  It hurt for a little while after last time but then after  exercises things calmed down. Fluid intake: Yes: 4 x 16 oz water and coffee in the morning    PAIN:  PAIN:  Are you having pain? No   PRECAUTIONS: None  WEIGHT BEARING RESTRICTIONS: No  FALLS:  Has patient fallen in last 6 months? No  LIVING ENVIRONMENT: Lives with: lives alone Lives in: House/apartment   OCCUPATION: retired  PLOF: Independent  PATIENT GOALS: not having pain  PERTINENT HISTORY:  Lt TKA, complete hysterectomy, endometrial cancer, pelvic radiation therapy 2018 Sexual abuse: No  BOWEL MOVEMENT: Pain with bowel movement: Yes Type of bowel movement:Type (Bristol Stool Scale) normal, regular, does strain Fully empty rectum: Yes:   Leakage: No Pads: No Fiber supplement:   URINATION: Pain with urination: No Fully empty bladder: Yes:   Stream: Strong Urgency: Yes:   Frequency: every 2 hours Leakage: Urge to void and Walking to the bathroom Pads: Yes: only if I go out  INTERCOURSE: Pain with intercourse: not sexually active   PREGNANCY: Vaginal deliveries 1 Tearing  C-section deliveries 1 Currently pregnant  Miscarriage : 1  PROLAPSE: None   OBJECTIVE:   DIAGNOSTIC FINDINGS:  MRI spine:  chronic straightening of lumbar lordosis, mild retrolisthesis of L4 on L5 and anterolisthesis L5 on S1 stable from one year ago    No lower cord root abnormalities  L3-4 mild bil bulge and foraminal sprurring  L4-5 mild facet hypertroph and bil disc bulge and foraminal stenosis  L5-S1 mild anterolisthesis  PATIENT SURVEYS:    PFIQ-7   COGNITION: Overall cognitive status: Within functional limits for tasks assessed     SENSATION: Light touch:  Proprioception:   MUSCLE LENGTH: Hamstrings: 75%; Left + pain in knee and back but not pain that is consistent with nerve impingement Thomas test:  LUMBAR SPECIAL TESTS:  Straight leg raise test: Negative  FUNCTIONAL TESTS:  Single leg - needs UE assistance - trendelenburg  bil  GAIT:  Comments: [facility]  POSTURE: increased lumbar lordosis, increased thoracic kyphosis, anterior pelvic tilt, and weight shift right  PELVIC ALIGNMENT:  LUMBARAROM/PROM:  A/PROM A/PROM  eval  Flexion WFL +pain coming back to standing  Extension   Right lateral flexion   Left lateral flexion   Right rotation   Left rotation    (Blank rows = not tested)  LOWER EXTREMITY ROM:  Passive ROM Right eval Left eval  Hip flexion 80% 75% +pain  Hip extension    Hip abduction    Hip adduction    Hip internal rotation    Hip external rotation  75% +pain  Knee flexion    Knee extension    Ankle dorsiflexion    Ankle plantarflexion    Ankle inversion    Ankle eversion     (Blank rows = not tested)  LOWER EXTREMITY MMT: MMT 0-5 scale MMT Right eval Left eval  Hip flexion    Hip extension 5 4/5  Hip abduction 5 4/5  Hip adduction 5 5  Hip internal rotation    Hip external rotation    Knee flexion    Knee extension    Ankle dorsiflexion    Ankle plantarflexion    Ankle inversion    Ankle eversion     PALPATION:   General  lumbar tight, right groin TTP                External Perineal Exam TTP Rt transverse peroneus and ischial cavernosis                             Internal Pelvic Floor superficial and pressing into levators bil very TTP unable to palpate further, no increased tenderness to bladder, urethra lifts with kegel but no circular contraction  Patient confirms identification and approves PT to assess internal pelvic floor and treatment Yes No emotional/communication barriers or cognitive limitation. Patient is motivated to learn. Patient understands and agrees with treatment goals and plan. PT explains patient will be examined in standing, sitting, and lying down to see how their muscles and joints work. When they are ready, they will be asked to remove their underwear so PT can examine their perineum. The patient is also given the option of providing  their own chaperone as one is not provided in our facility. The patient also has the right and is explained the right to defer or refuse any part of the evaluation or treatment including the internal exam. With the patient's consent, PT will use one gloved finger to gently assess the muscles of the pelvic floor, seeing how well it contracts and relaxes and if there is muscle symmetry. After, the patient will get dressed and PT and patient will discuss exam findings and plan of care. PT and patient discuss plan of care, schedule, attendance policy and HEP activities.   PELVIC MMT:   MMT eval  Vaginal 1-2/5, 1 rep with pain and needed cues to get to 2/5  Internal Anal Sphincter   External Anal Sphincter   Puborectalis   Diastasis Recti   (Blank rows = not tested)        TONE: high  PROLAPSE: no  TODAY'S TREATMENT:  DATE: 04/25/22               Exercises and coordination with breathing: Arm curl with yellow ball sitting with pillow for back support - 20x Horizontal abduction green band - 20x Fwd flexion in sitting - 5 x 5 sec hold and felt more pressure on the Rt side and going up the back Nustep - L5 seat 3 UE 5 - 5 min Hip flexion on ste H/s stretch   PATIENT EDUCATION:  Education details: Access Code: [email or web address] K Person educated: Patient Education method: Explanation, Demonstration, Tactile cues, Verbal cues, and Handouts Education comprehension: verbalized understanding and returned demonstration  HOME EXERCISE PROGRAM: Access Code: [email or web address] K URL: [email or web address] Date: 04/18/2022 Prepared by: Jari Favre  Exercises - Supported Butterfly Stretch with Pelvic Floor Relaxation  - 1 x daily - 7 x weekly - 3 sets - 10 reps - Diaphragmatic Breathing at 90/90 Supported  - 1 x daily - 7 x weekly - 3 sets - 10  reps  ASSESSMENT:  CLINICAL IMPRESSION: Patient felt more sore after last session but exercises have been helping.  Pt working on more strengthening and AROM today as she is responding better to active ways to relax muscles.  Pt will benefit from skilled PT to continue working on core strength and coordination with breathing for pain management.  OBJECTIVE IMPAIRMENTS: decreased coordination, decreased endurance, decreased ROM, decreased strength, increased muscle spasms, impaired flexibility, impaired tone, postural dysfunction, and pain.   ACTIVITY LIMITATIONS: lifting, bending, sitting, and standing  PARTICIPATION LIMITATIONS: meal prep, cleaning, interpersonal relationship, and community activity  PERSONAL FACTORS: Time since onset of injury/illness/exacerbation and 1-2 comorbidities: history of cancer and radiation treatment, knee replacement Lt  are also affecting patient's functional outcome.   REHAB POTENTIAL: Good  CLINICAL DECISION MAKING: Evolving/moderate complexity  EVALUATION COMPLEXITY: Moderate   GOALS: Goals reviewed with patient? Yes  SHORT TERM GOALS: Target date: 05/15/22  Ind with initial HEP Baseline: Goal status: MET  2.  Pt reports 20% less low back pain during typical daily activities Baseline:  Goal status: IN PROGRESS   LONG TERM GOALS: Target date: 07/10/22  Ind with toileting techniques Baseline:  Goal status: INITIAL  2.  Pt will be able to have bowel movement without increased pain Baseline:  Goal status: INITIAL  3.  Pt will have at least 50% less lumbar pain with daily activities Baseline:  Goal status: INITIAL  4.  Pt will be independent with advanced HEP to maintain improvements made throughout therapy  Baseline:  Goal status: INITIAL  5.  Pt will be able to stand for at least 30 minutes without increased pain for daily activities Baseline:  Goal status: INITIAL   PLAN:  PT FREQUENCY: 1x/week  PT DURATION: 12  weeks  PLANNED INTERVENTIONS: Therapeutic exercises, Therapeutic activity, Neuromuscular re-education, Balance training, Gait training, Patient/Family education, Self Care, Joint mobilization, Aquatic Therapy, Dry Needling, Electrical stimulation, Cryotherapy, Moist heat, Taping, Biofeedback, Manual therapy, and Re-evaluation  PLAN FOR NEXT SESSION: back and hip flexibility and A/PROM, activate transversus abdominus, does not want dry needling    Camillo Flaming Alleen Kehm, PT 04/25/2022, 10:25 AM

## 2022-05-10 ENCOUNTER — Ambulatory Visit: Payer: Medicare Other | Attending: Gynecologic Oncology | Admitting: Physical Therapy

## 2022-05-10 DIAGNOSIS — R293 Abnormal posture: Secondary | ICD-10-CM | POA: Diagnosis not present

## 2022-05-10 DIAGNOSIS — M6281 Muscle weakness (generalized): Secondary | ICD-10-CM | POA: Diagnosis not present

## 2022-05-10 DIAGNOSIS — M62838 Other muscle spasm: Secondary | ICD-10-CM | POA: Insufficient documentation

## 2022-05-10 NOTE — Therapy (Signed)
OUTPATIENT PHYSICAL THERAPY FEMALE PELVIC TREATMENT   Patient Name: Ashley Villegas MRN: [id number] DOB:[birth year], 72 y.o., female Today's Date: 05/10/2022  END OF SESSION:  PT End of Session - 05/10/22 0943     Visit Number 4    Date for PT Re-Evaluation 07/10/22    Authorization Type Medicare    PT Start Time 0929    PT Stop Time 1008    PT Time Calculation (min) 39 min    Activity Tolerance Patient tolerated treatment well    Behavior During Therapy [facility] for tasks assessed/performed                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, recurrent    Anxiety    Arthritis    Carpal tunnel syndrome, bilateral upper limbs    Complication of anesthesia    Depression    Family history of uterine cancer    GERD (gastroesophageal reflux disease)    History of radiation therapy 11/08/2016-12/27/2016   pelvis 45 Gy in 25 fractions, pelvis boost 14 gy in 7 fractions   History of radiation therapy    endometrial - pelvis IMRT 11/10/2019-11/27/2019   Hypothyroidism    on synthroid    PONV (postoperative nausea and vomiting)    Past Surgical History:  Procedure Laterality Date   CARPAL TUNNEL RELEASE Bilateral 2020   CESAREAN SECTION     COLONOSCOPY     DIAGNOSTIC LAPAROSCOPY     fibroid tumors on her ovaries    DILATION AND CURETTAGE OF UTERUS     HAND SURGERY Right 2019   carpal tunnel   HYSTEROSCOPY WITH D & C N/A 09/15/2014   Procedure: DILATATION AND CURETTAGE /HYSTEROSCOPY/MYOSURE RESECTION OF POLYP;  Surgeon: Janyth Pupa, DO;  Location: [facility];  Service: Gynecology;  Laterality: N/A;   KNEE SURGERY     LYMPH NODE DISSECTION N/A 10/26/2014   Procedure: POSS LYMPHADENECTOMY ;  Surgeon: [name], MD;  Location: [facility];  Service: Gynecology;  Laterality: N/A;   ROBOTIC ASSISTED TOTAL HYSTERECTOMY WITH BILATERAL SALPINGO OOPHERECTOMY Bilateral 10/26/2014   Procedure: ROBOTIC ASSISTED TOTAL  HYSTERECTOMY WITH BILATERAL SALPINGO OOPHORECTOMY;  Surgeon: [name], MD;  Location: [facility];  Service: Gynecology;  Laterality: Bilateral;   TOTAL KNEE ARTHROPLASTY Left 04/15/2019   Procedure: LEFT TOTAL KNEE ARTHROPLASTY;  Surgeon: Melrose Nakayama, MD;  Location: [facility];  Service: Orthopedics;  Laterality: Left;   TUBAL LIGATION     WISDOM TOOTH EXTRACTION     Patient Active Problem List   Diagnosis Date Noted   Preventive measure 11/17/2019   Cancer associated pain 11/03/2019   Class II obesity 05/26/2019   Primary osteoarthritis of left knee 04/15/2019   Inflammatory arthritis 03/14/2018   Neuropathy 01/16/2018   Vitamin D deficiency 11/11/2017   Hypothyroid 11/09/2017   Total body pain 11/09/2017   Sinusitis 10/11/2017   Mediastinal lymphadenopathy 10/11/2017   Skin rash 10/11/2017   Genetic testing 08/29/2017   Hypothyroidism 08/08/2017   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[place]) 07/30/2017   Depression 07/30/2017   Encounter for antineoplastic immunotherapy 07/30/2017   Metastasis to lung ([place]) 07/30/2017   Groin pain, right 07/30/2017   Family history of uterine cancer    Left groin pain    Endometrial cancer ([place])     PCP: Charlane Ferretti, MD   REFERRING PROVIDER: Lafonda Mosses, MD   REFERRING DIAG: R10.2 (ICD-10-CM) - Vaginal pain   THERAPY  DIAG:  Other muscle spasm  Muscle weakness (generalized)  Abnormal posture  Rationale for Evaluation and Treatment: Rehabilitation  ONSET DATE: at 6 months ago  SUBJECTIVE:                                                                                                                                                                                           SUBJECTIVE STATEMENT: My back is in agony today.  I feel better after doing the exercises for a little while.  I am getting an injection in my back next week on Thursday.  Pain with bowel movement is getting better. Fluid intake: Yes: 4 x 16 oz water and coffee  in the morning    PAIN:  PAIN:  Are you having pain? Yes NPRS scale: 7/10 Pain location: low back and knee is 10/10 Pain orientation: Left  PAIN TYPE: aching Pain description: intermittent  Aggravating factors: rainy weather, standing still >5 min Relieving factors: lying down   PRECAUTIONS: None  WEIGHT BEARING RESTRICTIONS: No  FALLS:  Has patient fallen in last 6 months? No  LIVING ENVIRONMENT: Lives with: lives alone Lives in: House/apartment   OCCUPATION: retired  PLOF: Independent  PATIENT GOALS: not having pain  PERTINENT HISTORY:  Lt TKA, complete hysterectomy, endometrial cancer, pelvic radiation therapy 2018 Sexual abuse: No  BOWEL MOVEMENT: Pain with bowel movement: Yes Type of bowel movement:Type (Bristol Stool Scale) normal, regular, does strain Fully empty rectum: Yes:   Leakage: No Pads: No Fiber supplement:   URINATION: Pain with urination: No Fully empty bladder: Yes:   Stream: Strong Urgency: Yes:   Frequency: every 2 hours Leakage: Urge to void and Walking to the bathroom Pads: Yes: only if I go out  INTERCOURSE: Pain with intercourse: not sexually active   PREGNANCY: Vaginal deliveries 1 Tearing  C-section deliveries 1 Currently pregnant  Miscarriage : 1  PROLAPSE: None   OBJECTIVE:   DIAGNOSTIC FINDINGS:  MRI spine:  chronic straightening of lumbar lordosis, mild retrolisthesis of L4 on L5 and anterolisthesis L5 on S1 stable from one year ago    No lower cord root abnormalities  L3-4 mild bil bulge and foraminal sprurring  L4-5 mild facet hypertroph and bil disc bulge and foraminal stenosis  L5-S1 mild anterolisthesis  PATIENT SURVEYS:    PFIQ-7   COGNITION: Overall cognitive status: Within functional limits for tasks assessed     SENSATION: Light touch:  Proprioception:   MUSCLE LENGTH: Hamstrings: 75%; Left + pain in knee and back but not pain that is consistent with nerve impingement Thomas  test:  LUMBAR SPECIAL TESTS:  Straight leg raise test: Negative  FUNCTIONAL TESTS:  Single leg - needs UE assistance - trendelenburg bil  GAIT:  Comments: WFL   POSTURE: increased lumbar lordosis, increased thoracic kyphosis, anterior pelvic tilt, and weight shift right  PELVIC ALIGNMENT:  LUMBARAROM/PROM:  A/PROM A/PROM  eval  Flexion WFL +pain coming back to standing  Extension   Right lateral flexion   Left lateral flexion   Right rotation   Left rotation    (Blank rows = not tested)  LOWER EXTREMITY ROM:  Passive ROM Right eval Left eval  Hip flexion 80% 75% +pain  Hip extension    Hip abduction    Hip adduction    Hip internal rotation    Hip external rotation  75% +pain  Knee flexion    Knee extension    Ankle dorsiflexion    Ankle plantarflexion    Ankle inversion    Ankle eversion     (Blank rows = not tested)  LOWER EXTREMITY MMT: MMT 0-5 scale MMT Right eval Left eval  Hip flexion    Hip extension 5 4/5  Hip abduction 5 4/5  Hip adduction 5 5  Hip internal rotation    Hip external rotation    Knee flexion    Knee extension    Ankle dorsiflexion    Ankle plantarflexion    Ankle inversion    Ankle eversion     PALPATION:   General  lumbar tight, right groin TTP                External Perineal Exam TTP Rt transverse peroneus and ischial cavernosis                             Internal Pelvic Floor superficial and pressing into levators bil very TTP unable to palpate further, no increased tenderness to bladder, urethra lifts with kegel but no circular contraction  Patient confirms identification and approves PT to assess internal pelvic floor and treatment Yes No emotional/communication barriers or cognitive limitation. Patient is motivated to learn. Patient understands and agrees with treatment goals and plan. PT explains patient will be examined in standing, sitting, and lying down to see how their muscles and joints work. When they are  ready, they will be asked to remove their underwear so PT can examine their perineum. The patient is also given the option of providing their own chaperone as one is not provided in our facility. The patient also has the right and is explained the right to defer or refuse any part of the evaluation or treatment including the internal exam. With the patient's consent, PT will use one gloved finger to gently assess the muscles of the pelvic floor, seeing how well it contracts and relaxes and if there is muscle symmetry. After, the patient will get dressed and PT and patient will discuss exam findings and plan of care. PT and patient discuss plan of care, schedule, attendance policy and HEP activities.   PELVIC MMT:   MMT eval  Vaginal 1-2/5, 1 rep with pain and needed cues to get to 2/5  Internal Anal Sphincter   External Anal Sphincter   Puborectalis   Diastasis Recti   (Blank rows = not tested)        TONE: high  PROLAPSE: no  TODAY'S TREATMENT:  DATE: 05/10/22               Exercises and coordination with breathing: Supine band march Supine band hip abduction    Supine pball overhead    Supine opposite UE /LE ball presses (some increased pain in Rt side when pressing on Rt LE) Nustep(blue) - L2 seat 3 UE 5 - 5 min Heel slide in supine    PATIENT EDUCATION:  Education details: Access Code: [email or web address] Person educated: Patient Education method: Explanation, Demonstration, Tactile cues, Verbal cues, and Handouts Education comprehension: verbalized understanding and returned demonstration  HOME EXERCISE PROGRAM: Access Code: [email or web address] K URL: [email or web address] Date: 05/10/2022 Prepared by: Jari Favre  Exercises - Supported Butterfly Stretch with Pelvic Floor Relaxation  - 1 x daily - 7 x weekly - 3 sets - 10 reps - Diaphragmatic  Breathing at 90/90 Supported  - 1 x daily - 7 x weekly - 3 sets - 10 reps - Supine Posterior Pelvic Tilt  - 1 x daily - 7 x weekly - 3 sets - 10 reps - Supine Hip Internal and External Rotation  - 1 x daily - 7 x weekly - 3 sets - 10 reps - Seated Shoulder Flexion with Dumbbells  - 1 x daily - 7 x weekly - 3 sets - 10 reps - Supine March with Resistance Band  - 1 x daily - 7 x weekly - 3 sets - 10 reps - Supine 90/90 Shoulder Flexion with Abdominal Bracing  - 1 x daily - 7 x weekly - 3 sets - 10 reps  Patient Education - Trigger Point Dry Needling  ASSESSMENT:  CLINICAL IMPRESSION: Patient reports less pain with bowel movements.  Pt able to do exercises and having less pain during exercises with improved techniques.  She still needs cues for breathing and head elevated for improved neutral spine.  Pt will benefit from skilled PT to continue working on core strength and coordination with breathing for pain management.  OBJECTIVE IMPAIRMENTS: decreased coordination, decreased endurance, decreased ROM, decreased strength, increased muscle spasms, impaired flexibility, impaired tone, postural dysfunction, and pain.   ACTIVITY LIMITATIONS: lifting, bending, sitting, and standing  PARTICIPATION LIMITATIONS: meal prep, cleaning, interpersonal relationship, and community activity  PERSONAL FACTORS: Time since onset of injury/illness/exacerbation and 1-2 comorbidities: history of cancer and radiation treatment, knee replacement Lt  are also affecting patient's functional outcome.   REHAB POTENTIAL: Good  CLINICAL DECISION MAKING: Evolving/moderate complexity  EVALUATION COMPLEXITY: Moderate   GOALS: Goals reviewed with patient? Yes  SHORT TERM GOALS: Target date: 05/15/22  Ind with initial HEP Baseline: Goal status: MET  2.  Pt reports 20% less low back pain during typical daily activities Baseline: better after exercises Goal status: IN PROGRESS   LONG TERM GOALS: Target date:  07/10/22  Ind with toileting techniques Baseline:  Goal status: MET  2.  Pt will be able to have bowel movement without increased pain Baseline:  Goal status: IN PROGRESS  3.  Pt will have at least 50% less lumbar pain with daily activities Baseline:  Goal status: IN PROGRESS  4.  Pt will be independent with advanced HEP to maintain improvements made throughout therapy  Baseline:  Goal status: IN PROGRESS  5.  Pt will be able to stand for at least 30 minutes without increased pain for daily activities Baseline:  Goal status: IN PROGRESS   PLAN:  PT FREQUENCY: 1x/week  PT DURATION: 12 weeks  PLANNED INTERVENTIONS: Therapeutic exercises, Therapeutic activity, Neuromuscular re-education, Balance training, Gait  training, Patient/Family education, Self Care, Joint mobilization, Aquatic Therapy, Dry Needling, Electrical stimulation, Cryotherapy, Moist heat, Taping, Biofeedback, Manual therapy, and Re-evaluation  PLAN FOR NEXT SESSION: progress core strength, nustep 6-8 min    Camillo Flaming Lylla Eifler, PT 05/10/2022, 9:44 AM

## 2022-05-14 ENCOUNTER — Ambulatory Visit: Payer: Medicare Other | Admitting: Physical Therapy

## 2022-05-14 DIAGNOSIS — M62838 Other muscle spasm: Secondary | ICD-10-CM | POA: Diagnosis not present

## 2022-05-14 DIAGNOSIS — M6281 Muscle weakness (generalized): Secondary | ICD-10-CM

## 2022-05-14 DIAGNOSIS — R293 Abnormal posture: Secondary | ICD-10-CM

## 2022-05-14 NOTE — Therapy (Signed)
OUTPATIENT PHYSICAL THERAPY FEMALE PELVIC TREATMENT   Patient Name: Ashley Villegas MRN: [id number] DOB:23-Nov-1950, 72 y.o., female Today's Date: 05/14/2022  END OF SESSION:  PT End of Session - 05/14/22 0802     Visit Number 5    Date for PT Re-Evaluation 07/10/22    Authorization Type Medicare    PT Start Time 0802    PT Stop Time 0846    PT Time Calculation (min) 44 min    Activity Tolerance Patient tolerated treatment well    Behavior During Therapy [facility] for tasks assessed/performed                 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, recurrent    Anxiety    Arthritis    Carpal tunnel syndrome, bilateral upper limbs    Complication of anesthesia    Depression    Family history of uterine cancer    GERD (gastroesophageal reflux disease)    History of radiation therapy 11/08/2016-12/27/2016   pelvis 45 Gy in 25 fractions, pelvis boost 14 gy in 7 fractions   History of radiation therapy    endometrial - pelvis IMRT 11/10/2019-11/27/2019   Hypothyroidism    on synthroid    PONV (postoperative nausea and vomiting)    Past Surgical History:  Procedure Laterality Date   CARPAL TUNNEL RELEASE Bilateral 2020   CESAREAN SECTION     COLONOSCOPY     DIAGNOSTIC LAPAROSCOPY     fibroid tumors on her ovaries    DILATION AND CURETTAGE OF UTERUS     HAND SURGERY Right 2019   carpal tunnel   HYSTEROSCOPY WITH D & C N/A 09/15/2014   Procedure: DILATATION AND CURETTAGE /HYSTEROSCOPY/MYOSURE RESECTION OF POLYP;  Surgeon: Janyth Pupa, DO;  Location: [facility];  Service: Gynecology;  Laterality: N/A;   KNEE SURGERY     LYMPH NODE DISSECTION N/A 10/26/2014   Procedure: POSS LYMPHADENECTOMY ;  Surgeon: [name], MD;  Location: [facility];  Service: Gynecology;  Laterality: N/A;   ROBOTIC ASSISTED TOTAL HYSTERECTOMY WITH BILATERAL SALPINGO OOPHERECTOMY Bilateral 10/26/2014   Procedure: ROBOTIC ASSISTED TOTAL  HYSTERECTOMY WITH BILATERAL SALPINGO OOPHORECTOMY;  Surgeon: [name], MD;  Location: [facility];  Service: Gynecology;  Laterality: Bilateral;   TOTAL KNEE ARTHROPLASTY Left 04/15/2019   Procedure: LEFT TOTAL KNEE ARTHROPLASTY;  Surgeon: Melrose Nakayama, MD;  Location: [facility];  Service: Orthopedics;  Laterality: Left;   TUBAL LIGATION     WISDOM TOOTH EXTRACTION     Patient Active Problem List   Diagnosis Date Noted   Preventive measure 11/17/2019   Cancer associated pain 11/03/2019   Class II obesity 05/26/2019   Primary osteoarthritis of left knee 04/15/2019   Inflammatory arthritis 03/14/2018   Neuropathy 01/16/2018   Vitamin D deficiency 11/11/2017   Hypothyroid 11/09/2017   Total body pain 11/09/2017   Sinusitis 10/11/2017   Mediastinal lymphadenopathy 10/11/2017   Skin rash 10/11/2017   Genetic testing 08/29/2017   Hypothyroidism 08/08/2017   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[place]) 07/30/2017   Depression 07/30/2017   Encounter for antineoplastic immunotherapy 07/30/2017   Metastasis to lung ([place]) 07/30/2017   Groin pain, right 07/30/2017   Family history of uterine cancer    Left groin pain    Endometrial cancer ([place])     PCP: Charlane Ferretti, MD   REFERRING PROVIDER: Lafonda Mosses, MD   REFERRING DIAG: R10.2 (ICD-10-CM) - Vaginal pain  THERAPY DIAG:  Other muscle spasm  Muscle weakness (generalized)  Abnormal posture  Rationale for Evaluation and Treatment: Rehabilitation  ONSET DATE: at 6 months ago  SUBJECTIVE:                                                                                                                                                                                           SUBJECTIVE STATEMENT: I feel a lot better than I did Friday  Not feeling any back pain today yet. Fluid intake: Yes: 4 x 16 oz water and coffee in the morning    PAIN:  PAIN:  Are you having pain? No    PRECAUTIONS: None  WEIGHT BEARING RESTRICTIONS:  No  FALLS:  Has patient fallen in last 6 months? No  LIVING ENVIRONMENT: Lives with: lives alone Lives in: House/apartment   OCCUPATION: retired  PLOF: Independent  PATIENT GOALS: not having pain  PERTINENT HISTORY:  Lt TKA, complete hysterectomy, endometrial cancer, pelvic radiation therapy 2018 Sexual abuse: No  BOWEL MOVEMENT: Pain with bowel movement: Yes Type of bowel movement:Type (Bristol Stool Scale) normal, regular, does strain Fully empty rectum: Yes:   Leakage: No Pads: No Fiber supplement:   URINATION: Pain with urination: No Fully empty bladder: Yes:   Stream: Strong Urgency: Yes:   Frequency: every 2 hours Leakage: Urge to void and Walking to the bathroom Pads: Yes: only if I go out  INTERCOURSE: Pain with intercourse: not sexually active   PREGNANCY: Vaginal deliveries 1 Tearing  C-section deliveries 1 Currently pregnant  Miscarriage : 1  PROLAPSE: None   OBJECTIVE:   DIAGNOSTIC FINDINGS:  MRI spine:  chronic straightening of lumbar lordosis, mild retrolisthesis of L4 on L5 and anterolisthesis L5 on S1 stable from one year ago    No lower cord root abnormalities  L3-4 mild bil bulge and foraminal sprurring  L4-5 mild facet hypertroph and bil disc bulge and foraminal stenosis  L5-S1 mild anterolisthesis  PATIENT SURVEYS:    PFIQ-7   COGNITION: Overall cognitive status: Within functional limits for tasks assessed     SENSATION: Light touch:  Proprioception:   MUSCLE LENGTH: Hamstrings: 75%; Left + pain in knee and back but not pain that is consistent with nerve impingement Thomas test:  LUMBAR SPECIAL TESTS:  Straight leg raise test: Negative  FUNCTIONAL TESTS:  Single leg - needs UE assistance - trendelenburg bil  GAIT:  Comments: WFL   POSTURE: increased lumbar lordosis, increased thoracic kyphosis, anterior pelvic tilt, and weight shift right  PELVIC ALIGNMENT:  LUMBARAROM/PROM:  A/PROM A/PROM  eval   Flexion WFL +pain coming back to standing  Extension  Right lateral flexion   Left lateral flexion   Right rotation   Left rotation    (Blank rows = not tested)  LOWER EXTREMITY ROM:  Passive ROM Right eval Left eval  Hip flexion 80% 75% +pain  Hip extension    Hip abduction    Hip adduction    Hip internal rotation    Hip external rotation  75% +pain  Knee flexion    Knee extension    Ankle dorsiflexion    Ankle plantarflexion    Ankle inversion    Ankle eversion     (Blank rows = not tested)  LOWER EXTREMITY MMT: MMT 0-5 scale MMT Right eval Left eval  Hip flexion    Hip extension 5 4/5  Hip abduction 5 4/5  Hip adduction 5 5  Hip internal rotation    Hip external rotation    Knee flexion    Knee extension    Ankle dorsiflexion    Ankle plantarflexion    Ankle inversion    Ankle eversion     PALPATION:   General  lumbar tight, right groin TTP                External Perineal Exam TTP Rt transverse peroneus and ischial cavernosis                             Internal Pelvic Floor superficial and pressing into levators bil very TTP unable to palpate further, no increased tenderness to bladder, urethra lifts with kegel but no circular contraction  Patient confirms identification and approves PT to assess internal pelvic floor and treatment Yes No emotional/communication barriers or cognitive limitation. Patient is motivated to learn. Patient understands and agrees with treatment goals and plan. PT explains patient will be examined in standing, sitting, and lying down to see how their muscles and joints work. When they are ready, they will be asked to remove their underwear so PT can examine their perineum. The patient is also given the option of providing their own chaperone as one is not provided in our facility. The patient also has the right and is explained the right to defer or refuse any part of the evaluation or treatment including the internal exam. With  the patient's consent, PT will use one gloved finger to gently assess the muscles of the pelvic floor, seeing how well it contracts and relaxes and if there is muscle symmetry. After, the patient will get dressed and PT and patient will discuss exam findings and plan of care. PT and patient discuss plan of care, schedule, attendance policy and HEP activities.   PELVIC MMT:   MMT eval  Vaginal 1-2/5, 1 rep with pain and needed cues to get to 2/5  Internal Anal Sphincter   External Anal Sphincter   Puborectalis   Diastasis Recti   (Blank rows = not tested)        TONE: high  PROLAPSE: no  TODAY'S TREATMENT:  DATE: 05/14/22               Exercises and coordination with breathing: Supine band march - green band - 20x Supine band hip abduction - green band - 20x    Supine pball overhead - 15x    Supine pball roll out - 15x    Supine horizontal abduction in hook lying with 3 lb weights Nustep(blue) - L2 seat 4 UE 6 - 8 min Heel slide in supine 10x each side    PATIENT EDUCATION:  Education details: Access Code: [email or web address] Person educated: Patient Education method: Explanation, Demonstration, Tactile cues, Verbal cues, and Handouts Education comprehension: verbalized understanding and returned demonstration  HOME EXERCISE PROGRAM: Access Code: [email or web address] K URL: [email or web address] Date: 05/10/2022 Prepared by: Jari Favre  Exercises - Supported Butterfly Stretch with Pelvic Floor Relaxation  - 1 x daily - 7 x weekly - 3 sets - 10 reps - Diaphragmatic Breathing at 90/90 Supported  - 1 x daily - 7 x weekly - 3 sets - 10 reps - Supine Posterior Pelvic Tilt  - 1 x daily - 7 x weekly - 3 sets - 10 reps - Supine Hip Internal and External Rotation  - 1 x daily - 7 x weekly - 3 sets - 10 reps - Seated Shoulder Flexion with Dumbbells  - 1 x  daily - 7 x weekly - 3 sets - 10 reps - Supine March with Resistance Band  - 1 x daily - 7 x weekly - 3 sets - 10 reps - Supine 90/90 Shoulder Flexion with Abdominal Bracing  - 1 x daily - 7 x weekly - 3 sets - 10 reps  Patient Education - Trigger Point Dry Needling  ASSESSMENT:  CLINICAL IMPRESSION: Today's session focused on core strength and was able to progress.  Pt needing less cues to exhale on exertion today and no pain during exercises.  Pt is getting an injection this week and will benefit from skilled PT to further progress exercises for maximum success with outcomes of pain management.  Overall, pt having much less pain with bowel movement and less back pain this week.  Recommended to continue per POC and pt is expected to make progress.  OBJECTIVE IMPAIRMENTS: decreased coordination, decreased endurance, decreased ROM, decreased strength, increased muscle spasms, impaired flexibility, impaired tone, postural dysfunction, and pain.   ACTIVITY LIMITATIONS: lifting, bending, sitting, and standing  PARTICIPATION LIMITATIONS: meal prep, cleaning, interpersonal relationship, and community activity  PERSONAL FACTORS: Time since onset of injury/illness/exacerbation and 1-2 comorbidities: history of cancer and radiation treatment, knee replacement Lt  are also affecting patient's functional outcome.   REHAB POTENTIAL: Good  CLINICAL DECISION MAKING: Evolving/moderate complexity  EVALUATION COMPLEXITY: Moderate   GOALS: Goals reviewed with patient? Yes  SHORT TERM GOALS: Target date: 05/15/22  Ind with initial HEP Baseline: Goal status: MET  2.  Pt reports 20% less low back pain during typical daily activities Baseline: better after exercises Goal status: IN PROGRESS   LONG TERM GOALS: Target date: 07/10/22 Updated 05/14/22  Ind with toileting techniques Baseline:  Goal status: MET  2.  Pt will be able to have bowel movement without increased pain Baseline: only pain  once in a while, 2-3/10 when has pain improved from every time and was 9-10/10 initially Goal status: IN PROGRESS  3.  Pt will have at least 50% less lumbar pain with daily activities Baseline:  Goal status: IN PROGRESS  4.  Pt will be independent with advanced HEP to maintain  improvements made throughout therapy  Baseline:  Goal status: IN PROGRESS  5.  Pt will be able to stand for at least 30 minutes without increased pain for daily activities Baseline: 10-15 min Goal status: IN PROGRESS   PLAN:  PT FREQUENCY: 1x/week  PT DURATION: 12 weeks  PLANNED INTERVENTIONS: Therapeutic exercises, Therapeutic activity, Neuromuscular re-education, Balance training, Gait training, Patient/Family education, Self Care, Joint mobilization, Aquatic Therapy, Dry Needling, Electrical stimulation, Cryotherapy, Moist heat, Taping, Biofeedback, Manual therapy, and Re-evaluation  PLAN FOR NEXT SESSION: progress core strength, nustep 8 min    Jakki L Amiyrah Lamere, PT 05/14/2022, 8:06 AM

## 2022-05-17 DIAGNOSIS — M47816 Spondylosis without myelopathy or radiculopathy, lumbar region: Secondary | ICD-10-CM | POA: Diagnosis not present

## 2022-05-17 DIAGNOSIS — M47817 Spondylosis without myelopathy or radiculopathy, lumbosacral region: Secondary | ICD-10-CM | POA: Diagnosis not present

## 2022-05-21 NOTE — Therapy (Unsigned)
OUTPATIENT PHYSICAL THERAPY FEMALE PELVIC TREATMENT   Patient Name: Ashley Villegas MRN: [id number] DOB:1951-01-08, 72 y.o., female Today's Date: 05/22/2022  END OF SESSION:  PT End of Session - 05/22/22 0928     Visit Number 6    Date for PT Re-Evaluation 07/10/22    Authorization Type Medicare    PT Start Time 0845    PT Stop Time 0924    PT Time Calculation (min) 39 min    Activity Tolerance Patient tolerated treatment well    Behavior During Therapy [facility] for tasks assessed/performed                  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, recurrent    Anxiety    Arthritis    Carpal tunnel syndrome, bilateral upper limbs    Complication of anesthesia    Depression    Family history of uterine cancer    GERD (gastroesophageal reflux disease)    History of radiation therapy 11/08/2016-12/27/2016   pelvis 45 Gy in 25 fractions, pelvis boost 14 gy in 7 fractions   History of radiation therapy    endometrial - pelvis IMRT 11/10/2019-11/27/2019   Hypothyroidism    on synthroid    PONV (postoperative nausea and vomiting)    Past Surgical History:  Procedure Laterality Date   CARPAL TUNNEL RELEASE Bilateral 2020   CESAREAN SECTION     COLONOSCOPY     DIAGNOSTIC LAPAROSCOPY     fibroid tumors on her ovaries    DILATION AND CURETTAGE OF UTERUS     HAND SURGERY Right 2019   carpal tunnel   HYSTEROSCOPY WITH D & C N/A 09/15/2014   Procedure: DILATATION AND CURETTAGE /HYSTEROSCOPY/MYOSURE RESECTION OF POLYP;  Surgeon: Janyth Pupa, DO;  Location: [facility];  Service: Gynecology;  Laterality: N/A;   KNEE SURGERY     LYMPH NODE DISSECTION N/A 10/26/2014   Procedure: POSS LYMPHADENECTOMY ;  Surgeon: [name], MD;  Location: [facility];  Service: Gynecology;  Laterality: N/A;   ROBOTIC ASSISTED TOTAL HYSTERECTOMY WITH BILATERAL SALPINGO OOPHERECTOMY Bilateral 10/26/2014   Procedure: ROBOTIC ASSISTED TOTAL  HYSTERECTOMY WITH BILATERAL SALPINGO OOPHORECTOMY;  Surgeon: [name], MD;  Location: [facility];  Service: Gynecology;  Laterality: Bilateral;   TOTAL KNEE ARTHROPLASTY Left 04/15/2019   Procedure: LEFT TOTAL KNEE ARTHROPLASTY;  Surgeon: Melrose Nakayama, MD;  Location: [facility];  Service: Orthopedics;  Laterality: Left;   TUBAL LIGATION     WISDOM TOOTH EXTRACTION     Patient Active Problem List   Diagnosis Date Noted   Preventive measure 11/17/2019   Cancer associated pain 11/03/2019   Class II obesity 05/26/2019   Primary osteoarthritis of left knee 04/15/2019   Inflammatory arthritis 03/14/2018   Neuropathy 01/16/2018   Vitamin D deficiency 11/11/2017   Hypothyroid 11/09/2017   Total body pain 11/09/2017   Sinusitis 10/11/2017   Mediastinal lymphadenopathy 10/11/2017   Skin rash 10/11/2017   Genetic testing 08/29/2017   Hypothyroidism 08/08/2017   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[place]) 07/30/2017   Depression 07/30/2017   Encounter for antineoplastic immunotherapy 07/30/2017   Metastasis to lung ([place]) 07/30/2017   Groin pain, right 07/30/2017   Family history of uterine cancer    Left groin pain    Endometrial cancer ([place])     PCP: Charlane Ferretti, MD   REFERRING PROVIDER: Lafonda Mosses, MD   REFERRING DIAG: R10.2 (ICD-10-CM) - Vaginal pain  THERAPY DIAG:  Other muscle spasm  Muscle weakness (generalized)  Abnormal posture  Rationale for Evaluation and Treatment: Rehabilitation  ONSET DATE: at 6 months ago  SUBJECTIVE:                                                                                                                                                                                           SUBJECTIVE STATEMENT: I can do so much and no pain.  I had the injections - 2 on each side.  I sat for a long time to watch my granddaughter cheerleading and no pain. Fluid intake: Yes: 4 x 16 oz water and coffee in the morning    PAIN:  PAIN:  Are you  having pain? No    PRECAUTIONS: None  WEIGHT BEARING RESTRICTIONS: No  FALLS:  Has patient fallen in last 6 months? No  LIVING ENVIRONMENT: Lives with: lives alone Lives in: House/apartment   OCCUPATION: retired  PLOF: Independent  PATIENT GOALS: not having pain  PERTINENT HISTORY:  Lt TKA, complete hysterectomy, endometrial cancer, pelvic radiation therapy 2018 Sexual abuse: No  BOWEL MOVEMENT: Pain with bowel movement: Yes Type of bowel movement:Type (Bristol Stool Scale) normal, regular, does strain Fully empty rectum: Yes:   Leakage: No Pads: No Fiber supplement:   URINATION: Pain with urination: No Fully empty bladder: Yes:   Stream: Strong Urgency: Yes:   Frequency: every 2 hours Leakage: Urge to void and Walking to the bathroom Pads: Yes: only if I go out  INTERCOURSE: Pain with intercourse: not sexually active   PREGNANCY: Vaginal deliveries 1 Tearing  C-section deliveries 1 Currently pregnant  Miscarriage : 1  PROLAPSE: None   OBJECTIVE:   DIAGNOSTIC FINDINGS:  MRI spine:  chronic straightening of lumbar lordosis, mild retrolisthesis of L4 on L5 and anterolisthesis L5 on S1 stable from one year ago    No lower cord root abnormalities  L3-4 mild bil bulge and foraminal sprurring  L4-5 mild facet hypertroph and bil disc bulge and foraminal stenosis  L5-S1 mild anterolisthesis  PATIENT SURVEYS:    PFIQ-7   COGNITION: Overall cognitive status: Within functional limits for tasks assessed     SENSATION: Light touch:  Proprioception:   MUSCLE LENGTH: Hamstrings: 75%; Left + pain in knee and back but not pain that is consistent with nerve impingement Thomas test:  LUMBAR SPECIAL TESTS:  Straight leg raise test: Negative  FUNCTIONAL TESTS:  Single leg - needs UE assistance - trendelenburg bil  GAIT:  Comments: WFL   POSTURE: increased lumbar lordosis, increased thoracic kyphosis, anterior pelvic tilt, and weight shift  right  PELVIC ALIGNMENT:  LUMBARAROM/PROM:  A/PROM A/PROM  eval  Flexion WFL +pain coming back to standing  Extension   Right lateral flexion   Left lateral flexion   Right rotation   Left rotation    (Blank rows = not tested)  LOWER EXTREMITY ROM:  Passive ROM Right eval Left eval  Hip flexion 80% 75% +pain  Hip extension    Hip abduction    Hip adduction    Hip internal rotation    Hip external rotation  75% +pain  Knee flexion    Knee extension    Ankle dorsiflexion    Ankle plantarflexion    Ankle inversion    Ankle eversion     (Blank rows = not tested)  LOWER EXTREMITY MMT: MMT 0-5 scale MMT Right eval Left eval  Hip flexion    Hip extension 5 4/5  Hip abduction 5 4/5  Hip adduction 5 5  Hip internal rotation    Hip external rotation    Knee flexion    Knee extension    Ankle dorsiflexion    Ankle plantarflexion    Ankle inversion    Ankle eversion     PALPATION:   General  lumbar tight, right groin TTP                External Perineal Exam TTP Rt transverse peroneus and ischial cavernosis                             Internal Pelvic Floor superficial and pressing into levators bil very TTP unable to palpate further, no increased tenderness to bladder, urethra lifts with kegel but no circular contraction  Patient confirms identification and approves PT to assess internal pelvic floor and treatment Yes No emotional/communication barriers or cognitive limitation. Patient is motivated to learn. Patient understands and agrees with treatment goals and plan. PT explains patient will be examined in standing, sitting, and lying down to see how their muscles and joints work. When they are ready, they will be asked to remove their underwear so PT can examine their perineum. The patient is also given the option of providing their own chaperone as one is not provided in our facility. The patient also has the right and is explained the right to defer or refuse any  part of the evaluation or treatment including the internal exam. With the patient's consent, PT will use one gloved finger to gently assess the muscles of the pelvic floor, seeing how well it contracts and relaxes and if there is muscle symmetry. After, the patient will get dressed and PT and patient will discuss exam findings and plan of care. PT and patient discuss plan of care, schedule, attendance policy and HEP activities.   PELVIC MMT:   MMT eval  Vaginal 1-2/5, 1 rep with pain and needed cues to get to 2/5  Internal Anal Sphincter   External Anal Sphincter   Puborectalis   Diastasis Recti   (Blank rows = not tested)        TONE: high  PROLAPSE: no  TODAY'S TREATMENT:  DATE: 05/21/22               Exercises and coordination with breathing: Supine band march - green band - 20x Supine band hip abduction - green band - 20x    Supine pball overhead - 15x    Supine pball roll out - 15x    Supine horizontal abduction in hook lying with 3 lb weights Nustep(green) - L6 seat 4 UE 6 - 8 min Side lying hip abduction 10x Side lying clam - 20x Standing h/s stretch on vibration plate Standing wall lifting arms keeping posterior pelvic Row with core engaged pulley marching 3lb - 20x Shoulder ext core engaged pulley machine 3 lb- 20x   PATIENT EDUCATION:  Education details: Access Code: [email or web address] K Person educated: Patient Education method: Explanation, Demonstration, Tactile cues, Verbal cues, and Handouts Education comprehension: verbalized understanding and returned demonstration  HOME EXERCISE PROGRAM: Access Code: [email or web address] K URL: [email or web address] Date: 05/10/2022 Prepared by: Jari Favre  Exercises - Supported Butterfly Stretch with Pelvic Floor Relaxation  - 1 x daily - 7 x weekly - 3 sets - 10 reps - Diaphragmatic Breathing  at 90/90 Supported  - 1 x daily - 7 x weekly - 3 sets - 10 reps - Supine Posterior Pelvic Tilt  - 1 x daily - 7 x weekly - 3 sets - 10 reps - Supine Hip Internal and External Rotation  - 1 x daily - 7 x weekly - 3 sets - 10 reps - Seated Shoulder Flexion with Dumbbells  - 1 x daily - 7 x weekly - 3 sets - 10 reps - Supine March with Resistance Band  - 1 x daily - 7 x weekly - 3 sets - 10 reps - Supine 90/90 Shoulder Flexion with Abdominal Bracing  - 1 x daily - 7 x weekly - 3 sets - 10 reps  Patient Education - Trigger Point Dry Needling  ASSESSMENT:  CLINICAL IMPRESSION: Today's session focused on progression of core strength and was able to progress to more standing exercises.  Pt did well coordination of core and exhale but needed to think about it a lot.  Pt has met some goals.  Still needing to work on standing strength as she has some knee discomfort with standing more than 15 minutes.  Recommended to continue per POC and pt is expected to make progress.  OBJECTIVE IMPAIRMENTS: decreased coordination, decreased endurance, decreased ROM, decreased strength, increased muscle spasms, impaired flexibility, impaired tone, postural dysfunction, and pain.   ACTIVITY LIMITATIONS: lifting, bending, sitting, and standing  PARTICIPATION LIMITATIONS: meal prep, cleaning, interpersonal relationship, and community activity  PERSONAL FACTORS: Time since onset of injury/illness/exacerbation and 1-2 comorbidities: history of cancer and radiation treatment, knee replacement Lt  are also affecting patient's functional outcome.   REHAB POTENTIAL: Good  CLINICAL DECISION MAKING: Evolving/moderate complexity  EVALUATION COMPLEXITY: Moderate   GOALS: Goals reviewed with patient? Yes  SHORT TERM GOALS: Target date: 05/15/22  Ind with initial HEP Baseline: Goal status: MET  2.  Pt reports 20% less low back pain during typical daily activities Baseline: better after exercises Goal status:  MET   LONG TERM GOALS: Target date: 07/10/22 Updated 05/21/22  Ind with toileting techniques Baseline:  Goal status: MET  2.  Pt will be able to have bowel movement without increased pain Baseline: no pain Goal status: MET  3.  Pt will have at least 50% less lumbar pain with daily activities Baseline: no pain Goal status: MET  4.  Pt will  be independent with advanced HEP to maintain improvements made throughout therapy  Baseline:  Goal status: IN PROGRESS  5.  Pt will be able to stand for at least 30 minutes without increased pain for daily activities Baseline: 15 min and I get some pressure in the left knee Goal status: IN PROGRESS   PLAN:  PT FREQUENCY: 1x/week  PT DURATION: 12 weeks  PLANNED INTERVENTIONS: Therapeutic exercises, Therapeutic activity, Neuromuscular re-education, Balance training, Gait training, Patient/Family education, Self Care, Joint mobilization, Aquatic Therapy, Dry Needling, Electrical stimulation, Cryotherapy, Moist heat, Taping, Biofeedback, Manual therapy, and Re-evaluation  PLAN FOR NEXT SESSION: progress core strength standing, obliques, mini squats, nustep 8 min , hip strength side steps   Jule Ser, PT 05/22/2022, 9:28 AM

## 2022-05-22 ENCOUNTER — Encounter: Payer: Self-pay | Admitting: Physical Therapy

## 2022-05-22 ENCOUNTER — Ambulatory Visit: Payer: Medicare Other | Admitting: Physical Therapy

## 2022-05-22 DIAGNOSIS — M6281 Muscle weakness (generalized): Secondary | ICD-10-CM

## 2022-05-22 DIAGNOSIS — R293 Abnormal posture: Secondary | ICD-10-CM

## 2022-05-22 DIAGNOSIS — M62838 Other muscle spasm: Secondary | ICD-10-CM | POA: Diagnosis not present

## 2022-05-24 DIAGNOSIS — F330 Major depressive disorder, recurrent, mild: Secondary | ICD-10-CM | POA: Diagnosis not present

## 2022-05-29 ENCOUNTER — Encounter: Payer: Self-pay | Admitting: Physical Therapy

## 2022-05-29 ENCOUNTER — Ambulatory Visit: Payer: Medicare Other | Attending: Gynecologic Oncology | Admitting: Physical Therapy

## 2022-05-29 DIAGNOSIS — R293 Abnormal posture: Secondary | ICD-10-CM | POA: Diagnosis not present

## 2022-05-29 DIAGNOSIS — M62838 Other muscle spasm: Secondary | ICD-10-CM | POA: Diagnosis not present

## 2022-05-29 DIAGNOSIS — M6281 Muscle weakness (generalized): Secondary | ICD-10-CM | POA: Diagnosis not present

## 2022-05-29 NOTE — Therapy (Signed)
OUTPATIENT PHYSICAL THERAPY FEMALE PELVIC TREATMENT   Patient Name: Ashley Villegas MRN: [id number] DOB:September 07, 1950, 72 y.o., female Today's Date: 05/29/2022  END OF SESSION:  PT End of Session - 05/29/22 0917     Visit Number 7    Date for PT Re-Evaluation 07/10/22    Authorization Type Medicare    Progress Note Due on Visit 10    PT Start Time 0848    PT Stop Time 0928    PT Time Calculation (min) 40 min    Activity Tolerance Patient tolerated treatment well    Behavior During Therapy [facility] for tasks assessed/performed                   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, recurrent    Anxiety    Arthritis    Carpal tunnel syndrome, bilateral upper limbs    Complication of anesthesia    Depression    Family history of uterine cancer    GERD (gastroesophageal reflux disease)    History of radiation therapy 11/08/2016-12/27/2016   pelvis 45 Gy in 25 fractions, pelvis boost 14 gy in 7 fractions   History of radiation therapy    endometrial - pelvis IMRT 11/10/2019-11/27/2019   Hypothyroidism    on synthroid    PONV (postoperative nausea and vomiting)    Past Surgical History:  Procedure Laterality Date   CARPAL TUNNEL RELEASE Bilateral 2020   CESAREAN SECTION     COLONOSCOPY     DIAGNOSTIC LAPAROSCOPY     fibroid tumors on her ovaries    DILATION AND CURETTAGE OF UTERUS     HAND SURGERY Right 2019   carpal tunnel   HYSTEROSCOPY WITH D & C N/A 09/15/2014   Procedure: DILATATION AND CURETTAGE /HYSTEROSCOPY/MYOSURE RESECTION OF POLYP;  Surgeon: Janyth Pupa, DO;  Location: [facility];  Service: Gynecology;  Laterality: N/A;   KNEE SURGERY     LYMPH NODE DISSECTION N/A 10/26/2014   Procedure: POSS LYMPHADENECTOMY ;  Surgeon: [name], MD;  Location: [facility];  Service: Gynecology;  Laterality: N/A;   ROBOTIC ASSISTED TOTAL HYSTERECTOMY WITH BILATERAL SALPINGO OOPHERECTOMY Bilateral 10/26/2014    Procedure: ROBOTIC ASSISTED TOTAL HYSTERECTOMY WITH BILATERAL SALPINGO OOPHORECTOMY;  Surgeon: [name], MD;  Location: [facility];  Service: Gynecology;  Laterality: Bilateral;   TOTAL KNEE ARTHROPLASTY Left 04/15/2019   Procedure: LEFT TOTAL KNEE ARTHROPLASTY;  Surgeon: Melrose Nakayama, MD;  Location: [facility];  Service: Orthopedics;  Laterality: Left;   TUBAL LIGATION     WISDOM TOOTH EXTRACTION     Patient Active Problem List   Diagnosis Date Noted   Preventive measure 11/17/2019   Cancer associated pain 11/03/2019   Class II obesity 05/26/2019   Primary osteoarthritis of left knee 04/15/2019   Inflammatory arthritis 03/14/2018   Neuropathy 01/16/2018   Vitamin D deficiency 11/11/2017   Hypothyroid 11/09/2017   Total body pain 11/09/2017   Sinusitis 10/11/2017   Mediastinal lymphadenopathy 10/11/2017   Skin rash 10/11/2017   Genetic testing 08/29/2017   Hypothyroidism 08/08/2017   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[place]) 07/30/2017   Depression 07/30/2017   Encounter for antineoplastic immunotherapy 07/30/2017   Metastasis to lung ([place]) 07/30/2017   Groin pain, right 07/30/2017   Family history of uterine cancer    Left groin pain    Endometrial cancer ([place])     PCP: Charlane Ferretti, MD   REFERRING PROVIDER: Lafonda Mosses, MD  REFERRING DIAG: R10.2 (ICD-10-CM) - Vaginal pain   THERAPY DIAG:  Other muscle spasm  Muscle weakness (generalized)  Abnormal posture  Rationale for Evaluation and Treatment: Rehabilitation  ONSET DATE: at 6 months ago  SUBJECTIVE:                                                                                                                                                                                           SUBJECTIVE STATEMENT: I am still feeling good.  Today is a test to see how much I can stand when I bake cookies. Fluid intake: Yes: 4 x 16 oz water and coffee in the morning    PAIN:  PAIN:  Are you having pain? No     PRECAUTIONS: None  WEIGHT BEARING RESTRICTIONS: No  FALLS:  Has patient fallen in last 6 months? No  LIVING ENVIRONMENT: Lives with: lives alone Lives in: House/apartment   OCCUPATION: retired  PLOF: Independent  PATIENT GOALS: not having pain  PERTINENT HISTORY:  Lt TKA, complete hysterectomy, endometrial cancer, pelvic radiation therapy 2018 Sexual abuse: No  BOWEL MOVEMENT: Pain with bowel movement: Yes Type of bowel movement:Type (Bristol Stool Scale) normal, regular, does strain Fully empty rectum: Yes:   Leakage: No Pads: No Fiber supplement:   URINATION: Pain with urination: No Fully empty bladder: Yes:   Stream: Strong Urgency: Yes:   Frequency: every 2 hours Leakage: Urge to void and Walking to the bathroom Pads: Yes: only if I go out  INTERCOURSE: Pain with intercourse: not sexually active   PREGNANCY: Vaginal deliveries 1 Tearing  C-section deliveries 1 Currently pregnant  Miscarriage : 1  PROLAPSE: None   OBJECTIVE:   DIAGNOSTIC FINDINGS:  MRI spine:  chronic straightening of lumbar lordosis, mild retrolisthesis of L4 on L5 and anterolisthesis L5 on S1 stable from one year ago    No lower cord root abnormalities  L3-4 mild bil bulge and foraminal sprurring  L4-5 mild facet hypertroph and bil disc bulge and foraminal stenosis  L5-S1 mild anterolisthesis  PATIENT SURVEYS:    PFIQ-7   COGNITION: Overall cognitive status: Within functional limits for tasks assessed     SENSATION: Light touch:  Proprioception:   MUSCLE LENGTH: Hamstrings: 75%; Left + pain in knee and back but not pain that is consistent with nerve impingement Thomas test:  LUMBAR SPECIAL TESTS:  Straight leg raise test: Negative  FUNCTIONAL TESTS:  Single leg - needs UE assistance - trendelenburg bil  GAIT:  Comments: WFL   POSTURE: increased lumbar lordosis, increased thoracic kyphosis, anterior pelvic tilt, and weight shift right  PELVIC  ALIGNMENT:  LUMBARAROM/PROM:  A/PROM  A/PROM  eval  Flexion WFL +pain coming back to standing  Extension   Right lateral flexion   Left lateral flexion   Right rotation   Left rotation    (Blank rows = not tested)  LOWER EXTREMITY ROM:  Passive ROM Right eval Left eval  Hip flexion 80% 75% +pain  Hip extension    Hip abduction    Hip adduction    Hip internal rotation    Hip external rotation  75% +pain  Knee flexion    Knee extension    Ankle dorsiflexion    Ankle plantarflexion    Ankle inversion    Ankle eversion     (Blank rows = not tested)  LOWER EXTREMITY MMT: MMT 0-5 scale MMT Right eval Left eval  Hip flexion    Hip extension 5 4/5  Hip abduction 5 4/5  Hip adduction 5 5  Hip internal rotation    Hip external rotation    Knee flexion    Knee extension    Ankle dorsiflexion    Ankle plantarflexion    Ankle inversion    Ankle eversion     PALPATION:   General  lumbar tight, right groin TTP                External Perineal Exam TTP Rt transverse peroneus and ischial cavernosis                             Internal Pelvic Floor superficial and pressing into levators bil very TTP unable to palpate further, no increased tenderness to bladder, urethra lifts with kegel but no circular contraction  Patient confirms identification and approves PT to assess internal pelvic floor and treatment Yes No emotional/communication barriers or cognitive limitation. Patient is motivated to learn. Patient understands and agrees with treatment goals and plan. PT explains patient will be examined in standing, sitting, and lying down to see how their muscles and joints work. When they are ready, they will be asked to remove their underwear so PT can examine their perineum. The patient is also given the option of providing their own chaperone as one is not provided in our facility. The patient also has the right and is explained the right to defer or refuse any part of the  evaluation or treatment including the internal exam. With the patient's consent, PT will use one gloved finger to gently assess the muscles of the pelvic floor, seeing how well it contracts and relaxes and if there is muscle symmetry. After, the patient will get dressed and PT and patient will discuss exam findings and plan of care. PT and patient discuss plan of care, schedule, attendance policy and HEP activities.   PELVIC MMT:   MMT eval  Vaginal 1-2/5, 1 rep with pain and needed cues to get to 2/5  Internal Anal Sphincter   External Anal Sphincter   Puborectalis   Diastasis Recti   (Blank rows = not tested)        TONE: high  PROLAPSE: no  TODAY'S TREATMENT:  DATE: 05/29/22               Exercises and coordination with breathing: Standing machine - hip abduction, flexion, ext - 15 x bil 10 lb Standing mini squat - band around knees - 10x Seated yellow loop - hip abduction 10x bil; add 4lb weight 10x  Wall plank reaches - 20x Side step - yellow loop Nustep level 6 x 2 min; level 4 x 6 min H/s stretch and hip flexor stretch at the vibration plate   PATIENT EDUCATION:  Education details: Access Code: [email or web address] Person educated: Patient Education method: Explanation, Demonstration, Tactile cues, Verbal cues, and Handouts Education comprehension: verbalized understanding and returned demonstration  HOME EXERCISE PROGRAM: Access Code: [email or web address] K URL: [email or web address] Date: 05/10/2022 Prepared by: Jari Favre  Exercises - Supported Butterfly Stretch with Pelvic Floor Relaxation  - 1 x daily - 7 x weekly - 3 sets - 10 reps - Diaphragmatic Breathing at 90/90 Supported  - 1 x daily - 7 x weekly - 3 sets - 10 reps - Supine Posterior Pelvic Tilt  - 1 x daily - 7 x weekly - 3 sets - 10 reps - Supine Hip Internal and External Rotation   - 1 x daily - 7 x weekly - 3 sets - 10 reps - Seated Shoulder Flexion with Dumbbells  - 1 x daily - 7 x weekly - 3 sets - 10 reps - Supine March with Resistance Band  - 1 x daily - 7 x weekly - 3 sets - 10 reps - Supine 90/90 Shoulder Flexion with Abdominal Bracing  - 1 x daily - 7 x weekly - 3 sets - 10 reps  Patient Education - Trigger Point Dry Needling  ASSESSMENT:  CLINICAL IMPRESSION: Today's session focused on progression of core strength and was able to progress to more resistance with standing exercises.  Pt did well coordination of core and exhale but still needed cues a few times.  Pt still working on strength for endurance when standing.  Recommended to continue per POC and pt is expected to make progress.  OBJECTIVE IMPAIRMENTS: decreased coordination, decreased endurance, decreased ROM, decreased strength, increased muscle spasms, impaired flexibility, impaired tone, postural dysfunction, and pain.   ACTIVITY LIMITATIONS: lifting, bending, sitting, and standing  PARTICIPATION LIMITATIONS: meal prep, cleaning, interpersonal relationship, and community activity  PERSONAL FACTORS: Time since onset of injury/illness/exacerbation and 1-2 comorbidities: history of cancer and radiation treatment, knee replacement Lt  are also affecting patient's functional outcome.   REHAB POTENTIAL: Good  CLINICAL DECISION MAKING: Evolving/moderate complexity  EVALUATION COMPLEXITY: Moderate   GOALS: Goals reviewed with patient? Yes  SHORT TERM GOALS: Target date: 05/15/22  Ind with initial HEP Baseline: Goal status: MET  2.  Pt reports 20% less low back pain during typical daily activities Baseline: better after exercises Goal status: MET   LONG TERM GOALS: Target date: 07/10/22 Updated 05/21/22  Ind with toileting techniques Baseline:  Goal status: MET  2.  Pt will be able to have bowel movement without increased pain Baseline: no pain Goal status: MET  3.  Pt will have at  least 50% less lumbar pain with daily activities Baseline: no pain Goal status: MET  4.  Pt will be independent with advanced HEP to maintain improvements made throughout therapy  Baseline:  Goal status: IN PROGRESS  5.  Pt will be able to stand for at least 30 minutes without increased pain for daily activities Baseline: 15 min and I get some pressure in  the left knee Goal status: IN PROGRESS   PLAN:  PT FREQUENCY: 1x/week  PT DURATION: 12 weeks  PLANNED INTERVENTIONS: Therapeutic exercises, Therapeutic activity, Neuromuscular re-education, Balance training, Gait training, Patient/Family education, Self Care, Joint mobilization, Aquatic Therapy, Dry Needling, Electrical stimulation, Cryotherapy, Moist heat, Taping, Biofeedback, Manual therapy, and Re-evaluation  PLAN FOR NEXT SESSION: progress core strength standing, obliques, mini squats, nustep 8 min , hip strength side steps   Camillo Flaming Lougenia Morrissey, PT 05/29/2022, 11:12 AM

## 2022-05-30 DIAGNOSIS — E039 Hypothyroidism, unspecified: Secondary | ICD-10-CM | POA: Diagnosis not present

## 2022-06-05 ENCOUNTER — Ambulatory Visit: Payer: Medicare Other | Admitting: Physical Therapy

## 2022-06-05 DIAGNOSIS — M6281 Muscle weakness (generalized): Secondary | ICD-10-CM | POA: Diagnosis not present

## 2022-06-05 DIAGNOSIS — R293 Abnormal posture: Secondary | ICD-10-CM | POA: Diagnosis not present

## 2022-06-05 DIAGNOSIS — M62838 Other muscle spasm: Secondary | ICD-10-CM | POA: Diagnosis not present

## 2022-06-05 NOTE — Therapy (Signed)
OUTPATIENT PHYSICAL THERAPY FEMALE PELVIC TREATMENT   Patient Name: Ashley Villegas MRN: [id number] DOB:1950/09/19, 72 y.o., female Today's Date: 06/05/2022  END OF SESSION:  PT End of Session - 06/05/22 0907     Visit Number 8    Date for PT Re-Evaluation 07/10/22    Authorization Type Medicare    PT Start Time 0847    PT Stop Time 0926    PT Time Calculation (min) 39 min    Activity Tolerance Patient tolerated treatment well    Behavior During Therapy [facility] for tasks assessed/performed                    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, recurrent    Anxiety    Arthritis    Carpal tunnel syndrome, bilateral upper limbs    Complication of anesthesia    Depression    Family history of uterine cancer    GERD (gastroesophageal reflux disease)    History of radiation therapy 11/08/2016-12/27/2016   pelvis 45 Gy in 25 fractions, pelvis boost 14 gy in 7 fractions   History of radiation therapy    endometrial - pelvis IMRT 11/10/2019-11/27/2019   Hypothyroidism    on synthroid    PONV (postoperative nausea and vomiting)    Past Surgical History:  Procedure Laterality Date   CARPAL TUNNEL RELEASE Bilateral 2020   CESAREAN SECTION     COLONOSCOPY     DIAGNOSTIC LAPAROSCOPY     fibroid tumors on her ovaries    DILATION AND CURETTAGE OF UTERUS     HAND SURGERY Right 2019   carpal tunnel   HYSTEROSCOPY WITH D & C N/A 09/15/2014   Procedure: DILATATION AND CURETTAGE /HYSTEROSCOPY/MYOSURE RESECTION OF POLYP;  Surgeon: Janyth Pupa, DO;  Location: [facility];  Service: Gynecology;  Laterality: N/A;   KNEE SURGERY     LYMPH NODE DISSECTION N/A 10/26/2014   Procedure: POSS LYMPHADENECTOMY ;  Surgeon: [name], MD;  Location: [facility];  Service: Gynecology;  Laterality: N/A;   ROBOTIC ASSISTED TOTAL HYSTERECTOMY WITH BILATERAL SALPINGO OOPHERECTOMY Bilateral 10/26/2014   Procedure: ROBOTIC ASSISTED TOTAL  HYSTERECTOMY WITH BILATERAL SALPINGO OOPHORECTOMY;  Surgeon: [name], MD;  Location: [facility];  Service: Gynecology;  Laterality: Bilateral;   TOTAL KNEE ARTHROPLASTY Left 04/15/2019   Procedure: LEFT TOTAL KNEE ARTHROPLASTY;  Surgeon: Melrose Nakayama, MD;  Location: [facility];  Service: Orthopedics;  Laterality: Left;   TUBAL LIGATION     WISDOM TOOTH EXTRACTION     Patient Active Problem List   Diagnosis Date Noted   Preventive measure 11/17/2019   Cancer associated pain 11/03/2019   Class II obesity 05/26/2019   Primary osteoarthritis of left knee 04/15/2019   Inflammatory arthritis 03/14/2018   Neuropathy 01/16/2018   Vitamin D deficiency 11/11/2017   Hypothyroid 11/09/2017   Total body pain 11/09/2017   Sinusitis 10/11/2017   Mediastinal lymphadenopathy 10/11/2017   Skin rash 10/11/2017   Genetic testing 08/29/2017   Hypothyroidism 08/08/2017   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[place]) 07/30/2017   Depression 07/30/2017   Encounter for antineoplastic immunotherapy 07/30/2017   Metastasis to lung ([place]) 07/30/2017   Groin pain, right 07/30/2017   Family history of uterine cancer    Left groin pain    Endometrial cancer ([place])     PCP: Charlane Ferretti, MD   REFERRING PROVIDER: Lafonda Mosses, MD   REFERRING DIAG: R10.2 (ICD-10-CM) - Vaginal  pain   THERAPY DIAG:  Other muscle spasm  Muscle weakness (generalized)  Abnormal posture  Rationale for Evaluation and Treatment: Rehabilitation  ONSET DATE: at 6 months ago  SUBJECTIVE:                                                                                                                                                                                           SUBJECTIVE STATEMENT: I am still feeling good.  Today is a test to see how much I can stand when I bake cookies. Fluid intake: Yes: 4 x 16 oz water and coffee in the morning    PAIN:  PAIN:  Are you having pain? No    PRECAUTIONS: None  WEIGHT BEARING  RESTRICTIONS: No  FALLS:  Has patient fallen in last 6 months? No  LIVING ENVIRONMENT: Lives with: lives alone Lives in: House/apartment   OCCUPATION: retired  PLOF: Independent  PATIENT GOALS: not having pain  PERTINENT HISTORY:  Lt TKA, complete hysterectomy, endometrial cancer, pelvic radiation therapy 2018 Sexual abuse: No  BOWEL MOVEMENT: Pain with bowel movement: Yes Type of bowel movement:Type (Bristol Stool Scale) normal, regular, does strain Fully empty rectum: Yes:   Leakage: No Pads: No Fiber supplement:   URINATION: Pain with urination: No Fully empty bladder: Yes:   Stream: Strong Urgency: Yes:   Frequency: every 2 hours Leakage: Urge to void and Walking to the bathroom Pads: Yes: only if I go out  INTERCOURSE: Pain with intercourse: not sexually active   PREGNANCY: Vaginal deliveries 1 Tearing  C-section deliveries 1 Currently pregnant  Miscarriage : 1  PROLAPSE: None   OBJECTIVE:   DIAGNOSTIC FINDINGS:  MRI spine:  chronic straightening of lumbar lordosis, mild retrolisthesis of L4 on L5 and anterolisthesis L5 on S1 stable from one year ago    No lower cord root abnormalities  L3-4 mild bil bulge and foraminal sprurring  L4-5 mild facet hypertroph and bil disc bulge and foraminal stenosis  L5-S1 mild anterolisthesis  PATIENT SURVEYS:    PFIQ-7   COGNITION: Overall cognitive status: Within functional limits for tasks assessed     SENSATION: Light touch:  Proprioception:   MUSCLE LENGTH: Hamstrings: 75%; Left + pain in knee and back but not pain that is consistent with nerve impingement Thomas test:  LUMBAR SPECIAL TESTS:  Straight leg raise test: Negative  FUNCTIONAL TESTS:  Single leg - needs UE assistance - trendelenburg bil  GAIT:  Comments: WFL   POSTURE: increased lumbar lordosis, increased thoracic kyphosis, anterior pelvic tilt, and weight shift right  PELVIC ALIGNMENT:  LUMBARAROM/PROM:  A/PROM A/PROM   eval  Flexion WFL +  pain coming back to standing  Extension   Right lateral flexion   Left lateral flexion   Right rotation   Left rotation    (Blank rows = not tested)  LOWER EXTREMITY ROM:  Passive ROM Right eval Left eval  Hip flexion 80% 75% +pain  Hip extension    Hip abduction    Hip adduction    Hip internal rotation    Hip external rotation  75% +pain  Knee flexion    Knee extension    Ankle dorsiflexion    Ankle plantarflexion    Ankle inversion    Ankle eversion     (Blank rows = not tested)  LOWER EXTREMITY MMT: MMT 0-5 scale MMT Right eval Left eval  Hip flexion    Hip extension 5 4/5  Hip abduction 5 4/5  Hip adduction 5 5  Hip internal rotation    Hip external rotation    Knee flexion    Knee extension    Ankle dorsiflexion    Ankle plantarflexion    Ankle inversion    Ankle eversion     PALPATION:   General  lumbar tight, right groin TTP                External Perineal Exam TTP Rt transverse peroneus and ischial cavernosis                             Internal Pelvic Floor superficial and pressing into levators bil very TTP unable to palpate further, no increased tenderness to bladder, urethra lifts with kegel but no circular contraction  Patient confirms identification and approves PT to assess internal pelvic floor and treatment Yes No emotional/communication barriers or cognitive limitation. Patient is motivated to learn. Patient understands and agrees with treatment goals and plan. PT explains patient will be examined in standing, sitting, and lying down to see how their muscles and joints work. When they are ready, they will be asked to remove their underwear so PT can examine their perineum. The patient is also given the option of providing their own chaperone as one is not provided in our facility. The patient also has the right and is explained the right to defer or refuse any part of the evaluation or treatment including the internal  exam. With the patient's consent, PT will use one gloved finger to gently assess the muscles of the pelvic floor, seeing how well it contracts and relaxes and if there is muscle symmetry. After, the patient will get dressed and PT and patient will discuss exam findings and plan of care. PT and patient discuss plan of care, schedule, attendance policy and HEP activities.   PELVIC MMT:   MMT eval  Vaginal 1-2/5, 1 rep with pain and needed cues to get to 2/5  Internal Anal Sphincter   External Anal Sphincter   Puborectalis   Diastasis Recti   (Blank rows = not tested)        TONE: high  PROLAPSE: no  TODAY'S TREATMENT:  DATE: 06/05/22               Exercises and coordination with breathing: Standing machine - hip abduction, flexion,  - 15 x bil 15 lb Standing mini squat - band around knees - 10x Pelvic tilt at wall - UE raising pball - 20x Wall plank reaches - 20x Bicep curl - 3# bil pulleys - 15x Row - 3# bil pulleys- 15x Side step - 10 lb pulleys - 15x bil Lat pull down pulleys - 13# in each hand Nustep (GREEN) level 5 x 6 min H/s stretch and hip flexor stretch at the vibration plate   PATIENT EDUCATION:  Education details: Access Code: [email or web address] Person educated: Patient Education method: Explanation, Demonstration, Tactile cues, Verbal cues, and Handouts Education comprehension: verbalized understanding and returned demonstration  HOME EXERCISE PROGRAM: Access Code: [email or web address] K URL: [email or web address] Date: 05/10/2022 Prepared by: Jari Favre  Exercises - Supported Butterfly Stretch with Pelvic Floor Relaxation  - 1 x daily - 7 x weekly - 3 sets - 10 reps - Diaphragmatic Breathing at 90/90 Supported  - 1 x daily - 7 x weekly - 3 sets - 10 reps - Supine Posterior Pelvic Tilt  - 1 x daily - 7 x weekly - 3 sets - 10 reps -  Supine Hip Internal and External Rotation  - 1 x daily - 7 x weekly - 3 sets - 10 reps - Seated Shoulder Flexion with Dumbbells  - 1 x daily - 7 x weekly - 3 sets - 10 reps - Supine March with Resistance Band  - 1 x daily - 7 x weekly - 3 sets - 10 reps - Supine 90/90 Shoulder Flexion with Abdominal Bracing  - 1 x daily - 7 x weekly - 3 sets - 10 reps  Patient Education - Trigger Point Dry Needling  ASSESSMENT:  CLINICAL IMPRESSION: Today's session focused on progression of core strength and final HEP.  Pt did well and is expected to maintain improvements with continued work with HEP at home. Pt will d/c today  OBJECTIVE IMPAIRMENTS: decreased coordination, decreased endurance, decreased ROM, decreased strength, increased muscle spasms, impaired flexibility, impaired tone, postural dysfunction, and pain.   ACTIVITY LIMITATIONS: lifting, bending, sitting, and standing  PARTICIPATION LIMITATIONS: meal prep, cleaning, interpersonal relationship, and community activity  PERSONAL FACTORS: Time since onset of injury/illness/exacerbation and 1-2 comorbidities: history of cancer and radiation treatment, knee replacement Lt  are also affecting patient's functional outcome.   REHAB POTENTIAL: Good  CLINICAL DECISION MAKING: Evolving/moderate complexity  EVALUATION COMPLEXITY: Moderate   GOALS: Goals reviewed with patient? Yes  SHORT TERM GOALS: Target date: 05/15/22  Ind with initial HEP Baseline: Goal status: MET  2.  Pt reports 20% less low back pain during typical daily activities Baseline: better after exercises Goal status: MET   LONG TERM GOALS: Target date: 07/10/22 Updated 05/21/22  Ind with toileting techniques Baseline:  Goal status: MET  2.  Pt will be able to have bowel movement without increased pain Baseline: no pain Goal status: MET  3.  Pt will have at least 50% less lumbar pain with daily activities Baseline: no pain Goal status: MET  4.  Pt will be  independent with advanced HEP to maintain improvements made throughout therapy  Baseline:  Goal status: MET  5.  Pt will be able to stand for at least 30 minutes without increased pain for daily activities Baseline: standing the entire time making cookies, 30+min Goal status: MET   PLAN:  PT  FREQUENCY: 1x/week  PT DURATION: 12 weeks  PLANNED INTERVENTIONS: Therapeutic exercises, Therapeutic activity, Neuromuscular re-education, Balance training, Gait training, Patient/Family education, Self Care, Joint mobilization, Aquatic Therapy, Dry Needling, Electrical stimulation, Cryotherapy, Moist heat, Taping, Biofeedback, Manual therapy, and Re-evaluation  PLAN FOR NEXT SESSION: d/c today   Jule Ser, PT 06/05/2022, 9:34 AM  PHYSICAL THERAPY DISCHARGE SUMMARY  Visits from Start of Care: 8  Current functional level related to goals / functional outcomes: See above goals   Remaining deficits: See above   Education / Equipment: HEP   Patient agrees to discharge. Patient goals were met. Patient is being discharged due to meeting the stated rehab goals.  Gustavus Bryant, PT, DPT 06/05/22 9:34 AM

## 2022-06-06 DIAGNOSIS — E039 Hypothyroidism, unspecified: Secondary | ICD-10-CM | POA: Diagnosis not present

## 2022-06-12 ENCOUNTER — Encounter: Payer: Medicare Other | Admitting: Physical Therapy

## 2022-06-14 ENCOUNTER — Other Ambulatory Visit: Payer: Self-pay | Admitting: Cardiology

## 2022-07-14 ENCOUNTER — Other Ambulatory Visit: Payer: Self-pay | Admitting: Cardiology

## 2022-07-18 NOTE — Progress Notes (Signed)
Radiation Oncology         [phone] ________________________________  Name: Ashley Villegas MRN: [id number]  Date: 07/19/2022  DOB: [birth year]  Follow-Up Visit Note  CC: Thana Ates, MD  Kirby Funk, MD    ICD-10-CM   1. Endometrial cancer [C54.1]  C54.1       Diagnosis: Recurrent endometrial cancer, original stage IA grade 1, s/p hysterectomy  with enhancing lesion centered along the right levator ani muscle, status post additional radiation therapy   Interval Since Last Radiation: 2 years, 7 months, and 22 days   Radiation Treatment Dates: 11/10/2019 through 11/27/2019 Site Technique Total Dose (Gy) Dose per Fx (Gy) Completed Fx Beam Energies  Perineum: Pelvis IMRT 28/28 2 14/14 6X   Narrative:  The patient returns today for routine follow-up. She was last seen here for follow up on 01/11/22. Since her last visit, the patient followed up with Dr. Pricilla Holm on 04/06/22. During which time, the patient reported some intermittent right sided pelvic pain which happens when she is sitting for longer periods of time. She otherwise denied any vaginal discharge or bleeding and endorsed baseline urinary function. Pelvic exam performed during this visit was notable for right sided vaginal pain on palpation. In light of this finding, Dr. Pricilla Holm ordered an MRI to rule out any recurrent disease. (The patient was also noted to report being sick recently with a sinus infection. She was given an inhaler by her PCP due to SOB and wheezing).               MRI of the pelvis with and without contrast on 04/16/22 showed no evidence of recurrent or metastatic disease in the pelvis.            Other imaging performed in the interval includes: -- Bone scan on 02/21/22 for evaluation left knee pain (s/p left total knee arthroplasty 2 years ago) showed nonspecific increased radiotracer uptake localizes to the bone overlying the left knee on the delayed phase images only. Findings were nonspecific and  may reflect bone remodeling versus mild loosening. -- MRI of the lumbar spine on 04/03/22 for evaluation of lower back pain showed moderate sized right foraminal disc herniation at L2-3 with foraminal stenosis; no lumbar spinal stenosis but evidence of grade 1 anterolisthesis at L5-S1 with moderate to severe facet anthropathy (without neural impingement); and no acute osseous abnormalities in the lumbar spine. -- Bilateral diagnostic mammogram on 04/13/22 which showed no evidence of malignancy or acute findings within the left breast. Imaging was performed due to reports of generalized pain within the outer left breast radiating towards to the left nipple x 1 week.     She completed pelvic floor physical therapy in March.  She also underwent 2 epidural injections in the lower back.  She reports she is pain-free at this time.  She is quite happy a with these results.  She did notice slight blood tinge after wiping for urination but no significant vaginal bleeding or hematuria.  She denies any rectal bleeding.  Allergies:  is allergic to oxycodone and hydrocodone.  Meds: Current Outpatient Medications  Medication Sig Dispense Refill   albuterol (VENTOLIN HFA) 108 (90 Base) MCG/ACT inhaler SMARTSIG:2 Puff(s) Via Inhaler 4 Times Daily PRN     buPROPion (WELLBUTRIN SR) 150 MG 12 hr tablet Take 150 mg by mouth 2 (two) times daily.     Cholecalciferol (VITAMIN D3) 25 MCG (1000 UT) CAPS Take 1,000 Units by mouth daily.  FLUoxetine (PROZAC) 40 MG capsule Take 40 mg by mouth daily. Per patient, not taken in several months     furosemide (LASIX) 40 MG tablet Take 40 mg by mouth 2 (two) times daily.     levothyroxine (SYNTHROID) 100 MCG tablet Take 100 mcg by mouth daily before breakfast.     metoprolol succinate (TOPROL-XL) 25 MG 24 hr tablet Take 0.5 tablets (12.5 mg total) by mouth daily. 45 tablet 3   rosuvastatin (CRESTOR) 10 MG tablet TAKE 1 TABLET BY MOUTH EVERY DAY 30 tablet 3   No current  facility-administered medications for this encounter.    Physical Findings: The patient is in no acute distress. Patient is alert and oriented.  height is 5' (1.524 m) and weight is 176 lb 4 oz (79.9 kg). Her temporal temperature is 96.6 F (35.9 C) (abnormal). Her blood pressure is 133/69 and her pulse is 70. Her respiration is 18 and oxygen saturation is 98%. . Lungs are clear to auscultation bilaterally. Heart has regular rate and rhythm. No palpable cervical, supraclavicular, or axillary adenopathy. Abdomen soft, non-tender, normal bowel sounds.  On pelvic examination the external genitalia were unremarkable. A speculum exam was performed. There are no mucosal lesions noted in the vaginal vault.  Radiation changes are noted along the right lateral vaginal wall.  Slight amount of blood in this area but no suspicious lesions noted. A On bimanual and rectovaginal examination there were no pelvic masses appreciated.  Patient tolerated the pelvic exam extremely well which she has not done in the past.   Lab Findings: Lab Results  Component Value Date   WBC 4.7 10/13/2021   HGB 13.4 10/13/2021   HCT 40.1 10/13/2021   MCV 92 10/13/2021   PLT 233 10/13/2021    Radiographic Findings: No results found.  Impression: Recurrent endometrial cancer, original stage IA grade 1, s/p hysterectomy  with enhancing lesion centered along the right levator ani muscle, status post additional radiation therapy   No evidence of recurrence on clinical exam today.  As above the patient is pain-free at this time which she is quite happy.  She did have 1 episode of slight vaginal spotting after wiping but this is likely related to radiation changes along the right lateral vaginal wall.  Plan: She will follow-up with Dr. Pricilla Holm in June.  She is now 2 and half years out from 3 treatment and doing well.  She is scheduled for follow-up in December which will be 6 months after Dr. Winferd Humphrey follow-up appointment.   25  minutes of total time was spent for this patient encounter, including preparation, face-to-face counseling with the patient and coordination of care, physical exam, and documentation of the encounter. ____________________________________  Billie Lade, PhD, MD  This document serves as a record of services personally performed by Antony Blackbird, MD. It was created on his behalf by Neena Rhymes, a trained medical scribe. The creation of this record is based on the scribe's personal observations and the provider's statements to them. This document has been checked and approved by the attending provider.

## 2022-07-19 ENCOUNTER — Encounter: Payer: Self-pay | Admitting: Radiation Oncology

## 2022-07-19 ENCOUNTER — Ambulatory Visit
Admission: RE | Admit: 2022-07-19 | Discharge: 2022-07-19 | Disposition: A | Discharge status: Home / Self Care | Payer: Medicare Other | Source: Ambulatory Visit | Attending: Radiation Oncology | Admitting: Radiation Oncology

## 2022-07-19 ENCOUNTER — Other Ambulatory Visit: Payer: Self-pay

## 2022-07-19 VITALS — BP 133/69 | HR 70 | Temp 96.6°F | Resp 18 | Ht 60.0 in | Wt 176.2 lb

## 2022-07-19 DIAGNOSIS — Z90710 Acquired absence of both cervix and uterus: Secondary | ICD-10-CM | POA: Insufficient documentation

## 2022-07-19 DIAGNOSIS — Z923 Personal history of irradiation: Secondary | ICD-10-CM | POA: Diagnosis not present

## 2022-07-19 DIAGNOSIS — C541 Malignant neoplasm of endometrium: Secondary | ICD-10-CM | POA: Diagnosis not present

## 2022-07-19 DIAGNOSIS — Z8542 Personal history of malignant neoplasm of other parts of uterus: Secondary | ICD-10-CM | POA: Insufficient documentation

## 2022-07-19 NOTE — Progress Notes (Signed)
Ashley Villegas is here today for follow up post radiation to the pelvic.  They completed their radiation on: 11/27/19   Does the patient complain of any of the following:  Pain:No Abdominal bloating: No Diarrhea/Constipation: No Nausea/Vomiting: No Vaginal Discharge: No Blood in Urine or Stool: Yes, noted blood on tissue after cleaning.  Urinary Issues (dysuria/incomplete emptying/ incontinence/ increased frequency/urgency): No Does patient report using vaginal dilator 2-3 times a week and/or sexually active 2-3 weeks: No Post radiation skin changes: No   Additional comments if applicable: Patient has knot under right arm that she is concerned about.   BP 133/69 (BP Location: Left Arm, Patient Position: Sitting)   Pulse 70   Temp (!) 96.6 F (35.9 C) (Temporal)   Resp 18   Ht 5' (1.524 m)   Wt 176 lb 4 oz (79.9 kg)   SpO2 98%   BMI 34.42 kg/m

## 2022-08-06 DIAGNOSIS — E039 Hypothyroidism, unspecified: Secondary | ICD-10-CM | POA: Diagnosis not present

## 2022-09-20 ENCOUNTER — Encounter: Payer: Self-pay | Admitting: Gynecologic Oncology

## 2022-09-21 ENCOUNTER — Other Ambulatory Visit: Payer: Self-pay

## 2022-09-21 ENCOUNTER — Encounter: Payer: Self-pay | Admitting: Gynecologic Oncology

## 2022-09-21 ENCOUNTER — Inpatient Hospital Stay: Payer: Medicare Other | Attending: Gynecologic Oncology | Admitting: Gynecologic Oncology

## 2022-09-21 VITALS — BP 123/73 | HR 69 | Temp 97.9°F | Ht 60.63 in | Wt 177.6 lb

## 2022-09-21 DIAGNOSIS — Z8544 Personal history of malignant neoplasm of other female genital organs: Secondary | ICD-10-CM | POA: Diagnosis not present

## 2022-09-21 DIAGNOSIS — Z8542 Personal history of malignant neoplasm of other parts of uterus: Secondary | ICD-10-CM

## 2022-09-21 DIAGNOSIS — Z923 Personal history of irradiation: Secondary | ICD-10-CM | POA: Diagnosis not present

## 2022-09-21 DIAGNOSIS — Z90710 Acquired absence of both cervix and uterus: Secondary | ICD-10-CM | POA: Diagnosis not present

## 2022-09-21 DIAGNOSIS — C541 Malignant neoplasm of endometrium: Secondary | ICD-10-CM

## 2022-09-21 DIAGNOSIS — Z85118 Personal history of other malignant neoplasm of bronchus and lung: Secondary | ICD-10-CM | POA: Insufficient documentation

## 2022-09-21 DIAGNOSIS — Z9221 Personal history of antineoplastic chemotherapy: Secondary | ICD-10-CM | POA: Insufficient documentation

## 2022-09-21 NOTE — Patient Instructions (Signed)
It was good to see you today.  I do not see or feel any evidence of cancer recurrence on your exam.  I will see you back in 12 months.  Please call sometime after the new year since her schedule is not out past December.  As always, if you develop any new and concerning symptoms before your next visit, please call to see me sooner.

## 2022-09-21 NOTE — Progress Notes (Signed)
Gynecologic Oncology Return Clinic Visit  09/21/22  Reason for Visit: Surveillance visit in the setting of recurrent uterine cancer   Treatment History: Oncology History Overview Note  Abnormal MSI, germline mutation was negative Endometrioid   Endometrial cancer (HCC)  09/15/2014 Pathology Results   Endometrium, curettage - ENDOMETRIAL ADENOCARCINOMA. - SEE COMMENT. Microscopic Comment Although definitive characterization is best performed on resection specimen, as sampled, the endometrial adenocarcinoma appears to be endometrioid subtype, FIGO grade 1.    09/15/2014 Surgery   PreOp: postmenopausal bleeding, cervical stenosis PostOp: same and uterine polyp Procedure:  Hysteroscopy, Dilation and Curettage, Myosure polypectomy Surgeon: Dr. Myna Hidalgo   Findings:8cm uterus with thickened polypoid-like endometrium, large 2cm irregular appearing polyp   Specimens: 1) endometrial curettings   10/26/2014 Pathology Results   1. Uterus +/- tubes/ovaries, neoplastic - INVASIVE ADENOCARCINOMA, ENDOMETRIOID TYPE, SEE COMMENT. - TUMOR INVOLVES LESS THAN ONE HALF MYOMETRIAL THICKNESS. - TUMOR INVOLVES UTERINE ADENOMYOSIS. - BENIGN LEIOMYOMATA (UP TO 2.5 CM). - BENIGN CERVICAL MUCOSA; NEGATIVE FOR INTRAEPITHELIAL LESION OR MALIGNANCY. - BENIGN RIGHT AND LEFT OVARIES; NEGATIVE FOR ATYPIA OR MALIGNANCY. - BENIGN RIGHT AND LEFT FALLOPIAN TUBE; NEGATIVE FOR ATYPIA OR MALIGNANCY. - BENIGN PARATUBAL CYSTS; NEGATIVE FOR ATYPIA OR MALIGNANCY. - SEE TUMOR SYNOPTIC TEMPLATE BELOW. 2. Lymph nodes, regional resection, right pelvic - FOUR LYMPH NODES, NEGATIVE FOR TUMOR (0/4). 3. Lymph nodes, regional resection, left pelvic - FOUR LYMPH NODES, NEGATIVE FOR TUMOR (0/4). Microscopic Comment 1. ONCOLOGY TABLE-UTERUS, CARCINOMA OR CARCINOSARCOMA Specimen: Uterus and bilateral fallopian tubes and ovaries. Procedure: Hysterectomy and bilateral salpingo-oophorectomy. Lymph node sampling performed:  Yes. Specimen integrity: Intact. Maximum tumor size: 4.0 cm (tumor involved entire endometrium) Histologic type: Adenocarcinoma, endometrioid type. Grade: 1 Myometrial invasion: 0.5 cm where myometrium is 1.5 cm in thickness Cervical stromal involvement: Absent. Extent of involvement of other organs: None Lymph - vascular invasion: Absent. Peritoneal washings: N/A Lymph nodes: # examined - 8 ; # positive - 0 Pelvic lymph nodes: N/A involved of N/A lymph nodes. Para-aortic lymph nodes: N/A involved of N/A lymph nodes. Other (specify involvement and site): N/A TNM code: pT1a, pN0 FIGO Stage (based on pathologic findings, needs clinical correlation): N/A Comments: None MSI testing abnormal   10/26/2014 Surgery   Surgery: Total robotic hysterectomy, bilateral salpingo-oophorectomy, bilateral pelvic lymph node dissection.    Surgeons:  Bernita Buffy. Duard Brady, MD; Antionette Char, MD      Pathology: Uterus, cervix, bilateral tubes and ovaries, bilateral pelvic lymph nodes to pathology.    Operative findings: Omentum adherent to midline vertical incision. Fibroid uterus. Normal adnexa. Adhesions of anterior bladder to uterus. Frozen section with grade 1 cancer focally involving adenomyosis, minimal myometrial invasion.   11/17/2014 Imaging   Ct abdomen 1. Status post hysterectomy. Pelvic edema which is greater than typically seen 3 weeks postop. Suspicious for postoperative infection. 2. Bladder wall thickening and irregularity. Although this could be partially due to underdistention, Concurrent cystitis cannot be excluded. 3. Bilateral fluid density lesions along the pelvic sidewalls. Favored to represent seromas or lymphangiomas. No specific features to suggest abscess. If the patient's symptoms persist after appropriate antibiotic therapy, aspiration of the largest left pelvic sidewall "Lesion" should be considered. 4. Suspicion of mild hepatic steatosis. Indeterminate right hepatic lobe  lesion. If definitive characterization is desired in this patient with history of primary malignancy, nonemergent pre and post contrast abdominal MRI could be performed. 5. Hiatal hernia.   11/25/2014 Imaging   1. Persistent bilateral pelvic sidewall fluid collections, left greater than right, with left-sided inflammatory  changes. 2. Technically successful 12 French left pelvic abscess drain catheter placement. A sample of the aspirate was sent for Gram stain, culture and sensitivity.   12/08/2014 Imaging   CT abdomen and pelvis 1. Complete resolution of dominant fluid collection within the left hemipelvis following percutaneous drainage catheter placement. This percutaneous drainage catheter was subsequent removed intact at the patient's bedside. 2. Continued decrease in size of bilobed fluid collections within the right hemipelvis with superficial component measuring 1.6 cm and posterior component measuring 2.6 cm, indeterminate though both favored to represent evolving seromas. 3. Unchanged indeterminate approximately 1.2 cm hepatic lesion. Further evaluation with contrast-enhanced abdominal MRI could be performed as clinically indicated. 4. Colonic diverticulosis without evidence of diverticulitis.   10/19/2016 Imaging   New 2.6 cm soft tissue mass involving right vaginal cuff, consistent with locally recurrent carcinoma.   No other sites of metastatic disease identified.   Colonic diverticulosis, without radiographic evidence of diverticulitis.   Mild hepatic steatosis.     10/24/2016 Relapse/Recurrence   Vaginal cuff recurrence. No evidence of distant disease.   10/24/2016 Pathology Results   Vagina, biopsy, right apex - ADENOCARCINOMA - SEE COMMENT Microscopic Comment The morphologic features are consistent with the patient's previously diagnosed endometrioid adenocarcinoma   11/08/2016 - 12/27/2016 Radiation Therapy   45 Gy in 25 fractions of 1.8 Gy. The residual pelvic mass was  boosted to 14 Gy in 7 fractions of 2 Gy. No intracavitary lesion seen at completion of pelvic fields to boost with brachytherapy.   06/12/2017 Imaging   CT abdomen and pelvis 1. New 1.1 by 0.8 cm right lower lobe pulmonary nodule, concerning for pulmonary metastatic disease. Possibilities for further assessment include biopsy or nuclear medicine PET-CT to assess this lesion and the rest of the neck/chest/abdomen/pelvis for other potential hypermetabolic lesions. 2. The previous soft tissue mass along the right vaginal cuff is no longer present. 3. Stable and likely benign right hepatic lobe hypodense lesion. 4. Other imaging findings of potential clinical significance: Sigmoid colon diverticulosis. Aortic Atherosclerosis (ICD10-I70.0). Multilevel lumbar degenerative disc disease. Mild cardiomegaly. Small type 1 hiatal hernia.   07/05/2017 Imaging   Ct chest Several bilateral pulmonary nodules measuring up to 11 mm, highly suspicious for pulmonary metastases.    No evidence of lymphadenopathy or pleural effusion.   Aortic Atherosclerosis (ICD10-I70.0).   07/25/2017 Imaging   Status post CT-guided lung nodule biopsy. Tissue specimen sent to pathology for complete histopathologic analysis.   07/25/2017 Pathology Results   Lung, needle/core biopsy(ies), RLL - METASTATIC ADENOCARCINOMA - SEE COMMENT Microscopic Comment By immunohistochemistry, the neoplastic cells are positive for Pax-8 and ER but negative for TTF-1. Overall, this immunoprofile is consistent with metastasis from the patient's known endometrioid adenocarcinoma   07/30/2017 Cancer Staging   Staging form: Corpus Uteri - Carcinoma and Carcinosarcoma, AJCC 8th Edition - Clinical: Stage IVB (rcT1, cN0, pM1) - Signed by Heath Lark, MD on 07/30/2017   08/09/2017 - 11/21/2017 Chemotherapy   The patient had pembrolizumab for chemotherapy treatment.    10/10/2017 Imaging   1. Interval decrease in size and contour. The of previously described  right and left lower lobe pulmonary nodules. 2. Interval increase in size of mediastinal and hilar adenopathy, potentially metastatic in etiology. 3. Aortic Atherosclerosis (ICD10-I70.0).   11/08/2017 - 11/10/2017 Hospital Admission   She was admitted to the hospital due to uncontrolled pain   11/29/2017 Imaging   Chest Impression:  No thoracic metastasis.  No discrete pulmonary nodules identified.  Abdomen / Pelvis Impression:  No evidence of local recurrence or metastatic endometrial carcinoma in the abdomen pelvis.   02/21/2018 Imaging   1. No findings to suggest metastatic disease in the chest, abdomen or pelvis. 2. Mild colonic diverticulosis without evidence of acute diverticulitis at this time. 3. Aortic atherosclerosis. 4. Additional incidental findings, as above.   03/10/2018 Imaging   Bone scan: No scintigraphic evidence of osseous metastatic disease.   05/22/2018 Imaging   1. Stable exam. No evidence of recurrent or metastatic carcinoma within the chest, abdomen, or pelvis. 2. Colonic diverticulosis, without radiographic evidence of diverticulitis.   11/13/2018 Imaging   1. No evidence of recurrent or metastatic carcinoma within the chest, abdomen, or pelvis. 2. Colonic diverticulosis, without radiographic evidence of diverticulitis. 3. Stable hepatic steatosis.   Aortic Atherosclerosis (ICD10-I70.0).   05/22/2019 Imaging   1. No evidence of recurrent or metastatic disease in the chest, abdomen, or pelvis status post hysterectomy. 2. Previously seen metastatic pulmonary nodules in the bilateral lower lobes remain resolved. 3. Unchanged irregular opacities of the peripheral right upper lobe, likely chronic sequelae of prior infection or inflammation. 4. Hepatic steatosis. 5. Sigmoid diverticulosis. 6. Aortic Atherosclerosis (ICD10-I70.0).     10/18/2019 Imaging   1. Heterogeneously enhancing lesion measuring 2.4 x 1.9 x 1.9 cm immediately to the right of the urethra  along the anterolateral aspect of the vaginal wall, likely centered in the right levator ani 9 musculature. This has a malignant appearance, but whether this represents a new primary lesion or a metastatic lesion is uncertain on today's examination. 2. Mild colonic diverticulosis.     11/02/2019 Imaging   CT chest 1. No evidence of metastatic disease. 2. Hepatic steatosis. 3.  Aortic atherosclerosis (ICD10-I70.0).   11/10/2019 - 11/27/2019 Radiation Therapy   Radiation Treatment Dates: 11/10/2019 through 11/27/2019 Site Technique Total Dose (Gy) Dose per Fx (Gy) Completed Fx Beam Energies  Perineum: Pelvis IMRT 28/28 2 14/14 6X      01/21/2020 Imaging   1. Complete resolution of the heterogeneously enhancing lesion seen along the right vaginal introitus on the prior study. No residual measurable lesion at this location today. No new or progressive findings in the pelvis/pelvic floor on today's exam. 2. Left colonic diverticulosis without features of diverticulitis.   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HCC)  07/30/2017 Initial Diagnosis   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HCC)   08/09/2017 - 11/01/2017 Chemotherapy   The patient had pembrolizumab (KEYTRUDA) 200 mg in sodium chloride 0.9 % 50 mL chemo infusion, 200 mg, Intravenous, Once, 5 of 7 cycles Administration: 200 mg (08/09/2017), 200 mg (08/30/2017), 200 mg (09/20/2017), 200 mg (10/11/2017), 200 mg (11/01/2017)  for chemotherapy treatment.    Metastasis to lung (HCC)  07/30/2017 Initial Diagnosis   Metastasis to lung (HCC)   08/09/2017 - 11/01/2017 Chemotherapy   The patient had pembrolizumab (KEYTRUDA) 200 mg in sodium chloride 0.9 % 50 mL chemo infusion, 200 mg, Intravenous, Once, 5 of 7 cycles Administration: 200 mg (08/09/2017), 200 mg (08/30/2017), 200 mg (09/20/2017), 200 mg (10/11/2017), 200 mg (11/01/2017)  for chemotherapy treatment.     MRI pelvis 04/16/22: performed in the setting of continued right pelvic pain IMPRESSION: 1. No evidence of  recurrent or metastatic disease in the pelvis. 2. Colonic diverticulosis without acute diverticulitis.  Interval History: Doing very well.  Has had complete resolution of her pelvic pain after pelvic floor physical therapy.  Denies any vaginal bleeding or discharge.  Reports baseline bowel bladder function.  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, recurrent    Anxiety    Arthritis    Carpal tunnel syndrome, bilateral upper limbs    Complication of anesthesia    Depression    Family history of uterine cancer    GERD (gastroesophageal reflux disease)    History of radiation therapy 11/08/2016-12/27/2016   pelvis 45 Gy in 25 fractions, pelvis boost 14 gy in 7 fractions   History of radiation therapy    endometrial - pelvis IMRT 11/10/2019-11/27/2019   Hypothyroidism    on synthroid    PONV (postoperative nausea and vomiting)     Past Surgical History:  Procedure Laterality Date   CARPAL TUNNEL RELEASE Bilateral 2020   CESAREAN SECTION     COLONOSCOPY     DIAGNOSTIC LAPAROSCOPY     fibroid tumors on her ovaries    DILATION AND CURETTAGE OF UTERUS     HAND SURGERY Right 2019   carpal tunnel   HYSTEROSCOPY WITH D & C N/A 09/15/2014   Procedure: DILATATION AND CURETTAGE /HYSTEROSCOPY/MYOSURE RESECTION OF POLYP;  Surgeon: Myna Hidalgo, DO;  Location: [facility];  Service: Gynecology;  Laterality: N/A;   KNEE SURGERY     LYMPH NODE DISSECTION N/A 10/26/2014   Procedure: POSS LYMPHADENECTOMY ;  Surgeon: Cleda Mccreedy, MD;  Location: [facility];  Service: Gynecology;  Laterality: N/A;   ROBOTIC ASSISTED TOTAL HYSTERECTOMY WITH BILATERAL SALPINGO OOPHERECTOMY Bilateral 10/26/2014   Procedure: ROBOTIC ASSISTED TOTAL HYSTERECTOMY WITH BILATERAL SALPINGO OOPHORECTOMY;  Surgeon: Cleda Mccreedy, MD;  Location: [facility];  Service: Gynecology;  Laterality: Bilateral;   TOTAL KNEE ARTHROPLASTY Left 04/15/2019   Procedure:  LEFT TOTAL KNEE ARTHROPLASTY;  Surgeon: Marcene Corning, MD;  Location: [facility];  Service: Orthopedics;  Laterality: Left;   TUBAL LIGATION     WISDOM TOOTH EXTRACTION      Family History  Problem Relation Age of Onset   Breast cancer Maternal Aunt        dx late 50's/60's, died in 52's   Uterine cancer Mother 25   Heart failure Mother    Dementia Father    Heart attack Paternal Grandfather 69   Other Sister 66       had abnromal finding on mammogram, unsure if cancer. Was told to 'remove part of of body'    Social History   Socioeconomic History   Marital status: Single    Spouse name: Not on file   Number of children: 2   Years of education: Not on file   Highest education level: Not on file  Occupational History   Occupation: customer service  Tobacco Use   Smoking status: Former    Packs/day: 0.50    Years: 13.00    Additional pack years: 0.00    Total pack years: 6.50    Types: Cigarettes    Quit date: 10/20/2007    Years since quitting: 14.9   Smokeless tobacco: Never  Vaping Use   Vaping Use: Never used  Substance and Sexual Activity   Alcohol use: Yes    Alcohol/week: 1.0 standard drink of alcohol    Types: 1 Cans of beer per week    Comment: every 2 to 3 months   Drug use: No   Sexual activity: Not Currently  Other Topics Concern   Not on file  Social History Narrative   Not on file   Social Determinants of Health   Financial Resource Strain: Not on file  Food Insecurity: Not  on file  Transportation Needs: Not on file  Physical Activity: Not on file  Stress: Not on file  Social Connections: Not on file    Current Medications:  Current Outpatient Medications:    albuterol (VENTOLIN HFA) 108 (90 Base) MCG/ACT inhaler, SMARTSIG:2 Puff(s) Via Inhaler 4 Times Daily PRN, Disp: , Rfl:    buPROPion (WELLBUTRIN SR) 150 MG 12 hr tablet, Take 150 mg by mouth 2 (two) times daily., Disp: , Rfl:    Cholecalciferol (VITAMIN D3) 25 MCG (1000 UT) CAPS, Take  1,000 Units by mouth daily., Disp: , Rfl:    FLUoxetine (PROZAC) 40 MG capsule, Take 40 mg by mouth daily. Per patient, not taken in several months, Disp: , Rfl:    furosemide (LASIX) 40 MG tablet, Take 40 mg by mouth 2 (two) times daily., Disp: , Rfl:    levothyroxine (SYNTHROID) 100 MCG tablet, Take 100 mcg by mouth daily before breakfast., Disp: , Rfl:    metoprolol succinate (TOPROL-XL) 25 MG 24 hr tablet, Take 0.5 tablets (12.5 mg total) by mouth daily., Disp: 45 tablet, Rfl: 3   rosuvastatin (CRESTOR) 10 MG tablet, TAKE 1 TABLET BY MOUTH EVERY DAY, Disp: 30 tablet, Rfl: 3  Review of Systems: Denies appetite changes, fevers, chills, fatigue, unexplained weight changes. Denies hearing loss, neck lumps or masses, mouth sores, ringing in ears or voice changes. Denies cough or wheezing.  Denies shortness of breath. Denies chest pain or palpitations. Denies leg swelling. Denies abdominal distention, pain, blood in stools, constipation, diarrhea, nausea, vomiting, or early satiety. Denies pain with intercourse, dysuria, frequency, hematuria or incontinence. Denies hot flashes, pelvic pain, vaginal bleeding or vaginal discharge.   Denies joint pain, back pain or muscle pain/cramps. Denies itching, rash, or wounds. Denies dizziness, headaches, numbness or seizures. Denies swollen lymph nodes or glands, denies easy bruising or bleeding. Denies anxiety, depression, confusion, or decreased concentration.  Physical Exam: BP 123/73 (BP Location: Left Arm, Patient Position: Sitting)   Pulse 69   Temp 97.9 F (36.6 C) (Oral)   Ht 5' 0.63" (1.54 m)   Wt 177 lb 9.6 oz (80.6 kg)   SpO2 99%   BMI 33.97 kg/m  General: Alert, oriented, no acute distress. HEENT: Normocephalic, atraumatic, sclera anicteric. Chest: Mild expiratory wheezing intermittently, otherwise lungs clear to auscultation bilaterally. Abdomen: Obese, soft, nontender.  Normoactive bowel sounds.  No masses or hepatosplenomegaly  appreciated.  Well-healed incisions. Extremities: Grossly normal range of motion.  Warm, well perfused.  No edema bilaterally. Skin: No rashes or lesions noted. Lymphatics: No cervical, supraclavicular, or inguinal adenopathy. GU: Normal appearing external genitalia without erythema, excoriation, or lesions.  Speculum exam reveals atrophic vaginal mucosa with radiation changes present.  No masses or atypical vascularity noted.  Bimanual exam reveals cuff intact, no nodularity or masses.  Patient has moderate pain with palpation along the right pubis/ischium.  I do not palpate any nodularity or masses in this location.  Laboratory & Radiologic Studies: None new  Assessment & Plan: Ashley Villegas is a 72 y.o. woman with recurrent low risk uterine cancer, initially with vaginal cuff recurrence in 2018, most recently treated for para-vaginal recurrence in 2021 with RT with complete disease resolution, presenting for surveillance visit.    She is doing very well today and is NED on exam.   She is almost 3 years out from completion of salvage radiation for paravaginal recurrence in 2021.  We will transition to visits every 6 months.  She sees Dr. Roselind Messier in December.  I will see her back next June. We also reviewed signs and symptoms that would be concerning for disease recurrence and the patient knows to call the clinic if she develops any of these before her next scheduled visit.  20 minutes of total time was spent for this patient encounter, including preparation, face-to-face counseling with the patient and coordination of care, and documentation of the encounter.  Eugene Garnet, MD  Division of Gynecologic Oncology  Department of Obstetrics and Gynecology  [facility]

## 2022-10-09 DIAGNOSIS — R635 Abnormal weight gain: Secondary | ICD-10-CM | POA: Diagnosis not present

## 2022-10-09 DIAGNOSIS — E039 Hypothyroidism, unspecified: Secondary | ICD-10-CM | POA: Diagnosis not present

## 2022-10-10 DIAGNOSIS — E88819 Insulin resistance, unspecified: Secondary | ICD-10-CM | POA: Diagnosis not present

## 2022-10-10 DIAGNOSIS — E039 Hypothyroidism, unspecified: Secondary | ICD-10-CM | POA: Diagnosis not present

## 2022-10-10 DIAGNOSIS — E7800 Pure hypercholesterolemia, unspecified: Secondary | ICD-10-CM | POA: Diagnosis not present

## 2022-10-10 DIAGNOSIS — R7303 Prediabetes: Secondary | ICD-10-CM | POA: Diagnosis not present

## 2022-10-10 DIAGNOSIS — R635 Abnormal weight gain: Secondary | ICD-10-CM | POA: Diagnosis not present

## 2022-10-19 ENCOUNTER — Telehealth: Payer: Self-pay | Admitting: Cardiology

## 2022-10-19 MED ORDER — METOPROLOL SUCCINATE ER 25 MG PO TB24: 12.5000 mg | ORAL_TABLET | Freq: Every day | ORAL | 0 refills | Status: DC

## 2022-10-19 NOTE — Telephone Encounter (Signed)
*  STAT* If patient is at the pharmacy, call can be transferred to refill team.   1. Which medications need to be refilled? (please list name of each medication and dose if known)   metoprolol succinate (TOPROL-XL) 25 MG 24 hr tablet    2. Which pharmacy/location (including street and city if local pharmacy) is medication to be sent to?  [id number] - [place], [place] - [address]      3. Do they need a 30 day or 90 day supply? 90 day     Pt is completely out of medication

## 2022-10-19 NOTE — Telephone Encounter (Signed)
Pt's medication was sent to pt's pharmacy as requested. Confirmation received.  °

## 2022-11-07 DIAGNOSIS — Z79899 Other long term (current) drug therapy: Secondary | ICD-10-CM | POA: Diagnosis not present

## 2022-11-07 DIAGNOSIS — E039 Hypothyroidism, unspecified: Secondary | ICD-10-CM | POA: Diagnosis not present

## 2022-11-07 DIAGNOSIS — Z8542 Personal history of malignant neoplasm of other parts of uterus: Secondary | ICD-10-CM | POA: Diagnosis not present

## 2022-11-07 DIAGNOSIS — R7303 Prediabetes: Secondary | ICD-10-CM | POA: Diagnosis not present

## 2022-11-07 DIAGNOSIS — I471 Supraventricular tachycardia, unspecified: Secondary | ICD-10-CM | POA: Diagnosis not present

## 2022-11-07 DIAGNOSIS — F330 Major depressive disorder, recurrent, mild: Secondary | ICD-10-CM | POA: Diagnosis not present

## 2022-11-07 DIAGNOSIS — Z1331 Encounter for screening for depression: Secondary | ICD-10-CM | POA: Diagnosis not present

## 2022-11-07 DIAGNOSIS — L989 Disorder of the skin and subcutaneous tissue, unspecified: Secondary | ICD-10-CM | POA: Diagnosis not present

## 2022-11-07 DIAGNOSIS — M8589 Other specified disorders of bone density and structure, multiple sites: Secondary | ICD-10-CM | POA: Diagnosis not present

## 2022-11-07 DIAGNOSIS — E7800 Pure hypercholesterolemia, unspecified: Secondary | ICD-10-CM | POA: Diagnosis not present

## 2022-11-07 DIAGNOSIS — Z0000 Encounter for general adult medical examination without abnormal findings: Secondary | ICD-10-CM | POA: Diagnosis not present

## 2022-11-08 ENCOUNTER — Other Ambulatory Visit: Payer: Self-pay | Admitting: Internal Medicine

## 2022-11-08 DIAGNOSIS — M8589 Other specified disorders of bone density and structure, multiple sites: Secondary | ICD-10-CM

## 2022-11-09 ENCOUNTER — Other Ambulatory Visit: Payer: Self-pay | Admitting: Internal Medicine

## 2022-11-09 DIAGNOSIS — Z122 Encounter for screening for malignant neoplasm of respiratory organs: Secondary | ICD-10-CM

## 2022-11-13 DIAGNOSIS — L820 Inflamed seborrheic keratosis: Secondary | ICD-10-CM | POA: Diagnosis not present

## 2022-11-13 DIAGNOSIS — L821 Other seborrheic keratosis: Secondary | ICD-10-CM | POA: Diagnosis not present

## 2022-11-17 ENCOUNTER — Other Ambulatory Visit: Payer: Self-pay | Admitting: Cardiology

## 2022-11-22 ENCOUNTER — Other Ambulatory Visit: Payer: Self-pay | Admitting: Cardiology

## 2022-12-04 ENCOUNTER — Ambulatory Visit
Admission: RE | Admit: 2022-12-04 | Discharge: 2022-12-04 | Disposition: A | Discharge status: Home / Self Care | Payer: Medicare Other | Source: Ambulatory Visit | Attending: Internal Medicine | Admitting: Internal Medicine

## 2022-12-04 DIAGNOSIS — Z122 Encounter for screening for malignant neoplasm of respiratory organs: Secondary | ICD-10-CM

## 2023-01-05 DIAGNOSIS — Z23 Encounter for immunization: Secondary | ICD-10-CM | POA: Diagnosis not present

## 2023-01-08 DIAGNOSIS — E039 Hypothyroidism, unspecified: Secondary | ICD-10-CM | POA: Diagnosis not present

## 2023-01-08 DIAGNOSIS — R7303 Prediabetes: Secondary | ICD-10-CM | POA: Diagnosis not present

## 2023-01-15 DIAGNOSIS — R635 Abnormal weight gain: Secondary | ICD-10-CM | POA: Diagnosis not present

## 2023-01-15 DIAGNOSIS — E039 Hypothyroidism, unspecified: Secondary | ICD-10-CM | POA: Diagnosis not present

## 2023-01-15 DIAGNOSIS — E7800 Pure hypercholesterolemia, unspecified: Secondary | ICD-10-CM | POA: Diagnosis not present

## 2023-01-15 DIAGNOSIS — R7303 Prediabetes: Secondary | ICD-10-CM | POA: Diagnosis not present

## 2023-01-15 DIAGNOSIS — E88819 Insulin resistance, unspecified: Secondary | ICD-10-CM | POA: Diagnosis not present

## 2023-02-02 DIAGNOSIS — Z23 Encounter for immunization: Secondary | ICD-10-CM | POA: Diagnosis not present

## 2023-02-15 ENCOUNTER — Emergency Department (HOSPITAL_COMMUNITY): Payer: Medicare Other

## 2023-02-15 ENCOUNTER — Other Ambulatory Visit: Payer: Self-pay

## 2023-02-15 ENCOUNTER — Emergency Department (HOSPITAL_COMMUNITY)
Admission: EM | Admit: 2023-02-15 | Discharge: 2023-02-15 | Disposition: A | Discharge status: Home / Self Care | Payer: Medicare Other | Attending: Emergency Medicine | Admitting: Emergency Medicine

## 2023-02-15 ENCOUNTER — Encounter (HOSPITAL_COMMUNITY): Payer: Self-pay

## 2023-02-15 DIAGNOSIS — M25511 Pain in right shoulder: Secondary | ICD-10-CM | POA: Diagnosis not present

## 2023-02-15 DIAGNOSIS — R29818 Other symptoms and signs involving the nervous system: Secondary | ICD-10-CM | POA: Diagnosis not present

## 2023-02-15 DIAGNOSIS — Z79899 Other long term (current) drug therapy: Secondary | ICD-10-CM | POA: Insufficient documentation

## 2023-02-15 DIAGNOSIS — R519 Headache, unspecified: Secondary | ICD-10-CM | POA: Insufficient documentation

## 2023-02-15 DIAGNOSIS — Z79890 Hormone replacement therapy (postmenopausal): Secondary | ICD-10-CM | POA: Diagnosis not present

## 2023-02-15 DIAGNOSIS — R42 Dizziness and giddiness: Secondary | ICD-10-CM | POA: Insufficient documentation

## 2023-02-15 DIAGNOSIS — R2990 Unspecified symptoms and signs involving the nervous system: Secondary | ICD-10-CM | POA: Diagnosis not present

## 2023-02-15 DIAGNOSIS — I10 Essential (primary) hypertension: Secondary | ICD-10-CM | POA: Diagnosis not present

## 2023-02-15 DIAGNOSIS — R531 Weakness: Secondary | ICD-10-CM | POA: Diagnosis not present

## 2023-02-15 DIAGNOSIS — E039 Hypothyroidism, unspecified: Secondary | ICD-10-CM | POA: Insufficient documentation

## 2023-02-15 LAB — CBC WITH DIFFERENTIAL/PLATELET
Abs Immature Granulocytes: 0.02 10*3/uL (ref 0.00–0.07)
Basophils Absolute: 0.1 10*3/uL (ref 0.0–0.1)
Basophils Relative: 2 %
Eosinophils Absolute: 0.3 10*3/uL (ref 0.0–0.5)
Eosinophils Relative: 6 %
HCT: 40.7 % (ref 36.0–46.0)
Hemoglobin: 13.5 g/dL (ref 12.0–15.0)
Immature Granulocytes: 0 %
Lymphocytes Relative: 15 %
Lymphs Abs: 0.8 10*3/uL (ref 0.7–4.0)
MCH: 31.1 pg (ref 26.0–34.0)
MCHC: 33.2 g/dL (ref 30.0–36.0)
MCV: 93.8 fL (ref 80.0–100.0)
Monocytes Absolute: 0.5 10*3/uL (ref 0.1–1.0)
Monocytes Relative: 9 %
Neutro Abs: 3.5 10*3/uL (ref 1.7–7.7)
Neutrophils Relative %: 68 %
Platelets: 223 10*3/uL (ref 150–400)
RBC: 4.34 MIL/uL (ref 3.87–5.11)
RDW: 13.3 % (ref 11.5–15.5)
WBC: 5.3 10*3/uL (ref 4.0–10.5)
nRBC: 0 % (ref 0.0–0.2)

## 2023-02-15 LAB — COMPREHENSIVE METABOLIC PANEL
ALT: 21 U/L (ref 0–44)
AST: 18 U/L (ref 15–41)
Albumin: 4 g/dL (ref 3.5–5.0)
Alkaline Phosphatase: 46 U/L (ref 38–126)
Anion gap: 10 (ref 5–15)
BUN: 11 mg/dL (ref 8–23)
CO2: 27 mmol/L (ref 22–32)
Calcium: 9.4 mg/dL (ref 8.9–10.3)
Chloride: 104 mmol/L (ref 98–111)
Creatinine, Ser: 0.75 mg/dL (ref 0.44–1.00)
GFR, Estimated: >60 mL/min (ref >60)
Glucose, Bld: 95 mg/dL (ref 70–99)
Potassium: 3.7 mmol/L (ref 3.5–5.1)
Sodium: 141 mmol/L (ref 135–145)
Total Bilirubin: 0.6 mg/dL (ref <1.2)
Total Protein: 7.1 g/dL (ref 6.5–8.1)

## 2023-02-15 LAB — CBG MONITORING, ED: Glucose-Capillary: 106 mg/dL — ABNORMAL HIGH (ref 70–99)

## 2023-02-15 MED ORDER — MECLIZINE HCL 25 MG PO TABS: 25.0000 mg | ORAL_TABLET | Freq: Three times a day (TID) | ORAL | 0 refills | Status: DC | PRN

## 2023-02-15 MED ORDER — IOHEXOL 350 MG/ML SOLN
80.0000 mL | Freq: Once | INTRAVENOUS | Status: AC | PRN
Start: 2023-02-15 — End: 2023-02-15
  Administered 2023-02-15: 80 mL via INTRAVENOUS

## 2023-02-15 MED ORDER — MECLIZINE HCL 25 MG PO TABS
25.0000 mg | ORAL_TABLET | Freq: Once | ORAL | Status: AC
  Administered 2023-02-15: 25 mg via ORAL
  Filled 2023-02-15: qty 1

## 2023-02-15 MED ORDER — ONDANSETRON HCL 4 MG PO TABS: 4.0000 mg | ORAL_TABLET | Freq: Four times a day (QID) | ORAL | 0 refills | Status: DC

## 2023-02-15 MED ORDER — ONDANSETRON HCL 4 MG/2ML IJ SOLN
4.0000 mg | Freq: Once | INTRAMUSCULAR | Status: AC
  Administered 2023-02-15: 4 mg via INTRAVENOUS
  Filled 2023-02-15: qty 2

## 2023-02-15 MED ORDER — LIDOCAINE 5 % EX PTCH: 1.0000 | MEDICATED_PATCH | Freq: Once | CUTANEOUS | Status: DC

## 2023-02-15 MED ORDER — ACETAMINOPHEN 500 MG PO TABS: 1000.0000 mg | ORAL_TABLET | Freq: Once | ORAL | Status: DC

## 2023-02-15 NOTE — ED Triage Notes (Addendum)
C/o headache, dizziness, nausea, and right sided dizziness x2 days.  Dizziness and nausea is worse with standing and moving head side to side.  Denies blood thinner usage. A&O x4

## 2023-02-15 NOTE — ED Provider Notes (Signed)
Patient seen after prior EDP.  Patient is now feeling much improved.  Workup including CT imaging of the brain was without acute abnormality.  Patient was offered additional workup - including MRI brain.  Patient reports that she feels much improved and desires discharge.  She declines this today and does not want to wait for MRI brain.    She does have established PCP.  She plans on following up with them on Monday.     Wynetta Fines, MD 02/15/23 [phone]

## 2023-02-15 NOTE — ED Provider Notes (Signed)
EMERGENCY DEPARTMENT AT [facility] Provider Note   CSN: [id number] Arrival date & time: 02/15/23  1414     History  Chief Complaint  Patient presents with   Dizziness    Ashley Villegas is a 72 y.o. female.  Patient is a 71 year old female with a past medical history of hypertension, hyperlipidemia, hypothyroidism presenting to the emergency department with dizziness and weakness.  The patient states that she has been feeling dizzy like "she is on a boat with associated nausea for the last 2 to 3 days.  She states that the symptoms occur with standing up and position changes and will resolve at rest.  She states that she also has associated weakness in her left arm and left leg.  She denies any associated numbness.  She states that she has been having pain in her right shoulder for about the past month but did not develop the weakness until the last couple days.  She states that she had no trauma or falls and no injury to her shoulder.  She reports a mild right-sided headache.  The history is provided by the patient.  Dizziness      Home Medications Prior to Admission medications   Medication Sig Start Date End Date Taking? Authorizing Provider  albuterol (VENTOLIN HFA) 108 (90 Base) MCG/ACT inhaler SMARTSIG:2 Puff(s) Via Inhaler 4 Times Daily PRN 11/21/20   [provider]  buPROPion (WELLBUTRIN SR) 150 MG 12 hr tablet Take 150 mg by mouth 2 (two) times daily.    [provider]  Cholecalciferol (VITAMIN D3) 25 MCG (1000 UT) CAPS Take 1,000 Units by mouth daily.    [provider]  FLUoxetine (PROZAC) 40 MG capsule Take 40 mg by mouth daily. Per patient, not taken in several months    [provider]  furosemide (LASIX) 40 MG tablet Take 40 mg by mouth 2 (two) times daily. 03/03/20   [provider]  levothyroxine (SYNTHROID) 100 MCG tablet Take 100 mcg by mouth daily before breakfast.    [provider]  metoprolol succinate (TOPROL-XL) 25 MG 24 hr tablet TAKE 1/2 TABLET BY MOUTH EVERY DAY 11/22/22   Tobb, Kardie, DO  rosuvastatin (CRESTOR) 10 MG tablet TAKE 1 TABLET BY MOUTH EVERY DAY 11/19/22   Tobb, Kardie, DO      Allergies    Oxycodone, Hydrocodone, and Hydrocodone-acetaminophen    Review of Systems   Review of Systems  Neurological:  Positive for dizziness.    Physical Exam Updated Vital Signs BP (!) 145/76 (BP Location: Left Arm)   Pulse 65   Temp 97.6 F (36.4 C) (Oral)   Resp (!) 21   Wt 80 kg   SpO2 98%   BMI 33.73 kg/m  Physical Exam Vitals and nursing note reviewed.  Constitutional:      General: She is not in acute distress.    Appearance: Normal appearance.  HENT:     Head: Normocephalic and atraumatic.     Nose: Nose normal.     Mouth/Throat:     Mouth: Mucous membranes are moist.     Pharynx: Oropharynx is clear.  Eyes:     Extraocular Movements: Extraocular movements intact.     Conjunctiva/sclera: Conjunctivae normal.     Pupils: Pupils are equal, round, and reactive to light.     Comments: No appreciable nystagmus but unable to hold eyes in any particular direction long enough due to symptoms to be able to evaluate for nystagmus  Neck:     Comments: R-sided cervical muscle and trapezius muscle tenderness to palpation Cardiovascular:     Rate and Rhythm: Normal rate and regular rhythm.     Pulses: Normal pulses.     Heart sounds: Normal heart sounds.  Pulmonary:     Effort: Pulmonary effort is normal.     Breath sounds: Normal breath sounds.  Abdominal:     General: Abdomen is flat.     Palpations: Abdomen is soft.     Tenderness: There is no abdominal tenderness.  Musculoskeletal:        General: Normal range of motion.     Cervical back: Normal range of motion and neck supple.  Skin:    General: Skin is warm and dry.  Neurological:     Mental Status: She is alert and oriented to person, place, and time.     Cranial Nerves: No cranial  nerve deficit.     Sensory: No sensory deficit.     Coordination: Coordination normal.     Comments: 4/5 strength in RUE though patient reports limited 2/2 pain, 5/5 strength in LUE and bilateral LE No drift in all 4 extremities, normal finger to nose bilaterally Normal speech   Psychiatric:        Mood and Affect: Mood normal.        Behavior: Behavior normal.     ED Results / Procedures / Treatments   Labs (all labs ordered are listed, but only abnormal results are displayed) Labs Reviewed  CBG MONITORING, ED - Abnormal; Notable for the following components:      Result Value   Glucose-Capillary 106 (*)    All other components within normal limits  COMPREHENSIVE METABOLIC PANEL  CBC WITH DIFFERENTIAL/PLATELET    EKG None  Radiology No results found.  Procedures Procedures    Medications Ordered in ED Medications  acetaminophen (TYLENOL) tablet 1,000 mg (1,000 mg Oral Not Given 02/15/23 1544)  lidocaine (LIDODERM) 5 % 1-3 patch ( Transdermal Not Given 02/15/23 1544)  meclizine (ANTIVERT) tablet 25 mg (25 mg Oral Given 02/15/23 1543)  ondansetron (ZOFRAN) injection 4 mg (4 mg Intravenous Given 02/15/23 1543)    ED Course/ Medical Decision Making/ A&P Clinical Course as of 02/15/23 1619  Fri Feb 15, 2023  1617 Labs within normal range. Patient signed out to Dr. Rodena Medin pending CT and reassessment with plan for possible MRI if no explanation of symptoms seen on imaging. [VK]    Clinical Course User Index [VK] Rexford Maus, DO                                 Medical Decision Making This patient presents to the ED with chief complaint(s) of dizziness, R-sided weakness with pertinent past medical history of HTN, HLD which further complicates the presenting complaint. The complaint involves an extensive differential diagnosis and also carries with it a high risk of complications and morbidity.    The differential diagnosis includes CVA, TIA, central versus  peripheral vertigo, dehydration, electrolyte abnormality, arrhythmia, anemia, muscle strain or spasm, fracture dislocation unlikely as no significant trauma to the shoulder  Additional history obtained: Additional history obtained from family Records reviewed Primary Care Documents  ED Course and Reassessment: On patient's arrival she is hemodynamically stable in no acute distress.  Accu-Chek on arrival was within normal range.  The patient has mild decrease strength on her right upper extremity though unclear if  secondary to pain, however no other obvious appreciable deficits however with her weakness associated with her dizziness, will have workup to evaluate for possible CVA as a cause of her symptoms.  She will be treated with Antivert and Zofran and will be closely reassessed.  Independent labs interpretation:  The following labs were independently interpreted: within normal range  Independent visualization of imaging: -Pending    Amount and/or Complexity of Data Reviewed Labs: ordered. Radiology: ordered.  Risk OTC drugs. Prescription drug management.          Final Clinical Impression(s) / ED Diagnoses Final diagnoses:  None    Rx / DC Orders ED Discharge Orders     None         Rexford Maus, DO 02/15/23 1619

## 2023-02-15 NOTE — ED Notes (Signed)
Ambulated pt with SpO2, pt stated that they felt fine. HR stayed between 66-83 and SpO2 stayed between 95% - 98%. No sign of complications.

## 2023-02-15 NOTE — Discharge Instructions (Addendum)
Return for any problem.  ?

## 2023-02-20 ENCOUNTER — Other Ambulatory Visit: Payer: Self-pay | Admitting: Internal Medicine

## 2023-02-20 DIAGNOSIS — R42 Dizziness and giddiness: Secondary | ICD-10-CM

## 2023-02-26 ENCOUNTER — Other Ambulatory Visit: Payer: Self-pay | Admitting: Internal Medicine

## 2023-02-26 ENCOUNTER — Other Ambulatory Visit: Payer: Self-pay | Admitting: Radiology

## 2023-02-26 DIAGNOSIS — R42 Dizziness and giddiness: Secondary | ICD-10-CM

## 2023-02-27 ENCOUNTER — Ambulatory Visit (HOSPITAL_COMMUNITY)
Admission: RE | Admit: 2023-02-27 | Discharge: 2023-02-27 | Disposition: A | Discharge status: Home / Self Care | Payer: Medicare Other | Source: Ambulatory Visit | Attending: Internal Medicine | Admitting: Internal Medicine

## 2023-02-27 DIAGNOSIS — R42 Dizziness and giddiness: Secondary | ICD-10-CM | POA: Diagnosis not present

## 2023-03-05 ENCOUNTER — Encounter: Payer: Self-pay | Admitting: Neurology

## 2023-03-06 ENCOUNTER — Other Ambulatory Visit: Payer: Self-pay

## 2023-03-06 ENCOUNTER — Ambulatory Visit: Payer: Medicare Other | Attending: Internal Medicine | Admitting: Physical Therapy

## 2023-03-06 DIAGNOSIS — R2689 Other abnormalities of gait and mobility: Secondary | ICD-10-CM | POA: Diagnosis not present

## 2023-03-06 DIAGNOSIS — R42 Dizziness and giddiness: Secondary | ICD-10-CM | POA: Diagnosis not present

## 2023-03-06 DIAGNOSIS — M6281 Muscle weakness (generalized): Secondary | ICD-10-CM | POA: Diagnosis not present

## 2023-03-06 DIAGNOSIS — M62838 Other muscle spasm: Secondary | ICD-10-CM | POA: Diagnosis not present

## 2023-03-06 DIAGNOSIS — R293 Abnormal posture: Secondary | ICD-10-CM | POA: Diagnosis not present

## 2023-03-06 NOTE — Therapy (Signed)
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, recurrent    Anxiety    Arthritis    Carpal tunnel syndrome, bilateral upper limbs    Complication of anesthesia    Depression    Family history of uterine cancer    GERD (gastroesophageal reflux disease)    History of radiation therapy 11/08/2016-12/27/2016   pelvis 45 Gy in 25 fractions, pelvis boost 14 gy in 7 fractions   History of radiation therapy    endometrial - pelvis IMRT 11/10/2019-11/27/2019   Hypothyroidism    on synthroid    PONV (postoperative nausea and vomiting)    Past Surgical History:  Procedure Laterality Date   CARPAL TUNNEL RELEASE Bilateral 2020   CESAREAN SECTION     COLONOSCOPY     DIAGNOSTIC LAPAROSCOPY     fibroid tumors on her ovaries    DILATION AND CURETTAGE OF UTERUS     HAND SURGERY Right 2019   carpal tunnel   HYSTEROSCOPY WITH D & C N/A 09/15/2014   Procedure: DILATATION AND CURETTAGE /HYSTEROSCOPY/MYOSURE RESECTION OF POLYP;  Surgeon: Myna Hidalgo, DO;  Location: [facility];  Service: Gynecology;  Laterality: N/A;   KNEE SURGERY     LYMPH NODE DISSECTION N/A 10/26/2014   Procedure: POSS LYMPHADENECTOMY ;  Surgeon: Cleda Mccreedy, MD;  Location: [facility];  Service: Gynecology;  Laterality: N/A;   ROBOTIC ASSISTED TOTAL HYSTERECTOMY WITH BILATERAL SALPINGO OOPHERECTOMY Bilateral 10/26/2014   Procedure: ROBOTIC ASSISTED TOTAL HYSTERECTOMY  WITH BILATERAL SALPINGO OOPHORECTOMY;  Surgeon: Cleda Mccreedy, MD;  Location: [facility];  Service: Gynecology;  Laterality: Bilateral;   TOTAL KNEE ARTHROPLASTY Left 04/15/2019   Procedure: LEFT TOTAL KNEE ARTHROPLASTY;  Surgeon: Marcene Corning, MD;  Location: [facility];  Service: Orthopedics;  Laterality: Left;   TUBAL LIGATION     WISDOM TOOTH EXTRACTION     Patient Active Problem List   Diagnosis Date Noted   Preventive measure 11/17/2019   Cancer associated pain 11/03/2019   Class II obesity 05/26/2019   Primary osteoarthritis of left knee 04/15/2019   Inflammatory arthritis 03/14/2018   Neuropathy 01/16/2018   Vitamin D deficiency 11/11/2017   Hypothyroid 11/09/2017   Total body pain 11/09/2017   Sinusitis 10/11/2017   Mediastinal lymphadenopathy 10/11/2017   Skin rash 10/11/2017   Genetic testing 08/29/2017   Hypothyroidism 08/08/2017   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   Depression 07/30/2017   Encounter for antineoplastic immunotherapy 07/30/2017   Metastasis to lung (HCC) 07/30/2017   Groin pain, right 07/30/2017   Family history of uterine cancer    Left groin pain    Endometrial cancer (HCC)     PCP: Thana Ates, MD REFERRING PROVIDER: Thana Ates, MD  REFERRING DIAG: R42 (ICD-10-CM) - Dizziness and giddiness  THERAPY DIAG:  Dizziness and giddiness  Other muscle spasm  Abnormal posture  Other abnormalities of gait and mobility  ONSET DATE: 4 weeks (02/13/23)  Rationale for Evaluation and Treatment: Rehabilitation  SUBJECTIVE:   SUBJECTIVE STATEMENT: Pt reports she had a bad set back yesterday. Feels like it's bad today too. Feels extremely dizzy. Pt states she first felt it while she was in [place]. After showering she bent over and got totally dizzy. Had to lay down. After half an hour she felt okay and able to walk and do everything. Two days after as she got back home, couldn't function in the house at all. Has had to hold on to the wall. Was  given medication for dizziness but feels it hasn't been working.  Pt accompanied by: family member  PERTINENT HISTORY:  Pt notes history of C3, C4, and C5 disc herniations and chronic neck pain 02/15/23 ED notes: past medical history of hypertension, hyperlipidemia, hypothyroidism presenting to the emergency department with dizziness and weakness   PAIN:  Are you having pain? Yes: NPRS scale: did not provide number/10 Pain location: neck Pain description: sharp/aching Aggravating factors: head movement Relieving factors: Rest, heat  PRECAUTIONS: Fall  RED FLAGS: None   WEIGHT BEARING RESTRICTIONS: No  FALLS: Has patient fallen in last 6 months? No  LIVING ENVIRONMENT: Lives with: lives with their son Lives in: House/apartment Stairs: No Has following equipment at home: None  PLOF: Independent  PATIENT GOALS: Improve dizziness  OBJECTIVE:  Note: Objective measures were completed at Evaluation unless otherwise noted.  DIAGNOSTIC FINDINGS: n/a  COGNITION: Overall cognitive status: Within functional limits for tasks assessed   SENSATION: WFL  PALPATION:  Increased muscle tone and trigger points notable along L>R upper trap, cervical paraspinals, suboccipitals  DTRs:  Did not assess  POSTURE:  rounded shoulders, forward head, and increased thoracic kyphosis  Cervical ROM:  very slow and guarded with all motion  Active A/PROM (deg) eval  Flexion 100% * (not new)  Extension 100%  Right lateral flexion 80%  Left lateral flexion 60%  Right rotation 100%  Left rotation 50%*  (Blank rows = not tested, * = pain)  STRENGTH: Did not assess UEs today  LOWER EXTREMITY MMT:   MMT Right eval Left eval  Hip flexion    Hip abduction    Hip adduction    Hip internal rotation    Hip external rotation    Knee flexion    Knee extension    Ankle dorsiflexion    Ankle plantarflexion    Ankle inversion    Ankle eversion    (Blank rows = not tested)  BED  MOBILITY:  CGA for safety due to dizziness Slow and guarded with all movements for sidelying<>sit  TRANSFERS: Assistive device utilized: None  Sit to stand: Complete Independence Stand to sit: Complete Independence Chair to chair: Complete Independence Floor: did not assess  GAIT: Gait pattern: WFL, step through pattern, and wide BOS Distance walked: Into clinic Assistive device utilized: None Level of assistance: SBA for safety Comments: Slow and guarded with movements; hands in high guard position  FUNCTIONAL TESTS:  5 times sit to stand: TBA Functional gait assessment: TBA  PATIENT SURVEYS:  FOTO 39; predicted 55  VESTIBULAR ASSESSMENT:  GENERAL OBSERVATION: Guards from cervical motion   SYMPTOM BEHAVIOR:  Subjective history: Reports neck pain and dizziness occur together (days of dizziness she did not have neck pain)  Non-Vestibular symptoms: neck pain, headaches, tinnitus, and nausea/vomiting  Type of dizziness: Blurred Vision  and spinning, diplopia, "my head is floating"  Frequency: every day until last Friday -- currently at least 2-3x/wk   Duration: all day  Aggravating factors: Induced by position change: sit to stand and Induced by motion: bending down to the ground, turning body quickly, and turning head quickly  Relieving factors: lying supine and rest  Progression of symptoms: unchanged  OCULOMOTOR EXAM:  Ocular Alignment: normal  Ocular ROM: No Limitations  Spontaneous Nystagmus: absent  Gaze-Induced Nystagmus: absent  Smooth Pursuits: intact but reports increase dizziness with looking left  Saccades: slow; difficulty focusing with hypometric movement with looking down  Convergence/Divergence: WNL  FRENZEL - FIXATION SUPRESSED: Did not assess  VESTIBULAR - OCULAR REFLEX:   Slow VOR: Normal; pulls on neck and increases dizziness  VOR Cancellation: Comment: Unable to turn head to the left with increased dizziness  Head-Impulse Test: HIT Right:  negative HIT Left: difficulty assessing due to pt guarding  Dynamic Visual Acuity: Not able to be assessed   POSITIONAL TESTING: Right Sidelying: no nystagmus Left Sidelying: no nystagmus Other: Pt unable to tolerate Dix-Hallpike position due to her limited neck motion; no nystagmus or symptoms noticeable with sidelying Dix-Hallpike  MOTION SENSITIVITY:  Motion Sensitivity Quotient Intensity: 0 = none, 1 = Lightheaded, 2 = Mild, 3 = Moderate, 4 = Severe, 5 = Vomiting  Intensity  1. Sitting to supine   2. Supine to L side   3. Supine to R side   4. Supine to sitting   5. L Hallpike-Dix   6. Up from L    7. R Hallpike-Dix   8. Up from R    9. Sitting, head tipped to L knee   10. Head up from L knee   11. Sitting, head tipped to R knee   12. Head up from R knee   13. Sitting head turns x5   14.Sitting head nods x5   15. In stance, 180 turn to L    16. In stance, 180 turn to R     OTHOSTATICS: not done   VESTIBULAR TREATMENT:                                                                                                   DATE: 03/06/23 Therex: See HEP below Manual therapy:  STM & TPR L UT and cervical paraspinal  Skilled assessment and palpation for TPDN  Trigger Point Dry-Needling  Treatment instructions: Expect mild to moderate muscle soreness. S/S of pneumothorax if dry needled over a lung field, and to seek immediate medical attention should they occur. Patient verbalized understanding of these instructions and education.  Patient Consent Given: Yes Education handout provided: Yes Muscles treated: L UT, lower cervical paraspinal Electrical stimulation performed: No Parameters: N/A Treatment response/outcome: Twitch response, decreased muscle tension, increased cervical ROM by end of session Modalities:  Moist heat x10 with premod electrotherapy along UT, intensity to pt's tolerance   PATIENT EDUCATION: Education details: Exam findings, POC, initial HEP Person  educated: Patient Education method: Explanation, Demonstration, and Handouts Education comprehension: verbalized understanding, returned demonstration, and needs further education  HOME EXERCISE PROGRAM: Access Code: [email or web address] URL: [email or web address] Date: 03/06/2023 Prepared by: Vernon Prey April Kirstie Peri  Exercises - Seated Shoulder Rolls  - 1 x daily - 7 x weekly - 1 sets - 10 reps - Gentle Levator Scapulae Stretch  - 1 x daily - 7 x weekly - 2 sets - 30 sec hold - Seated Upper Trapezius Stretch (Mirrored)  - 1 x daily - 7 x weekly - 2 sets - 30 sec hold - Seated Cervical Rotation AROM  - 1 x daily - 7 x weekly - 2 sets - 30 sec hold  GOALS: Goals reviewed with patient? Yes  SHORT TERM GOALS: Target date: 04/03/2023    Pt will be ind with initial HEP Baseline: Goal status: INITIAL  2.  Pt will demo L = R cervical ROM Baseline:  Goal status: INITIAL  3.  Pt will report decrease in neck pain and headache by >/=50% for improved muscle guarding Baseline:  Goal status: INITIAL  4.  Pt will report decrease in dizziness by >/=50% Baseline:  Goal status: INITIAL  5. Pt will be able to tolerate DGI or FGA to obtain dynamic balance score  Baseline: Unable to assess on eval due to pt guarding  Goal status: INITIAL   LONG TERM GOALS: Target date: 05/01/2023   Pt will be ind with management and progression of HEP Baseline:  Goal status: INITIAL  2.  Pt will be able to perform all cervical and oculomotor movements without c/o dizziness Baseline:  Goal status: INITIAL  3.  Pt will have improved DGI by at least 3 points to demo MCID, or FGA by at least 6 points to demo MDC Baseline:  Goal status: INITIAL  4.  Pt will report full resolution of her dizziness Baseline:  Goal status: INITIAL  5.  Pt will have improved FOTO to >/=55 Baseline:  Goal status: INITIAL   ASSESSMENT:  CLINICAL IMPRESSION: Patient is a 72 y.o. F who was seen today for physical  therapy evaluation and treatment for dizziness. PMH significant for cervical disc herniations. Assessment significant for very reduced cervical ROM (L worse than R), increased cervical muscle spasm, and increased symptoms with both cervical and oculomotor movements indicating s/s of likely cervicogenic dizziness. Pt would highly benefit from PT to address these issues to decrease her muscle guarding, return use of her vestibular system to decrease her dizziness and improve overall mobility and balance. Performed trial of TPDN this session.   OBJECTIVE IMPAIRMENTS: decreased activity tolerance, decreased balance, decreased coordination, decreased endurance, decreased mobility, decreased ROM, decreased strength, dizziness, hypomobility, increased fascial restrictions, increased muscle spasms, impaired flexibility, impaired UE functional use, improper body mechanics, postural dysfunction, and pain.   ACTIVITY LIMITATIONS: bending, sitting, standing, squatting, sleeping, stairs, transfers, bed mobility, and locomotion level  PARTICIPATION LIMITATIONS: meal prep, cleaning, laundry, driving, shopping, and community activity  PERSONAL FACTORS: Age, Fitness, Past/current experiences, and Time since onset of injury/illness/exacerbation are also affecting patient's functional outcome.   REHAB POTENTIAL: Good  CLINICAL DECISION MAKING: Evolving/moderate complexity  EVALUATION COMPLEXITY: Moderate   PLAN:  PT FREQUENCY: 2x/week  PT DURATION: 6 weeks  PLANNED INTERVENTIONS: 97164- PT Re-evaluation, 97110-Therapeutic exercises, 97530- Therapeutic activity, [id number]- Neuromuscular re-education, 97535- Self Care, [id number]- Manual therapy, [id number]- Gait training, [phone]- Canalith repositioning, 97014- Electrical stimulation (unattended), [phone]- Ionotophoresis 4mg /ml Dexamethasone, Patient/Family education, Balance training, Stair training, Taping, Dry Needling, Joint mobilization, Spinal mobilization, Vestibular  training, Cryotherapy, and Moist heat  PLAN FOR NEXT SESSION: Assess response to HEP and  dry needling. Manual work/TPDN as indicated. Work on cervical ROM/strengthening if indicated. Check for BPPV if pt has more neck ROM to tolerate. Assess DGI or FGA. Initiate gaze stabilization and habituation exercises   Donya Tomaro April Ma L Calistro Rauf, PT 03/06/2023, 1:01 PM

## 2023-03-11 ENCOUNTER — Ambulatory Visit: Payer: Medicare Other | Admitting: Physical Therapy

## 2023-03-11 DIAGNOSIS — R2689 Other abnormalities of gait and mobility: Secondary | ICD-10-CM

## 2023-03-11 DIAGNOSIS — R293 Abnormal posture: Secondary | ICD-10-CM

## 2023-03-11 DIAGNOSIS — M6281 Muscle weakness (generalized): Secondary | ICD-10-CM | POA: Diagnosis not present

## 2023-03-11 DIAGNOSIS — M62838 Other muscle spasm: Secondary | ICD-10-CM

## 2023-03-11 DIAGNOSIS — R42 Dizziness and giddiness: Secondary | ICD-10-CM

## 2023-03-11 NOTE — Therapy (Signed)
OUTPATIENT PHYSICAL THERAPY VESTIBULAR TREATMENT   Patient Name: Ashley Villegas MRN: [id number] DOB:24-Apr-1950, 72 y.o., female Today's Date: 03/11/2023  END OF SESSION:  PT End of Session - 03/11/23 0854     Visit Number 2    Number of Visits 12    Date for PT Re-Evaluation 04/17/23    Authorization Type Medicare    Progress Note Due on Visit 10    PT Start Time [phone]   pt arrived late   PT Stop Time 0930    PT Time Calculation (min) 38 min    Activity Tolerance Patient limited by pain    Behavior During Therapy [facility] for tasks assessed/performed              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, recurrent    Anxiety    Arthritis    Carpal tunnel syndrome, bilateral upper limbs    Complication of anesthesia    Depression    Family history of uterine cancer    GERD (gastroesophageal reflux disease)    History of radiation therapy 11/08/2016-12/27/2016   pelvis 45 Gy in 25 fractions, pelvis boost 14 gy in 7 fractions   History of radiation therapy    endometrial - pelvis IMRT 11/10/2019-11/27/2019   Hypothyroidism    on synthroid    PONV (postoperative nausea and vomiting)    Past Surgical History:  Procedure Laterality Date   CARPAL TUNNEL RELEASE Bilateral 2020   CESAREAN SECTION     COLONOSCOPY     DIAGNOSTIC LAPAROSCOPY     fibroid tumors on her ovaries    DILATION AND CURETTAGE OF UTERUS     HAND SURGERY Right 2019   carpal tunnel   HYSTEROSCOPY WITH D & C N/A 09/15/2014   Procedure: DILATATION AND CURETTAGE /HYSTEROSCOPY/MYOSURE RESECTION OF POLYP;  Surgeon: Myna Hidalgo, DO;  Location: [facility];  Service: Gynecology;  Laterality: N/A;   KNEE SURGERY     LYMPH NODE DISSECTION N/A 10/26/2014   Procedure: POSS LYMPHADENECTOMY ;  Surgeon: Cleda Mccreedy, MD;  Location: [facility];  Service: Gynecology;  Laterality: N/A;   ROBOTIC ASSISTED TOTAL HYSTERECTOMY WITH BILATERAL SALPINGO OOPHERECTOMY  Bilateral 10/26/2014   Procedure: ROBOTIC ASSISTED TOTAL HYSTERECTOMY WITH BILATERAL SALPINGO OOPHORECTOMY;  Surgeon: Cleda Mccreedy, MD;  Location: [facility];  Service: Gynecology;  Laterality: Bilateral;   TOTAL KNEE ARTHROPLASTY Left 04/15/2019   Procedure: LEFT TOTAL KNEE ARTHROPLASTY;  Surgeon: Marcene Corning, MD;  Location: [facility];  Service: Orthopedics;  Laterality: Left;   TUBAL LIGATION     WISDOM TOOTH EXTRACTION     Patient Active Problem List   Diagnosis Date Noted   Preventive measure 11/17/2019   Cancer associated pain 11/03/2019   Class II obesity 05/26/2019   Primary osteoarthritis of left knee 04/15/2019   Inflammatory arthritis 03/14/2018   Neuropathy 01/16/2018   Vitamin D deficiency 11/11/2017   Hypothyroid 11/09/2017   Total body pain 11/09/2017   Sinusitis 10/11/2017   Mediastinal lymphadenopathy 10/11/2017   Skin rash 10/11/2017   Genetic testing 08/29/2017   Hypothyroidism 08/08/2017   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   Depression 07/30/2017   Encounter for antineoplastic immunotherapy 07/30/2017   Metastasis to lung (HCC) 07/30/2017   Groin pain, right 07/30/2017   Family history of uterine cancer    Left groin pain    Endometrial cancer (HCC)     PCP: Thana Ates, MD REFERRING PROVIDER: Hillard Danker  P, MD  REFERRING DIAG: R42 (ICD-10-CM) - Dizziness and giddiness  THERAPY DIAG:  Dizziness and giddiness  Other muscle spasm  Abnormal posture  Other abnormalities of gait and mobility  Muscle weakness (generalized)  ONSET DATE: 4 weeks (02/13/23)  Rationale for Evaluation and Treatment: Rehabilitation  SUBJECTIVE:   SUBJECTIVE STATEMENT: Pt with ongoing headache today, reports they are pretty constant, ibuprofen helps reduce it. Everything aggravates headaches (lights, sounds, possibly weather).  Pt felt "so good" after DN last session, it reduced her pain and took it away, pain has since returned (L back side of her  neck).  Headache: 8/10 Neck pain: 2/10  Pt reports that her exercises are going well so far, does them first thing in the morning and then before bed, feels like they are helpful. Pt also took some medication for her dizziness this morning.   Pt accompanied by: family member  PERTINENT HISTORY:  Pt notes history of C3, C4, and C5 disc herniations and chronic neck pain 02/15/23 ED notes: past medical history of hypertension, hyperlipidemia, hypothyroidism presenting to the emergency department with dizziness and weakness   PAIN:  Are you having pain? Yes: NPRS scale: 2 to 8/10 Pain location: neck, headache Pain description: sharp/aching Aggravating factors: head movement, light, sounds Relieving factors: Rest, heat, ibuprofen  PRECAUTIONS: Fall  RED FLAGS: None   WEIGHT BEARING RESTRICTIONS: No  FALLS: Has patient fallen in last 6 months? No  LIVING ENVIRONMENT: Lives with: lives with their son Lives in: House/apartment Stairs: No Has following equipment at home: None  PLOF: Independent  PATIENT GOALS: Improve dizziness  OBJECTIVE:  Note: Objective measures were completed at Evaluation unless otherwise noted.  DIAGNOSTIC FINDINGS: n/a  COGNITION: Overall cognitive status: Within functional limits for tasks assessed   SENSATION: WFL  PALPATION:  Increased muscle tone and trigger points notable along L>R upper trap, cervical paraspinals, suboccipitals  DTRs:  Did not assess  POSTURE:  rounded shoulders, forward head, and increased thoracic kyphosis  Cervical ROM:  very slow and guarded with all motion  Active A/PROM (deg) eval  Flexion 100% * (not new)  Extension 100%  Right lateral flexion 80%  Left lateral flexion 60%  Right rotation 100%  Left rotation 50%*  (Blank rows = not tested, * = pain)  STRENGTH: Did not assess UEs today  LOWER EXTREMITY MMT:   MMT Right eval Left eval  Hip flexion    Hip abduction    Hip adduction    Hip  internal rotation    Hip external rotation    Knee flexion    Knee extension    Ankle dorsiflexion    Ankle plantarflexion    Ankle inversion    Ankle eversion    (Blank rows = not tested)  BED MOBILITY:  CGA for safety due to dizziness Slow and guarded with all movements for sidelying<>sit  TRANSFERS: Assistive device utilized: None  Sit to stand: Complete Independence Stand to sit: Complete Independence Chair to chair: Complete Independence Floor: did not assess  GAIT: Gait pattern: WFL, step through pattern, and wide BOS Distance walked: Into clinic Assistive device utilized: None Level of assistance: SBA for safety Comments: Slow and guarded with movements; hands in high guard position  FUNCTIONAL TESTS:  5 times sit to stand: TBA Functional gait assessment: TBA  PATIENT SURVEYS:  FOTO 39; predicted 55  VESTIBULAR ASSESSMENT:  GENERAL OBSERVATION: Guards from cervical motion   SYMPTOM BEHAVIOR:  Subjective history: Reports neck pain and dizziness occur together (  days of dizziness she did not have neck pain)  Non-Vestibular symptoms: neck pain, headaches, tinnitus, and nausea/vomiting  Type of dizziness: Blurred Vision and spinning, diplopia, "my head is floating"  Frequency: every day until last Friday -- currently at least 2-3x/wk   Duration: all day  Aggravating factors: Induced by position change: sit to stand and Induced by motion: bending down to the ground, turning body quickly, and turning head quickly  Relieving factors: lying supine and rest  Progression of symptoms: unchanged  OCULOMOTOR EXAM:  Ocular Alignment: normal  Ocular ROM: No Limitations  Spontaneous Nystagmus: absent  Gaze-Induced Nystagmus: absent  Smooth Pursuits: intact but reports increase dizziness with looking left  Saccades: slow; difficulty focusing with hypometric movement with looking down  Convergence/Divergence: WNL  FRENZEL - FIXATION SUPRESSED: Did not  assess  VESTIBULAR - OCULAR REFLEX:   Slow VOR: Normal; pulls on neck and increases dizziness  VOR Cancellation: Comment: Unable to turn head to the left with increased dizziness  Head-Impulse Test: HIT Right: negative HIT Left: difficulty assessing due to pt guarding  Dynamic Visual Acuity: Not able to be assessed   POSITIONAL TESTING: Right Sidelying: no nystagmus Left Sidelying: no nystagmus Other: Pt unable to tolerate Dix-Hallpike position due to her limited neck motion; no nystagmus or symptoms noticeable with sidelying Dix-Hallpike  MOTION SENSITIVITY:  Motion Sensitivity Quotient Intensity: 0 = none, 1 = Lightheaded, 2 = Mild, 3 = Moderate, 4 = Severe, 5 = Vomiting  Intensity  1. Sitting to supine   2. Supine to L side   3. Supine to R side   4. Supine to sitting   5. L Hallpike-Dix   6. Up from L    7. R Hallpike-Dix   8. Up from R    9. Sitting, head tipped to L knee   10. Head up from L knee   11. Sitting, head tipped to R knee   12. Head up from R knee   13. Sitting head turns x5   14.Sitting head nods x5   15. In stance, 180 turn to L    16. In stance, 180 turn to R     OTHOSTATICS: not done   TREATMENT:                                                                                                     TherEx Supine suboccipital release with tennis balls x 5 min  Seated cervical rotation stretch with towel Increase in headache and dizziness with rotation to the L, deferred further repetitions this direction X 30 sec to the R with no increase in pain or symptoms  Added to HEP, see bolded below   TherAct  [facility] PT Assessment - 03/11/23 0916       Functional Gait  Assessment   Gait assessed  Yes    Gait Level Surface Walks 20 ft, slow speed, abnormal gait pattern, evidence for imbalance or deviates 10-15 in outside of the 12 in walkway width. Requires more than 7 sec to ambulate 20 ft.    Change in Gait  Speed Makes only minor adjustments to walking  speed, or accomplishes a change in speed with significant gait deviations, deviates 10-15 in outside the 12 in walkway width, or changes speed but loses balance but is able to recover and continue walking.    Gait with Horizontal Head Turns Performs head turns smoothly with slight change in gait velocity (eg, minor disruption to smooth gait path), deviates 6-10 in outside 12 in walkway width, or uses an assistive device.    Gait with Vertical Head Turns Performs task with slight change in gait velocity (eg, minor disruption to smooth gait path), deviates 6 - 10 in outside 12 in walkway width or uses assistive device    Gait and Pivot Turn Pivot turns safely in greater than 3 sec and stops with no loss of balance, or pivot turns safely within 3 sec and stops with mild imbalance, requires small steps to catch balance.    Step Over Obstacle Is able to step over one shoe box (4.5 in total height) but must slow down and adjust steps to clear box safely. May require verbal cueing.    Gait with Narrow Base of Support Ambulates less than 4 steps heel to toe or cannot perform without assistance.    Gait with Eyes Closed Cannot walk 20 ft without assistance, severe gait deviations or imbalance, deviates greater than 15 in outside 12 in walkway width or will not attempt task.    Ambulating Backwards Cannot walk 20 ft without assistance, severe gait deviations or imbalance, deviates greater than 15 in outside 12 in walkway width or will not attempt task.    Steps Alternating feet, must use rail.    Total Score 11    FGA comment: 11/30, high fall risk            Trigger Point Dry-Needling  Treatment instructions: Expect mild to moderate muscle soreness. S/S of pneumothorax if dry needled over a lung field, and to seek immediate medical attention should they occur. Patient verbalized understanding of these instructions and education.  Patient Consent Given: Yes Education handout provided: Previously  provided Muscles treated: L upper trap Treatment response/outcome: deep ache/pressure; fair tolerance to treatment and significant TTP in L splenius muscles so deferred further needling this date   PATIENT EDUCATION: Education details: continue HEP and added to HEP Person educated: Patient Education method: Explanation, Demonstration, and Handouts Education comprehension: verbalized understanding, returned demonstration, and needs further education  HOME EXERCISE PROGRAM: Access Code: [email or web address] URL: [email or web address] Date: 03/06/2023 Prepared by: Vernon Prey April Kirstie Peri  Exercises - Seated Shoulder Rolls  - 1 x daily - 7 x weekly - 1 sets - 10 reps - Gentle Levator Scapulae Stretch  - 1 x daily - 7 x weekly - 2 sets - 30 sec hold - Seated Upper Trapezius Stretch (Mirrored)  - 1 x daily - 7 x weekly - 2 sets - 30 sec hold - Seated Cervical Rotation AROM  - 1 x daily - 7 x weekly - 2 sets - 30 sec hold - Supine Suboccipital Release with Tennis Balls  - 1 x daily - 7 x weekly - 1 sets - 1 reps - 5-10 min hold - Seated Assisted Cervical Rotation with Towel (to R side only)  - 1 x daily - 7 x weekly - 1 sets - 3-5 reps - 30 sec hold   GOALS: Goals reviewed with patient? Yes  SHORT TERM GOALS: Target date: 04/03/2023    Pt will be ind with initial  HEP Baseline: Goal status: INITIAL  2.  Pt will demo L = R cervical ROM Baseline:  Goal status: INITIAL  3.  Pt will report decrease in neck pain and headache by >/=50% for improved muscle guarding Baseline:  Goal status: INITIAL  4.  Pt will report decrease in dizziness by >/=50% Baseline:  Goal status: INITIAL  5. Pt will be able to tolerate DGI or FGA to obtain dynamic balance score  Baseline: Unable to assess on eval due to pt guarding, assessed 12/16 and LTG set  Goal status: MET   LONG TERM GOALS: Target date: 05/01/2023   Pt will be ind with management and progression of HEP Baseline:  Goal status:  INITIAL  2.  Pt will be able to perform all cervical and oculomotor movements without c/o dizziness Baseline:  Goal status: INITIAL  3.  Pt will improve FGA to 17/30 for decreased fall risk  Baseline: 11/30 (12/16) Goal status: INITIAL  4.  Pt will report full resolution of her dizziness Baseline:  Goal status: INITIAL  5.  Pt will have improved FOTO to >/=55 Baseline:  Goal status: INITIAL   ASSESSMENT:  CLINICAL IMPRESSION: Emphasis of skilled PT session on assessing FGA and setting LTG, performing TPDN to address ongoing muscle pain and tightness, and adding exercises to HEP to address muscle pain and tightness. Pt a high fall risk based on her score of 11/30 on FGA, she exhibits the most difficulty with gait with eyes closed, backwards, and tandem gait. Educated patient on body systems contributing to balance and reasoning behind her score on this test. Pt with fair tolerance to TPDN this date and with increased sensitivity in her L cervical paraspinal muscles. Pt continues to benefit from skilled therapy services to increase her independence with management of her pain and dizziness symptoms. Continue POC.   OBJECTIVE IMPAIRMENTS: decreased activity tolerance, decreased balance, decreased coordination, decreased endurance, decreased mobility, decreased ROM, decreased strength, dizziness, hypomobility, increased fascial restrictions, increased muscle spasms, impaired flexibility, impaired UE functional use, improper body mechanics, postural dysfunction, and pain.   ACTIVITY LIMITATIONS: bending, sitting, standing, squatting, sleeping, stairs, transfers, bed mobility, and locomotion level  PARTICIPATION LIMITATIONS: meal prep, cleaning, laundry, driving, shopping, and community activity  PERSONAL FACTORS: Age, Fitness, Past/current experiences, and Time since onset of injury/illness/exacerbation are also affecting patient's functional outcome.   REHAB POTENTIAL: Good  CLINICAL  DECISION MAKING: Evolving/moderate complexity  EVALUATION COMPLEXITY: Moderate   PLAN:  PT FREQUENCY: 2x/week  PT DURATION: 6 weeks  PLANNED INTERVENTIONS: 97164- PT Re-evaluation, 97110-Therapeutic exercises, 97530- Therapeutic activity, [id number]- Neuromuscular re-education, 97535- Self Care, 16109- Manual therapy, [id number]- Gait training, [phone]- Canalith repositioning, 97014- Electrical stimulation (unattended), [phone]- Ionotophoresis 4mg /ml Dexamethasone, Patient/Family education, Balance training, Stair training, Taping, Dry Needling, Joint mobilization, Spinal mobilization, Vestibular training, Cryotherapy, and Moist heat  PLAN FOR NEXT SESSION: Assess response to HEP and dry needling. Manual work/TPDN as indicated. Work on cervical ROM/strengthening if indicated. Check for BPPV if pt has more neck ROM to tolerate. Assess DGI or FGA. Initiate gaze stabilization and habituation exercises   Peter Congo, PT Peter Congo, PT, DPT, CSRS  03/11/2023, 9:33 AM

## 2023-03-13 ENCOUNTER — Other Ambulatory Visit: Payer: Self-pay | Admitting: Cardiology

## 2023-03-15 ENCOUNTER — Other Ambulatory Visit: Payer: Medicare Other

## 2023-03-15 ENCOUNTER — Ambulatory Visit: Payer: Medicare Other | Admitting: Physical Therapy

## 2023-03-15 DIAGNOSIS — M62838 Other muscle spasm: Secondary | ICD-10-CM

## 2023-03-15 DIAGNOSIS — R2689 Other abnormalities of gait and mobility: Secondary | ICD-10-CM | POA: Diagnosis not present

## 2023-03-15 DIAGNOSIS — R293 Abnormal posture: Secondary | ICD-10-CM

## 2023-03-15 DIAGNOSIS — M6281 Muscle weakness (generalized): Secondary | ICD-10-CM

## 2023-03-15 DIAGNOSIS — R42 Dizziness and giddiness: Secondary | ICD-10-CM | POA: Diagnosis not present

## 2023-03-15 NOTE — Therapy (Signed)
OUTPATIENT PHYSICAL THERAPY VESTIBULAR TREATMENT   Patient Name: Ashley Villegas MRN: [id number] DOB:[birth year], 72 y.o., female Today's Date: 03/15/2023  END OF SESSION:  PT End of Session - 03/15/23 0851     Visit Number 3    Number of Visits 12    Date for PT Re-Evaluation 04/17/23    Authorization Type Medicare    Progress Note Due on Visit 10    PT Start Time 0848    PT Stop Time 0930    PT Time Calculation (min) 42 min    Activity Tolerance Patient limited by pain    Behavior During Therapy [facility] for tasks assessed/performed               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, recurrent    Anxiety    Arthritis    Carpal tunnel syndrome, bilateral upper limbs    Complication of anesthesia    Depression    Family history of uterine cancer    GERD (gastroesophageal reflux disease)    History of radiation therapy 11/08/2016-12/27/2016   pelvis 45 Gy in 25 fractions, pelvis boost 14 gy in 7 fractions   History of radiation therapy    endometrial - pelvis IMRT 11/10/2019-11/27/2019   Hypothyroidism    on synthroid    PONV (postoperative nausea and vomiting)    Past Surgical History:  Procedure Laterality Date   CARPAL TUNNEL RELEASE Bilateral 2020   CESAREAN SECTION     COLONOSCOPY     DIAGNOSTIC LAPAROSCOPY     fibroid tumors on her ovaries    DILATION AND CURETTAGE OF UTERUS     HAND SURGERY Right 2019   carpal tunnel   HYSTEROSCOPY WITH D & C N/A 09/15/2014   Procedure: DILATATION AND CURETTAGE /HYSTEROSCOPY/MYOSURE RESECTION OF POLYP;  Surgeon: Myna Hidalgo, DO;  Location: [facility];  Service: Gynecology;  Laterality: N/A;   KNEE SURGERY     LYMPH NODE DISSECTION N/A 10/26/2014   Procedure: POSS LYMPHADENECTOMY ;  Surgeon: Cleda Mccreedy, MD;  Location: [facility];  Service: Gynecology;  Laterality: N/A;   ROBOTIC ASSISTED TOTAL HYSTERECTOMY WITH BILATERAL SALPINGO OOPHERECTOMY Bilateral 10/26/2014    Procedure: ROBOTIC ASSISTED TOTAL HYSTERECTOMY WITH BILATERAL SALPINGO OOPHORECTOMY;  Surgeon: Cleda Mccreedy, MD;  Location: [facility];  Service: Gynecology;  Laterality: Bilateral;   TOTAL KNEE ARTHROPLASTY Left 04/15/2019   Procedure: LEFT TOTAL KNEE ARTHROPLASTY;  Surgeon: Marcene Corning, MD;  Location: [facility];  Service: Orthopedics;  Laterality: Left;   TUBAL LIGATION     WISDOM TOOTH EXTRACTION     Patient Active Problem List   Diagnosis Date Noted   Preventive measure 11/17/2019   Cancer associated pain 11/03/2019   Class II obesity 05/26/2019   Primary osteoarthritis of left knee 04/15/2019   Inflammatory arthritis 03/14/2018   Neuropathy 01/16/2018   Vitamin D deficiency 11/11/2017   Hypothyroid 11/09/2017   Total body pain 11/09/2017   Sinusitis 10/11/2017   Mediastinal lymphadenopathy 10/11/2017   Skin rash 10/11/2017   Genetic testing 08/29/2017   Hypothyroidism 08/08/2017   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   Depression 07/30/2017   Encounter for antineoplastic immunotherapy 07/30/2017   Metastasis to lung (HCC) 07/30/2017   Groin pain, right 07/30/2017   Family history of uterine cancer    Left groin pain    Endometrial cancer (HCC)     PCP: Thana Ates, MD REFERRING PROVIDER: Thana Ates, MD  REFERRING DIAG: R42 (ICD-10-CM) - Dizziness and giddiness  THERAPY DIAG:  No diagnosis found.  ONSET DATE: 4 weeks (02/13/23)  Rationale for Evaluation and Treatment: Rehabilitation  SUBJECTIVE:   SUBJECTIVE STATEMENT: Pt reports she was very sore after last round of needling for a full day. It's feeling better now and feels she can move her neck.   Headache: 1/10 Neck pain: 3/10 Dizziness: 0/10   Pt accompanied by: family member  PERTINENT HISTORY:  Pt notes history of C3, C4, and C5 disc herniations and chronic neck pain 02/15/23 ED notes: past medical history of hypertension, hyperlipidemia, hypothyroidism presenting to the emergency  department with dizziness and weakness   PAIN:  Are you having pain? Yes: NPRS scale: 2 to 8/10 Pain location: neck, headache Pain description: sharp/aching Aggravating factors: head movement, light, sounds Relieving factors: Rest, heat, ibuprofen  PRECAUTIONS: Fall  RED FLAGS: None   WEIGHT BEARING RESTRICTIONS: No  FALLS: Has patient fallen in last 6 months? No  LIVING ENVIRONMENT: Lives with: lives with their son Lives in: House/apartment Stairs: No Has following equipment at home: None  PLOF: Independent  PATIENT GOALS: Improve dizziness  OBJECTIVE:  Note: Objective measures were completed at Evaluation unless otherwise noted.  DIAGNOSTIC FINDINGS: n/a  COGNITION: Overall cognitive status: Within functional limits for tasks assessed   SENSATION: WFL  PALPATION:  Increased muscle tone and trigger points notable along L>R upper trap, cervical paraspinals, suboccipitals  DTRs:  Did not assess  POSTURE:  rounded shoulders, forward head, and increased thoracic kyphosis  Cervical ROM:  very slow and guarded with all motion  Active A/PROM (deg) eval  Flexion 100% * (not new)  Extension 100%  Right lateral flexion 80%  Left lateral flexion 60%  Right rotation 100%  Left rotation 50%*  (Blank rows = not tested, * = pain)  STRENGTH: Did not assess UEs today  LOWER EXTREMITY MMT:   MMT Right eval Left eval  Hip flexion    Hip abduction    Hip adduction    Hip internal rotation    Hip external rotation    Knee flexion    Knee extension    Ankle dorsiflexion    Ankle plantarflexion    Ankle inversion    Ankle eversion    (Blank rows = not tested)  BED MOBILITY:  CGA for safety due to dizziness Slow and guarded with all movements for sidelying<>sit  TRANSFERS: Assistive device utilized: None  Sit to stand: Complete Independence Stand to sit: Complete Independence Chair to chair: Complete Independence Floor: did not assess  GAIT: Gait  pattern: WFL, step through pattern, and wide BOS Distance walked: Into clinic Assistive device utilized: None Level of assistance: SBA for safety Comments: Slow and guarded with movements; hands in high guard position  FUNCTIONAL TESTS:  5 times sit to stand: TBA Functional gait assessment: TBA  PATIENT SURVEYS:  FOTO 39; predicted 55  VESTIBULAR ASSESSMENT:  GENERAL OBSERVATION: Guards from cervical motion   SYMPTOM BEHAVIOR:  Subjective history: Reports neck pain and dizziness occur together (days of dizziness she did not have neck pain)  Non-Vestibular symptoms: neck pain, headaches, tinnitus, and nausea/vomiting  Type of dizziness: Blurred Vision and spinning, diplopia, "my head is floating"  Frequency: every day until last Friday -- currently at least 2-3x/wk   Duration: all day  Aggravating factors: Induced by position change: sit to stand and Induced by motion: bending down to the ground, turning body quickly, and turning head quickly  Relieving factors:  lying supine and rest  Progression of symptoms: unchanged  OCULOMOTOR EXAM:  Ocular Alignment: normal  Ocular ROM: No Limitations  Spontaneous Nystagmus: absent  Gaze-Induced Nystagmus: absent  Smooth Pursuits: intact but reports increase dizziness with looking left  Saccades: slow; difficulty focusing with hypometric movement with looking down  Convergence/Divergence: WNL  FRENZEL - FIXATION SUPRESSED: Did not assess  VESTIBULAR - OCULAR REFLEX:   Slow VOR: Normal; pulls on neck and increases dizziness  VOR Cancellation: Comment: Unable to turn head to the left with increased dizziness  Head-Impulse Test: HIT Right: negative HIT Left: difficulty assessing due to pt guarding  Dynamic Visual Acuity: Not able to be assessed   POSITIONAL TESTING: Right Sidelying: no nystagmus Left Sidelying: no nystagmus Other: Pt unable to tolerate Dix-Hallpike position due to her limited neck motion; no nystagmus or symptoms  noticeable with sidelying Dix-Hallpike  MOTION SENSITIVITY:  Motion Sensitivity Quotient Intensity: 0 = none, 1 = Lightheaded, 2 = Mild, 3 = Moderate, 4 = Severe, 5 = Vomiting  Intensity  1. Sitting to supine   2. Supine to L side   3. Supine to R side   4. Supine to sitting   5. L Hallpike-Dix   6. Up from L    7. R Hallpike-Dix   8. Up from R    9. Sitting, head tipped to L knee   10. Head up from L knee   11. Sitting, head tipped to R knee   12. Head up from R knee   13. Sitting head turns x5   14.Sitting head nods x5   15. In stance, 180 turn to L    16. In stance, 180 turn to R     OTHOSTATICS: not done   TREATMENT:                                                                                                   Therex Sitting  Levator scap stretch x30"  UT stretch x30"  Attempted suboccipital stretch but too painful for pt Supine  Cervical retraction x10  Shoulder ER x10, yellow TB x10  "W" yellow TB 2x10  Shoulder horizontal abd yellow TB 2x10 Standing  Row yellow TB 2x10  Manual therapy STM & TPR L suboccipital Skilled assessment and palpation for TPDN Trigger Point Dry-Needling  Treatment instructions: Expect mild to moderate muscle soreness. S/S of pneumothorax if dry needled over a lung field, and to seek immediate medical attention should they occur. Patient verbalized understanding of these instructions and education.  Patient Consent Given: Yes Education handout provided: Previously provided Muscles treated: L suboccipital Electrical stimulation performed: No Parameters: N/A Treatment response/outcome: Twitch response, reduced tenderness  Modalities heat at end of session x 10 min  PATIENT EDUCATION: Education details: continue HEP and added to HEP Person educated: Patient Education method: Programmer, multimedia, Demonstration, and Handouts Education comprehension: verbalized understanding, returned demonstration, and needs further education  HOME  EXERCISE PROGRAM: Access Code: [email or web address] URL: [email or web address] Date: 03/06/2023 Prepared by: Vernon Prey April Kirstie Peri  Exercises - Seated Shoulder Rolls  - 1 x daily - 7  x weekly - 1 sets - 10 reps - Gentle Levator Scapulae Stretch  - 1 x daily - 7 x weekly - 2 sets - 30 sec hold - Seated Upper Trapezius Stretch (Mirrored)  - 1 x daily - 7 x weekly - 2 sets - 30 sec hold - Seated Cervical Rotation AROM  - 1 x daily - 7 x weekly - 2 sets - 30 sec hold - Supine Suboccipital Release with Tennis Balls  - 1 x daily - 7 x weekly - 1 sets - 1 reps - 5-10 min hold - Seated Assisted Cervical Rotation with Towel (to R side only)  - 1 x daily - 7 x weekly - 1 sets - 3-5 reps - 30 sec hold   GOALS: Goals reviewed with patient? Yes  SHORT TERM GOALS: Target date: 04/03/2023    Pt will be ind with initial HEP Baseline: Goal status: INITIAL  2.  Pt will demo L = R cervical ROM Baseline:  Goal status: INITIAL  3.  Pt will report decrease in neck pain and headache by >/=50% for improved muscle guarding Baseline:  Goal status: INITIAL  4.  Pt will report decrease in dizziness by >/=50% Baseline:  Goal status: INITIAL  5. Pt will be able to tolerate DGI or FGA to obtain dynamic balance score  Baseline: Unable to assess on eval due to pt guarding, assessed 12/16 and LTG set  Goal status: MET   LONG TERM GOALS: Target date: 05/01/2023   Pt will be ind with management and progression of HEP Baseline:  Goal status: INITIAL  2.  Pt will be able to perform all cervical and oculomotor movements without c/o dizziness Baseline:  Goal status: INITIAL  3.  Pt will improve FGA to 17/30 for decreased fall risk  Baseline: 11/30 (12/16) Goal status: INITIAL  4.  Pt will report full resolution of her dizziness Baseline:  Goal status: INITIAL  5.  Pt will have improved FOTO to >/=55 Baseline:  Goal status: INITIAL   ASSESSMENT:  CLINICAL IMPRESSION: Performed TPDN for  pt's L suboccipital. Reviewed HEP and worked on stretching cervical muscles. Initiated postural stabilization exercises.    OBJECTIVE IMPAIRMENTS: decreased activity tolerance, decreased balance, decreased coordination, decreased endurance, decreased mobility, decreased ROM, decreased strength, dizziness, hypomobility, increased fascial restrictions, increased muscle spasms, impaired flexibility, impaired UE functional use, improper body mechanics, postural dysfunction, and pain.   ACTIVITY LIMITATIONS: bending, sitting, standing, squatting, sleeping, stairs, transfers, bed mobility, and locomotion level  PARTICIPATION LIMITATIONS: meal prep, cleaning, laundry, driving, shopping, and community activity  PERSONAL FACTORS: Age, Fitness, Past/current experiences, and Time since onset of injury/illness/exacerbation are also affecting patient's functional outcome.   REHAB POTENTIAL: Good  CLINICAL DECISION MAKING: Evolving/moderate complexity  EVALUATION COMPLEXITY: Moderate   PLAN:  PT FREQUENCY: 2x/week  PT DURATION: 6 weeks  PLANNED INTERVENTIONS: 97164- PT Re-evaluation, 97110-Therapeutic exercises, 97530- Therapeutic activity, [id number]- Neuromuscular re-education, 97535- Self Care, 25366- Manual therapy, [id number]- Gait training, [phone]- Canalith repositioning, 97014- Electrical stimulation (unattended), [phone]- Ionotophoresis 4mg /ml Dexamethasone, Patient/Family education, Balance training, Stair training, Taping, Dry Needling, Joint mobilization, Spinal mobilization, Vestibular training, Cryotherapy, and Moist heat  PLAN FOR NEXT SESSION: Assess response to HEP and dry needling. Manual work/TPDN as indicated. Work on cervical ROM/strengthening if indicated. Check for BPPV if pt has more neck ROM to tolerate. Assess DGI or FGA. Initiate gaze stabilization and habituation exercises   Ayaz Sondgeroth April Ma L Val Schiavo, PT  03/15/2023, 8:53 AM

## 2023-03-18 ENCOUNTER — Ambulatory Visit: Payer: Medicare Other | Admitting: Physical Therapy

## 2023-03-18 ENCOUNTER — Encounter: Payer: Self-pay | Admitting: Physical Therapy

## 2023-03-18 DIAGNOSIS — M6281 Muscle weakness (generalized): Secondary | ICD-10-CM

## 2023-03-18 DIAGNOSIS — R293 Abnormal posture: Secondary | ICD-10-CM | POA: Diagnosis not present

## 2023-03-18 DIAGNOSIS — M62838 Other muscle spasm: Secondary | ICD-10-CM

## 2023-03-18 DIAGNOSIS — R42 Dizziness and giddiness: Secondary | ICD-10-CM

## 2023-03-18 DIAGNOSIS — R2689 Other abnormalities of gait and mobility: Secondary | ICD-10-CM | POA: Diagnosis not present

## 2023-03-18 NOTE — Therapy (Signed)
OUTPATIENT PHYSICAL THERAPY VESTIBULAR TREATMENT   Patient Name: Rashelle Geschke MRN: [id number] DOB:22-Feb-1951, 72 y.o., female Today's Date: 03/18/2023  END OF SESSION:  PT End of Session - 03/18/23 0934     Visit Number 4    Number of Visits 12    Date for PT Re-Evaluation 04/17/23    Authorization Type Medicare    Progress Note Due on Visit 10    PT Start Time 0933    PT Stop Time 1012    PT Time Calculation (min) 39 min    Equipment Utilized During Treatment Gait belt    Activity Tolerance Patient limited by pain    Behavior During Therapy [facility] for tasks assessed/performed                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, recurrent    Anxiety    Arthritis    Carpal tunnel syndrome, bilateral upper limbs    Complication of anesthesia    Depression    Family history of uterine cancer    GERD (gastroesophageal reflux disease)    History of radiation therapy 11/08/2016-12/27/2016   pelvis 45 Gy in 25 fractions, pelvis boost 14 gy in 7 fractions   History of radiation therapy    endometrial - pelvis IMRT 11/10/2019-11/27/2019   Hypothyroidism    on synthroid    PONV (postoperative nausea and vomiting)    Past Surgical History:  Procedure Laterality Date   CARPAL TUNNEL RELEASE Bilateral 2020   CESAREAN SECTION     COLONOSCOPY     DIAGNOSTIC LAPAROSCOPY     fibroid tumors on her ovaries    DILATION AND CURETTAGE OF UTERUS     HAND SURGERY Right 2019   carpal tunnel   HYSTEROSCOPY WITH D & C N/A 09/15/2014   Procedure: DILATATION AND CURETTAGE /HYSTEROSCOPY/MYOSURE RESECTION OF POLYP;  Surgeon: Myna Hidalgo, DO;  Location: [facility];  Service: Gynecology;  Laterality: N/A;   KNEE SURGERY     LYMPH NODE DISSECTION N/A 10/26/2014   Procedure: POSS LYMPHADENECTOMY ;  Surgeon: Cleda Mccreedy, MD;  Location: [facility];  Service: Gynecology;  Laterality: N/A;   ROBOTIC ASSISTED TOTAL HYSTERECTOMY WITH  BILATERAL SALPINGO OOPHERECTOMY Bilateral 10/26/2014   Procedure: ROBOTIC ASSISTED TOTAL HYSTERECTOMY WITH BILATERAL SALPINGO OOPHORECTOMY;  Surgeon: Cleda Mccreedy, MD;  Location: [facility];  Service: Gynecology;  Laterality: Bilateral;   TOTAL KNEE ARTHROPLASTY Left 04/15/2019   Procedure: LEFT TOTAL KNEE ARTHROPLASTY;  Surgeon: Marcene Corning, MD;  Location: [facility];  Service: Orthopedics;  Laterality: Left;   TUBAL LIGATION     WISDOM TOOTH EXTRACTION     Patient Active Problem List   Diagnosis Date Noted   Preventive measure 11/17/2019   Cancer associated pain 11/03/2019   Class II obesity 05/26/2019   Primary osteoarthritis of left knee 04/15/2019   Inflammatory arthritis 03/14/2018   Neuropathy 01/16/2018   Vitamin D deficiency 11/11/2017   Hypothyroid 11/09/2017   Total body pain 11/09/2017   Sinusitis 10/11/2017   Mediastinal lymphadenopathy 10/11/2017   Skin rash 10/11/2017   Genetic testing 08/29/2017   Hypothyroidism 08/08/2017   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   Depression 07/30/2017   Encounter for antineoplastic immunotherapy 07/30/2017   Metastasis to lung (HCC) 07/30/2017   Groin pain, right 07/30/2017   Family history of uterine cancer    Left groin pain    Endometrial cancer (HCC)     PCP: Margaretann Loveless  Dorris Singh, MD REFERRING PROVIDER: Thana Ates, MD  REFERRING DIAG: R42 (ICD-10-CM) - Dizziness and giddiness  THERAPY DIAG:  Dizziness and giddiness  Other muscle spasm  Abnormal posture  Other abnormalities of gait and mobility  Muscle weakness (generalized)  ONSET DATE: 4 weeks (02/13/23)  Rationale for Evaluation and Treatment: Rehabilitation  SUBJECTIVE:   SUBJECTIVE STATEMENT: Pt reports she is feeling "good" today, does have an ongoing headache right on the top of her head. No neck pain today. No dizziness today, only had to take meclizine one time last week.  Pt feels like the DN has been helpful, was sore after last visit from the  new exercises, didn't do any exercises Saturday as she was sore. Pt does not want any DN this session.  Pt feels like suboccipital release with tennis balls encourages a headache so cautious about that exercise.  Headache: 3/10 Neck pain: 0/10 Dizziness: 0/10   Pt accompanied by: family member  PERTINENT HISTORY:  Pt notes history of C3, C4, and C5 disc herniations and chronic neck pain 02/15/23 ED notes: past medical history of hypertension, hyperlipidemia, hypothyroidism presenting to the emergency department with dizziness and weakness   PAIN:  Are you having pain? Yes: NPRS scale: 2 to 8/10 Pain location: neck, headache Pain description: sharp/aching Aggravating factors: head movement, light, sounds Relieving factors: Rest, heat, ibuprofen  PRECAUTIONS: Fall  RED FLAGS: None   WEIGHT BEARING RESTRICTIONS: No  FALLS: Has patient fallen in last 6 months? No  LIVING ENVIRONMENT: Lives with: lives with their son Lives in: House/apartment Stairs: No Has following equipment at home: None  PLOF: Independent  PATIENT GOALS: Improve dizziness  OBJECTIVE:  Note: Objective measures were completed at Evaluation unless otherwise noted.  DIAGNOSTIC FINDINGS: n/a  COGNITION: Overall cognitive status: Within functional limits for tasks assessed   SENSATION: WFL  PALPATION:  Increased muscle tone and trigger points notable along L>R upper trap, cervical paraspinals, suboccipitals  DTRs:  Did not assess  POSTURE:  rounded shoulders, forward head, and increased thoracic kyphosis  Cervical ROM:  very slow and guarded with all motion  Active A/PROM (deg) eval  Flexion 100% * (not new)  Extension 100%  Right lateral flexion 80%  Left lateral flexion 60%  Right rotation 100%  Left rotation 50%*  (Blank rows = not tested, * = pain)  STRENGTH: Did not assess UEs today  LOWER EXTREMITY MMT:   MMT Right eval Left eval  Hip flexion    Hip abduction    Hip  adduction    Hip internal rotation    Hip external rotation    Knee flexion    Knee extension    Ankle dorsiflexion    Ankle plantarflexion    Ankle inversion    Ankle eversion    (Blank rows = not tested)  BED MOBILITY:  CGA for safety due to dizziness Slow and guarded with all movements for sidelying<>sit  TRANSFERS: Assistive device utilized: None  Sit to stand: Complete Independence Stand to sit: Complete Independence Chair to chair: Complete Independence Floor: did not assess  GAIT: Gait pattern: WFL, step through pattern, and wide BOS Distance walked: Into clinic Assistive device utilized: None Level of assistance: SBA for safety Comments: Slow and guarded with movements; hands in high guard position  FUNCTIONAL TESTS:  5 times sit to stand: TBA Functional gait assessment: TBA  PATIENT SURVEYS:  FOTO 39; predicted 55  VESTIBULAR ASSESSMENT:  GENERAL OBSERVATION: Guards from cervical motion   SYMPTOM  BEHAVIOR:  Subjective history: Reports neck pain and dizziness occur together (days of dizziness she did not have neck pain)  Non-Vestibular symptoms: neck pain, headaches, tinnitus, and nausea/vomiting  Type of dizziness: Blurred Vision and spinning, diplopia, "my head is floating"  Frequency: every day until last Friday -- currently at least 2-3x/wk   Duration: all day  Aggravating factors: Induced by position change: sit to stand and Induced by motion: bending down to the ground, turning body quickly, and turning head quickly  Relieving factors: lying supine and rest  Progression of symptoms: unchanged  OCULOMOTOR EXAM:  Ocular Alignment: normal  Ocular ROM: No Limitations  Spontaneous Nystagmus: absent  Gaze-Induced Nystagmus: absent  Smooth Pursuits: intact but reports increase dizziness with looking left  Saccades: slow; difficulty focusing with hypometric movement with looking down  Convergence/Divergence: WNL  FRENZEL - FIXATION SUPRESSED: Did not  assess  VESTIBULAR - OCULAR REFLEX:   Slow VOR: Normal; pulls on neck and increases dizziness  VOR Cancellation: Comment: Unable to turn head to the left with increased dizziness  Head-Impulse Test: HIT Right: negative HIT Left: difficulty assessing due to pt guarding  Dynamic Visual Acuity: Not able to be assessed   POSITIONAL TESTING: Right Sidelying: no nystagmus Left Sidelying: no nystagmus Other: Pt unable to tolerate Dix-Hallpike position due to her limited neck motion; no nystagmus or symptoms noticeable with sidelying Dix-Hallpike  MOTION SENSITIVITY:  Motion Sensitivity Quotient Intensity: 0 = none, 1 = Lightheaded, 2 = Mild, 3 = Moderate, 4 = Severe, 5 = Vomiting  Intensity  1. Sitting to supine   2. Supine to L side   3. Supine to R side   4. Supine to sitting   5. L Hallpike-Dix   6. Up from L    7. R Hallpike-Dix   8. Up from R    9. Sitting, head tipped to L knee   10. Head up from L knee   11. Sitting, head tipped to R knee   12. Head up from R knee   13. Sitting head turns x5   14.Sitting head nods x5   15. In stance, 180 turn to L    16. In stance, 180 turn to R     OTHOSTATICS: not done   TREATMENT:                                                                                                   TherEx Seated shoulder rolls following standing balance activities. Pt with some increase in pain with shoulder rolls so deferred further repetitions.  TherAct Discussed how to modify suboccipital release with tennis balls with use of pillow under her head or just using her fingers to gently press into muscles.  Corner balance: Romberg stance with EC 3 x 30 sec each Wide tandem stance with EO 3 x 30 sec each More difficulty with RLE back vs LLE back SLS with fingertip support EO 2 x 30 sec each Onset of LUT pain with grip on chair  Added to HEP, see bolded below  Static standing balance in // bars: Rockerboard  A/P 3 x 30 sec each no UE Rockerboard L/R  x 30 sec, x 15 sec, x 20 sec no UE More difficulty with L/R vs A/P, onset of L UT pain due to tension in shoulders   PATIENT EDUCATION: Education details: continue HEP and added to HEP, modified suboccipital release Person educated: Patient Education method: Explanation, Demonstration, and Handouts Education comprehension: verbalized understanding, returned demonstration, and needs further education  HOME EXERCISE PROGRAM: Access Code: [email or web address] URL: [email or web address] Date: 03/06/2023 Prepared by: Vernon Prey April Kirstie Peri  Exercises - Seated Shoulder Rolls  - 1 x daily - 7 x weekly - 1 sets - 10 reps - Gentle Levator Scapulae Stretch  - 1 x daily - 7 x weekly - 2 sets - 30 sec hold - Seated Upper Trapezius Stretch (Mirrored)  - 1 x daily - 7 x weekly - 2 sets - 30 sec hold - Seated Cervical Rotation AROM  - 1 x daily - 7 x weekly - 2 sets - 30 sec hold - Supine Suboccipital Release with Tennis Balls  - 1 x daily - 7 x weekly - 1 sets - 1 reps - 5-10 min hold - Seated Assisted Cervical Rotation with Towel (to R side only)  - 1 x daily - 7 x weekly - 1 sets - 3-5 reps - 30 sec hold - Romberg Stance with Eyes Closed  - 1 x daily - 7 x weekly - 1 sets - 3-5 reps - 30 sec hold - Wide Tandem Stance with Eyes Open  - 1 x daily - 7 x weekly - 1 sets - 3-5 reps - 30 sec hold   GOALS: Goals reviewed with patient? Yes  SHORT TERM GOALS: Target date: 04/03/2023   Pt will be ind with initial HEP Baseline: Goal status: INITIAL  2.  Pt will demo L = R cervical ROM Baseline:  Goal status: INITIAL  3.  Pt will report decrease in neck pain and headache by >/=50% for improved muscle guarding Baseline:  Goal status: INITIAL  4.  Pt will report decrease in dizziness by >/=50% Baseline:  Goal status: INITIAL  5. Pt will be able to tolerate DGI or FGA to obtain dynamic balance score  Baseline: Unable to assess on eval due to pt guarding, assessed 12/16 and LTG set  Goal  status: MET   LONG TERM GOALS: Target date: 05/01/2023   Pt will be ind with management and progression of HEP Baseline:  Goal status: INITIAL  2.  Pt will be able to perform all cervical and oculomotor movements without c/o dizziness Baseline:  Goal status: INITIAL  3.  Pt will improve FGA to 17/30 for decreased fall risk  Baseline: 11/30 (12/16) Goal status: INITIAL  4.  Pt will report full resolution of her dizziness Baseline:  Goal status: INITIAL  5.  Pt will have improved FOTO to >/=55 Baseline:  Goal status: INITIAL   ASSESSMENT:  CLINICAL IMPRESSION: Emphasis of skilled PT session on discussing how to modify current HEP to make it more tolerable for patient as well as trialing various standing balance exercises to determine what would be safe and appropriate to add to HEP. Pt most challenged by Romberg stance with EC and modified tandem stance with EO, she is also challenged by SLS with fingertip support but this increases her neck pain. Pt continues to benefit from skilled therapy services to work on improving her balance, decreasing her fall risk, and increasing her independence with management of her pain  symptoms. Continue POC.    OBJECTIVE IMPAIRMENTS: decreased activity tolerance, decreased balance, decreased coordination, decreased endurance, decreased mobility, decreased ROM, decreased strength, dizziness, hypomobility, increased fascial restrictions, increased muscle spasms, impaired flexibility, impaired UE functional use, improper body mechanics, postural dysfunction, and pain.   ACTIVITY LIMITATIONS: bending, sitting, standing, squatting, sleeping, stairs, transfers, bed mobility, and locomotion level  PARTICIPATION LIMITATIONS: meal prep, cleaning, laundry, driving, shopping, and community activity  PERSONAL FACTORS: Age, Fitness, Past/current experiences, and Time since onset of injury/illness/exacerbation are also affecting patient's functional outcome.    REHAB POTENTIAL: Good  CLINICAL DECISION MAKING: Evolving/moderate complexity  EVALUATION COMPLEXITY: Moderate   PLAN:  PT FREQUENCY: 2x/week  PT DURATION: 6 weeks  PLANNED INTERVENTIONS: 97164- PT Re-evaluation, 97110-Therapeutic exercises, 97530- Therapeutic activity, [id number]- Neuromuscular re-education, 97535- Self Care, 16109- Manual therapy, [id number]- Gait training, [phone]- Canalith repositioning, 97014- Electrical stimulation (unattended), 97033- Ionotophoresis 4mg /ml Dexamethasone, Patient/Family education, Balance training, Stair training, Taping, Dry Needling, Joint mobilization, Spinal mobilization, Vestibular training, Cryotherapy, and Moist heat  PLAN FOR NEXT SESSION: Assess response to HEP and dry needling. Manual work/TPDN as indicated. Work on cervical ROM/strengthening if indicated. Check for BPPV if pt has more neck ROM to tolerate. Initiate gaze stabilization and habituation exercises   Peter Congo, PT Peter Congo, PT, DPT, CSRS   03/18/2023, 10:13 AM

## 2023-03-18 NOTE — Therapy (Signed)
[facility] [address] [place], [place], [address] Phone: [phone]   Fax:  [phone]  Patient Details  Name: Ashley Villegas MRN: [id number] Date of Birth: Aug 29, 1950 Referring Provider:  No ref. provider found  Encounter Date: 03/18/2023   Patient called clinic following her appointment this morning reporting her headache has increased in severity and she is now bedridden. At start of PT session pt reported a 3/10 headache on the top of her head, now reporting a headache along suboccipital referral pattern starting at the back of her head. Pt reports she has been getting these headaches after PT so is unsure if PT is helping her at this point. Encouraged pt to reach out to her referring provider (PCP) as nothing performed in her session this date should have caused these symptoms as focus of session was standing balance. Cancelled patient's appointment on 12/27 per her request. Will follow up with patient at her next scheduled appointment on 12/31 regarding symptoms and response to PT.   Peter Congo, PT Peter Congo, PT, DPT, CSRS  03/18/2023, 1:31 PM  [facility] [address] [place], [place], [address] Phone: [phone]   Fax:  [phone]

## 2023-03-19 ENCOUNTER — Other Ambulatory Visit (HOSPITAL_COMMUNITY): Payer: Self-pay | Admitting: Internal Medicine

## 2023-03-19 DIAGNOSIS — M542 Cervicalgia: Secondary | ICD-10-CM | POA: Diagnosis not present

## 2023-03-19 DIAGNOSIS — R519 Headache, unspecified: Secondary | ICD-10-CM | POA: Diagnosis not present

## 2023-03-21 ENCOUNTER — Ambulatory Visit (HOSPITAL_COMMUNITY)
Admission: RE | Admit: 2023-03-21 | Discharge: 2023-03-21 | Disposition: A | Discharge status: Home / Self Care | Payer: Medicare Other | Source: Ambulatory Visit | Attending: Internal Medicine | Admitting: Internal Medicine

## 2023-03-21 DIAGNOSIS — R519 Headache, unspecified: Secondary | ICD-10-CM | POA: Insufficient documentation

## 2023-03-21 DIAGNOSIS — M50223 Other cervical disc displacement at C6-C7 level: Secondary | ICD-10-CM | POA: Diagnosis not present

## 2023-03-21 DIAGNOSIS — M4802 Spinal stenosis, cervical region: Secondary | ICD-10-CM | POA: Diagnosis not present

## 2023-03-22 ENCOUNTER — Ambulatory Visit: Payer: Medicare Other | Admitting: Physical Therapy

## 2023-03-24 NOTE — Progress Notes (Signed)
Radiation Oncology         [phone] ________________________________  Name: Ashley Villegas MRN: [id number]  Date: 03/25/2023  DOB: [birth year]  Follow-Up Visit Note  CC: Thana Ates, MD  Kirby Funk, MD    ICD-10-CM   1. Endometrial cancer (HCC) [C54.1]  C54.1       Diagnosis: : Recurrent endometrial cancer, original stage IA grade 1, s/p hysterectomy  with enhancing lesion centered along the right levator ani muscle, status post additional radiation therapy   Interval Since Last Radiation:  3 years, 3 months, and 27 days e  Radiation Treatment Dates: 11/10/2019 through 11/27/2019 Site Technique Total Dose (Gy) Dose per Fx (Gy) Completed Fx Beam Energies  Perineum: Pelvis IMRT 28/28 2 14/14 6X   Narrative:  The patient returns today for routine follow-up, she was last seen here for follow up on 07/19/22. Since her last visit, the patient followed up with Dr, Pricilla Holm on 09/21/22. During which time, the patient endorsed resolution of her pelvic pain after undergoing pelvic floor therapy.     She also presented to the ED last month on 02/15/23 with c/o of dizziness and weakness. She reported that she had been feeling dizzy with associated nausea for the last 2-3 days, and that her symptoms occurred more with positional changes a and would resolve with rest. She accordingly had a CTA of the head and neck performed which showed no acute intracranial findings.     She later followed up with her PCP on 02/27/23 for follow-up imaging consisting of an MRI of the brain without contrast that day which also showed no acute intracranial findings. She also recently had a MRI of the cervical spine performed on 03/21/23. Results are pending at this time.                          No other significant interval history since the patient was last seen for follow-up.   Patient reports to be doing well overall today. She states that her pelvic floor pain has resolved since completing therapy.  She denies any vaginal spotting, abnormal discharge, or abdominal pain.   Allergies:  is allergic to oxycodone, hydrocodone, and hydrocodone-acetaminophen.  Meds: Current Outpatient Medications  Medication Sig Dispense Refill   albuterol (VENTOLIN HFA) 108 (90 Base) MCG/ACT inhaler SMARTSIG:2 Puff(s) Via Inhaler 4 Times Daily PRN     buPROPion (WELLBUTRIN SR) 150 MG 12 hr tablet Take 150 mg by mouth 2 (two) times daily.     Cholecalciferol (VITAMIN D3) 25 MCG (1000 UT) CAPS Take 1,000 Units by mouth daily.     FLUoxetine (PROZAC) 40 MG capsule Take 40 mg by mouth daily. Per patient, not taken in several months     furosemide (LASIX) 40 MG tablet Take 40 mg by mouth 2 (two) times daily.     levothyroxine (SYNTHROID) 100 MCG tablet Take 88 mcg by mouth daily before breakfast.     meclizine (ANTIVERT) 25 MG tablet Take 1 tablet (25 mg total) by mouth 3 (three) times daily as needed for dizziness. 30 tablet 0   metFORMIN (GLUCOPHAGE) 500 MG tablet Take 500 mg by mouth daily with breakfast.     ondansetron (ZOFRAN) 4 MG tablet Take 1 tablet (4 mg total) by mouth every 6 (six) hours. 12 tablet 0   rosuvastatin (CRESTOR) 10 MG tablet TAKE 1 TABLET BY MOUTH EVERY DAY 15 tablet 0   metoprolol succinate (TOPROL-XL) 25 MG 24 hr tablet  TAKE 1/2 TABLET BY MOUTH EVERY DAY (Patient not taking: Reported on 03/25/2023) 7 tablet 0   No current facility-administered medications for this encounter.    Physical Findings: The patient is in no acute distress. Patient is alert and oriented.  weight is 175 lb 9.6 oz (79.7 kg). Her temperature is 98.1 F (36.7 C). Her blood pressure is 138/54 (abnormal) and her pulse is 62. Her respiration is 18 and oxygen saturation is 100%. .   Lungs are clear to auscultation bilaterally. Heart has regular rate and rhythm. Questionable tenderness to palpation in the left axilla. No palpable axillary, cervical or supraclavicular adenopathy. Abdomen soft and non-tender.  On pelvic  examination the external genitalia were unremarkable. A speculum exam was performed. There are no mucosal lesions noted in the vaginal vault.  Radiation changes are noted along the right lateral vaginal wall.  No suspicious lesions noted. A On bimanual and rectovaginal examination there were no pelvic masses appreciated.  Patient tolerated the pelvic exam extremely well today which she has not done in the past.     Lab Findings: Lab Results  Component Value Date   WBC 5.3 02/15/2023   HGB 13.5 02/15/2023   HCT 40.7 02/15/2023   MCV 93.8 02/15/2023   PLT 223 02/15/2023    Radiographic Findings: MR BRAIN WO CONTRAST Result Date: 02/27/2023 CLINICAL DATA:  Severe vertigo, intractable nausea EXAM: MRI HEAD WITHOUT CONTRAST TECHNIQUE: Multiplanar, multiecho pulse sequences of the brain and surrounding structures were obtained without intravenous contrast. COMPARISON:  No prior MRI available, correlation is made with 02/15/2023 CTA head neck FINDINGS: Brain: No restricted diffusion to suggest acute or subacute infarct. No acute hemorrhage, mass, mass effect, or midline shift. No hydrocephalus or extra-axial collection. Pituitary and craniocervical junction within normal limits. No hemosiderin deposition to suggest remote hemorrhage. Normal cerebral volume for age. Scattered T2 hyperintense signal in the periventricular white matter, likely the sequela of mild chronic small vessel ischemic disease. No evidence of remote cortical infarct. Vascular: Normal arterial flow voids. Skull and upper cervical spine: Normal marrow signal. Sinuses/Orbits: Clear paranasal sinuses. No acute finding in the orbits. Other: The mastoid air cells are well aerated. IMPRESSION: No acute intracranial process. No evidence of acute or subacute infarct. Electronically Signed   By: Wiliam Ke M.D.   On: 02/27/2023 18:12    Impression:  : Recurrent endometrial cancer, original stage IA grade 1, s/p hysterectomy  with enhancing  lesion centered along the right levator ani muscle, status post additional radiation therapy   The patient is doing well overall today. No evidence of disease recurrence on clinical exam today.  Of note, questionable tenderness to the left axilla was elicited on physical exam today. Upon further palpation, the tenderness resolved. No adenopathy was appreciated. Findings are not concerning oncologic standpoint, but patient understands to let us know if symptoms persist.  Plan:  Per NCCN guidelines, patient will follow up with Dr. Pricilla Holm in 6 months and radiation oncology in 12 months. Patient was educated on signs/symptoms that may indicate disease recurrence and understands to notify us if she experiences any of them.   Encouraged patient to reschedule her routine annual mammogram.   30 minutes of total time was spent for this patient encounter, including preparation, face-to-face counseling with the patient and coordination of care, physical exam, and documentation of the encounter. ____________________________________   Bryan Lemma, PA-C   Billie Lade, PhD, MD  This document serves as a record of services personally performed by  Antony Blackbird, MD and Bryan Lemma, PA-C. It was created on his behalf by Neena Rhymes, a trained medical scribe. The creation of this record is based on the scribe's personal observations and the provider's statements to them. This document has been checked and approved by the attending provider.

## 2023-03-25 ENCOUNTER — Ambulatory Visit
Admission: RE | Admit: 2023-03-25 | Discharge: 2023-03-25 | Disposition: A | Discharge status: Home / Self Care | Payer: Medicare Other | Source: Ambulatory Visit | Attending: Radiation Oncology | Admitting: Radiation Oncology

## 2023-03-25 ENCOUNTER — Encounter: Payer: Self-pay | Admitting: Radiation Oncology

## 2023-03-25 ENCOUNTER — Other Ambulatory Visit: Payer: Self-pay | Admitting: Internal Medicine

## 2023-03-25 VITALS — BP 138/54 | HR 62 | Temp 98.1°F | Resp 18 | Wt 175.6 lb

## 2023-03-25 DIAGNOSIS — Z79890 Hormone replacement therapy (postmenopausal): Secondary | ICD-10-CM | POA: Diagnosis not present

## 2023-03-25 DIAGNOSIS — Z1231 Encounter for screening mammogram for malignant neoplasm of breast: Secondary | ICD-10-CM

## 2023-03-25 DIAGNOSIS — Z79899 Other long term (current) drug therapy: Secondary | ICD-10-CM | POA: Diagnosis not present

## 2023-03-25 DIAGNOSIS — Z923 Personal history of irradiation: Secondary | ICD-10-CM | POA: Diagnosis not present

## 2023-03-25 DIAGNOSIS — Z7984 Long term (current) use of oral hypoglycemic drugs: Secondary | ICD-10-CM | POA: Diagnosis not present

## 2023-03-25 DIAGNOSIS — Z8542 Personal history of malignant neoplasm of other parts of uterus: Secondary | ICD-10-CM | POA: Insufficient documentation

## 2023-03-25 DIAGNOSIS — C541 Malignant neoplasm of endometrium: Secondary | ICD-10-CM | POA: Diagnosis not present

## 2023-03-25 NOTE — Progress Notes (Signed)
Ashley Villegas is here today for follow up post radiation to the pelvic.  They completed their radiation on: 11/27/19   Does the patient complain of any of the following:  Pain:No Abdominal bloating: No Diarrhea/Constipation: Constipation  Nausea/Vomiting: No Vaginal Discharge: No Blood in Urine or Stool: No Urinary Issues (dysuria/incomplete emptying/ incontinence/ increased frequency/urgency): no Does patient report using vaginal dilator 2-3 times a week and/or sexually active 2-3 weeks: No Post radiation skin changes: No   Additional comments if applicable:    BP (!) 138/54   Pulse 62   Temp 98.1 F (36.7 C)   Resp 18   Wt 175 lb 9.6 oz (79.7 kg)   SpO2 100%   BMI 33.59 kg/m

## 2023-03-26 ENCOUNTER — Encounter: Payer: Self-pay | Admitting: Neurology

## 2023-03-26 ENCOUNTER — Ambulatory Visit (INDEPENDENT_AMBULATORY_CARE_PROVIDER_SITE_OTHER): Payer: Medicare Other | Admitting: Neurology

## 2023-03-26 ENCOUNTER — Ambulatory Visit: Payer: Medicare Other | Admitting: Physical Therapy

## 2023-03-26 VITALS — BP 143/66 | HR 63 | Ht 60.63 in | Wt 177.0 lb

## 2023-03-26 DIAGNOSIS — R42 Dizziness and giddiness: Secondary | ICD-10-CM

## 2023-03-26 DIAGNOSIS — R2689 Other abnormalities of gait and mobility: Secondary | ICD-10-CM | POA: Diagnosis not present

## 2023-03-26 DIAGNOSIS — H8110 Benign paroxysmal vertigo, unspecified ear: Secondary | ICD-10-CM

## 2023-03-26 DIAGNOSIS — R293 Abnormal posture: Secondary | ICD-10-CM | POA: Diagnosis not present

## 2023-03-26 DIAGNOSIS — M62838 Other muscle spasm: Secondary | ICD-10-CM

## 2023-03-26 DIAGNOSIS — M6281 Muscle weakness (generalized): Secondary | ICD-10-CM

## 2023-03-26 DIAGNOSIS — M542 Cervicalgia: Secondary | ICD-10-CM | POA: Diagnosis not present

## 2023-03-26 MED ORDER — TIZANIDINE HCL 4 MG PO TABS: 2.0000 mg | ORAL_TABLET | Freq: Three times a day (TID) | ORAL | 3 refills | Status: DC | PRN

## 2023-03-26 NOTE — Therapy (Signed)
 OUTPATIENT PHYSICAL THERAPY VESTIBULAR TREATMENT   Patient Name: Ashley Villegas MRN: [id number] DOB:Aug 11, 1950, 72 y.o., female Today's Date: 03/26/2023  END OF SESSION:  PT End of Session - 03/26/23 1056     Visit Number 5    Number of Visits 12    Date for PT Re-Evaluation 04/17/23    Authorization Type Medicare    Progress Note Due on Visit 10    PT Start Time 1055    PT Stop Time 1120    PT Time Calculation (min) 25 min    Equipment Utilized During Treatment Gait belt    Activity Tolerance Patient limited by pain    Behavior During Therapy [facility] for tasks assessed/performed                 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, recurrent    Anxiety    Arthritis    Carpal tunnel syndrome, bilateral upper limbs    Complication of anesthesia    Depression    Family history of uterine cancer    GERD (gastroesophageal reflux disease)    History of radiation therapy 11/08/2016-12/27/2016   pelvis 45 Gy in 25 fractions, pelvis boost 14 gy in 7 fractions   History of radiation therapy    endometrial - pelvis IMRT 11/10/2019-11/27/2019   Hypothyroidism    on synthroid     PONV (postoperative nausea and vomiting)    Past Surgical History:  Procedure Laterality Date   CARPAL TUNNEL RELEASE Bilateral 2020   CESAREAN SECTION     COLONOSCOPY     DIAGNOSTIC LAPAROSCOPY     fibroid tumors on her ovaries    DILATION AND CURETTAGE OF UTERUS     HAND SURGERY Right 2019   carpal tunnel   HYSTEROSCOPY WITH D & C N/A 09/15/2014   Procedure: DILATATION AND CURETTAGE /HYSTEROSCOPY/MYOSURE RESECTION OF POLYP;  Surgeon: Delon Prude, DO;  Location: [facility];  Service: Gynecology;  Laterality: N/A;   KNEE SURGERY     LYMPH NODE DISSECTION N/A 10/26/2014   Procedure: POSS LYMPHADENECTOMY ;  Surgeon: Elenore Simmonds, MD;  Location: [facility];  Service: Gynecology;  Laterality: N/A;   ROBOTIC ASSISTED TOTAL HYSTERECTOMY WITH  BILATERAL SALPINGO OOPHERECTOMY Bilateral 10/26/2014   Procedure: ROBOTIC ASSISTED TOTAL HYSTERECTOMY WITH BILATERAL SALPINGO OOPHORECTOMY;  Surgeon: Elenore Simmonds, MD;  Location: [facility];  Service: Gynecology;  Laterality: Bilateral;   TOTAL KNEE ARTHROPLASTY Left 04/15/2019   Procedure: LEFT TOTAL KNEE ARTHROPLASTY;  Surgeon: Sheril Coy, MD;  Location: [facility];  Service: Orthopedics;  Laterality: Left;   TUBAL LIGATION     WISDOM TOOTH EXTRACTION     Patient Active Problem List   Diagnosis Date Noted   Preventive measure 11/17/2019   Cancer associated pain 11/03/2019   Class II obesity 05/26/2019   Primary osteoarthritis of left knee 04/15/2019   Inflammatory arthritis 03/14/2018   Neuropathy 01/16/2018   Vitamin D  deficiency 11/11/2017   Hypothyroid 11/09/2017   Total body pain 11/09/2017   Sinusitis 10/11/2017   Mediastinal lymphadenopathy 10/11/2017   Skin rash 10/11/2017   Genetic testing 08/29/2017   Hypothyroidism 08/08/2017   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   Depression 07/30/2017   Encounter for antineoplastic immunotherapy 07/30/2017   Metastasis to lung (HCC) 07/30/2017   Groin pain, right 07/30/2017   Family history of uterine cancer    Left groin pain    Endometrial cancer (HCC)     PCP:  Dwight Trula SQUIBB, MD REFERRING PROVIDER: Dwight Trula SQUIBB, MD  REFERRING DIAG: R42 (ICD-10-CM) - Dizziness and giddiness  THERAPY DIAG:  Dizziness and giddiness  Abnormal posture  Other muscle spasm  Muscle weakness (generalized)  ONSET DATE: 4 weeks (02/13/23)  Rationale for Evaluation and Treatment: Rehabilitation  SUBJECTIVE:   SUBJECTIVE STATEMENT: Pt has seen her PCP and neurologist since last PT appointment. Pt just had her neurology appt, they reviewed the results of her most recent brain MRI and cervical MRI, believe that the problem is coming from her neck. Pt reports that her C5/6 discs are herniated but that it is not bad. Pt updated her PCP and  her neurologist that she would be bedridden from severe headaches after leaving her PT sessions. Pt's neurologist suggests she just stick to DN treatment for now. Pt has not been working on her neck exercises due to fear of increasing her pain.  Pt's headache has been a constant since Nov 21. Pt was prescribed prednisone  from her PCP (has already finished that) as well as tizanidine  which her neurologist increased. Pt's PCP also recommended acetaminophen  but her neurologist said since it is not helping to stop taking it. Pt feels like the prednisone  did massively improve her headache symptoms.  Headache: 6/10 Neck pain: 6/10 Dizziness: 0/10   Pt accompanied by: family member (son drives her, pt unable to drive with symptoms)  PERTINENT HISTORY:  Pt notes history of C3, C4, and C5 disc herniations and chronic neck pain 02/15/23 ED notes: past medical history of hypertension, hyperlipidemia, hypothyroidism presenting to the emergency department with dizziness and weakness   PAIN:  Are you having pain? Yes: NPRS scale: 2 to 8/10 Pain location: neck, headache Pain description: sharp/aching Aggravating factors: head movement, light, sounds Relieving factors: Rest, heat, ibuprofen   PRECAUTIONS: Fall  RED FLAGS: None   WEIGHT BEARING RESTRICTIONS: No  FALLS: Has patient fallen in last 6 months? No  LIVING ENVIRONMENT: Lives with: lives with their son Lives in: House/apartment Stairs: No Has following equipment at home: None  PLOF: Independent  PATIENT GOALS: Improve dizziness  OBJECTIVE:  Note: Objective measures were completed at Evaluation unless otherwise noted.  DIAGNOSTIC FINDINGS: n/a  COGNITION: Overall cognitive status: Within functional limits for tasks assessed   SENSATION: WFL  PALPATION:  Increased muscle tone and trigger points notable along L>R upper trap, cervical paraspinals, suboccipitals  DTRs:  Did not assess  POSTURE:  rounded shoulders, forward  head, and increased thoracic kyphosis  Cervical ROM:  very slow and guarded with all motion  Active A/PROM (deg) eval  Flexion 100% * (not new)  Extension 100%  Right lateral flexion 80%  Left lateral flexion 60%  Right rotation 100%  Left rotation 50%*  (Blank rows = not tested, * = pain)  STRENGTH: Did not assess UEs today  LOWER EXTREMITY MMT:   MMT Right eval Left eval  Hip flexion    Hip abduction    Hip adduction    Hip internal rotation    Hip external rotation    Knee flexion    Knee extension    Ankle dorsiflexion    Ankle plantarflexion    Ankle inversion    Ankle eversion    (Blank rows = not tested)  BED MOBILITY:  CGA for safety due to dizziness Slow and guarded with all movements for sidelying<>sit  TRANSFERS: Assistive device utilized: None  Sit to stand: Complete Independence Stand to sit: Complete Independence Chair to chair: Complete Independence Floor:  did not assess  GAIT: Gait pattern: WFL, step through pattern, and wide BOS Distance walked: Into clinic Assistive device utilized: None Level of assistance: SBA for safety Comments: Slow and guarded with movements; hands in high guard position  FUNCTIONAL TESTS:  5 times sit to stand: TBA Functional gait assessment: TBA  PATIENT SURVEYS:  FOTO 39; predicted 55  VESTIBULAR ASSESSMENT:  GENERAL OBSERVATION: Guards from cervical motion   SYMPTOM BEHAVIOR:  Subjective history: Reports neck pain and dizziness occur together (days of dizziness she did not have neck pain)  Non-Vestibular symptoms: neck pain, headaches, tinnitus, and nausea/vomiting  Type of dizziness: Blurred Vision and spinning, diplopia, my head is floating  Frequency: every day until last Friday -- currently at least 2-3x/wk   Duration: all day  Aggravating factors: Induced by position change: sit to stand and Induced by motion: bending down to the ground, turning body quickly, and turning head quickly  Relieving  factors: lying supine and rest  Progression of symptoms: unchanged  OCULOMOTOR EXAM:  Ocular Alignment: normal  Ocular ROM: No Limitations  Spontaneous Nystagmus: absent  Gaze-Induced Nystagmus: absent  Smooth Pursuits: intact but reports increase dizziness with looking left  Saccades: slow; difficulty focusing with hypometric movement with looking down  Convergence/Divergence: WNL  FRENZEL - FIXATION SUPRESSED: Did not assess  VESTIBULAR - OCULAR REFLEX:   Slow VOR: Normal; pulls on neck and increases dizziness  VOR Cancellation: Comment: Unable to turn head to the left with increased dizziness  Head-Impulse Test: HIT Right: negative HIT Left: difficulty assessing due to pt guarding  Dynamic Visual Acuity: Not able to be assessed   POSITIONAL TESTING: Right Sidelying: no nystagmus Left Sidelying: no nystagmus Other: Pt unable to tolerate Dix-Hallpike position due to her limited neck motion; no nystagmus or symptoms noticeable with sidelying Dix-Hallpike  MOTION SENSITIVITY:  Motion Sensitivity Quotient Intensity: 0 = none, 1 = Lightheaded, 2 = Mild, 3 = Moderate, 4 = Severe, 5 = Vomiting  Intensity  1. Sitting to supine   2. Supine to L side   3. Supine to R side   4. Supine to sitting   5. L Hallpike-Dix   6. Up from L    7. R Hallpike-Dix   8. Up from R    9. Sitting, head tipped to L knee   10. Head up from L knee   11. Sitting, head tipped to R knee   12. Head up from R knee   13. Sitting head turns x5   14.Sitting head nods x5   15. In stance, 180 turn to L    16. In stance, 180 turn to R     OTHOSTATICS: not done   TREATMENT:                                                                                                    TherAct Trigger Point Dry-Needling  Treatment instructions: Expect mild to moderate muscle soreness. S/S of pneumothorax if dry needled over a lung field, and to seek immediate medical attention should they occur. Patient verbalized  understanding of  these instructions and education.  Patient Consent Given: Yes Education handout provided: Previously provided Muscles treated: L suboccipitals, L splenius/cervical paraspinals Treatment response/outcome: significant tenderness, only tolerates needle barely inserted into muscle and muscle does tense up with insertion, relaxes with increased time while needle still in muscle    PATIENT EDUCATION: Education details: hold off on HEP for now and report response to DN this date during next session Person educated: Patient Education method: Explanation Education comprehension: verbalized understanding and needs further education  HOME EXERCISE PROGRAM: Access Code: [email or web address] URL: [email or web address] Date: 03/06/2023 Prepared by: Gellen April Marie Nonato  Exercises - Seated Shoulder Rolls  - 1 x daily - 7 x weekly - 1 sets - 10 reps - Gentle Levator Scapulae Stretch  - 1 x daily - 7 x weekly - 2 sets - 30 sec hold - Seated Upper Trapezius Stretch (Mirrored)  - 1 x daily - 7 x weekly - 2 sets - 30 sec hold - Seated Cervical Rotation AROM  - 1 x daily - 7 x weekly - 2 sets - 30 sec hold - Supine Suboccipital Release with Tennis Balls  - 1 x daily - 7 x weekly - 1 sets - 1 reps - 5-10 min hold - Seated Assisted Cervical Rotation with Towel (to R side only)  - 1 x daily - 7 x weekly - 1 sets - 3-5 reps - 30 sec hold - Romberg Stance with Eyes Closed  - 1 x daily - 7 x weekly - 1 sets - 3-5 reps - 30 sec hold - Wide Tandem Stance with Eyes Open  - 1 x daily - 7 x weekly - 1 sets - 3-5 reps - 30 sec hold   GOALS: Goals reviewed with patient? Yes  SHORT TERM GOALS: Target date: 04/03/2023   Pt will be ind with initial HEP Baseline: Goal status: INITIAL  2.  Pt will demo L = R cervical ROM Baseline:  Goal status: INITIAL  3.  Pt will report decrease in neck pain and headache by >/=50% for improved muscle guarding Baseline:  Goal status: INITIAL  4.  Pt  will report decrease in dizziness by >/=50% Baseline:  Goal status: INITIAL  5. Pt will be able to tolerate DGI or FGA to obtain dynamic balance score  Baseline: Unable to assess on eval due to pt guarding, assessed 12/16 and LTG set  Goal status: MET   LONG TERM GOALS: Target date: 05/01/2023   Pt will be ind with management and progression of HEP Baseline:  Goal status: INITIAL  2.  Pt will be able to perform all cervical and oculomotor movements without c/o dizziness Baseline:  Goal status: INITIAL  3.  Pt will improve FGA to 17/30 for decreased fall risk  Baseline: 11/30 (12/16) Goal status: INITIAL  4.  Pt will report full resolution of her dizziness Baseline:  Goal status: INITIAL  5.  Pt will have improved FOTO to >/=55 Baseline:  Goal status: INITIAL   ASSESSMENT:  CLINICAL IMPRESSION: Emphasis of skilled PT session on performing DN to L suboccipitals and cervical paraspinal muscles. Pt with fair tolerance to DN, only able to insert needle just barely into muscle before muscle tenses up significantly. Pt can benefit from further DN treatment to these areas as well as her UT areas as tolerated. Session limited by fair tolerance for DN as well as response to previous PT sessions so did not want to push treatment any further. Continue POC.  OBJECTIVE IMPAIRMENTS: decreased activity tolerance, decreased balance, decreased coordination, decreased endurance, decreased mobility, decreased ROM, decreased strength, dizziness, hypomobility, increased fascial restrictions, increased muscle spasms, impaired flexibility, impaired UE functional use, improper body mechanics, postural dysfunction, and pain.   ACTIVITY LIMITATIONS: bending, sitting, standing, squatting, sleeping, stairs, transfers, bed mobility, and locomotion level  PARTICIPATION LIMITATIONS: meal prep, cleaning, laundry, driving, shopping, and community activity  PERSONAL FACTORS: Age, Fitness, Past/current  experiences, and Time since onset of injury/illness/exacerbation are also affecting patient's functional outcome.   REHAB POTENTIAL: Good  CLINICAL DECISION MAKING: Evolving/moderate complexity  EVALUATION COMPLEXITY: Moderate   PLAN:  PT FREQUENCY: 2x/week  PT DURATION: 6 weeks  PLANNED INTERVENTIONS: 97164- PT Re-evaluation, 97110-Therapeutic exercises, 97530- Therapeutic activity, [id number]- Neuromuscular re-education, 97535- Self Care, [id number]- Manual therapy, [id number]- Gait training, [phone]- Canalith repositioning, 97014- Electrical stimulation (unattended), [phone]- Ionotophoresis 4mg /ml Dexamethasone , Patient/Family education, Balance training, Stair training, Taping, Dry Needling, Joint mobilization, Spinal mobilization, Vestibular training, Cryotherapy, and Moist heat  PLAN FOR NEXT SESSION: Assess response dry needling. Manual work/TPDN as indicated. Work on cervical ROM/strengthening if indicated. Check for BPPV if pt has more neck ROM to tolerate. Initiate gaze stabilization and habituation exercises   Ashley Villegas, PT Ashley Villegas, PT, DPT, CSRS   03/26/2023, 11:20 AM

## 2023-03-26 NOTE — Patient Instructions (Addendum)
 Start taking tizanidine 2mg  in the morning and afternoon as needed.  Continue tizanidine 4mg  at bedtime.  Continue physical therapy for neck range of motion.   If your pain does not improve, recommend discussing injections to your neck with Dr. Regino Schultze.

## 2023-03-26 NOTE — Progress Notes (Signed)
 [facility] Neurology [facility] Clinic Note - Initial Visit   Date: 03/26/2023   Ashley Villegas MRN: [id number] DOB: 04/20/1950   Dear Dr. Dwight:  Thank you for your kind referral of Ashley Villegas for consultation of vertigo and headaches. Although her history is well known to you, please allow us  to reiterate it for the purpose of our medical record. The patient was accompanied to the clinic by Karleen, son, who also provides collateral information.     Ashley Villegas is a 72 y.o. right-handed female with hypertension, hyperlipidemia, hypothyroidism, history of endometrial cancer s/p radiation (2021), and depression presenting for evaluation of vertigo.   IMPRESSION/PLAN: Benign paroxsymal positional vertigo, resolved with meclizine  and vestibular PT.  If symptoms return, restart meclizine . Cervicogenic headaches.  Exam shows marked cervical muscle tenderness and reduced neck ROM due to pain.    - MRI cervical spine pending  - Increase tizanidine  to 2mg  in the morning and afternoon as needed.  Continue tizanidine  4mg  at bedtime  - Continue neck PT  - If no improvement, she may want to discuss ESI injections with Dr. Cesario whom she is established with for low back pain   Return to clinic as needed  ------------------------------------------------------------- History of present illness: On 02/14/2023, she developed sudden onset of dizziness, described as room-spinning, worse with head movement and associated with nausea and marked imbalance, where she was .  She was prescribed meclizine  and started vestibular therapy which resolved symptoms.   Around the same time, she began having headaches, involving the base of the head which radiates to the top and front of the head.  Pain is constant and lasts all day.  She was taking ibuprofen  2000mg  daily for sometime and then was advised by her PCP to take tylenol  3000mg /d.  She had some relief with prednisone  dose  pack, but pain has returned.  She was most recently started on tizanidine  4mg  at bedtime which provides transient relief.  She denies nausea, vomiting, or photophobia. She has some noise sensitivity. She is doing neck PT and reports that dry needling helps, but other exercises leave her exhausted.  MRI brain did not show any worrisome findings. MRI cervical spine was performed on 12/26, results are pending.   She is retired from clinical biochemist.  Nonsmoker.  No alcohol use.    Out-side paper records, electronic medical record, and images have been reviewed where available and summarized as:  MRI brain wo contrast 02/27/2023:  No acute intracranial process. No evidence of acute or subacute infarct.  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, recurrent    Anxiety    Arthritis    Carpal tunnel syndrome, bilateral upper limbs    Complication of anesthesia    Depression    Family history of uterine cancer    GERD (gastroesophageal reflux disease)    History of radiation therapy 11/08/2016-12/27/2016   pelvis 45 Gy in 25 fractions, pelvis boost 14 gy in 7 fractions   History of radiation therapy    endometrial - pelvis IMRT 11/10/2019-11/27/2019   Hypothyroidism    on synthroid     PONV (postoperative nausea and vomiting)     Past Surgical History:  Procedure Laterality Date  CARPAL TUNNEL RELEASE Bilateral 2020   CESAREAN SECTION     COLONOSCOPY     DIAGNOSTIC LAPAROSCOPY     fibroid tumors on her ovaries    DILATION AND CURETTAGE OF UTERUS     HAND SURGERY Right 2019   carpal tunnel   HYSTEROSCOPY WITH D & C N/A 09/15/2014   Procedure: DILATATION AND CURETTAGE /HYSTEROSCOPY/MYOSURE RESECTION OF POLYP;  Surgeon: Jennifer Ozan, DO;   Location: [facility];  Service: Gynecology;  Laterality: N/A;   KNEE SURGERY     LYMPH NODE DISSECTION N/A 10/26/2014   Procedure: POSS LYMPHADENECTOMY ;  Surgeon: Elenore Simmonds, MD;  Location: [facility];  Service: Gynecology;  Laterality: N/A;   ROBOTIC ASSISTED TOTAL HYSTERECTOMY WITH BILATERAL SALPINGO OOPHERECTOMY Bilateral 10/26/2014   Procedure: ROBOTIC ASSISTED TOTAL HYSTERECTOMY WITH BILATERAL SALPINGO OOPHORECTOMY;  Surgeon: Elenore Simmonds, MD;  Location: [facility];  Service: Gynecology;  Laterality: Bilateral;   TOTAL KNEE ARTHROPLASTY Left 04/15/2019   Procedure: LEFT TOTAL KNEE ARTHROPLASTY;  Surgeon: Sheril Coy, MD;  Location: [facility];  Service: Orthopedics;  Laterality: Left;   TUBAL LIGATION     WISDOM TOOTH EXTRACTION       Medications:  Outpatient Encounter Medications as of 03/26/2023  Medication Sig Note   albuterol (VENTOLIN HFA) 108 (90 Base) MCG/ACT inhaler SMARTSIG:2 Puff(s) Via Inhaler 4 Times Daily PRN    buPROPion (WELLBUTRIN SR) 150 MG 12 hr tablet Take 150 mg by mouth once.    Cholecalciferol (VITAMIN D3) 25 MCG (1000 UT) CAPS Take 1,000 Units by mouth daily.    FLUoxetine  (PROZAC ) 40 MG capsule Take 40 mg by mouth daily. Per patient, not taken in several months    furosemide (LASIX) 40 MG tablet Take 40 mg by mouth 2 (two) times daily. 02/22/2021: prn   levothyroxine  (SYNTHROID ) 100 MCG tablet Take 88 mcg by mouth daily before breakfast.    meclizine  (ANTIVERT ) 25 MG tablet Take 1 tablet (25 mg total) by mouth 3 (three) times daily as needed for dizziness.    metFORMIN (GLUCOPHAGE) 500 MG tablet Take 500 mg by mouth daily with breakfast.    ondansetron  (ZOFRAN ) 4 MG tablet Take 1 tablet (4 mg total) by mouth every 6 (six) hours.    rosuvastatin  (CRESTOR ) 10 MG tablet TAKE 1 TABLET BY MOUTH EVERY DAY    tiZANidine  (ZANAFLEX ) 4 MG tablet Take 4 mg by mouth at bedtime.    tiZANidine  (ZANAFLEX ) 4 MG tablet Take 0.5-1 tablets (2-4 mg total) by mouth every 8 (eight) hours as  needed for muscle spasms.    metoprolol  succinate (TOPROL -XL) 25 MG 24 hr tablet TAKE 1/2 TABLET BY MOUTH EVERY DAY (Patient not taking: Reported on 03/26/2023)    No facility-administered encounter medications on file as of 03/26/2023.    Allergies:  Allergies  Allergen Reactions   Oxycodone  Other (See Comments)    hallucinations   Hydrocodone  Rash   Hydrocodone -Acetaminophen  Rash    Family History: Family History  Problem Relation Age of Onset   Breast cancer Maternal Aunt        dx late 50's/60's, died in 8's   Uterine cancer Mother 74   Heart failure Mother    Dementia Father    Heart attack Paternal Grandfather 27   Other Sister 66       had abnromal finding on mammogram, unsure if cancer. Was told to 'remove part of of body'    Social History: Social History   Tobacco Use   Smoking status: Former  Current packs/day: 0.00    Average packs/day: 0.5 packs/day for 13.0 years (6.5 ttl pk-yrs)    Types: Cigarettes    Start date: 10/20/1994    Quit date: 10/20/2007    Years since quitting: 15.4   Smokeless tobacco: Never  Vaping Use   Vaping status: Never Used  Substance Use Topics   Alcohol use: Yes    Alcohol/week: 1.0 standard drink of alcohol    Types: 1 Cans of beer per week    Comment: every 2 to 3 months   Drug use: No   Social History   Social History Narrative   Are you right handed or left handed? Right Handed    Are you currently employed ? No    What is your current occupation?   Do you live at home alone? Yes and No    Who lives with you? Son Karleen comes home sometimes   What type of home do you live in: 1 story or 2 story? Lives in a one story home        Vital Signs:  BP (!) 143/66   Pulse 63   Ht 5' 0.63 (1.54 m)   Wt 177 lb (80.3 kg)   SpO2 97%   BMI 33.85 kg/m    Neurological Exam: MENTAL STATUS including orientation to time, place, person, recent and remote memory, attention span and concentration, language, and fund of  knowledge is normal.  Speech is not dysarthric.  CRANIAL NERVES: II:  No visual field defects.     III-IV-VI: Pupils equal round and reactive to light.  Normal conjugate, extra-ocular eye movements in all directions of gaze.  No nystagmus.  No ptosis.   V:  Normal facial sensation.    VII:  Normal facial symmetry and movements.   VIII:  Normal hearing and vestibular function.   IX-X:  Normal palatal movement.   XI:  Normal shoulder shrug and head rotation.   XII:  Normal tongue strength and range of motion, no deviation or fasciculation.  MOTOR:  Cervical muscle tenderness to palpation with reduced ROM with neck extension, flexion, and rotation. No atrophy, fasciculations or abnormal movements.  No pronator drift.   Upper Extremity:  Right  Left  Deltoid  5/5   5/5   Biceps  5/5   5/5   Triceps  5/5   5/5   Wrist extensors  5/5   5/5   Wrist flexors  5/5   5/5   Finger extensors  5/5   5/5   Finger flexors  5/5   5/5   Dorsal interossei  5/5   5/5   Tone (Ashworth scale)  0  0   Lower Extremity:  Right  Left  Hip flexors  5/5   5/5   Knee flexors  5/5   5/5   Knee extensors  5/5   5/5   Dorsiflexors  5/5   5/5   Plantarflexors  5/5   5/5   Toe extensors  5/5   5/5   Toe flexors  5/5   5/5   Tone (Ashworth scale)  0  0   MSRs:                                           Right        Left brachioradialis 2+  2+  biceps 2+  2+  triceps 2+  2+  patellar 3+  3+  ankle jerk 2+  2+  Hoffman no  no  plantar response down  down   SENSORY:  Normal and symmetric perception of light touch, pinprick, vibration, and temperature.     COORDINATION/GAIT: Normal finger-to- nose-finger.  Intact rapid alternating movements bilaterally.  Gait is mildly wide-based, unassisted, stable.     Thank you for allowing me to participate in patient's care.  If I can answer any additional questions, I would be pleased to do so.    Sincerely,    Chanley Mcenery K. Tobie, DO

## 2023-03-29 ENCOUNTER — Ambulatory Visit
Admission: RE | Admit: 2023-03-29 | Discharge: 2023-03-29 | Disposition: A | Discharge status: Home / Self Care | Payer: Medicare Other | Source: Ambulatory Visit | Attending: Internal Medicine | Admitting: Internal Medicine

## 2023-03-29 ENCOUNTER — Ambulatory Visit: Payer: Medicare Other | Attending: Internal Medicine | Admitting: Physical Therapy

## 2023-03-29 DIAGNOSIS — R2689 Other abnormalities of gait and mobility: Secondary | ICD-10-CM | POA: Diagnosis not present

## 2023-03-29 DIAGNOSIS — M62838 Other muscle spasm: Secondary | ICD-10-CM

## 2023-03-29 DIAGNOSIS — R42 Dizziness and giddiness: Secondary | ICD-10-CM | POA: Diagnosis not present

## 2023-03-29 DIAGNOSIS — M6281 Muscle weakness (generalized): Secondary | ICD-10-CM

## 2023-03-29 DIAGNOSIS — Z1231 Encounter for screening mammogram for malignant neoplasm of breast: Secondary | ICD-10-CM

## 2023-03-29 DIAGNOSIS — R293 Abnormal posture: Secondary | ICD-10-CM | POA: Diagnosis not present

## 2023-03-29 NOTE — Therapy (Signed)
 OUTPATIENT PHYSICAL THERAPY VESTIBULAR TREATMENT   Patient Name: Ashley Villegas MRN: [id number] DOB:11-25-1950, 73 y.o., female Today's Date: 03/29/2023  END OF SESSION:  PT End of Session - 03/29/23 0846     Visit Number 6    Number of Visits 12    Date for PT Re-Evaluation 04/17/23    Authorization Type Medicare    Progress Note Due on Visit 10    PT Start Time 0846    PT Stop Time 0925    PT Time Calculation (min) 39 min    Equipment Utilized During Treatment Gait belt    Activity Tolerance Patient limited by pain    Behavior During Therapy [facility] for tasks assessed/performed              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, recurrent    Anxiety    Arthritis    Carpal tunnel syndrome, bilateral upper limbs    Complication of anesthesia    Depression    Family history of uterine cancer    GERD (gastroesophageal reflux disease)    History of radiation therapy 11/08/2016-12/27/2016   pelvis 45 Gy in 25 fractions, pelvis boost 14 gy in 7 fractions   History of radiation therapy    endometrial - pelvis IMRT 11/10/2019-11/27/2019   Hypothyroidism    on synthroid     PONV (postoperative nausea and vomiting)    Past Surgical History:  Procedure Laterality Date   CARPAL TUNNEL RELEASE Bilateral 2020   CESAREAN SECTION     COLONOSCOPY     DIAGNOSTIC LAPAROSCOPY     fibroid tumors on her ovaries    DILATION AND CURETTAGE OF UTERUS     HAND SURGERY Right 2019   carpal tunnel   HYSTEROSCOPY WITH D & C N/A 09/15/2014   Procedure: DILATATION AND CURETTAGE /HYSTEROSCOPY/MYOSURE RESECTION OF POLYP;  Surgeon: Delon Prude, DO;  Location: [facility];  Service: Gynecology;  Laterality: N/A;   KNEE SURGERY     LYMPH NODE DISSECTION N/A 10/26/2014   Procedure: POSS LYMPHADENECTOMY ;  Surgeon: Elenore Simmonds, MD;  Location: [facility];  Service: Gynecology;  Laterality: N/A;   ROBOTIC ASSISTED TOTAL HYSTERECTOMY WITH  BILATERAL SALPINGO OOPHERECTOMY Bilateral 10/26/2014   Procedure: ROBOTIC ASSISTED TOTAL HYSTERECTOMY WITH BILATERAL SALPINGO OOPHORECTOMY;  Surgeon: Elenore Simmonds, MD;  Location: [facility];  Service: Gynecology;  Laterality: Bilateral;   TOTAL KNEE ARTHROPLASTY Left 04/15/2019   Procedure: LEFT TOTAL KNEE ARTHROPLASTY;  Surgeon: Sheril Coy, MD;  Location: [facility];  Service: Orthopedics;  Laterality: Left;   TUBAL LIGATION     WISDOM TOOTH EXTRACTION     Patient Active Problem List   Diagnosis Date Noted   Preventive measure 11/17/2019   Cancer associated pain 11/03/2019   Class II obesity 05/26/2019   Primary osteoarthritis of left knee 04/15/2019   Inflammatory arthritis 03/14/2018   Neuropathy 01/16/2018   Vitamin D  deficiency 11/11/2017   Hypothyroid 11/09/2017   Total body pain 11/09/2017   Sinusitis 10/11/2017   Mediastinal lymphadenopathy 10/11/2017   Skin rash 10/11/2017   Genetic testing 08/29/2017   Hypothyroidism 08/08/2017   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   Depression 07/30/2017   Encounter for antineoplastic immunotherapy 07/30/2017   Metastasis to lung (HCC) 07/30/2017   Groin pain, right 07/30/2017   Family history of uterine cancer    Left groin pain    Endometrial cancer (HCC)     PCP: Dwight Trula SQUIBB,  MD REFERRING PROVIDER: Dwight Trula SQUIBB, MD  REFERRING DIAG: R42 (ICD-10-CM) - Dizziness and giddiness  THERAPY DIAG:  Dizziness and giddiness  Abnormal posture  Other muscle spasm  Muscle weakness (generalized)  Other abnormalities of gait and mobility  ONSET DATE: 4 weeks (02/13/23)  Rationale for Evaluation and Treatment: Rehabilitation  SUBJECTIVE:   SUBJECTIVE STATEMENT: Pt states she's been having bad headaches and neck pain. Dizziness has been 0/10 throughout the whole week. Pt reports when she was here last time she did just dry needling. Did note a little welting at the needling sites after last time. Headache did go away after  last treatment. Neurologist did increase pt's medication. Pt is agreeable to dry needling but states she is anxious to do any exercises in case it triggers a headache.  Headache: 8/10 Neck pain: 6/10 Dizziness: 0/10  Pt accompanied by: family member (son drives her, pt unable to drive with symptoms)  PERTINENT HISTORY:  Pt notes history of C3, C4, and C5 disc herniations and chronic neck pain 02/15/23 ED notes: past medical history of hypertension, hyperlipidemia, hypothyroidism presenting to the emergency department with dizziness and weakness   PAIN:  Are you having pain? Yes: NPRS scale: 2 to 8/10 Pain location: neck, headache Pain description: sharp/aching Aggravating factors: head movement, light, sounds Relieving factors: Rest, heat, ibuprofen   PRECAUTIONS: Fall  RED FLAGS: None   WEIGHT BEARING RESTRICTIONS: No  FALLS: Has patient fallen in last 6 months? No  LIVING ENVIRONMENT: Lives with: lives with their son Lives in: House/apartment Stairs: No Has following equipment at home: None  PLOF: Independent  PATIENT GOALS: Improve dizziness  OBJECTIVE:  Note: Objective measures were completed at Evaluation unless otherwise noted.  DIAGNOSTIC FINDINGS: n/a  COGNITION: Overall cognitive status: Within functional limits for tasks assessed   SENSATION: WFL  PALPATION:  Increased muscle tone and trigger points notable along L>R upper trap, cervical paraspinals, suboccipitals  DTRs:  Did not assess  POSTURE:  rounded shoulders, forward head, and increased thoracic kyphosis  Cervical ROM:  very slow and guarded with all motion  Active A/PROM (deg) eval  Flexion 100% * (not new)  Extension 100%  Right lateral flexion 80%  Left lateral flexion 60%  Right rotation 100%  Left rotation 50%*  (Blank rows = not tested, * = pain)  STRENGTH: Did not assess UEs today  LOWER EXTREMITY MMT:   MMT Right eval Left eval  Hip flexion    Hip abduction    Hip  adduction    Hip internal rotation    Hip external rotation    Knee flexion    Knee extension    Ankle dorsiflexion    Ankle plantarflexion    Ankle inversion    Ankle eversion    (Blank rows = not tested)  BED MOBILITY:  CGA for safety due to dizziness Slow and guarded with all movements for sidelying<>sit  TRANSFERS: Assistive device utilized: None  Sit to stand: Complete Independence Stand to sit: Complete Independence Chair to chair: Complete Independence Floor: did not assess  GAIT: Gait pattern: WFL, step through pattern, and wide BOS Distance walked: Into clinic Assistive device utilized: None Level of assistance: SBA for safety Comments: Slow and guarded with movements; hands in high guard position  FUNCTIONAL TESTS:  5 times sit to stand: TBA Functional gait assessment: TBA  PATIENT SURVEYS:  FOTO 39; predicted 55  VESTIBULAR ASSESSMENT:  GENERAL OBSERVATION: Guards from cervical motion   SYMPTOM BEHAVIOR:  Subjective history: Reports  neck pain and dizziness occur together (days of dizziness she did not have neck pain)  Non-Vestibular symptoms: neck pain, headaches, tinnitus, and nausea/vomiting  Type of dizziness: Blurred Vision and spinning, diplopia, my head is floating  Frequency: every day until last Friday -- currently at least 2-3x/wk   Duration: all day  Aggravating factors: Induced by position change: sit to stand and Induced by motion: bending down to the ground, turning body quickly, and turning head quickly  Relieving factors: lying supine and rest  Progression of symptoms: unchanged  OCULOMOTOR EXAM:  Ocular Alignment: normal  Ocular ROM: No Limitations  Spontaneous Nystagmus: absent  Gaze-Induced Nystagmus: absent  Smooth Pursuits: intact but reports increase dizziness with looking left  Saccades: slow; difficulty focusing with hypometric movement with looking down  Convergence/Divergence: WNL  FRENZEL - FIXATION SUPRESSED: Did not  assess  VESTIBULAR - OCULAR REFLEX:   Slow VOR: Normal; pulls on neck and increases dizziness  VOR Cancellation: Comment: Unable to turn head to the left with increased dizziness  Head-Impulse Test: HIT Right: negative HIT Left: difficulty assessing due to pt guarding  Dynamic Visual Acuity: Not able to be assessed   POSITIONAL TESTING: Right Sidelying: no nystagmus Left Sidelying: no nystagmus Other: Pt unable to tolerate Dix-Hallpike position due to her limited neck motion; no nystagmus or symptoms noticeable with sidelying Dix-Hallpike   TREATMENT:                                                                                                   Manual Therapy STM and gentle pin and stretch of cervical paraspinals and suboccipitals Skilled assessment and palpation for TPDN Trigger Point Dry Needling  Subsequent Treatment: Instructions provided previously at initial dry needling treatment.   Patient Verbal Consent Given: Yes Education Handout Provided: Previously Provided Muscles Treated: bilat upper cervical paraspinals and L suboccipital Electrical Stimulation Performed: No Treatment Response/Outcome: Decreased muscle tension  Modalities E-stim premod x 15 min bilat UTs, intensity to pt tolerance   PATIENT EDUCATION: Education details: Discussed with pt that dry needling is not a long term solution and if she wants to get full motion in her neck we will have to eventually restart gentle stretching and exercises again to regain that motion and keep the muscles from re tightening Person educated: Patient Education method: Explanation Education comprehension: verbalized understanding and needs further education  HOME EXERCISE PROGRAM: Access Code: [email or web address] URL: [email or web address] Date: 03/06/2023 Prepared by: Sharlet Notaro April Marie Isreal Moline  Exercises - Seated Shoulder Rolls  - 1 x daily - 7 x weekly - 1 sets - 10 reps - Gentle Levator Scapulae Stretch  - 1 x  daily - 7 x weekly - 2 sets - 30 sec hold - Seated Upper Trapezius Stretch (Mirrored)  - 1 x daily - 7 x weekly - 2 sets - 30 sec hold - Seated Cervical Rotation AROM  - 1 x daily - 7 x weekly - 2 sets - 30 sec hold - Supine Suboccipital Release with Tennis Balls  - 1 x daily - 7 x weekly - 1 sets - 1 reps -  5-10 min hold - Seated Assisted Cervical Rotation with Towel (to R side only)  - 1 x daily - 7 x weekly - 1 sets - 3-5 reps - 30 sec hold - Romberg Stance with Eyes Closed  - 1 x daily - 7 x weekly - 1 sets - 3-5 reps - 30 sec hold - Wide Tandem Stance with Eyes Open  - 1 x daily - 7 x weekly - 1 sets - 3-5 reps - 30 sec hold   GOALS: Goals reviewed with patient? Yes  SHORT TERM GOALS: Target date: 04/03/2023   Pt will be ind with initial HEP Baseline: Goal status: IN PROGRESS  2.  Pt will demo L = R cervical ROM Baseline:  03/29/23: pt hesitant to restart exercises to regain neck ROM for fear of triggering a headache Goal status: IN PROGRESS  3.  Pt will report decrease in neck pain and headache by >/=50% for improved muscle guarding Baseline:  Goal status: IN PROGRESS  4.  Pt will report decrease in dizziness by >/=50% Baseline:  Goal status: MET 03/29/23 (has not had any dizziness)  5. Pt will be able to tolerate DGI or FGA to obtain dynamic balance score  Baseline: Unable to assess on eval due to pt guarding, assessed 12/16 and LTG set  Goal status: MET   LONG TERM GOALS: Target date: 05/01/2023   Pt will be ind with management and progression of HEP Baseline:  Goal status: INITIAL  2.  Pt will be able to perform all cervical and oculomotor movements without c/o dizziness Baseline:  Goal status: INITIAL  3.  Pt will improve FGA to 17/30 for decreased fall risk  Baseline: 11/30 (12/16) Goal status: INITIAL  4.  Pt will report full resolution of her dizziness Baseline:  Goal status: MET 03/29/23  5.  Pt will have improved FOTO to >/=55 Baseline:  Goal status:  INITIAL   ASSESSMENT:  CLINICAL IMPRESSION: Pt's dizziness has remained well managed and she states she is no longer feeling dizzy. Headaches and neck pain are her biggest complaints at this time. Pt is fearful of doing gentle exercises as it has triggered headaches; however, educated pt that even though she has a good response to dry needling, it is temporary and that only doing exercises and self management techniques will be the long term solution. Continued dry needling and gentle manual today. Less suboccipital tightness today. Provided e-stim and heat for soreness.    OBJECTIVE IMPAIRMENTS: decreased activity tolerance, decreased balance, decreased coordination, decreased endurance, decreased mobility, decreased ROM, decreased strength, dizziness, hypomobility, increased fascial restrictions, increased muscle spasms, impaired flexibility, impaired UE functional use, improper body mechanics, postural dysfunction, and pain.   ACTIVITY LIMITATIONS: bending, sitting, standing, squatting, sleeping, stairs, transfers, bed mobility, and locomotion level  PARTICIPATION LIMITATIONS: meal prep, cleaning, laundry, driving, shopping, and community activity  PERSONAL FACTORS: Age, Fitness, Past/current experiences, and Time since onset of injury/illness/exacerbation are also affecting patient's functional outcome.   REHAB POTENTIAL: Good  CLINICAL DECISION MAKING: Evolving/moderate complexity  EVALUATION COMPLEXITY: Moderate   PLAN:  PT FREQUENCY: 2x/week  PT DURATION: 6 weeks  PLANNED INTERVENTIONS: 97164- PT Re-evaluation, 97110-Therapeutic exercises, 97530- Therapeutic activity, [id number]- Neuromuscular re-education, 97535- Self Care, [id number]- Manual therapy, [id number]- Gait training, [phone]- Canalith repositioning, 97014- Electrical stimulation (unattended), [phone]- Ionotophoresis 4mg /ml Dexamethasone , Patient/Family education, Balance training, Stair training, Taping, Dry Needling, Joint  mobilization, Spinal mobilization, Vestibular training, Cryotherapy, and Moist heat  PLAN FOR NEXT SESSION: Assess response dry needling. Manual  work/TPDN as indicated. Restart gentle cervical ROM/strengthening if pt agreeable.   Cher Franzoni April Ma L Rutilio Yellowhair, PT 03/29/2023, 8:46 AM

## 2023-04-01 ENCOUNTER — Ambulatory Visit: Payer: Medicare Other | Admitting: Physical Therapy

## 2023-04-01 DIAGNOSIS — M6281 Muscle weakness (generalized): Secondary | ICD-10-CM | POA: Diagnosis not present

## 2023-04-01 DIAGNOSIS — R42 Dizziness and giddiness: Secondary | ICD-10-CM | POA: Diagnosis not present

## 2023-04-01 DIAGNOSIS — M62838 Other muscle spasm: Secondary | ICD-10-CM

## 2023-04-01 DIAGNOSIS — R293 Abnormal posture: Secondary | ICD-10-CM

## 2023-04-01 DIAGNOSIS — R2689 Other abnormalities of gait and mobility: Secondary | ICD-10-CM

## 2023-04-01 NOTE — Therapy (Signed)
 OUTPATIENT PHYSICAL THERAPY VESTIBULAR TREATMENT   Patient Name: Ashley Villegas MRN: [id number] DOB:03-14-1951, 73 y.o., female Today's Date: 04/01/2023  END OF SESSION:  PT End of Session - 04/01/23 0852     Visit Number 7    Number of Visits 12    Date for PT Re-Evaluation 04/17/23    Authorization Type Medicare    Progress Note Due on Visit 10    PT Start Time 0848    PT Stop Time 0930    PT Time Calculation (min) 42 min    Equipment Utilized During Treatment Gait belt    Activity Tolerance Patient limited by pain    Behavior During Therapy [facility] for tasks assessed/performed               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, recurrent    Anxiety    Arthritis    Carpal tunnel syndrome, bilateral upper limbs    Complication of anesthesia    Depression    Family history of uterine cancer    GERD (gastroesophageal reflux disease)    History of radiation therapy 11/08/2016-12/27/2016   pelvis 45 Gy in 25 fractions, pelvis boost 14 gy in 7 fractions   History of radiation therapy    endometrial - pelvis IMRT 11/10/2019-11/27/2019   Hypothyroidism    on synthroid     PONV (postoperative nausea and vomiting)    Past Surgical History:  Procedure Laterality Date   CARPAL TUNNEL RELEASE Bilateral 2020   CESAREAN SECTION     COLONOSCOPY     DIAGNOSTIC LAPAROSCOPY     fibroid tumors on her ovaries    DILATION AND CURETTAGE OF UTERUS     HAND SURGERY Right 2019   carpal tunnel   HYSTEROSCOPY WITH D & C N/A 09/15/2014   Procedure: DILATATION AND CURETTAGE /HYSTEROSCOPY/MYOSURE RESECTION OF POLYP;  Surgeon: Jennifer Ozan, DO;  Location: [facility];  Service: Gynecology;  Laterality: N/A;   KNEE SURGERY     LYMPH NODE DISSECTION N/A 10/26/2014   Procedure: POSS LYMPHADENECTOMY ;  Surgeon: Elenore Simmonds, MD;  Location: [facility];  Service: Gynecology;  Laterality: N/A;   ROBOTIC ASSISTED TOTAL HYSTERECTOMY WITH  BILATERAL SALPINGO OOPHERECTOMY Bilateral 10/26/2014   Procedure: ROBOTIC ASSISTED TOTAL HYSTERECTOMY WITH BILATERAL SALPINGO OOPHORECTOMY;  Surgeon: Elenore Simmonds, MD;  Location: [facility];  Service: Gynecology;  Laterality: Bilateral;   TOTAL KNEE ARTHROPLASTY Left 04/15/2019   Procedure: LEFT TOTAL KNEE ARTHROPLASTY;  Surgeon: Sheril Coy, MD;  Location: [facility];  Service: Orthopedics;  Laterality: Left;   TUBAL LIGATION     WISDOM TOOTH EXTRACTION     Patient Active Problem List   Diagnosis Date Noted   Preventive measure 11/17/2019   Cancer associated pain 11/03/2019   Class II obesity 05/26/2019   Primary osteoarthritis of left knee 04/15/2019   Inflammatory arthritis 03/14/2018   Neuropathy 01/16/2018   Vitamin D  deficiency 11/11/2017   Hypothyroid 11/09/2017   Total body pain 11/09/2017   Sinusitis 10/11/2017   Mediastinal lymphadenopathy 10/11/2017   Skin rash 10/11/2017   Genetic testing 08/29/2017   Hypothyroidism 08/08/2017   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   Depression 07/30/2017   Encounter for antineoplastic immunotherapy 07/30/2017   Metastasis to lung (HCC) 07/30/2017   Groin pain, right 07/30/2017   Family history of uterine cancer    Left groin pain    Endometrial cancer (HCC)     PCP: Raju, Sneha  P, MD REFERRING PROVIDER: Dwight Trula SQUIBB, MD  REFERRING DIAG: R42 (ICD-10-CM) - Dizziness and giddiness  THERAPY DIAG:  No diagnosis found.  ONSET DATE: 4 weeks (02/13/23)  Rationale for Evaluation and Treatment: Rehabilitation  SUBJECTIVE:   SUBJECTIVE STATEMENT: Pt states she had a good response after last treatment. Reports she was able to do 2 of her exercises   Headache: 1/10 just sinus Neck pain: 0/10 Dizziness: 0/10  Pt accompanied by: family member (son drives her, pt unable to drive with symptoms)  PERTINENT HISTORY:  Pt notes history of C3, C4, and C5 disc herniations and chronic neck pain 02/15/23 ED notes: past medical  history of hypertension, hyperlipidemia, hypothyroidism presenting to the emergency department with dizziness and weakness   PAIN:  Are you having pain? Yes: NPRS scale: 2 to 8/10 Pain location: neck, headache Pain description: sharp/aching Aggravating factors: head movement, light, sounds Relieving factors: Rest, heat, ibuprofen   PRECAUTIONS: Fall  RED FLAGS: None   WEIGHT BEARING RESTRICTIONS: No  FALLS: Has patient fallen in last 6 months? No  LIVING ENVIRONMENT: Lives with: lives with their son Lives in: House/apartment Stairs: No Has following equipment at home: None  PLOF: Independent  PATIENT GOALS: Improve dizziness  OBJECTIVE:  Note: Objective measures were completed at Evaluation unless otherwise noted.  DIAGNOSTIC FINDINGS: n/a  COGNITION: Overall cognitive status: Within functional limits for tasks assessed   SENSATION: WFL  PALPATION:  Increased muscle tone and trigger points notable along L>R upper trap, cervical paraspinals, suboccipitals  DTRs:  Did not assess  POSTURE:  rounded shoulders, forward head, and increased thoracic kyphosis  Cervical ROM:  very slow and guarded with all motion  Active A/PROM (deg) eval AROM 04/01/23  Flexion 100% * (not new) 100%  Extension 100% 100%  Right lateral flexion 80% 80%  Left lateral flexion 60% 80%  Right rotation 100% 100%  Left rotation 50%* 100%  (Blank rows = not tested, * = pain)  STRENGTH: Did not assess UEs today  BED MOBILITY:  CGA for safety due to dizziness Slow and guarded with all movements for sidelying<>sit  GAIT: Gait pattern: WFL, step through pattern, and wide BOS Distance walked: Into clinic Assistive device utilized: None Level of assistance: SBA for safety Comments: Slow and guarded with movements; hands in high guard position  FUNCTIONAL TESTS:  5 times sit to stand: TBA Functional gait assessment: TBA  PATIENT SURVEYS:  FOTO 39; predicted 55  VESTIBULAR  ASSESSMENT:  GENERAL OBSERVATION: Guards from cervical motion   SYMPTOM BEHAVIOR:  Subjective history: Reports neck pain and dizziness occur together (days of dizziness she did not have neck pain)  Non-Vestibular symptoms: neck pain, headaches, tinnitus, and nausea/vomiting  Type of dizziness: Blurred Vision and spinning, diplopia, my head is floating  Frequency: every day until last Friday -- currently at least 2-3x/wk   Duration: all day  Aggravating factors: Induced by position change: sit to stand and Induced by motion: bending down to the ground, turning body quickly, and turning head quickly  Relieving factors: lying supine and rest  Progression of symptoms: unchanged  OCULOMOTOR EXAM:  Ocular Alignment: normal  Ocular ROM: No Limitations  Spontaneous Nystagmus: absent  Gaze-Induced Nystagmus: absent  Smooth Pursuits: intact but reports increase dizziness with looking left  Saccades: slow; difficulty focusing with hypometric movement with looking down  Convergence/Divergence: WNL  FRENZEL - FIXATION SUPRESSED: Did not assess  VESTIBULAR - OCULAR REFLEX:   Slow VOR: Normal; pulls on neck and increases dizziness  VOR Cancellation: Comment: Unable to turn head to the left with increased dizziness  Head-Impulse Test: HIT Right: negative HIT Left: difficulty assessing due to pt guarding  Dynamic Visual Acuity: Not able to be assessed   POSITIONAL TESTING: Right Sidelying: no nystagmus Left Sidelying: no nystagmus Other: Pt unable to tolerate Dix-Hallpike position due to her limited neck motion; no nystagmus or symptoms noticeable with sidelying Dix-Hallpike   TREATMENT:                                                                                                   Therex Levator scap stretch x 30 Cervical rotation x10 Scap squeeze x10 Shoulder ER x10 Cervical retraction x5  Self care TENS use at home, provided handout and educated on  precautions  Modalities E-stim premod x 15 min bilat UTs, intensity to pt tolerance with moist heat   PATIENT EDUCATION: Education details: Self care tasks Person educated: Patient Education method: Explanation Education comprehension: verbalized understanding and needs further education  HOME EXERCISE PROGRAM: Access Code: [email or web address] URL: [email or web address] Date: 04/01/2023 Prepared by: Rishawn Walck April Marie Keli Buehner  Exercises - Gentle Levator Scapulae Stretch  - 1 x daily - 7 x weekly - 2 sets - 30 sec hold - Seated Upper Trapezius Stretch (Mirrored)  - 1 x daily - 7 x weekly - 2 sets - 30 sec hold - Supine Chin Tuck  - 2-3 x daily - 7 x weekly - 1 sets - 10 reps - 3 sec hold - Shoulder External Rotation and Scapular Retraction with Resistance  - 1 x daily - 7 x weekly - 2 sets - 10 reps - Shoulder W - External Rotation with Resistance  - 1 x daily - 7 x weekly - 2 sets - 10 reps - Standing Bilateral Low Shoulder Row with Anchored Resistance  - 1 x daily - 7 x weekly - 2 sets - 10 reps - Romberg Stance with Eyes Closed  - 1 x daily - 7 x weekly - 1 sets - 3-5 reps - 30 sec hold - Wide Tandem Stance with Eyes Open  - 1 x daily - 7 x weekly - 1 sets - 3-5 reps - 30 sec hold - Seated Scapular Retraction  - 1 x daily - 7 x weekly - 2 sets - 10 reps   GOALS: Goals reviewed with patient? Yes  SHORT TERM GOALS: Target date: 04/03/2023   Pt will be ind with initial HEP Baseline: Goal status: IN PROGRESS  2.  Pt will demo L = R cervical ROM Baseline:  03/29/23: pt hesitant to restart exercises to regain neck ROM for fear of triggering a headache Goal status: MET 04/01/23  3.  Pt will report decrease in neck pain and headache by >/=50% for improved muscle guarding Baseline:  Goal status: IN PROGRESS  4.  Pt will report decrease in dizziness by >/=50% Baseline:  Goal status: MET 03/29/23 (has not had any dizziness)  5. Pt will be able to tolerate DGI or FGA to obtain  dynamic balance score  Baseline: Unable to assess on eval due  to pt guarding, assessed 12/16 and LTG set  Goal status: MET   LONG TERM GOALS: Target date: 05/01/2023   Pt will be ind with management and progression of HEP Baseline:  Goal status: INITIAL  2.  Pt will be able to perform all cervical and oculomotor movements without c/o dizziness Baseline:  Goal status: INITIAL  3.  Pt will improve FGA to 17/30 for decreased fall risk  Baseline: 11/30 (12/16) Goal status: INITIAL  4.  Pt will report full resolution of her dizziness Baseline:  Goal status: MET 03/29/23  5.  Pt will have improved FOTO to >/=55 Baseline:  Goal status: INITIAL   ASSESSMENT:  CLINICAL IMPRESSION: Pt comes in with much better managed symptoms. Deferred dry needling today as she feels her neck is feeling pretty good. Pt reports she was able to do 2 of her exercises. Treatment focused on trying to gently reintegrate exercises as tolerated. Encouraged pt to continue to do stretches/cervical ROM as tolerated to keep up with her progress. Reviewed gentle postural strengthening exercises with scap squeeze and cervical retraction. Educated pt on TENS use at home so she can use this as a management technique as well. Pt is slowly meeting her STGs.    OBJECTIVE IMPAIRMENTS: decreased activity tolerance, decreased balance, decreased coordination, decreased endurance, decreased mobility, decreased ROM, decreased strength, dizziness, hypomobility, increased fascial restrictions, increased muscle spasms, impaired flexibility, impaired UE functional use, improper body mechanics, postural dysfunction, and pain.   ACTIVITY LIMITATIONS: bending, sitting, standing, squatting, sleeping, stairs, transfers, bed mobility, and locomotion level  PARTICIPATION LIMITATIONS: meal prep, cleaning, laundry, driving, shopping, and community activity  PERSONAL FACTORS: Age, Fitness, Past/current experiences, and Time since onset of  injury/illness/exacerbation are also affecting patient's functional outcome.   REHAB POTENTIAL: Good  CLINICAL DECISION MAKING: Evolving/moderate complexity  EVALUATION COMPLEXITY: Moderate   PLAN:  PT FREQUENCY: 2x/week  PT DURATION: 6 weeks  PLANNED INTERVENTIONS: 97164- PT Re-evaluation, 97110-Therapeutic exercises, 97530- Therapeutic activity, [id number]- Neuromuscular re-education, 97535- Self Care, [id number]- Manual therapy, [id number]- Gait training, [phone]- Canalith repositioning, 97014- Electrical stimulation (unattended), [phone]- Ionotophoresis 4mg /ml Dexamethasone , Patient/Family education, Balance training, Stair training, Taping, Dry Needling, Joint mobilization, Spinal mobilization, Vestibular training, Cryotherapy, and Moist heat  PLAN FOR NEXT SESSION: Assess response dry needling. Manual work/TPDN as indicated. Restart gentle cervical ROM/strengthening if pt agreeable.   Athelene Hursey April Ma L Deo Mehringer, PT 04/01/2023, 8:53 AM

## 2023-04-05 ENCOUNTER — Ambulatory Visit: Payer: Medicare Other | Admitting: Physical Therapy

## 2023-04-05 DIAGNOSIS — R293 Abnormal posture: Secondary | ICD-10-CM | POA: Diagnosis not present

## 2023-04-05 DIAGNOSIS — R2689 Other abnormalities of gait and mobility: Secondary | ICD-10-CM | POA: Diagnosis not present

## 2023-04-05 DIAGNOSIS — M6281 Muscle weakness (generalized): Secondary | ICD-10-CM

## 2023-04-05 DIAGNOSIS — M62838 Other muscle spasm: Secondary | ICD-10-CM

## 2023-04-05 DIAGNOSIS — R42 Dizziness and giddiness: Secondary | ICD-10-CM

## 2023-04-05 NOTE — Therapy (Signed)
 OUTPATIENT PHYSICAL THERAPY VESTIBULAR TREATMENT   Patient Name: Ashley Villegas MRN: [id number] DOB:08/31/1950, 73 y.o., female Today's Date: 04/05/2023  END OF SESSION:  PT End of Session - 04/05/23 0843     Visit Number 8    Number of Visits 12    Date for PT Re-Evaluation 04/17/23    Authorization Type Medicare    Progress Note Due on Visit 10    PT Start Time 0845    PT Stop Time 0925    PT Time Calculation (min) 40 min    Equipment Utilized During Treatment --    Activity Tolerance Patient limited by pain    Behavior During Therapy [facility] for tasks assessed/performed             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, recurrent    Anxiety    Arthritis    Carpal tunnel syndrome, bilateral upper limbs    Complication of anesthesia    Depression    Family history of uterine cancer    GERD (gastroesophageal reflux disease)    History of radiation therapy 11/08/2016-12/27/2016   pelvis 45 Gy in 25 fractions, pelvis boost 14 gy in 7 fractions   History of radiation therapy    endometrial - pelvis IMRT 11/10/2019-11/27/2019   Hypothyroidism    on synthroid     PONV (postoperative nausea and vomiting)    Past Surgical History:  Procedure Laterality Date   CARPAL TUNNEL RELEASE Bilateral 2020   CESAREAN SECTION     COLONOSCOPY     DIAGNOSTIC LAPAROSCOPY     fibroid tumors on her ovaries    DILATION AND CURETTAGE OF UTERUS     HAND SURGERY Right 2019   carpal tunnel   HYSTEROSCOPY WITH D & C N/A 09/15/2014   Procedure: DILATATION AND CURETTAGE /HYSTEROSCOPY/MYOSURE RESECTION OF POLYP;  Surgeon: Delon Prude, DO;  Location: [facility];  Service: Gynecology;  Laterality: N/A;   KNEE SURGERY     LYMPH NODE DISSECTION N/A 10/26/2014   Procedure: POSS LYMPHADENECTOMY ;  Surgeon: Elenore Simmonds, MD;  Location: [facility];  Service: Gynecology;  Laterality: N/A;   ROBOTIC ASSISTED TOTAL HYSTERECTOMY WITH BILATERAL  SALPINGO OOPHERECTOMY Bilateral 10/26/2014   Procedure: ROBOTIC ASSISTED TOTAL HYSTERECTOMY WITH BILATERAL SALPINGO OOPHORECTOMY;  Surgeon: Elenore Simmonds, MD;  Location: [facility];  Service: Gynecology;  Laterality: Bilateral;   TOTAL KNEE ARTHROPLASTY Left 04/15/2019   Procedure: LEFT TOTAL KNEE ARTHROPLASTY;  Surgeon: Sheril Coy, MD;  Location: [facility];  Service: Orthopedics;  Laterality: Left;   TUBAL LIGATION     WISDOM TOOTH EXTRACTION     Patient Active Problem List   Diagnosis Date Noted   Preventive measure 11/17/2019   Cancer associated pain 11/03/2019   Class II obesity 05/26/2019   Primary osteoarthritis of left knee 04/15/2019   Inflammatory arthritis 03/14/2018   Neuropathy 01/16/2018   Vitamin D  deficiency 11/11/2017   Hypothyroid 11/09/2017   Total body pain 11/09/2017   Sinusitis 10/11/2017   Mediastinal lymphadenopathy 10/11/2017   Skin rash 10/11/2017   Genetic testing 08/29/2017   Hypothyroidism 08/08/2017   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   Depression 07/30/2017   Encounter for antineoplastic immunotherapy 07/30/2017   Metastasis to lung (HCC) 07/30/2017   Groin pain, right 07/30/2017   Family history of uterine cancer    Left groin pain    Endometrial cancer (HCC)     PCP: Dwight Trula SQUIBB, MD REFERRING  PROVIDER: Dwight Trula SQUIBB, MD  REFERRING DIAG: R42 (ICD-10-CM) - Dizziness and giddiness  THERAPY DIAG:  Dizziness and giddiness  Abnormal posture  Other muscle spasm  Muscle weakness (generalized)  ONSET DATE: 4 weeks (02/13/23)  Rationale for Evaluation and Treatment: Rehabilitation  SUBJECTIVE:   SUBJECTIVE STATEMENT: Pt states everything has been going good. Feels ready for PT d/c. Reports she has been able to return to all of her normal work and house work.   Headache: 0/10  Neck pain: 0/10 Dizziness: 0/10  Pt accompanied by: family member (son drives her, pt unable to drive with symptoms)  PERTINENT HISTORY:  Pt notes  history of C3, C4, and C5 disc herniations and chronic neck pain 02/15/23 ED notes: past medical history of hypertension, hyperlipidemia, hypothyroidism presenting to the emergency department with dizziness and weakness   PAIN:  Are you having pain? Yes: NPRS scale: 0/10 Pain location: neck, headache Pain description: sharp/aching Aggravating factors: head movement, light, sounds Relieving factors: Rest, heat, ibuprofen   PRECAUTIONS: Fall  RED FLAGS: None   WEIGHT BEARING RESTRICTIONS: No  FALLS: Has patient fallen in last 6 months? No  LIVING ENVIRONMENT: Lives with: lives with their son Lives in: House/apartment Stairs: No Has following equipment at home: None  PLOF: Independent  PATIENT GOALS: Improve dizziness  OBJECTIVE:  Note: Objective measures were completed at Evaluation unless otherwise noted.  DIAGNOSTIC FINDINGS: n/a  COGNITION: Overall cognitive status: Within functional limits for tasks assessed   SENSATION: WFL  PALPATION:  Increased muscle tone and trigger points notable along L>R upper trap, cervical paraspinals, suboccipitals  DTRs:  Did not assess  POSTURE:  rounded shoulders, forward head, and increased thoracic kyphosis  Cervical ROM:  very slow and guarded with all motion  Active A/PROM (deg) eval AROM 04/01/23 AROM 04/05/23  Flexion 100% * (not new) 100% 100%  Extension 100% 100% 100%  Right lateral flexion 80% 80% 90%  Left lateral flexion 60% 80% 100%  Right rotation 100% 100% 100%  Left rotation 50%* 100% 100%  (Blank rows = not tested, * = pain)  STRENGTH: Did not assess UEs today  BED MOBILITY:  CGA for safety due to dizziness Slow and guarded with all movements for sidelying<>sit  GAIT: Gait pattern: WFL, step through pattern, and wide BOS Distance walked: Into clinic Assistive device utilized: None Level of assistance: SBA for safety Comments: Slow and guarded with movements; hands in high guard position  FUNCTIONAL  TESTS:  5 times sit to stand: TBA Functional gait assessment: 11/30  04/05/23: 30/30  PATIENT SURVEYS:  FOTO 39; predicted 55 04/05/23: 70  VESTIBULAR ASSESSMENT:  GENERAL OBSERVATION: Guards from cervical motion   SYMPTOM BEHAVIOR:  Subjective history: Reports neck pain and dizziness occur together (days of dizziness she did not have neck pain)  Non-Vestibular symptoms: neck pain, headaches, tinnitus, and nausea/vomiting  Type of dizziness: Blurred Vision and spinning, diplopia, my head is floating  Frequency: every day until last Friday -- currently at least 2-3x/wk   Duration: all day  Aggravating factors: Induced by position change: sit to stand and Induced by motion: bending down to the ground, turning body quickly, and turning head quickly  Relieving factors: lying supine and rest  Progression of symptoms: unchanged  OCULOMOTOR EXAM:  Ocular Alignment: normal  Ocular ROM: No Limitations  Spontaneous Nystagmus: absent  Gaze-Induced Nystagmus: absent  Smooth Pursuits: intact but reports increase dizziness with looking left   04/05/23: No dizziness  Saccades: slow; difficulty focusing with hypometric movement  with looking down   04/05/23: WNL  Convergence/Divergence: WNL  FRENZEL - FIXATION SUPRESSED: Did not assess  VESTIBULAR - OCULAR REFLEX:   Slow VOR: Normal; pulls on neck and increases dizziness  VOR Cancellation: Comment: Unable to turn head to the left with increased dizziness   04/05/23: WNL  Head-Impulse Test: HIT Right: negative HIT Left: difficulty assessing due to pt guarding  Dynamic Visual Acuity: Not able to be assessed   POSITIONAL TESTING: Right Sidelying: no nystagmus Left Sidelying: no nystagmus Other: Pt unable to tolerate Dix-Hallpike position due to her limited neck motion; no nystagmus or symptoms noticeable with sidelying Dix-Hallpike   TREATMENT:                                                                                                    Rechecked goals for d/c Reviewed self care    PATIENT EDUCATION: Education details: Self care tasks Person educated: Patient Education method: Explanation Education comprehension: verbalized understanding and needs further education  HOME EXERCISE PROGRAM: Access Code: [email or web address] URL: [email or web address] Date: 04/01/2023 Prepared by: Mikel Pyon April Marie Niley Helbig  Exercises - Gentle Levator Scapulae Stretch  - 1 x daily - 7 x weekly - 2 sets - 30 sec hold - Seated Upper Trapezius Stretch (Mirrored)  - 1 x daily - 7 x weekly - 2 sets - 30 sec hold - Supine Chin Tuck  - 2-3 x daily - 7 x weekly - 1 sets - 10 reps - 3 sec hold - Shoulder External Rotation and Scapular Retraction with Resistance  - 1 x daily - 7 x weekly - 2 sets - 10 reps - Shoulder W - External Rotation with Resistance  - 1 x daily - 7 x weekly - 2 sets - 10 reps - Standing Bilateral Low Shoulder Row with Anchored Resistance  - 1 x daily - 7 x weekly - 2 sets - 10 reps - Romberg Stance with Eyes Closed  - 1 x daily - 7 x weekly - 1 sets - 3-5 reps - 30 sec hold - Wide Tandem Stance with Eyes Open  - 1 x daily - 7 x weekly - 1 sets - 3-5 reps - 30 sec hold - Seated Scapular Retraction  - 1 x daily - 7 x weekly - 2 sets - 10 reps   GOALS: Goals reviewed with patient? Yes  SHORT TERM GOALS: Target date: 04/03/2023   Pt will be ind with initial HEP Baseline: Goal status: MET  2.  Pt will demo L = R cervical ROM Baseline:  03/29/23: pt hesitant to restart exercises to regain neck ROM for fear of triggering a headache Goal status: MET 04/01/23  3.  Pt will report decrease in neck pain and headache by >/=50% for improved muscle guarding Baseline:  Goal status: MET  4.  Pt will report decrease in dizziness by >/=50% Baseline:  Goal status: MET 03/29/23 (has not had any dizziness)  5. Pt will be able to tolerate DGI or FGA to obtain dynamic balance score  Baseline: Unable to assess on eval due to pt  guarding, assessed 12/16 and LTG set  Goal status: MET   LONG TERM GOALS: Target date: 05/01/2023   Pt will be ind with management and progression of HEP Baseline:  Goal status: MET  2.  Pt will be able to perform all cervical and oculomotor movements without c/o dizziness Baseline:  Goal status: MET  3.  Pt will improve FGA to 17/30 for decreased fall risk  Baseline: 11/30 (12/16) 30/30 (04/05/23) Goal status: MET  4.  Pt will report full resolution of her dizziness Baseline:  Goal status: MET 03/29/23  5.  Pt will have improved FOTO to >/=55 Baseline:  Goal status: MET   ASSESSMENT:  CLINICAL IMPRESSION: Pt has met all of her short term and long term goals. Pt feels ready for PT d/c and is able to verbalize good understanding of self management of her symptoms at home. Discussed with pt that PT will keep her chart open for a month in case of any exacerbations and then will close out her chart fully in February if she has no further needs. Deferred estim or heat today since she is doing well.    OBJECTIVE IMPAIRMENTS: decreased activity tolerance, decreased balance, decreased coordination, decreased endurance, decreased mobility, decreased ROM, decreased strength, dizziness, hypomobility, increased fascial restrictions, increased muscle spasms, impaired flexibility, impaired UE functional use, improper body mechanics, postural dysfunction, and pain.   ACTIVITY LIMITATIONS: bending, sitting, standing, squatting, sleeping, stairs, transfers, bed mobility, and locomotion level  PARTICIPATION LIMITATIONS: meal prep, cleaning, laundry, driving, shopping, and community activity  PERSONAL FACTORS: Age, Fitness, Past/current experiences, and Time since onset of injury/illness/exacerbation are also affecting patient's functional outcome.   REHAB POTENTIAL: Good  CLINICAL DECISION MAKING: Evolving/moderate complexity  EVALUATION COMPLEXITY: Moderate   PLAN:  PT FREQUENCY:  2x/week  PT DURATION: 6 weeks  PLANNED INTERVENTIONS: 97164- PT Re-evaluation, 97110-Therapeutic exercises, 97530- Therapeutic activity, [id number]- Neuromuscular re-education, 97535- Self Care, 02859- Manual therapy, [id number]- Gait training, [phone]- Canalith repositioning, 97014- Electrical stimulation (unattended), [phone]- Ionotophoresis 4mg /ml Dexamethasone , Patient/Family education, Balance training, Stair training, Taping, Dry Needling, Joint mobilization, Spinal mobilization, Vestibular training, Cryotherapy, and Moist heat  PLAN FOR NEXT SESSION: Assess response dry needling. Manual work/TPDN as indicated. Restart gentle cervical ROM/strengthening if pt agreeable.   Aamilah Augenstein April Ma L Yassin Scales, PT 04/05/2023, 8:43 AM

## 2023-04-22 ENCOUNTER — Ambulatory Visit: Payer: Medicare Other | Admitting: Neurology

## 2023-05-08 ENCOUNTER — Encounter: Payer: Self-pay | Admitting: Physical Therapy

## 2023-05-14 ENCOUNTER — Ambulatory Visit
Admission: RE | Admit: 2023-05-14 | Discharge: 2023-05-14 | Disposition: A | Discharge status: Home / Self Care | Payer: Medicare Other | Source: Ambulatory Visit | Attending: Internal Medicine | Admitting: Internal Medicine

## 2023-05-14 DIAGNOSIS — M8588 Other specified disorders of bone density and structure, other site: Secondary | ICD-10-CM | POA: Diagnosis not present

## 2023-05-14 DIAGNOSIS — Z90722 Acquired absence of ovaries, bilateral: Secondary | ICD-10-CM | POA: Diagnosis not present

## 2023-05-14 DIAGNOSIS — N958 Other specified menopausal and perimenopausal disorders: Secondary | ICD-10-CM | POA: Diagnosis not present

## 2023-05-14 DIAGNOSIS — E2839 Other primary ovarian failure: Secondary | ICD-10-CM | POA: Diagnosis not present

## 2023-05-14 DIAGNOSIS — M8589 Other specified disorders of bone density and structure, multiple sites: Secondary | ICD-10-CM

## 2023-05-22 ENCOUNTER — Telehealth: Payer: Self-pay | Admitting: *Deleted

## 2023-05-22 NOTE — Telephone Encounter (Signed)
 Patient called and scheduled a follow up appt with Dr Pricilla Holm for 6/13 at 3:15 pm. Patient aware

## 2023-06-17 ENCOUNTER — Other Ambulatory Visit: Payer: Self-pay | Admitting: Cardiology

## 2023-07-04 DIAGNOSIS — E119 Type 2 diabetes mellitus without complications: Secondary | ICD-10-CM | POA: Diagnosis not present

## 2023-07-04 DIAGNOSIS — H524 Presbyopia: Secondary | ICD-10-CM | POA: Diagnosis not present

## 2023-07-05 DIAGNOSIS — Z0100 Encounter for examination of eyes and vision without abnormal findings: Secondary | ICD-10-CM | POA: Diagnosis not present

## 2023-07-24 DIAGNOSIS — Z8542 Personal history of malignant neoplasm of other parts of uterus: Secondary | ICD-10-CM | POA: Diagnosis not present

## 2023-07-24 DIAGNOSIS — E7800 Pure hypercholesterolemia, unspecified: Secondary | ICD-10-CM | POA: Diagnosis not present

## 2023-07-24 DIAGNOSIS — F330 Major depressive disorder, recurrent, mild: Secondary | ICD-10-CM | POA: Diagnosis not present

## 2023-07-24 DIAGNOSIS — E039 Hypothyroidism, unspecified: Secondary | ICD-10-CM | POA: Diagnosis not present

## 2023-08-14 ENCOUNTER — Telehealth: Payer: Self-pay

## 2023-08-14 NOTE — Telephone Encounter (Signed)
 Spoke with patient and she will call back to reschedule her appointment on 6/13

## 2023-08-16 ENCOUNTER — Other Ambulatory Visit (HOSPITAL_COMMUNITY): Payer: Self-pay | Admitting: Endocrinology

## 2023-08-16 DIAGNOSIS — E7800 Pure hypercholesterolemia, unspecified: Secondary | ICD-10-CM

## 2023-08-20 ENCOUNTER — Telehealth: Payer: Self-pay

## 2023-08-20 NOTE — Telephone Encounter (Signed)
 Spoke with patient and had her appointment rescheduled from 6/13 with Dr Orvil Bland to 6/11 with Dr Gaylin Ke.  Patient confirmed.

## 2023-09-03 ENCOUNTER — Encounter: Payer: Self-pay | Admitting: Obstetrics & Gynecology

## 2023-09-04 ENCOUNTER — Inpatient Hospital Stay: Attending: Obstetrics & Gynecology | Admitting: Obstetrics & Gynecology

## 2023-09-04 VITALS — BP 126/56 | HR 70 | Temp 98.1°F | Resp 16 | Ht 60.63 in | Wt 168.0 lb

## 2023-09-04 DIAGNOSIS — Z8542 Personal history of malignant neoplasm of other parts of uterus: Secondary | ICD-10-CM | POA: Diagnosis not present

## 2023-09-04 DIAGNOSIS — C541 Malignant neoplasm of endometrium: Secondary | ICD-10-CM

## 2023-09-04 DIAGNOSIS — N904 Leukoplakia of vulva: Secondary | ICD-10-CM

## 2023-09-04 DIAGNOSIS — Z923 Personal history of irradiation: Secondary | ICD-10-CM | POA: Insufficient documentation

## 2023-09-04 MED ORDER — CLOBETASOL PROPIONATE 0.05 % EX CREA: 1.0000 | TOPICAL_CREAM | CUTANEOUS | 3 refills | Status: AC

## 2023-09-04 NOTE — Patient Instructions (Signed)
 Vulvar/Vaginal Moisturizers  Moisturizer Options: Vitamin E oil: pump or capsule form Vitamin E cream (Gene's vitamin E cream) Coconut oil: bottle or bead form Shea butter Blossom Organic Lubricant (organic and all natural; www.blossomorganics.com) PE suppository(coconut oil/vitamin E/palm oil) Desert Harvest Aloe Glide      Consider the ingredients of the product - the fewer the ingredients the better!  Directions for Use: Clean and dry your hands Gently dab the vulvar/vaginal area dry as needed Apply a "pea-sized" amount of the moisturizer onto your fingertip Using you other hand, open the labia   Apply the moisturizer to the vulvar/vaginal tissues Wear loose fitting underwear/clothing if possible following application  Use moisturize 2-3 times daily as desired.   Return as needed or in year

## 2023-09-04 NOTE — Progress Notes (Signed)
 Follow Up Note: Gyn-Onc  Ashley Villegas 73 y.o. female  CC: Surveillance   HPI: The oncology history was reviewed.  Interval History: She denies any vaginal bleeding, abdominal/pelvic pain, cough, lethargy or increasing abdominal girth. She was seen in follow up by Dr. Eloise Hake in 12/24. At that visit she was clinically felt to be NED.   Review of Systems  Review of Systems  Constitutional:  Negative for malaise/fatigue and weight loss.  Respiratory:  Negative for shortness of breath and wheezing.   Cardiovascular:  Negative for chest pain and leg swelling.  Gastrointestinal:  Negative for abdominal pain, blood in stool, constipation, nausea and vomiting.  Genitourinary:  Negative for dysuria, frequency, hematuria and urgency.  Musculoskeletal:  Negative for joint pain and myalgias.  Neurological:  Negative for weakness.  Psychiatric/Behavioral:  Negative for depression. The patient does not have insomnia.    Current medications, allergy, social history, past surgical history, past medical history, family history were all reviewed.    Vitals:  BP (!) 126/56 (BP Location: Left Arm, Patient Position: Sitting)   Pulse 70   Temp 98.1 F (36.7 C) (Oral)   Resp 16   Ht 5' 0.63 (1.54 m)   Wt 168 lb (76.2 kg)   SpO2 99%   BMI 32.13 kg/m    Physical Exam:  Physical Exam Exam conducted with a chaperone present.  Constitutional:      General: She is not in acute distress. Cardiovascular:     Rate and Rhythm: Normal rate and regular rhythm.  Pulmonary:     Effort: Pulmonary effort is normal.     Breath sounds: Normal breath sounds. No wheezing or rhonchi.  Abdominal:     Palpations: Abdomen is soft.     Tenderness: There is no abdominal tenderness. There is no right CVA tenderness or left CVA tenderness.     Hernia: No hernia is present.  Genitourinary:    General: Normal vulva.     Urethra: No urethral lesion.     Vagina: No lesions. No bleeding Musculoskeletal:      Cervical back: Neck supple.     Right lower leg: No edema.     Left lower leg: No edema.  Lymphadenopathy:     Upper Body:     Right upper body: No supraclavicular adenopathy.     Left upper body: No supraclavicular adenopathy.     Lower Body: No right inguinal adenopathy. No left inguinal adenopathy.  Skin:    Findings: No rash.  Neurological:     Mental Status: She is oriented to person, place, and time.   Assessment/Plan:   Endometrial cancer (HCC) Ashley Villegas is a 73 y.o. woman with recurrent low risk uterine cancer, initially with vaginal cuff recurrence in 2018, most recently treated for para-vaginal recurrence in 2021 with RT with complete disease resolution, presenting for surveillance visit.    Negative symptom review, normal exam.  No evidence of recurrence    -Continue follow up every 6 months.  She sees Dr. Eloise Hake in December. We will see her back next June.    I personally spent 25 minutes face-to-face and non-face-to-face in the care of this patient, which includes all pre, intra, and post visit time on the date of service.   Abdul Hodgkin, MD

## 2023-09-04 NOTE — Assessment & Plan Note (Addendum)
 Ashley Villegas is a 73 y.o. woman with recurrent low risk uterine cancer, initially with vaginal cuff recurrence in 2018, most recently treated for para-vaginal recurrence in 2021 with RT with complete disease resolution, presenting for surveillance visit.    Negative symptom review, normal exam.  No evidence of recurrence    -Continue follow up every 6 months.  She sees Dr. Eloise Hake in December. We will see her back next June.

## 2023-09-05 ENCOUNTER — Encounter: Payer: Self-pay | Admitting: Obstetrics & Gynecology

## 2023-09-06 ENCOUNTER — Ambulatory Visit: Payer: Medicare Other | Admitting: Gynecologic Oncology

## 2023-09-06 ENCOUNTER — Ambulatory Visit (HOSPITAL_COMMUNITY)
Admission: RE | Admit: 2023-09-06 | Discharge: 2023-09-06 | Disposition: A | Discharge status: Home / Self Care | Payer: Self-pay | Source: Ambulatory Visit | Attending: Endocrinology | Admitting: Endocrinology

## 2023-09-06 DIAGNOSIS — E7800 Pure hypercholesterolemia, unspecified: Secondary | ICD-10-CM | POA: Insufficient documentation

## 2024-03-22 NOTE — Progress Notes (Signed)
 "  Radiation Oncology         [phone] ________________________________  Name: Ashley Villegas MRN: [id number]  Date: 03/23/2024  DOB: 1951/02/22  Follow-Up Visit Note  CC: Dwight Trula SQUIBB, MD  Signa Rush, MD  No diagnosis found.  Diagnosis: Recurrent endometrial cancer, original stage IA grade 1, s/p hysterectomy  with enhancing lesion centered along the right levator ani muscle, status post additional radiation therapy   Interval Since Last Radiation: 4 years, 3 months, and 26 days   Radiation Treatment Dates: 11/10/2019 through 11/27/2019 Site Technique Total Dose (Gy) Dose per Fx (Gy) Completed Fx Beam Energies  Perineum: Pelvis IMRT 28/28 2 14/14 6X    Narrative:  The patient returns today for a routine annual follow-up visit. She was last seen here for follow-up on 03/25/23.        Since her last visit, she followed up with Dr. Rogelio (gyn-onc) on 09/04/23. She was noted to be doing well and NED on examination at that time.    Pertinent/recent imaging performed in the interval since her last visit includes:   -- Bilateral screening mammogram on 03/29/23 which demonstrated no evidence of malignancy in either breast.        -- MRI of the cervical spine on 03/20/24 (to evaluate neck pain and headaches) demonstrated moderate to severe left and mild right neural foraminal narrowing at C5-C6, and mild left neural foraminal narrowing at C3-C4.    -- Bone density scan on 05/14/23 demonstrated findings consistent with osteopenia.         -- Cardiac scoring chest CT on 0613/25 showed a coronary calcium  score of 0, indicative of a low risk for future cardiac events. An over read chest CT report was also ordered on this study which showed no acute extracardiac findings.      No other significant interval history since the patient was last seen for follow-up.   ***        Allergies:  is allergic to oxycodone , hydrocodone , and hydrocodone -acetaminophen .  Meds: Current  Outpatient Medications  Medication Sig Dispense Refill   albuterol (VENTOLIN HFA) 108 (90 Base) MCG/ACT inhaler SMARTSIG:2 Puff(s) Via Inhaler 4 Times Daily PRN     buPROPion (WELLBUTRIN SR) 150 MG 12 hr tablet Take 150 mg by mouth once.     Cholecalciferol (VITAMIN D3) 25 MCG (1000 UT) CAPS Take 1,000 Units by mouth daily.     clobetasol  cream (TEMOVATE ) 0.05 % Apply 1 Application topically 2 (two) times a week. 30 g 3   FLUoxetine  (PROZAC ) 40 MG capsule Take 40 mg by mouth daily. Per patient, not taken in several months     furosemide (LASIX) 40 MG tablet Take 40 mg by mouth 2 (two) times daily.     levothyroxine  (SYNTHROID ) 88 MCG tablet TAKE 1 TABLET BY MOUTH EVERY DAY IN THE MORNING ON AN EMPTY STOMACH FOR 30 DAYS     metFORMIN (GLUCOPHAGE) 500 MG tablet Take 500 mg by mouth daily with breakfast.     No current facility-administered medications for this encounter.    Physical Findings: The patient is in no acute distress. Patient is alert and oriented.  vitals were not taken for this visit. .  No significant changes. Lungs are clear to auscultation bilaterally. Heart has regular rate and rhythm. No palpable cervical, supraclavicular, or axillary adenopathy. Abdomen soft, non-tender, normal bowel sounds.  On pelvic examination the external genitalia were unremarkable. A speculum exam was performed. There are no mucosal lesions noted in  the vaginal vault. A Pap smear was obtained of the proximal vagina. On bimanual and rectovaginal examination there were no pelvic masses appreciated. ***   Lab Findings: Lab Results  Component Value Date   WBC 5.3 02/15/2023   HGB 13.5 02/15/2023   HCT 40.7 02/15/2023   MCV 93.8 02/15/2023   PLT 223 02/15/2023    Radiographic Findings: No results found.  Impression: Recurrent endometrial cancer, original stage IA grade 1, s/p hysterectomy  with enhancing lesion centered along the right levator ani muscle, status post additional radiation therapy    The patient is recovering from the effects of radiation.  ***  Plan:  ***   *** minutes of total time was spent for this patient encounter, including preparation, face-to-face counseling with the patient and coordination of care, physical exam, and documentation of the encounter. ____________________________________  Lynwood CHARM Nasuti, PhD, MD  This document serves as a record of services personally performed by Lynwood Nasuti, MD. It was created on his behalf by Dorthy Fuse, a trained medical scribe. The creation of this record is based on the scribe's personal observations and the provider's statements to them. This document has been checked and approved by the attending provider.  "

## 2024-03-23 ENCOUNTER — Telehealth: Payer: Self-pay | Admitting: *Deleted

## 2024-03-23 ENCOUNTER — Ambulatory Visit
Admission: RE | Admit: 2024-03-23 | Discharge: 2024-03-23 | Disposition: A | Discharge status: Home / Self Care | Payer: Self-pay | Source: Ambulatory Visit | Attending: Radiation Oncology | Admitting: Radiation Oncology

## 2024-03-23 ENCOUNTER — Encounter: Payer: Self-pay | Admitting: Radiation Oncology

## 2024-03-23 VITALS — BP 139/62 | HR 69 | Temp 97.2°F | Resp 16 | Ht 60.0 in | Wt 170.0 lb

## 2024-03-23 DIAGNOSIS — Z923 Personal history of irradiation: Secondary | ICD-10-CM | POA: Insufficient documentation

## 2024-03-23 DIAGNOSIS — L299 Pruritus, unspecified: Secondary | ICD-10-CM | POA: Diagnosis not present

## 2024-03-23 DIAGNOSIS — Z79890 Hormone replacement therapy (postmenopausal): Secondary | ICD-10-CM | POA: Diagnosis not present

## 2024-03-23 DIAGNOSIS — Z79899 Other long term (current) drug therapy: Secondary | ICD-10-CM | POA: Diagnosis not present

## 2024-03-23 DIAGNOSIS — C541 Malignant neoplasm of endometrium: Secondary | ICD-10-CM

## 2024-03-23 DIAGNOSIS — Z8542 Personal history of malignant neoplasm of other parts of uterus: Secondary | ICD-10-CM | POA: Diagnosis present

## 2024-03-23 DIAGNOSIS — Z7984 Long term (current) use of oral hypoglycemic drugs: Secondary | ICD-10-CM | POA: Diagnosis not present

## 2024-03-23 MED ORDER — CLOBETASOL PROPIONATE 0.05 % EX CREA: 1.0000 | TOPICAL_CREAM | Freq: Two times a day (BID) | CUTANEOUS | 0 refills | Status: AC

## 2024-03-23 NOTE — Progress Notes (Signed)
 Ashley Villegas is here today for follow up post radiation to the pelvis.  They completed their radiation on: 11/27/19   Does the patient complain of any of the following:  Pain: No Abdominal bloating: at times after eating. Diarrhea/Constipation: No Nausea/Vomiting: No Vaginal Discharge: No Blood in Urine or Stool: No Urinary Issues (dysuria/incomplete emptying/ incontinence/ increased frequency/urgency): No Does patient report using vaginal dilator 2-3 times a week and/or sexually active 2-3 weeks: No Post radiation skin changes: Yes, reports itching to vagina.    Additional comments if applicable:   BP 139/62 (BP Location: Left Arm, Patient Position: Sitting)   Pulse 69   Temp (!) 97.2 F (36.2 C) (Temporal)   Resp 16   Ht 5' (1.524 m)   Wt 170 lb (77.1 kg)   SpO2 99%   BMI 33.20 kg/m

## 2024-03-23 NOTE — Telephone Encounter (Signed)
 Ashley Villegas from radiation sent in basket message regarding the patient follow up appt with GYN ONC. Patient scheduled to see Dr Rogelio on 6/12 at 9 am. Patient aware

## 2024-09-02 ENCOUNTER — Inpatient Hospital Stay: Admitting: Obstetrics & Gynecology
